# Patient Record
Sex: Female | Born: 1935 | Hispanic: No | Smoking: Never smoker
Health system: Southern US, Community
[De-identification: ages and names within clinical notes are randomized; demographics above are authoritative.]

## PROBLEM LIST (undated history)

## (undated) DIAGNOSIS — A159 Respiratory tuberculosis unspecified: Secondary | ICD-10-CM

## (undated) DIAGNOSIS — E7800 Pure hypercholesterolemia, unspecified: Secondary | ICD-10-CM

## (undated) DIAGNOSIS — R634 Abnormal weight loss: Secondary | ICD-10-CM

## (undated) DIAGNOSIS — F329 Major depressive disorder, single episode, unspecified: Secondary | ICD-10-CM

## (undated) DIAGNOSIS — E039 Hypothyroidism, unspecified: Secondary | ICD-10-CM

## (undated) DIAGNOSIS — U071 COVID-19: Secondary | ICD-10-CM

## (undated) DIAGNOSIS — C069 Malignant neoplasm of mouth, unspecified: Secondary | ICD-10-CM

## (undated) DIAGNOSIS — F32A Depression, unspecified: Secondary | ICD-10-CM

## (undated) DIAGNOSIS — M4800 Spinal stenosis, site unspecified: Secondary | ICD-10-CM

## (undated) DIAGNOSIS — I10 Essential (primary) hypertension: Secondary | ICD-10-CM

## (undated) HISTORY — PX: LIPOMA EXCISION: SHX5283

## (undated) HISTORY — PX: OTHER SURGICAL HISTORY: SHX169

## (undated) HISTORY — DX: Malignant neoplasm of mouth, unspecified: C06.9

## (undated) HISTORY — PX: TOOTH EXTRACTION: SUR596

## (undated) HISTORY — PX: COLONOSCOPY: SHX174

## (undated) HISTORY — PX: EYE SURGERY: SHX253

## (undated) HISTORY — PX: BACK SURGERY: SHX140

---

## 2000-01-05 ENCOUNTER — Encounter: Payer: Self-pay | Admitting: Obstetrics and Gynecology

## 2000-01-05 ENCOUNTER — Ambulatory Visit (HOSPITAL_COMMUNITY): Admission: RE | Admit: 2000-01-05 | Discharge: 2000-01-05 | Payer: Self-pay | Admitting: *Deleted

## 2000-01-08 ENCOUNTER — Ambulatory Visit (HOSPITAL_COMMUNITY): Admission: RE | Admit: 2000-01-08 | Discharge: 2000-01-08 | Payer: Self-pay | Admitting: Family Medicine

## 2000-01-08 ENCOUNTER — Encounter: Payer: Self-pay | Admitting: Family Medicine

## 2000-03-22 ENCOUNTER — Encounter: Payer: Self-pay | Admitting: Family Medicine

## 2000-03-22 ENCOUNTER — Ambulatory Visit (HOSPITAL_COMMUNITY): Admission: RE | Admit: 2000-03-22 | Discharge: 2000-03-22 | Payer: Self-pay | Admitting: *Deleted

## 2000-04-01 ENCOUNTER — Encounter: Admission: RE | Admit: 2000-04-01 | Discharge: 2000-04-01 | Payer: Self-pay | Admitting: Surgery

## 2000-04-01 ENCOUNTER — Encounter: Payer: Self-pay | Admitting: Surgery

## 2000-04-09 ENCOUNTER — Emergency Department (HOSPITAL_COMMUNITY): Admission: EM | Admit: 2000-04-09 | Discharge: 2000-04-09 | Payer: Self-pay

## 2000-04-27 ENCOUNTER — Ambulatory Visit (HOSPITAL_COMMUNITY): Admission: RE | Admit: 2000-04-27 | Discharge: 2000-04-27 | Payer: Self-pay | Admitting: Family Medicine

## 2000-04-27 ENCOUNTER — Encounter: Payer: Self-pay | Admitting: Family Medicine

## 2000-05-27 ENCOUNTER — Encounter: Payer: Self-pay | Admitting: Cardiovascular Disease

## 2000-05-27 ENCOUNTER — Encounter: Admission: RE | Admit: 2000-05-27 | Discharge: 2000-05-27 | Payer: Self-pay | Admitting: Cardiovascular Disease

## 2002-09-27 ENCOUNTER — Other Ambulatory Visit: Admission: RE | Admit: 2002-09-27 | Discharge: 2002-09-27 | Payer: Self-pay | Admitting: Family Medicine

## 2002-10-05 ENCOUNTER — Encounter: Payer: Self-pay | Admitting: Family Medicine

## 2002-10-05 ENCOUNTER — Encounter: Admission: RE | Admit: 2002-10-05 | Discharge: 2002-10-05 | Payer: Self-pay | Admitting: Family Medicine

## 2002-12-11 ENCOUNTER — Encounter: Payer: Self-pay | Admitting: Emergency Medicine

## 2002-12-11 ENCOUNTER — Emergency Department (HOSPITAL_COMMUNITY): Admission: EM | Admit: 2002-12-11 | Discharge: 2002-12-11 | Payer: Self-pay | Admitting: Emergency Medicine

## 2008-06-05 ENCOUNTER — Encounter: Admission: RE | Admit: 2008-06-05 | Discharge: 2008-06-05 | Payer: Self-pay | Admitting: Family Medicine

## 2008-08-26 ENCOUNTER — Emergency Department (HOSPITAL_COMMUNITY): Admission: EM | Admit: 2008-08-26 | Discharge: 2008-08-26 | Payer: Self-pay | Admitting: Emergency Medicine

## 2008-08-29 ENCOUNTER — Encounter (HOSPITAL_COMMUNITY): Admission: RE | Admit: 2008-08-29 | Discharge: 2008-11-27 | Payer: Self-pay | Admitting: Emergency Medicine

## 2009-02-11 ENCOUNTER — Encounter: Admission: RE | Admit: 2009-02-11 | Discharge: 2009-02-11 | Payer: Self-pay | Admitting: Neurology

## 2010-05-17 DIAGNOSIS — C069 Malignant neoplasm of mouth, unspecified: Secondary | ICD-10-CM

## 2010-05-17 HISTORY — DX: Malignant neoplasm of mouth, unspecified: C06.9

## 2011-06-18 ENCOUNTER — Emergency Department (HOSPITAL_COMMUNITY)
Admission: EM | Admit: 2011-06-18 | Discharge: 2011-06-19 | Disposition: A | Discharge status: Home / Self Care | Payer: Medicare Other | Attending: Emergency Medicine | Admitting: Emergency Medicine

## 2011-06-18 ENCOUNTER — Encounter (HOSPITAL_COMMUNITY): Payer: Self-pay | Admitting: *Deleted

## 2011-06-18 ENCOUNTER — Emergency Department (HOSPITAL_COMMUNITY): Payer: Medicare Other

## 2011-06-18 DIAGNOSIS — E119 Type 2 diabetes mellitus without complications: Secondary | ICD-10-CM | POA: Insufficient documentation

## 2011-06-18 DIAGNOSIS — M542 Cervicalgia: Secondary | ICD-10-CM | POA: Insufficient documentation

## 2011-06-18 DIAGNOSIS — W1809XA Striking against other object with subsequent fall, initial encounter: Secondary | ICD-10-CM | POA: Insufficient documentation

## 2011-06-18 DIAGNOSIS — S0003XA Contusion of scalp, initial encounter: Secondary | ICD-10-CM | POA: Insufficient documentation

## 2011-06-18 DIAGNOSIS — I10 Essential (primary) hypertension: Secondary | ICD-10-CM | POA: Insufficient documentation

## 2011-06-18 DIAGNOSIS — IMO0002 Reserved for concepts with insufficient information to code with codable children: Secondary | ICD-10-CM | POA: Insufficient documentation

## 2011-06-18 DIAGNOSIS — N390 Urinary tract infection, site not specified: Secondary | ICD-10-CM | POA: Insufficient documentation

## 2011-06-18 DIAGNOSIS — E7800 Pure hypercholesterolemia, unspecified: Secondary | ICD-10-CM | POA: Insufficient documentation

## 2011-06-18 DIAGNOSIS — Z79899 Other long term (current) drug therapy: Secondary | ICD-10-CM | POA: Insufficient documentation

## 2011-06-18 DIAGNOSIS — R51 Headache: Secondary | ICD-10-CM | POA: Insufficient documentation

## 2011-06-18 HISTORY — DX: Pure hypercholesterolemia, unspecified: E78.00

## 2011-06-18 HISTORY — DX: Essential (primary) hypertension: I10

## 2011-06-18 NOTE — ED Notes (Signed)
Bed:WA20<BR> Expected date:<BR> Expected time:<BR> Means of arrival:<BR> Comments:<BR> Hold for triage 1

## 2011-06-18 NOTE — ED Notes (Signed)
Patient aware of need for urine testing. Patient unable to void at this time. Patient encouraged to call for toileting assistance Patient is in pain.

## 2011-06-18 NOTE — ED Notes (Signed)
Family at bedside. 

## 2011-06-18 NOTE — ED Notes (Signed)
Pt fell on way to the bathroom at home. Pt does not remember falling. Pt struck top of head on skirt board and cut L elbow on heating grate. No n/v reported. Positive loss of consciousness reported.

## 2011-06-19 ENCOUNTER — Other Ambulatory Visit: Payer: Self-pay

## 2011-06-19 LAB — URINALYSIS, ROUTINE W REFLEX MICROSCOPIC
Hgb urine dipstick: NEGATIVE
Specific Gravity, Urine: 1.025 (ref 1.005–1.030)
Urobilinogen, UA: 0.2 mg/dL (ref 0.0–1.0)

## 2011-06-19 LAB — BASIC METABOLIC PANEL
CO2: 26 mEq/L (ref 19–32)
Chloride: 101 mEq/L (ref 96–112)
Glucose, Bld: 204 mg/dL — ABNORMAL HIGH (ref 70–99)
Potassium: 4.4 mEq/L (ref 3.5–5.1)
Sodium: 138 mEq/L (ref 135–145)

## 2011-06-19 LAB — DIFFERENTIAL
Basophils Absolute: 0 10*3/uL (ref 0.0–0.1)
Eosinophils Relative: 9 % — ABNORMAL HIGH (ref 0–5)
Lymphocytes Relative: 31 % (ref 12–46)
Lymphs Abs: 3.4 10*3/uL (ref 0.7–4.0)
Neutro Abs: 6 10*3/uL (ref 1.7–7.7)
Neutrophils Relative %: 54 % (ref 43–77)

## 2011-06-19 LAB — CBC
MCV: 82.2 fL (ref 78.0–100.0)
Platelets: 274 10*3/uL (ref 150–400)
RBC: 3.99 MIL/uL (ref 3.87–5.11)
WBC: 11.2 10*3/uL — ABNORMAL HIGH (ref 4.0–10.5)

## 2011-06-19 MED ORDER — CIPROFLOXACIN HCL 500 MG PO TABS
500.0000 mg | ORAL_TABLET | Freq: Once | ORAL | Status: AC
  Administered 2011-06-19: 500 mg via ORAL
  Filled 2011-06-19: qty 1

## 2011-06-19 MED ORDER — PREDNISONE 20 MG PO TABS: 60.0000 mg | ORAL_TABLET | Freq: Once | ORAL | Status: DC

## 2011-06-19 MED ORDER — PREDNISONE 20 MG PO TABS
ORAL_TABLET | ORAL | Status: AC
  Filled 2011-06-19: qty 3

## 2011-06-19 MED ORDER — CIPROFLOXACIN HCL 500 MG PO TABS: 500.0000 mg | ORAL_TABLET | Freq: Two times a day (BID) | ORAL | Status: AC

## 2011-06-19 MED ORDER — HYDROCODONE-ACETAMINOPHEN 5-325 MG PO TABS
1.0000 | ORAL_TABLET | Freq: Once | ORAL | Status: AC
  Administered 2011-06-19: 1 via ORAL
  Filled 2011-06-19: qty 1

## 2011-06-19 NOTE — ED Provider Notes (Signed)
History     CSN: [id number]  Arrival date & time 06/18/11  2149   First MD Initiated Contact with Patient 06/18/11 2257      Chief Complaint  Patient presents with  . Fall  . Head Injury    positive LoC    (Consider location/radiation/quality/duration/timing/severity/associated sxs/prior treatment) HPI Hannah [name] is a 78 y.o. female presents with c/o fall leading to desire to be assessed in the ED. The sx(s) have been present for 1 hour. Additional concerns are headache, neck pain, soreness of shoulders, and laceration of the left elbow. Causative factors are not known. Palliative factors are nothing was tried. The distress associated is moderate. The disorder has been present for 1 hour.    Past Medical History  Diagnosis Date  . Diabetes mellitus   . Hypertension   . Hypercholesteremia     History reviewed. No pertinent past surgical history.  History reviewed. No pertinent family history.  History  Substance Use Topics  . Smoking status: Never Smoker   . Smokeless tobacco: Not on file  . Alcohol Use: No    OB History    Grav Para Term Preterm Abortions TAB SAB Ect Mult Living                  Review of Systems  All other systems reviewed and are negative.    Allergies  Penicillins and Sulfa antibiotics  Home Medications   Current Outpatient Rx  Name Route Sig Dispense Refill  . AMLODIPINE BESYLATE 5 MG PO TABS Oral Take 5 mg by mouth daily.    . ASPIRIN EC 81 MG PO TBEC Oral Take 81 mg by mouth daily.    Marland Kitchen EZETIMIBE-SIMVASTATIN 10-20 MG PO TABS Oral Take 1 tablet by mouth at bedtime.    Marland Kitchen METFORMIN HCL 500 MG PO TABS Oral Take 500 mg by mouth 2 (two) times daily with a meal.    . CIPROFLOXACIN HCL 500 MG PO TABS Oral Take 1 tablet (500 mg total) by mouth every 12 (twelve) hours. 10 tablet 0    BP 126/72  Pulse 72  Temp(Src) 98.2 F (36.8 C) (Oral)  Resp 20  SpO2 97%  Physical Exam  Nursing note and vitals reviewed. Constitutional: She  is oriented to person, place, and time. She appears well-developed and well-nourished.  HENT:  Head: Normocephalic and atraumatic.       Small left parietal contusion with very minimal nonbleeding abrasion.  Eyes: Conjunctivae and EOM are normal. Pupils are equal, round, and reactive to light.  Neck: Normal range of motion and phonation normal. Neck supple.  Cardiovascular: Normal rate, regular rhythm and intact distal pulses.   Pulmonary/Chest: Effort normal and breath sounds normal. She has no rales. She exhibits no tenderness.  Abdominal: Soft. She exhibits no distension. There is no tenderness. There is no guarding.  Musculoskeletal: Normal range of motion. She exhibits no edema and no tenderness.       Small abrasion, left elbow, nonbleeding. Bilateral posterior shoulder, tenderness, mild- no deformities  Neurological: She is alert and oriented to person, place, and time. She has normal strength. No cranial nerve deficit. She exhibits normal muscle tone. Coordination normal.  Skin: Skin is warm and dry.  Psychiatric: She has a normal mood and affect. Her behavior is normal. Judgment and thought content normal.    ED Course  Procedures (including critical care time)  Date: 06/19/2011  Rate: 69  Rhythm: normal sinus rhythm  QRS Axis: normal  Intervals: normal  ST/T Wave abnormalities: nonspecific T wave changes  Conduction Disutrbances:none  Narrative Interpretation:   Old EKG Reviewed: unchanged  Labs Reviewed  URINALYSIS, ROUTINE W REFLEX MICROSCOPIC - Abnormal; Notable for the following:    APPearance CLOUDY (*)    Glucose, UA 100 (*)    Leukocytes, UA MODERATE (*)    All other components within normal limits  CBC - Abnormal; Notable for the following:    WBC 11.2 (*)    Hemoglobin 10.8 (*)    HCT 32.8 (*)    All other components within normal limits  DIFFERENTIAL - Abnormal; Notable for the following:    Eosinophils Relative 9 (*)    Eosinophils Absolute 1.0 (*)     All other components within normal limits  BASIC METABOLIC PANEL - Abnormal; Notable for the following:    Glucose, Bld 204 (*)    GFR calc non Af Amer 85 (*)    All other components within normal limits  URINE MICROSCOPIC-ADD ON - Abnormal; Notable for the following:    Squamous Epithelial / LPF FEW (*)    Bacteria, UA FEW (*)    All other components within normal limits  URINE CULTURE   Ct Head Wo Contrast  06/18/2011  *RADIOLOGY REPORT*  Clinical Data: Head and neck pain secondary to a fall today.  Loss of consciousness.  CT HEAD WITHOUT CONTRAST  Technique:  Contiguous axial images were obtained from the base of the skull through the vertex without contrast.  Comparison: 07/14/2006  Findings: There is no acute intracranial infarction, hemorrhage, or mass.  No significant atrophy.  Osseous structures are normal. Ventricles are not dilated.  IMPRESSION: Normal exam.  Original Report Authenticated By: Gwynn Burly, M.D.   Ct Cervical Spine Wo Contrast  06/18/2011  *RADIOLOGY REPORT*  Clinical Data: Neck pain secondary to a fall today.  CT CERVICAL SPINE WITHOUT CONTRAST  Technique:  Multidetector CT imaging of the cervical spine was performed. Multiplanar CT image reconstructions were also generated.  Comparison: None.  Findings: There is no fracture, subluxation, prevertebral soft tissue swelling, or other acute abnormality.  Multilevel mild degenerative disc disease.  IMPRESSION: No acute abnormalities of the cervical spine.  Original Report Authenticated By: Gwynn Burly, M.D.   0200-  Reevaluation with update and discussion. After initial assessment and treatment, an updated evaluation reveals Collar removed. Good ROM neck.Mancel Bale L    ED Treatment: Cipro PO  1. UTI (lower urinary tract infection)       MDM  Fall with head injury and apparent UTI. Pt stable for d/c. Doubt ACS, metabolic instability, SBI.      Flint Melter, MD 06/19/11 [phone]

## 2011-06-20 LAB — URINE CULTURE
Colony Count: 35000
Culture  Setup Time: 201302020406

## 2011-06-21 NOTE — ED Notes (Signed)
Chart sent to EDP office for review °

## 2011-06-23 NOTE — ED Notes (Signed)
Chart reviewed by  Fayrene Helper and he request rx for  Cipro treatment and follow up w/ PCP to ensure resolution of UTI

## 2011-06-23 NOTE — ED Notes (Signed)
Patient notified. Talked with Patient's son after getting consent.

## 2012-03-28 ENCOUNTER — Other Ambulatory Visit: Payer: Self-pay | Admitting: Otolaryngology

## 2012-03-28 DIAGNOSIS — IMO0002 Reserved for concepts with insufficient information to code with codable children: Secondary | ICD-10-CM

## 2012-03-29 ENCOUNTER — Ambulatory Visit
Admission: RE | Admit: 2012-03-29 | Discharge: 2012-03-29 | Disposition: A | Discharge status: Home / Self Care | Payer: Medicare Other | Source: Ambulatory Visit | Attending: Otolaryngology | Admitting: Otolaryngology

## 2012-03-29 DIAGNOSIS — IMO0002 Reserved for concepts with insufficient information to code with codable children: Secondary | ICD-10-CM

## 2012-03-29 MED ORDER — IOHEXOL 300 MG/ML  SOLN
75.0000 mL | Freq: Once | INTRAMUSCULAR | Status: AC | PRN
  Administered 2012-03-29: 75 mL via INTRAVENOUS

## 2012-03-30 ENCOUNTER — Ambulatory Visit
Admission: RE | Admit: 2012-03-30 | Discharge: 2012-03-30 | Disposition: A | Discharge status: Home / Self Care | Payer: Medicare Other | Source: Ambulatory Visit | Attending: Neurology | Admitting: Neurology

## 2012-03-30 ENCOUNTER — Other Ambulatory Visit: Payer: Self-pay | Admitting: Neurology

## 2012-03-30 DIAGNOSIS — R42 Dizziness and giddiness: Secondary | ICD-10-CM

## 2012-03-30 MED ORDER — GADOBENATE DIMEGLUMINE 529 MG/ML IV SOLN
9.0000 mL | Freq: Once | INTRAVENOUS | Status: AC | PRN
  Administered 2012-03-30: 9 mL via INTRAVENOUS

## 2012-09-27 ENCOUNTER — Emergency Department (HOSPITAL_COMMUNITY): Payer: Medicare Other

## 2012-09-27 ENCOUNTER — Emergency Department (HOSPITAL_COMMUNITY)
Admission: EM | Admit: 2012-09-27 | Discharge: 2012-09-27 | Disposition: A | Discharge status: Home / Self Care | Payer: Medicare Other | Attending: Emergency Medicine | Admitting: Emergency Medicine

## 2012-09-27 DIAGNOSIS — Z862 Personal history of diseases of the blood and blood-forming organs and certain disorders involving the immune mechanism: Secondary | ICD-10-CM | POA: Insufficient documentation

## 2012-09-27 DIAGNOSIS — R638 Other symptoms and signs concerning food and fluid intake: Secondary | ICD-10-CM

## 2012-09-27 DIAGNOSIS — Z85828 Personal history of other malignant neoplasm of skin: Secondary | ICD-10-CM | POA: Insufficient documentation

## 2012-09-27 DIAGNOSIS — Z79899 Other long term (current) drug therapy: Secondary | ICD-10-CM | POA: Insufficient documentation

## 2012-09-27 DIAGNOSIS — E162 Hypoglycemia, unspecified: Secondary | ICD-10-CM

## 2012-09-27 DIAGNOSIS — E1169 Type 2 diabetes mellitus with other specified complication: Secondary | ICD-10-CM | POA: Insufficient documentation

## 2012-09-27 DIAGNOSIS — E860 Dehydration: Secondary | ICD-10-CM | POA: Insufficient documentation

## 2012-09-27 DIAGNOSIS — Z8639 Personal history of other endocrine, nutritional and metabolic disease: Secondary | ICD-10-CM | POA: Insufficient documentation

## 2012-09-27 DIAGNOSIS — R42 Dizziness and giddiness: Secondary | ICD-10-CM | POA: Insufficient documentation

## 2012-09-27 DIAGNOSIS — Z7982 Long term (current) use of aspirin: Secondary | ICD-10-CM | POA: Insufficient documentation

## 2012-09-27 DIAGNOSIS — I10 Essential (primary) hypertension: Secondary | ICD-10-CM | POA: Insufficient documentation

## 2012-09-27 DIAGNOSIS — Z880 Allergy status to penicillin: Secondary | ICD-10-CM | POA: Insufficient documentation

## 2012-09-27 LAB — CBC
MCH: 26.5 pg (ref 26.0–34.0)
Platelets: 267 10*3/uL (ref 150–400)
RBC: 4.56 MIL/uL (ref 3.87–5.11)
WBC: 6.1 10*3/uL (ref 4.0–10.5)

## 2012-09-27 LAB — URINALYSIS, ROUTINE W REFLEX MICROSCOPIC
Ketones, ur: NEGATIVE mg/dL
Nitrite: NEGATIVE
pH: 5.5 (ref 5.0–8.0)

## 2012-09-27 LAB — GLUCOSE, CAPILLARY
Glucose-Capillary: 50 mg/dL — ABNORMAL LOW (ref 70–99)
Glucose-Capillary: 75 mg/dL (ref 70–99)

## 2012-09-27 LAB — BASIC METABOLIC PANEL
CO2: 26 mEq/L (ref 19–32)
Calcium: 9.9 mg/dL (ref 8.4–10.5)
Chloride: 104 mEq/L (ref 96–112)
Sodium: 140 mEq/L (ref 135–145)

## 2012-09-27 LAB — URINE MICROSCOPIC-ADD ON

## 2012-09-27 MED ORDER — SODIUM CHLORIDE 0.9 % IV BOLUS (SEPSIS)
1000.0000 mL | Freq: Once | INTRAVENOUS | Status: AC
  Administered 2012-09-27: 1000 mL via INTRAVENOUS

## 2012-09-27 MED ORDER — DEXTROSE 50 % IV SOLN
INTRAVENOUS | Status: AC
  Administered 2012-09-27: 50 mL
  Filled 2012-09-27: qty 50

## 2012-09-27 NOTE — ED Notes (Addendum)
Notified lab of urine cx- lab tech already decided to run a cx on it. Per Dahlia Client, [place]- that is okay.  Dahlia Client, [place] is okay to DC troponin, as well.

## 2012-09-27 NOTE — ED Notes (Signed)
Pt hx of oral cancer and skin grafts. Family reports pt usually does not leave the house, had pt out for 6 hours on Sunday. Pt became very tired and weak. Pt has not been eating properly and losing lots of weight. C/o blurry vision, weakness, and dizziness.

## 2012-09-27 NOTE — ED Notes (Signed)
50 CBG

## 2012-09-27 NOTE — ED Provider Notes (Signed)
History     CSN: [id number]  Arrival date & time 09/27/12  1539   First MD Initiated Contact with Patient 09/27/12 1610      Chief Complaint  Patient presents with  . cancer pt   . Dizziness  . Blurred Vision    (Consider location/radiation/quality/duration/timing/severity/associated sxs/prior treatment) The history is provided by the patient and medical records. No language interpreter was used.    Hannah [name] is a 79 y.o. 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  Patient also states she will this morning with blurred vision and fatigue.  Daughter states patient has had limited by mouth intake secondary to the sores in her mouth and has a recent history of a hydration and malnutrition for which she has been too [facility] many times for rehydration therapy.  Pt oncologist is Dr Manson Passey at [facility] and PCP is Deboraha Sprang.  Patient contacted [facility] family physicians this morning he recommended that she come here to the emergency department for evaluation and rehydration.   Associated symptoms include anorexia.  Nothing makes it better or worse at this time.  Pt denies fever, chills, headache, neck pain chest pain, shortness of breath, abdominal pain, nausea, vomiting, diarrhea, syncope, dysuria, hematuria.     Past Medical History  Diagnosis Date  . Diabetes mellitus   . Hypertension   . Hypercholesteremia     No past surgical history on file.  No family history on file.  History  Substance Use Topics  . Smoking status: Never Smoker   . Smokeless tobacco: Not on file  . Alcohol Use: No    OB History   Grav Para Term Preterm Abortions TAB SAB Ect Mult Living                  Review of Systems  Constitutional: Positive for appetite change.  Negative for fever, diaphoresis, fatigue and unexpected weight change.  HENT: Negative for mouth sores and neck stiffness.   Eyes: Positive for visual disturbance.  Respiratory: Negative for cough, chest tightness, shortness of breath and wheezing.   Cardiovascular: Negative for chest pain.  Gastrointestinal: Negative for nausea, vomiting, abdominal pain, diarrhea and constipation.  Endocrine: Negative for polydipsia, polyphagia and polyuria.  Genitourinary: Positive for decreased urine volume. Negative for dysuria, urgency, frequency and hematuria.  Musculoskeletal: Negative for back pain.  Skin: Negative for rash.  Allergic/Immunologic: Negative for immunocompromised state.  Neurological: Positive for weakness. Negative for syncope, light-headedness and headaches.  Hematological: Does not bruise/bleed easily.  Psychiatric/Behavioral: Negative for sleep disturbance. The patient is not nervous/anxious.     Allergies  Penicillins and Sulfa antibiotics  Home Medications   Current Outpatient Rx  Name  Route  Sig  Dispense  Refill  . Alum & Mag Hydroxide-Simeth (MAGIC MOUTHWASH) SOLN   Oral   Take 5 mLs by mouth daily as needed (mouth sores).         Marland Kitchen amLODipine (NORVASC) 5 MG tablet   Oral   Take 5 mg by mouth daily.         Marland Kitchen aspirin EC 81 MG tablet   Oral   Take 81 mg by mouth daily.         Marland Kitchen ezetimibe-simvastatin (VYTORIN) 10-20 MG per tablet   Oral   Take 1 tablet by mouth  at bedtime.         . metFORMIN (GLUCOPHAGE) 500 MG tablet   Oral   Take 500 mg by mouth 2 (two) times daily with a meal.           BP 122/56  Pulse 76  Temp(Src) 97.7 F (36.5 C) (Oral)  Resp 17  Ht 5\' 2"  (1.575 m)  Wt 82 lb 7.2 oz (37.4 kg)  BMI 15.08 kg/m2  SpO2 100%  Physical Exam  Nursing note and vitals reviewed. Constitutional: She is oriented to person, place, and time. She appears well-developed. No distress.  Cachectic  HENT:  Head: Normocephalic and atraumatic.   Right Ear: Tympanic membrane, external ear and ear canal normal.  Left Ear: Tympanic membrane, external ear and ear canal normal.  Nose: Nose normal. No mucosal edema.  Mouth/Throat: Uvula is midline and oropharynx is clear and moist. Mucous membranes are dry and not cyanotic. No oropharyngeal exudate, posterior oropharyngeal edema or tonsillar abscesses.  Dry mucous membranes   Eyes: Conjunctivae and EOM are normal. Pupils are equal, round, and reactive to light. Right conjunctiva is not injected. Right conjunctiva has no hemorrhage. Left conjunctiva is not injected. Left conjunctiva has no hemorrhage. No scleral icterus.  Fundoscopic exam:      The right eye shows no arteriolar narrowing, no AV nicking, no exudate, no hemorrhage and no papilledema.       The left eye shows no arteriolar narrowing, no AV nicking, no exudate, no hemorrhage and no papilledema.  Neck: Normal range of motion. Neck supple.  Cardiovascular: Normal rate, regular rhythm, normal heart sounds and intact distal pulses.   No murmur heard. Pulmonary/Chest: Effort normal and breath sounds normal. No respiratory distress. She has no wheezes. She has no rales. She exhibits no tenderness.  Abdominal: Soft. Bowel sounds are normal. She exhibits no distension and no mass. There is no tenderness. There is no rebound and no guarding.  Musculoskeletal: Normal range of motion. She exhibits no edema and no tenderness.  Lymphadenopathy:    She has no cervical adenopathy.  Neurological: She is alert and oriented to person, place, and time. She exhibits normal muscle tone. Coordination normal. GCS eye subscore is 4. GCS verbal subscore is 5. GCS motor subscore is 6.  Speech is clear and goal oriented, follows commands Major Cranial nerves without deficit, no facial droop Normal strength in upper and lower extremities bilaterally including dorsiflexion and plantar flexion, strong and equal grip strength Sensation normal to light and  sharp touch Moves extremities without ataxia, coordination intact Normal finger to nose and rapid alternating movements Normal gait and balance  Skin: Skin is warm and dry. No rash noted. She is not diaphoretic. No erythema.  Psychiatric: She has a normal mood and affect.    ED Course  Procedures (including critical care time)  Labs Reviewed  BASIC METABOLIC PANEL - Abnormal; Notable for the following:    Glucose, Bld 43 (*)    GFR calc non Af Amer 85 (*)    All other components within normal limits  GLUCOSE, CAPILLARY - Abnormal; Notable for the following:    Glucose-Capillary 50 (*)    All other components within normal limits  URINALYSIS, ROUTINE W REFLEX MICROSCOPIC - Abnormal; Notable for the following:    Glucose, UA >1000 (*)    Leukocytes, UA MODERATE (*)    All other components within normal limits  GLUCOSE, CAPILLARY - Abnormal; Notable for the following:    Glucose-Capillary 176 (*)  All other components within normal limits  URINE MICROSCOPIC-ADD ON - Abnormal; Notable for the following:    Bacteria, UA FEW (*)    All other components within normal limits  URINE CULTURE  CBC   Dg Chest 2 View  09/27/2012   *RADIOLOGY REPORT*  Clinical Data: Weakness  CHEST - 2 VIEW  Comparison: CT chest dated 08/24/2007  Findings: Hyperinflation.  No focal consolidation.  No pleural effusion or pneumothorax.  The heart is normal in size.  Mild degenerative changes of the visualized thoracolumbar spine.  IMPRESSION: No evidence of acute cardiopulmonary disease.   Original Report Authenticated By: Charline Bills, M.D.   ECG:  Date: 09/27/2012  Rate: 67  Rhythm: normal sinus rhythm  QRS Axis: normal  Intervals: normal  ST/T Wave abnormalities: nonspecific T wave changes  Conduction Disutrbances:none  Narrative Interpretation: inverted T waves V1-2, unchanged from previous ECG  Old EKG Reviewed: unchanged    1. Dehydration   2. Decreased oral intake   3. Hypoglycemia        MDM  Gennette Pac presents with generalized weakness, blurred vision and decreased by mouth intake for several days. On arrival patient blood sugar 50. IV initiated and D50 given.  Patient states after administration of D50 her vision cleared and she felt much less fatigued. Patient given fluid bolus of normal saline 2 L.   CBC, BMP unremarkable, urinalysis without evidence of urinary tract infection. Repeat glucose 176. Patient tolerating fluids by mouth without difficulty. Discussed the need to continue to eat and drink while taking diabetes medications. Chest x-ray without evidence of pneumonia, urinalysis without evidence of urinary tract infection, ECG nonischemic, no neurologic deficit, patient walks without difficulty.  Patient and daughter to have patient followup with her primary care physician this week for further evaluation.  I have also discussed reasons to return immediately to the ER.  Patient expresses understanding and agrees with plan.        Dierdre Forth, PA-C 09/27/12 1905

## 2012-09-27 NOTE — ED Notes (Signed)
Pt is tolerating PO challenge--> apple juice.

## 2012-09-27 NOTE — ED Notes (Addendum)
Glucose of 43 (BMET lab) resulted after pt had been tx'd with D50.  Pt is alert and talking.

## 2012-09-27 NOTE — ED Provider Notes (Signed)
  Medical screening examination/treatment/procedure(s) were performed by non-physician practitioner and as supervising physician I was immediately available for consultation/collaboration.  On my exam the patient was in no distress.  Given her, and her daughters concerns with decreased energy, or some suspicion of infection versus electrolyte abnormalities and/or dehydration.  I saw the ECG (if appropriate), relevant labs and studies - I agree with the interpretation.    Gerhard Munch, MD 09/27/12 [phone]

## 2012-09-27 NOTE — ED Notes (Signed)
75 cbg

## 2012-09-27 NOTE — ED Notes (Signed)
cbg 167 

## 2012-09-28 LAB — URINE CULTURE
Colony Count: NO GROWTH
Culture: NO GROWTH

## 2014-05-27 DIAGNOSIS — K120 Recurrent oral aphthae: Secondary | ICD-10-CM | POA: Diagnosis not present

## 2014-05-27 DIAGNOSIS — C069 Malignant neoplasm of mouth, unspecified: Secondary | ICD-10-CM | POA: Diagnosis not present

## 2014-06-03 DIAGNOSIS — Z1389 Encounter for screening for other disorder: Secondary | ICD-10-CM | POA: Diagnosis not present

## 2014-06-03 DIAGNOSIS — E785 Hyperlipidemia, unspecified: Secondary | ICD-10-CM | POA: Diagnosis not present

## 2014-06-03 DIAGNOSIS — I129 Hypertensive chronic kidney disease with stage 1 through stage 4 chronic kidney disease, or unspecified chronic kidney disease: Secondary | ICD-10-CM | POA: Diagnosis not present

## 2014-06-03 DIAGNOSIS — E440 Moderate protein-calorie malnutrition: Secondary | ICD-10-CM | POA: Diagnosis not present

## 2014-06-03 DIAGNOSIS — Z23 Encounter for immunization: Secondary | ICD-10-CM | POA: Diagnosis not present

## 2014-06-03 DIAGNOSIS — E1122 Type 2 diabetes mellitus with diabetic chronic kidney disease: Secondary | ICD-10-CM | POA: Diagnosis not present

## 2014-06-03 DIAGNOSIS — N182 Chronic kidney disease, stage 2 (mild): Secondary | ICD-10-CM | POA: Diagnosis not present

## 2014-06-28 DIAGNOSIS — C060 Malignant neoplasm of cheek mucosa: Secondary | ICD-10-CM | POA: Diagnosis not present

## 2014-06-28 DIAGNOSIS — R51 Headache: Secondary | ICD-10-CM | POA: Diagnosis not present

## 2014-07-07 DIAGNOSIS — R51 Headache: Secondary | ICD-10-CM | POA: Diagnosis not present

## 2014-09-07 DIAGNOSIS — J45909 Unspecified asthma, uncomplicated: Secondary | ICD-10-CM | POA: Diagnosis not present

## 2014-09-20 DIAGNOSIS — C060 Malignant neoplasm of cheek mucosa: Secondary | ICD-10-CM | POA: Diagnosis not present

## 2014-09-20 DIAGNOSIS — C069 Malignant neoplasm of mouth, unspecified: Secondary | ICD-10-CM | POA: Diagnosis not present

## 2014-10-21 DIAGNOSIS — Z08 Encounter for follow-up examination after completed treatment for malignant neoplasm: Secondary | ICD-10-CM | POA: Diagnosis not present

## 2014-10-21 DIAGNOSIS — Z923 Personal history of irradiation: Secondary | ICD-10-CM | POA: Diagnosis not present

## 2014-10-21 DIAGNOSIS — Z85819 Personal history of malignant neoplasm of unspecified site of lip, oral cavity, and pharynx: Secondary | ICD-10-CM | POA: Diagnosis not present

## 2014-10-21 DIAGNOSIS — R208 Other disturbances of skin sensation: Secondary | ICD-10-CM | POA: Diagnosis not present

## 2014-10-21 DIAGNOSIS — C069 Malignant neoplasm of mouth, unspecified: Secondary | ICD-10-CM | POA: Diagnosis not present

## 2014-11-24 DIAGNOSIS — S30861A Insect bite (nonvenomous) of abdominal wall, initial encounter: Secondary | ICD-10-CM | POA: Diagnosis not present

## 2014-11-24 DIAGNOSIS — W57XXXA Bitten or stung by nonvenomous insect and other nonvenomous arthropods, initial encounter: Secondary | ICD-10-CM | POA: Diagnosis not present

## 2015-02-24 DIAGNOSIS — I129 Hypertensive chronic kidney disease with stage 1 through stage 4 chronic kidney disease, or unspecified chronic kidney disease: Secondary | ICD-10-CM | POA: Diagnosis not present

## 2015-02-24 DIAGNOSIS — N182 Chronic kidney disease, stage 2 (mild): Secondary | ICD-10-CM | POA: Diagnosis not present

## 2015-02-24 DIAGNOSIS — Z23 Encounter for immunization: Secondary | ICD-10-CM | POA: Diagnosis not present

## 2015-02-24 DIAGNOSIS — E440 Moderate protein-calorie malnutrition: Secondary | ICD-10-CM | POA: Diagnosis not present

## 2015-02-24 DIAGNOSIS — E785 Hyperlipidemia, unspecified: Secondary | ICD-10-CM | POA: Diagnosis not present

## 2015-02-24 DIAGNOSIS — E1122 Type 2 diabetes mellitus with diabetic chronic kidney disease: Secondary | ICD-10-CM | POA: Diagnosis not present

## 2015-03-24 DIAGNOSIS — C069 Malignant neoplasm of mouth, unspecified: Secondary | ICD-10-CM | POA: Diagnosis not present

## 2015-03-24 DIAGNOSIS — N182 Chronic kidney disease, stage 2 (mild): Secondary | ICD-10-CM | POA: Diagnosis not present

## 2015-03-24 DIAGNOSIS — E1122 Type 2 diabetes mellitus with diabetic chronic kidney disease: Secondary | ICD-10-CM | POA: Diagnosis not present

## 2015-03-24 DIAGNOSIS — R51 Headache: Secondary | ICD-10-CM | POA: Diagnosis not present

## 2015-03-24 DIAGNOSIS — I10 Essential (primary) hypertension: Secondary | ICD-10-CM | POA: Diagnosis not present

## 2015-05-09 ENCOUNTER — Ambulatory Visit (INDEPENDENT_AMBULATORY_CARE_PROVIDER_SITE_OTHER): Payer: Commercial Managed Care - HMO | Admitting: Neurology

## 2015-05-09 ENCOUNTER — Other Ambulatory Visit (INDEPENDENT_AMBULATORY_CARE_PROVIDER_SITE_OTHER): Payer: Commercial Managed Care - HMO

## 2015-05-09 ENCOUNTER — Encounter: Payer: Self-pay | Admitting: Neurology

## 2015-05-09 VITALS — BP 110/60 | HR 66 | Ht 61.0 in | Wt 89.0 lb

## 2015-05-09 DIAGNOSIS — E46 Unspecified protein-calorie malnutrition: Secondary | ICD-10-CM

## 2015-05-09 DIAGNOSIS — R51 Headache: Secondary | ICD-10-CM

## 2015-05-09 DIAGNOSIS — R519 Headache, unspecified: Secondary | ICD-10-CM

## 2015-05-09 DIAGNOSIS — G4486 Cervicogenic headache: Secondary | ICD-10-CM

## 2015-05-09 DIAGNOSIS — C801 Malignant (primary) neoplasm, unspecified: Secondary | ICD-10-CM | POA: Diagnosis not present

## 2015-05-09 LAB — VITAMIN D 25 HYDROXY (VIT D DEFICIENCY, FRACTURES): VITD: 19.72 ng/mL — AB (ref 30.00–100.00)

## 2015-05-09 LAB — VITAMIN B12: VITAMIN B 12: 297 pg/mL (ref 211–911)

## 2015-05-09 LAB — TSH: TSH: 4.67 u[IU]/mL — AB (ref 0.35–4.50)

## 2015-05-09 NOTE — Progress Notes (Signed)
[facility] Clinic Note - Initial Visit   Date: 05/09/2015  Hannah [name] MRN: [id number] DOB: [birth year]   Dear Dr. Nancy Fetter:  Thank you for your kind referral of Hannah [name] for consultation of headaches. Although her history is well known to you, please allow [place] to reiterate it for the purpose of our medical record. The patient was accompanied to the clinic by daughter who also provides collateral information.     History of Present Illness: Hannah [name] is a 81 y.o. 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    She reports having headaches from prior 2012 and was seeing Dr. Leonie Man who ordered MRI brain and recommended neck PT, but because of newly diagnosed oral cancer around the same time, cancer management took precedent.  She reports having intermittent headaches over the past several years.  Headaches have improved frequency.  She now gets a headache about 1-2 times per month every 2-3 months.  Her last headaches was 15 days ago.  Pain is localized at the top and associated with dizziness.  She rarely has nausea or vomiting, photophobia, or phonophonia.  Daughter has migraines.  She has passed out three times, which has been unwitinessed.  She does not tell anyone about it, but endorses preceding headache and if she rests, she can avoid passing out.  Headaches are associated with dizziness and syncope.  Severity of pain has been worsening and during this time, she is incapacitated and unable to do anything, so she rests and sleeps for most of the day.  She has relief with ibuprofen, but takes this only at bedtime not when headache is severe. She does not tolerate tylenol and is very sensitive to medications.  She endorses neck stiffness.  There is on  associated double vision, worsening pain with laying flat, morning headaches, or hearing problems.  She is extremely pleased that her weight is up by 9lb today.  Daughter states that she recently was fitted for new dentures, which has increased her solid food intake.  Out-side paper records, electronic medical record, and images have been reviewed where available and summarized as:  MRI brain wwo contrast and IAC 04/25/2012:  MRI brain is normal except there is a small enhancing region involving the intracanalicular portion of the right vestibular nerve which may be inflammatory lesion following infection.  Repeat imaging recommended ing 3-6 months.     Past Medical History  Diagnosis Date  . Diabetes mellitus   . Hypertension   . Hypercholesteremia   . Oral cancer [facility])     Past Surgical History  Procedure Laterality Date  . Other surgical history      squamous cell removal;skin graft     Medications:  Outpatient Encounter Prescriptions as of 05/09/2015  Medication Sig  . amLODipine (NORVASC) 5 MG tablet Take 5 mg by mouth daily.  Marland Kitchen aspirin EC 81 MG tablet Take 81 mg by mouth daily.  Marland Kitchen ezetimibe-simvastatin (VYTORIN) 10-20 MG per tablet Take 1 tablet by mouth at bedtime.  . [DISCONTINUED] Alum & Mag Hydroxide-Simeth (MAGIC MOUTHWASH) SOLN Take 5 mLs by mouth daily as needed (mouth sores).  . [DISCONTINUED] metFORMIN (GLUCOPHAGE) 500 MG tablet Take 500 mg by mouth 2 (two) times daily with a meal.   No facility-administered encounter medications on file as of 05/09/2015.  Allergies:  Allergies  Allergen Reactions  . Penicillins     Unknown   . Sulfa Antibiotics     Unknown     Family History: Family History  Problem Relation Age of Onset  . Arthritis Mother     Social History: Social History  Substance Use Topics  . Smoking status: Never Smoker   . Smokeless tobacco: Never Used  . Alcohol Use: No   Social History   Social History Narrative    Review  of Systems:  CONSTITUTIONAL: No fevers, chills, night sweats, or weight loss.   EYES: No visual changes or eye pain ENT: No hearing changes.  No history of nose bleeds.   RESPIRATORY: No cough, wheezing and shortness of breath.   CARDIOVASCULAR: Negative for chest pain, and palpitations.   GI: Negative for abdominal discomfort, blood in stools or black stools.  No recent change in bowel habits.   GU:  No history of incontinence.   MUSCLOSKELETAL: No history of joint pain or swelling.  No myalgias.   SKIN: Negative for lesions, rash, and itching.   HEMATOLOGY/ONCOLOGY: Negative for prolonged bleeding, bruising easily, and swollen nodes.  +history of cancer.   ENDOCRINE: Negative for cold or heat intolerance, polydipsia or goiter.   PSYCH:  No depression or anxiety symptoms.   NEURO: As Above.   Vital Signs:  BP 110/60 mmHg  Pulse 66  Ht 5\' 1"  (1.549 m)  Wt 89 lb (40.37 kg)  BMI 16.83 kg/m2  SpO2 99% Pain Scale: 0 on a scale of 0-10   General Medical Exam:   General:  Thin-appearing, comfortable.   Eyes/ENT: see cranial nerve examination.   Neck: No masses appreciated.  Full range of motion without tenderness.  Mild cervical increased muscle tension. Respiratory:  Clear to auscultation, good air entry bilaterally.   Cardiac:  Regular rate and rhythm, no murmur.   Skin:  No rashes or lesions.  Neurological Exam: MENTAL STATUS including orientation to time, place, person, recent and remote memory, attention span and concentration, language, and fund of knowledge is normal.  Speech is not dysarthric.  CRANIAL NERVES: II:  No visual field defects.  Unremarkable fundi.   III-IV-VI: Pupils equal round and reactive to light.  Normal conjugate, extra-ocular eye movements in all directions of gaze.  No nystagmus.  No ptosis.   V:  Normal facial sensation.    VII:  Normal facial symmetry and movements.  No pathologic facial reflexes.  VIII:  Normal hearing and vestibular function.     IX-X:  Normal palatal movement.   XI:  Normal shoulder shrug and head rotation.   XII:  Normal tongue strength and range of motion, no deviation or fasciculation.  MOTOR:  No atrophy, fasciculations or abnormal movements.  No pronator drift.  Tone is normal.    Right Upper Extremity:    Left Upper Extremity:    Deltoid  5/5   Deltoid  5/5   Biceps  5/5   Biceps  5/5   Triceps  5/5   Triceps  5/5   Wrist extensors  5/5   Wrist extensors  5/5   Wrist flexors  5/5   Wrist flexors  5/5   Finger extensors  5/5   Finger extensors  5/5   Finger flexors  5/5   Finger flexors  5/5   Dorsal interossei  5/5   Dorsal interossei  5/5   Abductor pollicis  5/5   Abductor pollicis  5/5   Tone (Ashworth  scale)  0  Tone (Ashworth scale)  0   Right Lower Extremity:    Left Lower Extremity:    Hip flexors  5/5   Hip flexors  5/5   Hip extensors  5/5   Hip extensors  5/5   Knee flexors  5/5   Knee flexors  5/5   Knee extensors  5/5   Knee extensors  5/5   Dorsiflexors  5/5   Dorsiflexors  5/5   Plantarflexors  5/5   Plantarflexors  5/5   Toe extensors  5/5   Toe extensors  5/5   Toe flexors  5/5   Toe flexors  5/5   Tone (Ashworth scale)  0  Tone (Ashworth scale)  0   MSRs:  Right                                                                 Left brachioradialis 2+  brachioradialis 2+  biceps 2+  biceps 2+  triceps 2+  triceps 2+  patellar 2+  patellar 2+  ankle jerk 2+  ankle jerk 2+  Hoffman no  Hoffman no  plantar response down  plantar response down   SENSORY:  Normal and symmetric perception of light touch, pinprick, vibration, and proprioception.  Romberg's sign is mildly present.   COORDINATION/GAIT: Normal finger-to- nose-finger and heel-to-shin.  Intact rapid alternating movements bilaterally.  Able to rise from a chair without using arms.  Gait narrow based and stable. Tandem and stressed gait intact.    IMPRESSION: 1.  Cervicogenic headaches, episodic.  With her syncopal spells  and new onset headaches in patient with history of cancer, I do want to check MRI brain and MRA head and neck to look for worrisome intracranial pathology such as tumor or aneurysm, however overall suspicion is low.  Imaging from 2012 indicated right vestibular nerve enhancement and this has not been checked again which may also contribute to dizziness, however her exam does not shows signs of nystagmus.  2.  Malnutrition and reduced energy.  She is vegetarian and at risk to develop nutrient deficiencies, so will check vitamin B12, vitamin D, and TSH for completeness.  3.  History of oral squamous cell carcinoma (2012)  PLAN/RECOMMENDATIONS:  1.  Check TSH, vitamin B12, vitamin D 2.  MRI brain wwo contrast, MRA head and neck 3.  Episodic headaches respond well to ibuprofen, if taken early enough.  I have asked her to take iburpofen 200mg  at headache onset and to avoid waiting until pain is severe enough.  She may also try magnesium oxide 200-400mg  as needed. 4.  Neck PT declined.  Consider home PT going forward and/or low dose muscle relaxant  Return to clinic in 3 months.   The duration of this appointment visit was 60 minutes of face-to-face time with the patient.  Greater than 50% of this time was spent in counseling, explanation of diagnosis, planning of further management, and coordination of care.   Thank you for allowing me to participate in patient's care.  If I can answer any additional questions, I would be pleased to do so.    Sincerely,    Donika K. Posey Pronto, DO

## 2015-05-09 NOTE — Patient Instructions (Addendum)
1.  MRI brain and MRA head and neck. We have scheduled you at [facility] for your MRI on 05/26/2015 at 3:00 pm. Please arrive 15 minutes prior and [place]. If you need to reschedule for any reason please call [phone]. 2.  Check blood work. Your provider has requested that you have labwork completed today. Please go to [facility] ([address]) on the second floor of this building before leaving the office today. You do not need to check in. If you are not called within 15 minutes please check with the front desk.  3.  Start taking ibuprofen 200mg  at headache onset.  You may also try taking magnesium oxide 200-400mg  during headaches to see if this helps. 4.  If there is worsening pain, please call my office to set up home physical therapy  Return to clinic 3 months

## 2015-05-13 NOTE — Progress Notes (Signed)
Note routed

## 2015-05-16 DIAGNOSIS — H52223 Regular astigmatism, bilateral: Secondary | ICD-10-CM | POA: Diagnosis not present

## 2015-05-16 DIAGNOSIS — H18413 Arcus senilis, bilateral: Secondary | ICD-10-CM | POA: Diagnosis not present

## 2015-05-16 DIAGNOSIS — Z961 Presence of intraocular lens: Secondary | ICD-10-CM | POA: Diagnosis not present

## 2015-05-16 DIAGNOSIS — H5203 Hypermetropia, bilateral: Secondary | ICD-10-CM | POA: Diagnosis not present

## 2015-05-26 ENCOUNTER — Other Ambulatory Visit (HOSPITAL_COMMUNITY): Payer: Commercial Managed Care - HMO

## 2015-05-26 ENCOUNTER — Ambulatory Visit (HOSPITAL_COMMUNITY): Admission: RE | Admit: 2015-05-26 | Payer: Commercial Managed Care - HMO | Source: Ambulatory Visit

## 2015-05-30 ENCOUNTER — Ambulatory Visit (HOSPITAL_COMMUNITY)
Admission: RE | Admit: 2015-05-30 | Discharge: 2015-05-30 | Disposition: A | Discharge status: Home / Self Care | Payer: Commercial Managed Care - HMO | Source: Ambulatory Visit | Attending: Neurology | Admitting: Neurology

## 2015-05-30 DIAGNOSIS — R51 Headache: Secondary | ICD-10-CM | POA: Insufficient documentation

## 2015-05-30 DIAGNOSIS — E46 Unspecified protein-calorie malnutrition: Secondary | ICD-10-CM

## 2015-05-30 DIAGNOSIS — C801 Malignant (primary) neoplasm, unspecified: Secondary | ICD-10-CM

## 2015-05-30 DIAGNOSIS — H933X1 Disorders of right acoustic nerve: Secondary | ICD-10-CM | POA: Insufficient documentation

## 2015-05-30 DIAGNOSIS — R519 Headache, unspecified: Secondary | ICD-10-CM

## 2015-05-30 DIAGNOSIS — G4486 Cervicogenic headache: Secondary | ICD-10-CM

## 2015-05-30 LAB — CREATININE, SERUM
Creatinine, Ser: 0.69 mg/dL (ref 0.44–1.00)
GFR calc Af Amer: >60 mL/min (ref >60)
GFR calc non Af Amer: >60 mL/min (ref >60)

## 2015-05-30 MED ORDER — GADOBENATE DIMEGLUMINE 529 MG/ML IV SOLN
10.0000 mL | Freq: Once | INTRAVENOUS | Status: AC | PRN
  Administered 2015-05-30: 9 mL via INTRAVENOUS

## 2015-06-06 ENCOUNTER — Ambulatory Visit (HOSPITAL_COMMUNITY): Admission: RE | Admit: 2015-06-06 | Payer: Commercial Managed Care - HMO | Source: Ambulatory Visit

## 2015-06-12 ENCOUNTER — Ambulatory Visit (HOSPITAL_COMMUNITY): Payer: Commercial Managed Care - HMO

## 2015-07-28 DIAGNOSIS — C069 Malignant neoplasm of mouth, unspecified: Secondary | ICD-10-CM | POA: Diagnosis not present

## 2015-07-28 DIAGNOSIS — Z85818 Personal history of malignant neoplasm of other sites of lip, oral cavity, and pharynx: Secondary | ICD-10-CM | POA: Diagnosis not present

## 2015-07-28 DIAGNOSIS — Z9889 Other specified postprocedural states: Secondary | ICD-10-CM | POA: Diagnosis not present

## 2015-07-28 DIAGNOSIS — Z08 Encounter for follow-up examination after completed treatment for malignant neoplasm: Secondary | ICD-10-CM | POA: Diagnosis not present

## 2015-10-20 DIAGNOSIS — M542 Cervicalgia: Secondary | ICD-10-CM | POA: Diagnosis not present

## 2015-10-20 DIAGNOSIS — C069 Malignant neoplasm of mouth, unspecified: Secondary | ICD-10-CM | POA: Diagnosis not present

## 2015-10-20 DIAGNOSIS — C060 Malignant neoplasm of cheek mucosa: Secondary | ICD-10-CM | POA: Diagnosis not present

## 2015-11-11 DIAGNOSIS — I10 Essential (primary) hypertension: Secondary | ICD-10-CM | POA: Diagnosis not present

## 2015-11-11 DIAGNOSIS — E440 Moderate protein-calorie malnutrition: Secondary | ICD-10-CM | POA: Diagnosis not present

## 2015-11-11 DIAGNOSIS — E1122 Type 2 diabetes mellitus with diabetic chronic kidney disease: Secondary | ICD-10-CM | POA: Diagnosis not present

## 2015-11-11 DIAGNOSIS — Z86008 Personal history of in-situ neoplasm of other site: Secondary | ICD-10-CM | POA: Diagnosis not present

## 2015-11-11 DIAGNOSIS — E785 Hyperlipidemia, unspecified: Secondary | ICD-10-CM | POA: Diagnosis not present

## 2015-11-11 DIAGNOSIS — N182 Chronic kidney disease, stage 2 (mild): Secondary | ICD-10-CM | POA: Diagnosis not present

## 2016-04-19 DIAGNOSIS — Z85819 Personal history of malignant neoplasm of unspecified site of lip, oral cavity, and pharynx: Secondary | ICD-10-CM | POA: Diagnosis not present

## 2016-04-19 DIAGNOSIS — C069 Malignant neoplasm of mouth, unspecified: Secondary | ICD-10-CM | POA: Diagnosis not present

## 2016-04-19 DIAGNOSIS — Z08 Encounter for follow-up examination after completed treatment for malignant neoplasm: Secondary | ICD-10-CM | POA: Diagnosis not present

## 2016-10-12 DIAGNOSIS — Z681 Body mass index (BMI) 19 or less, adult: Secondary | ICD-10-CM | POA: Diagnosis not present

## 2016-10-12 DIAGNOSIS — E119 Type 2 diabetes mellitus without complications: Secondary | ICD-10-CM | POA: Diagnosis not present

## 2016-10-12 DIAGNOSIS — E440 Moderate protein-calorie malnutrition: Secondary | ICD-10-CM | POA: Diagnosis not present

## 2016-10-12 DIAGNOSIS — E785 Hyperlipidemia, unspecified: Secondary | ICD-10-CM | POA: Diagnosis not present

## 2016-10-12 DIAGNOSIS — R946 Abnormal results of thyroid function studies: Secondary | ICD-10-CM | POA: Diagnosis not present

## 2016-10-12 DIAGNOSIS — Z0000 Encounter for general adult medical examination without abnormal findings: Secondary | ICD-10-CM | POA: Diagnosis not present

## 2016-10-12 DIAGNOSIS — I10 Essential (primary) hypertension: Secondary | ICD-10-CM | POA: Diagnosis not present

## 2016-10-12 DIAGNOSIS — F321 Major depressive disorder, single episode, moderate: Secondary | ICD-10-CM | POA: Diagnosis not present

## 2016-10-12 DIAGNOSIS — Z86008 Personal history of in-situ neoplasm of other site: Secondary | ICD-10-CM | POA: Diagnosis not present

## 2016-10-22 DIAGNOSIS — Z923 Personal history of irradiation: Secondary | ICD-10-CM | POA: Diagnosis not present

## 2016-10-22 DIAGNOSIS — Z85818 Personal history of malignant neoplasm of other sites of lip, oral cavity, and pharynx: Secondary | ICD-10-CM | POA: Diagnosis not present

## 2016-10-22 DIAGNOSIS — Z048 Encounter for examination and observation for other specified reasons: Secondary | ICD-10-CM | POA: Diagnosis not present

## 2016-10-22 DIAGNOSIS — Z9889 Other specified postprocedural states: Secondary | ICD-10-CM | POA: Diagnosis not present

## 2016-10-22 DIAGNOSIS — C069 Malignant neoplasm of mouth, unspecified: Secondary | ICD-10-CM | POA: Diagnosis not present

## 2016-11-16 DIAGNOSIS — E440 Moderate protein-calorie malnutrition: Secondary | ICD-10-CM | POA: Diagnosis not present

## 2016-11-16 DIAGNOSIS — Z7984 Long term (current) use of oral hypoglycemic drugs: Secondary | ICD-10-CM | POA: Diagnosis not present

## 2016-11-16 DIAGNOSIS — F321 Major depressive disorder, single episode, moderate: Secondary | ICD-10-CM | POA: Diagnosis not present

## 2016-11-16 DIAGNOSIS — R946 Abnormal results of thyroid function studies: Secondary | ICD-10-CM | POA: Diagnosis not present

## 2016-11-16 DIAGNOSIS — E1165 Type 2 diabetes mellitus with hyperglycemia: Secondary | ICD-10-CM | POA: Diagnosis not present

## 2016-11-29 DIAGNOSIS — H9391 Unspecified disorder of right ear: Secondary | ICD-10-CM | POA: Diagnosis not present

## 2016-11-29 DIAGNOSIS — C069 Malignant neoplasm of mouth, unspecified: Secondary | ICD-10-CM | POA: Diagnosis not present

## 2016-12-01 DIAGNOSIS — Z780 Asymptomatic menopausal state: Secondary | ICD-10-CM | POA: Diagnosis not present

## 2016-12-06 DIAGNOSIS — B029 Zoster without complications: Secondary | ICD-10-CM | POA: Diagnosis not present

## 2016-12-06 DIAGNOSIS — D333 Benign neoplasm of cranial nerves: Secondary | ICD-10-CM | POA: Diagnosis not present

## 2016-12-06 DIAGNOSIS — F321 Major depressive disorder, single episode, moderate: Secondary | ICD-10-CM | POA: Diagnosis not present

## 2017-01-03 DIAGNOSIS — E119 Type 2 diabetes mellitus without complications: Secondary | ICD-10-CM | POA: Diagnosis not present

## 2017-01-03 DIAGNOSIS — Z7984 Long term (current) use of oral hypoglycemic drugs: Secondary | ICD-10-CM | POA: Diagnosis not present

## 2017-01-03 DIAGNOSIS — L918 Other hypertrophic disorders of the skin: Secondary | ICD-10-CM | POA: Diagnosis not present

## 2017-01-11 DIAGNOSIS — H903 Sensorineural hearing loss, bilateral: Secondary | ICD-10-CM | POA: Diagnosis not present

## 2017-01-11 DIAGNOSIS — D333 Benign neoplasm of cranial nerves: Secondary | ICD-10-CM | POA: Diagnosis not present

## 2017-01-28 DIAGNOSIS — R946 Abnormal results of thyroid function studies: Secondary | ICD-10-CM | POA: Diagnosis not present

## 2017-04-18 DIAGNOSIS — Z7984 Long term (current) use of oral hypoglycemic drugs: Secondary | ICD-10-CM | POA: Diagnosis not present

## 2017-04-18 DIAGNOSIS — Z23 Encounter for immunization: Secondary | ICD-10-CM | POA: Diagnosis not present

## 2017-04-18 DIAGNOSIS — E039 Hypothyroidism, unspecified: Secondary | ICD-10-CM | POA: Diagnosis not present

## 2017-04-18 DIAGNOSIS — F321 Major depressive disorder, single episode, moderate: Secondary | ICD-10-CM | POA: Diagnosis not present

## 2017-04-18 DIAGNOSIS — E119 Type 2 diabetes mellitus without complications: Secondary | ICD-10-CM | POA: Diagnosis not present

## 2017-04-18 DIAGNOSIS — I10 Essential (primary) hypertension: Secondary | ICD-10-CM | POA: Diagnosis not present

## 2017-04-18 DIAGNOSIS — E440 Moderate protein-calorie malnutrition: Secondary | ICD-10-CM | POA: Diagnosis not present

## 2017-04-18 DIAGNOSIS — Z681 Body mass index (BMI) 19 or less, adult: Secondary | ICD-10-CM | POA: Diagnosis not present

## 2017-04-18 DIAGNOSIS — E785 Hyperlipidemia, unspecified: Secondary | ICD-10-CM | POA: Diagnosis not present

## 2017-07-04 DIAGNOSIS — C069 Malignant neoplasm of mouth, unspecified: Secondary | ICD-10-CM | POA: Diagnosis not present

## 2017-07-04 DIAGNOSIS — R1312 Dysphagia, oropharyngeal phase: Secondary | ICD-10-CM | POA: Diagnosis not present

## 2017-07-07 ENCOUNTER — Other Ambulatory Visit (INDEPENDENT_AMBULATORY_CARE_PROVIDER_SITE_OTHER): Payer: Self-pay | Admitting: Otolaryngology

## 2017-07-07 DIAGNOSIS — D333 Benign neoplasm of cranial nerves: Secondary | ICD-10-CM

## 2017-07-08 DIAGNOSIS — R946 Abnormal results of thyroid function studies: Secondary | ICD-10-CM | POA: Diagnosis not present

## 2017-07-10 ENCOUNTER — Ambulatory Visit
Admission: RE | Admit: 2017-07-10 | Discharge: 2017-07-10 | Disposition: A | Discharge status: Home / Self Care | Payer: Commercial Managed Care - HMO | Source: Ambulatory Visit | Attending: Otolaryngology | Admitting: Otolaryngology

## 2017-07-10 DIAGNOSIS — D333 Benign neoplasm of cranial nerves: Secondary | ICD-10-CM

## 2017-07-10 MED ORDER — GADOBENATE DIMEGLUMINE 529 MG/ML IV SOLN
8.0000 mL | Freq: Once | INTRAVENOUS | Status: AC | PRN
  Administered 2017-07-10: 8 mL via INTRAVENOUS

## 2017-07-12 DIAGNOSIS — H903 Sensorineural hearing loss, bilateral: Secondary | ICD-10-CM | POA: Diagnosis not present

## 2017-07-12 DIAGNOSIS — D333 Benign neoplasm of cranial nerves: Secondary | ICD-10-CM | POA: Diagnosis not present

## 2017-07-29 DIAGNOSIS — L509 Urticaria, unspecified: Secondary | ICD-10-CM | POA: Diagnosis not present

## 2017-09-16 DIAGNOSIS — L280 Lichen simplex chronicus: Secondary | ICD-10-CM | POA: Diagnosis not present

## 2017-09-16 DIAGNOSIS — L219 Seborrheic dermatitis, unspecified: Secondary | ICD-10-CM | POA: Diagnosis not present

## 2017-09-16 DIAGNOSIS — M25519 Pain in unspecified shoulder: Secondary | ICD-10-CM | POA: Diagnosis not present

## 2017-10-14 DIAGNOSIS — Z08 Encounter for follow-up examination after completed treatment for malignant neoplasm: Secondary | ICD-10-CM | POA: Diagnosis not present

## 2017-10-14 DIAGNOSIS — L409 Psoriasis, unspecified: Secondary | ICD-10-CM | POA: Diagnosis not present

## 2017-10-14 DIAGNOSIS — C069 Malignant neoplasm of mouth, unspecified: Secondary | ICD-10-CM | POA: Diagnosis not present

## 2017-10-17 DIAGNOSIS — R21 Rash and other nonspecific skin eruption: Secondary | ICD-10-CM | POA: Diagnosis not present

## 2017-10-17 DIAGNOSIS — F321 Major depressive disorder, single episode, moderate: Secondary | ICD-10-CM | POA: Diagnosis not present

## 2017-10-17 DIAGNOSIS — Z23 Encounter for immunization: Secondary | ICD-10-CM | POA: Diagnosis not present

## 2017-10-17 DIAGNOSIS — E039 Hypothyroidism, unspecified: Secondary | ICD-10-CM | POA: Diagnosis not present

## 2017-10-17 DIAGNOSIS — E1169 Type 2 diabetes mellitus with other specified complication: Secondary | ICD-10-CM | POA: Diagnosis not present

## 2017-10-17 DIAGNOSIS — Z0000 Encounter for general adult medical examination without abnormal findings: Secondary | ICD-10-CM | POA: Diagnosis not present

## 2017-10-17 DIAGNOSIS — I10 Essential (primary) hypertension: Secondary | ICD-10-CM | POA: Diagnosis not present

## 2017-10-17 DIAGNOSIS — R413 Other amnesia: Secondary | ICD-10-CM | POA: Diagnosis not present

## 2017-10-17 DIAGNOSIS — E785 Hyperlipidemia, unspecified: Secondary | ICD-10-CM | POA: Diagnosis not present

## 2017-11-09 DIAGNOSIS — L218 Other seborrheic dermatitis: Secondary | ICD-10-CM | POA: Diagnosis not present

## 2017-11-27 DIAGNOSIS — M792 Neuralgia and neuritis, unspecified: Secondary | ICD-10-CM | POA: Diagnosis not present

## 2017-11-27 DIAGNOSIS — R202 Paresthesia of skin: Secondary | ICD-10-CM | POA: Diagnosis not present

## 2017-11-30 DIAGNOSIS — E1169 Type 2 diabetes mellitus with other specified complication: Secondary | ICD-10-CM | POA: Diagnosis not present

## 2017-11-30 DIAGNOSIS — R413 Other amnesia: Secondary | ICD-10-CM | POA: Diagnosis not present

## 2017-11-30 DIAGNOSIS — M79604 Pain in right leg: Secondary | ICD-10-CM | POA: Diagnosis not present

## 2017-11-30 DIAGNOSIS — R202 Paresthesia of skin: Secondary | ICD-10-CM | POA: Diagnosis not present

## 2017-11-30 DIAGNOSIS — Z7984 Long term (current) use of oral hypoglycemic drugs: Secondary | ICD-10-CM | POA: Diagnosis not present

## 2017-12-03 DIAGNOSIS — R21 Rash and other nonspecific skin eruption: Secondary | ICD-10-CM | POA: Diagnosis not present

## 2018-03-17 ENCOUNTER — Ambulatory Visit
Admission: RE | Admit: 2018-03-17 | Discharge: 2018-03-17 | Disposition: A | Discharge status: Home / Self Care | Payer: Medicare HMO | Source: Ambulatory Visit | Attending: Family Medicine | Admitting: Family Medicine

## 2018-03-17 ENCOUNTER — Other Ambulatory Visit: Payer: Self-pay | Admitting: Family Medicine

## 2018-03-17 DIAGNOSIS — G8929 Other chronic pain: Secondary | ICD-10-CM | POA: Diagnosis not present

## 2018-03-17 DIAGNOSIS — M5489 Other dorsalgia: Secondary | ICD-10-CM

## 2018-03-17 DIAGNOSIS — M5441 Lumbago with sciatica, right side: Secondary | ICD-10-CM | POA: Diagnosis not present

## 2018-03-17 DIAGNOSIS — M47816 Spondylosis without myelopathy or radiculopathy, lumbar region: Secondary | ICD-10-CM | POA: Diagnosis not present

## 2018-03-17 DIAGNOSIS — I10 Essential (primary) hypertension: Secondary | ICD-10-CM | POA: Diagnosis not present

## 2018-03-17 DIAGNOSIS — Z23 Encounter for immunization: Secondary | ICD-10-CM | POA: Diagnosis not present

## 2018-03-21 ENCOUNTER — Other Ambulatory Visit: Payer: Self-pay | Admitting: Family Medicine

## 2018-03-21 DIAGNOSIS — M5441 Lumbago with sciatica, right side: Secondary | ICD-10-CM

## 2018-03-24 ENCOUNTER — Ambulatory Visit
Admission: RE | Admit: 2018-03-24 | Discharge: 2018-03-24 | Disposition: A | Discharge status: Home / Self Care | Payer: Medicare HMO | Source: Ambulatory Visit | Attending: Family Medicine | Admitting: Family Medicine

## 2018-03-24 DIAGNOSIS — M48061 Spinal stenosis, lumbar region without neurogenic claudication: Secondary | ICD-10-CM | POA: Diagnosis not present

## 2018-03-24 DIAGNOSIS — M5116 Intervertebral disc disorders with radiculopathy, lumbar region: Secondary | ICD-10-CM | POA: Diagnosis not present

## 2018-03-24 DIAGNOSIS — L219 Seborrheic dermatitis, unspecified: Secondary | ICD-10-CM | POA: Diagnosis not present

## 2018-03-24 DIAGNOSIS — M4726 Other spondylosis with radiculopathy, lumbar region: Secondary | ICD-10-CM | POA: Diagnosis not present

## 2018-03-24 DIAGNOSIS — M5117 Intervertebral disc disorders with radiculopathy, lumbosacral region: Secondary | ICD-10-CM | POA: Diagnosis not present

## 2018-03-24 DIAGNOSIS — M4807 Spinal stenosis, lumbosacral region: Secondary | ICD-10-CM | POA: Diagnosis not present

## 2018-03-24 DIAGNOSIS — M5441 Lumbago with sciatica, right side: Secondary | ICD-10-CM

## 2018-03-24 MED ORDER — GADOBENATE DIMEGLUMINE 529 MG/ML IV SOLN
8.0000 mL | Freq: Once | INTRAVENOUS | Status: AC | PRN
  Administered 2018-03-24: 8 mL via INTRAVENOUS

## 2018-04-04 DIAGNOSIS — M5416 Radiculopathy, lumbar region: Secondary | ICD-10-CM | POA: Diagnosis not present

## 2018-04-05 ENCOUNTER — Encounter (HOSPITAL_COMMUNITY): Payer: Self-pay

## 2018-04-05 ENCOUNTER — Other Ambulatory Visit: Payer: Self-pay | Admitting: Neurological Surgery

## 2018-04-05 NOTE — Pre-Procedure Instructions (Signed)
Hannah [name]  04/05/2018      [facility] ([facility] [place] [address] [name] ([place]) [place] [name] [address] Phone: [phone] Fax: [phone]    Your procedure is scheduled on Wednesday November 27th.  Report to [facility] Admitting at 1100 A.M.  Call this number if you have problems the morning of surgery:  [phone]   Remember:  Do not eat or drink after midnight.    Take these medicines the morning of surgery with A SIP OF WATER   amLODipine (NORVASC)  7 days prior to surgery STOP taking any Aspirin(unless otherwise instructed by your surgeon), Aleve, Naproxen, Ibuprofen, Motrin, Advil, Goody's, BC's, all herbal medications, fish oil, and all vitamins     Do not wear jewelry, make-up or nail polish.  Do not wear lotions, powders, or perfumes, or deodorant.  Do not shave 48 hours prior to surgery.  Men may shave face and neck.  Do not bring valuables to the hospital.  [facility] is not responsible for any belongings or valuables.  Contacts, dentures or bridgework may not be worn into surgery.  Leave your suitcase in the car.  After surgery it may be brought to your room.  For patients admitted to the hospital, discharge time will be determined by your treatment team.  Patients discharged the day of surgery will not be allowed to drive home.    [place]- Preparing For Surgery  Before surgery, you can play an important role. Because skin is not sterile, your skin needs to be as free of germs as possible. You can reduce the number of germs on your skin by washing with CHG (chlorahexidine gluconate) Soap before surgery.  CHG is an antiseptic cleaner which kills germs and bonds with the skin to continue killing germs even after washing.    Oral Hygiene is also important to reduce your risk of infection.  Remember - BRUSH YOUR TEETH THE MORNING OF SURGERY WITH YOUR REGULAR TOOTHPASTE  Please do not use if you  have an allergy to CHG or antibacterial soaps. If your skin becomes reddened/irritated stop using the CHG.  Do not shave (including legs and underarms) for at least 48 hours prior to first CHG shower. It is OK to shave your face.  Please follow these instructions carefully.   1. Shower the NIGHT BEFORE SURGERY and the MORNING OF SURGERY with CHG.   2. If you chose to wash your hair, wash your hair first as usual with your normal shampoo.  3. After you shampoo, rinse your hair and body thoroughly to remove the shampoo.  4. Use CHG as you would any other liquid soap. You can apply CHG directly to the skin and wash gently with a scrungie or a clean washcloth.   5. Apply the CHG Soap to your body ONLY FROM THE NECK DOWN.  Do not use on open wounds or open sores. Avoid contact with your eyes, ears, mouth and genitals (private parts). Wash Face and genitals (private parts)  with your normal soap.  6. Wash thoroughly, paying special attention to the area where your surgery will be performed.  7. Thoroughly rinse your body with warm water from the neck down.  8. DO NOT shower/wash with your normal soap after using and rinsing off the CHG Soap.  9. Pat yourself dry with a CLEAN TOWEL.  10. Wear CLEAN PAJAMAS to bed the night before surgery, wear comfortable clothes the  morning of surgery  11. Place CLEAN SHEETS on your bed the night of your first shower and DO NOT SLEEP WITH PETS.    Day of Surgery:  Do not apply any deodorants/lotions.  Please wear clean clothes to the hospital/surgery center.   Remember to brush your teeth WITH YOUR REGULAR TOOTHPASTE.    Please read over the following fact sheets that you were given.

## 2018-04-06 ENCOUNTER — Other Ambulatory Visit: Payer: Self-pay

## 2018-04-06 ENCOUNTER — Encounter (HOSPITAL_COMMUNITY): Payer: Self-pay

## 2018-04-06 ENCOUNTER — Encounter (HOSPITAL_COMMUNITY)
Admission: RE | Admit: 2018-04-06 | Discharge: 2018-04-06 | Disposition: A | Discharge status: Home / Self Care | Payer: Medicare HMO | Source: Ambulatory Visit | Attending: Neurological Surgery | Admitting: Neurological Surgery

## 2018-04-06 ENCOUNTER — Ambulatory Visit (HOSPITAL_COMMUNITY)
Admission: RE | Admit: 2018-04-06 | Discharge: 2018-04-06 | Disposition: A | Discharge status: Home / Self Care | Payer: Medicare HMO | Source: Ambulatory Visit | Attending: Neurological Surgery | Admitting: Neurological Surgery

## 2018-04-06 DIAGNOSIS — M48061 Spinal stenosis, lumbar region without neurogenic claudication: Secondary | ICD-10-CM | POA: Insufficient documentation

## 2018-04-06 DIAGNOSIS — Z7982 Long term (current) use of aspirin: Secondary | ICD-10-CM | POA: Insufficient documentation

## 2018-04-06 DIAGNOSIS — Z79899 Other long term (current) drug therapy: Secondary | ICD-10-CM | POA: Diagnosis not present

## 2018-04-06 DIAGNOSIS — Z01818 Encounter for other preprocedural examination: Secondary | ICD-10-CM | POA: Insufficient documentation

## 2018-04-06 DIAGNOSIS — Z01811 Encounter for preprocedural respiratory examination: Secondary | ICD-10-CM | POA: Diagnosis not present

## 2018-04-06 HISTORY — DX: Depression, unspecified: F32.A

## 2018-04-06 HISTORY — DX: Hypothyroidism, unspecified: E03.9

## 2018-04-06 HISTORY — DX: Major depressive disorder, single episode, unspecified: F32.9

## 2018-04-06 HISTORY — DX: Respiratory tuberculosis unspecified: A15.9

## 2018-04-06 LAB — CBC WITH DIFFERENTIAL/PLATELET
ABS IMMATURE GRANULOCYTES: 0.02 10*3/uL (ref 0.00–0.07)
BASOS PCT: 1 %
Basophils Absolute: 0.1 10*3/uL (ref 0.0–0.1)
Eosinophils Absolute: 1 10*3/uL — ABNORMAL HIGH (ref 0.0–0.5)
Eosinophils Relative: 10 %
HCT: 38.6 % (ref 36.0–46.0)
Hemoglobin: 11.6 g/dL — ABNORMAL LOW (ref 12.0–15.0)
IMMATURE GRANULOCYTES: 0 %
LYMPHS PCT: 29 %
Lymphs Abs: 2.8 10*3/uL (ref 0.7–4.0)
MCH: 26.2 pg (ref 26.0–34.0)
MCHC: 30.1 g/dL (ref 30.0–36.0)
MCV: 87.1 fL (ref 80.0–100.0)
MONO ABS: 0.9 10*3/uL (ref 0.1–1.0)
MONOS PCT: 9 %
NEUTROS ABS: 5.2 10*3/uL (ref 1.7–7.7)
NEUTROS PCT: 51 %
PLATELETS: 257 10*3/uL (ref 150–400)
RBC: 4.43 MIL/uL (ref 3.87–5.11)
RDW: 13.6 % (ref 11.5–15.5)
WBC: 9.9 10*3/uL (ref 4.0–10.5)
nRBC: 0 % (ref 0.0–0.2)

## 2018-04-06 LAB — BASIC METABOLIC PANEL
ANION GAP: 8 (ref 5–15)
BUN: 13 mg/dL (ref 8–23)
CHLORIDE: 104 mmol/L (ref 98–111)
CO2: 26 mmol/L (ref 22–32)
Calcium: 9.4 mg/dL (ref 8.9–10.3)
Creatinine, Ser: 0.91 mg/dL (ref 0.44–1.00)
GFR calc Af Amer: >60 mL/min (ref >60)
GFR calc non Af Amer: 57 mL/min — ABNORMAL LOW (ref >60)
GLUCOSE: 170 mg/dL — AB (ref 70–99)
POTASSIUM: 4 mmol/L (ref 3.5–5.1)
Sodium: 138 mmol/L (ref 135–145)

## 2018-04-06 LAB — SURGICAL PCR SCREEN
MRSA, PCR: NEGATIVE
Staphylococcus aureus: POSITIVE — AB

## 2018-04-06 LAB — PROTIME-INR
INR: 1
Prothrombin Time: 13.1 seconds (ref 11.4–15.2)

## 2018-04-06 LAB — GLUCOSE, CAPILLARY: Glucose-Capillary: 163 mg/dL — ABNORMAL HIGH (ref 70–99)

## 2018-04-06 NOTE — Progress Notes (Signed)
Mupirocin Ointment Rx called into [facility] [address] for positive PCR of Staph. Left message for pt informing her of results and need to pick up the Rx.

## 2018-04-06 NOTE — Progress Notes (Signed)
Hannah [name]            04/05/2018                          [facility] ([facility] [place] [address] [place]) [place] [address] Phone: [phone] Fax: [phone]              Your procedure is scheduled on Wednesday November 27th.            Report to [facility] Admitting at 1100 A.M.            Call this number if you have problems the morning of surgery:            [phone]             Remember:            Do not eat or drink after midnight.                        Take these medicines the morning of surgery with A SIP OF WATER             amLODipine (NORVASC)  7 days prior to surgery STOP taking any Aspirin(unless otherwise instructed by your surgeon), Aleve, Naproxen, Ibuprofen, Motrin, Advil, Goody's, BC's, all herbal medications, fish oil, and all vitamins   WHAT DO I DO ABOUT MY DIABETES MEDICATION?   Marland Kitchen Do not take oral diabetes medicines (pills) the morning of surgery: sitaGLIPtin (JANUVIA).  . The day of surgery, do not take other diabetes injectables, including Byetta (exenatide), Bydureon (exenatide ER), Victoza (liraglutide), or Trulicity (dulaglutide).  . If your CBG is greater than 220 mg/dL, you may take  of your sliding scale (correction) dose of insulin.   How to Manage Your Diabetes Before and After Surgery  Why is it important to control my blood sugar before and after surgery? . Improving blood sugar levels before and after surgery helps healing and can limit problems. . A way of improving blood sugar control is eating a healthy diet by: o  Eating less sugar and carbohydrates o  Increasing activity/exercise o  Talking with your doctor about reaching your blood sugar goals . High blood sugars (greater than 180 mg/dL) can raise your risk of infections and slow your recovery, so you will need to focus on controlling your diabetes during the weeks before surgery. . Make  sure that the doctor who takes care of your diabetes knows about your planned surgery including the date and location.  How do I manage my blood sugar before surgery? . Check your blood sugar at least 4 times a day, starting 2 days before surgery, to make sure that the level is not too high or low. o Check your blood sugar the morning of your surgery when you wake up and every 2 hours until you get to the Short Stay unit. . If your blood sugar is less than 70 mg/dL, you will need to treat for low blood sugar: o Do not take insulin. o Treat a low blood sugar (less than 70 mg/dL) with  cup of clear juice (cranberry or apple), 4 glucose tablets, OR glucose gel. o Recheck blood sugar in 15 minutes after treatment (to make sure it is greater than 70 mg/dL). If your blood  sugar is not greater than 70 mg/dL on recheck, call [phone] for further instructions. . Report your blood sugar to the short stay nurse when you get to Short Stay.  . If you are admitted to the hospital after surgery: o Your blood sugar will be checked by the staff and you will probably be given insulin after surgery (instead of oral diabetes medicines) to make sure you have good blood sugar levels. o The goal for blood sugar control after surgery is 80-180 mg/dL.                         Do not wear jewelry, make-up or nail polish.            Do not wear lotions, powders, or perfumes, or deodorant.            Do not shave 48 hours prior to surgery.  Men may shave face and neck.            Do not bring valuables to the hospital.            [facility] is not responsible for any belongings or valuables.  Contacts, dentures or bridgework may not be worn into surgery.  Leave your suitcase in the car.  After surgery it may be brought to your room.  For patients admitted to the hospital, discharge time will be determined by your treatment team.  Patients discharged the day of surgery will not be allowed to drive home.     [place]- Preparing For Surgery  Before surgery, you can play an important role. Because skin is not sterile, your skin needs to be as free of germs as possible. You can reduce the number of germs on your skin by washing with CHG (chlorahexidine gluconate) Soap before surgery.  CHG is an antiseptic cleaner which kills germs and bonds with the skin to continue killing germs even after washing.    Oral Hygiene is also important to reduce your risk of infection.  Remember - BRUSH YOUR TEETH THE MORNING OF SURGERY WITH YOUR REGULAR TOOTHPASTE  Please do not use if you have an allergy to CHG or antibacterial soaps. If your skin becomes reddened/irritated stop using the CHG.  Do not shave (including legs and underarms) for at least 48 hours prior to first CHG shower. It is OK to shave your face.  Please follow these instructions carefully.                                                                                                                     1. Shower the NIGHT BEFORE SURGERY and the MORNING OF SURGERY with CHG.   2. If you chose to wash your hair, wash your hair first as usual with your normal shampoo.  3. After you shampoo, rinse your hair and body thoroughly to remove the shampoo.  4. Use CHG as you would any other liquid soap. You can apply CHG directly to the  skin and wash gently with a scrungie or a clean washcloth.   5. Apply the CHG Soap to your body ONLY FROM THE NECK DOWN.  Do not use on open wounds or open sores. Avoid contact with your eyes, ears, mouth and genitals (private parts). Wash Face and genitals (private parts)  with your normal soap.  6. Wash thoroughly, paying special attention to the area where your surgery will be performed.  7. Thoroughly rinse your body with warm water from the neck down.  8. DO NOT shower/wash with your normal soap after using and rinsing off the CHG Soap.  9. Pat yourself dry with a CLEAN TOWEL.  10. Wear CLEAN  PAJAMAS to bed the night before surgery, wear comfortable clothes the morning of surgery  11. Place CLEAN SHEETS on your bed the night of your first shower and DO NOT SLEEP WITH PETS.    Day of Surgery:  Do not apply any deodorants/lotions.  Please wear clean clothes to the hospital/surgery center.   Remember to brush your teeth WITH YOUR REGULAR TOOTHPASTE.    Please read over the following fact sheets that you were given.

## 2018-04-06 NOTE — Progress Notes (Signed)
PCP - Dr. Donald Prose MD  Chest x-ray - 04/06/18 EKG - 04/06/18  Fasting Blood Sugar -120-140  Checks Blood Sugar occasionally  Blood Thinner Instructions: N/A Aspirin Instructions: has already stopped.  Anesthesia review: none  Patient denies shortness of breath, fever, cough and chest pain at PAT appointment   Patient verbalized understanding of instructions that were given to them at the PAT appointment. Patient was also instructed that they will need to review over the PAT instructions again at home before surgery.

## 2018-04-12 ENCOUNTER — Ambulatory Visit (HOSPITAL_COMMUNITY)
Admission: RE | Admit: 2018-04-12 | Discharge: 2018-04-12 | Disposition: A | Discharge status: Home / Self Care | Payer: Medicare HMO | Source: Ambulatory Visit | Attending: Neurological Surgery | Admitting: Neurological Surgery

## 2018-04-12 ENCOUNTER — Encounter (HOSPITAL_COMMUNITY)
Admission: RE | Disposition: A | Discharge status: Home / Self Care | Payer: Self-pay | Source: Ambulatory Visit | Attending: Neurological Surgery

## 2018-04-12 ENCOUNTER — Inpatient Hospital Stay (HOSPITAL_COMMUNITY): Payer: Medicare HMO | Admitting: Certified Registered Nurse Anesthetist

## 2018-04-12 ENCOUNTER — Encounter (HOSPITAL_COMMUNITY): Payer: Self-pay

## 2018-04-12 ENCOUNTER — Inpatient Hospital Stay (HOSPITAL_COMMUNITY): Payer: Medicare HMO

## 2018-04-12 DIAGNOSIS — E119 Type 2 diabetes mellitus without complications: Secondary | ICD-10-CM | POA: Diagnosis not present

## 2018-04-12 DIAGNOSIS — E7800 Pure hypercholesterolemia, unspecified: Secondary | ICD-10-CM | POA: Insufficient documentation

## 2018-04-12 DIAGNOSIS — Z79899 Other long term (current) drug therapy: Secondary | ICD-10-CM | POA: Diagnosis not present

## 2018-04-12 DIAGNOSIS — Z7982 Long term (current) use of aspirin: Secondary | ICD-10-CM | POA: Diagnosis not present

## 2018-04-12 DIAGNOSIS — Z79890 Hormone replacement therapy (postmenopausal): Secondary | ICD-10-CM | POA: Diagnosis not present

## 2018-04-12 DIAGNOSIS — I10 Essential (primary) hypertension: Secondary | ICD-10-CM | POA: Insufficient documentation

## 2018-04-12 DIAGNOSIS — Z9889 Other specified postprocedural states: Secondary | ICD-10-CM

## 2018-04-12 DIAGNOSIS — Z419 Encounter for procedure for purposes other than remedying health state, unspecified: Secondary | ICD-10-CM

## 2018-04-12 DIAGNOSIS — E039 Hypothyroidism, unspecified: Secondary | ICD-10-CM | POA: Diagnosis not present

## 2018-04-12 DIAGNOSIS — Z791 Long term (current) use of non-steroidal anti-inflammatories (NSAID): Secondary | ICD-10-CM | POA: Diagnosis not present

## 2018-04-12 DIAGNOSIS — Z7984 Long term (current) use of oral hypoglycemic drugs: Secondary | ICD-10-CM | POA: Insufficient documentation

## 2018-04-12 DIAGNOSIS — Z85819 Personal history of malignant neoplasm of unspecified site of lip, oral cavity, and pharynx: Secondary | ICD-10-CM | POA: Insufficient documentation

## 2018-04-12 DIAGNOSIS — M4807 Spinal stenosis, lumbosacral region: Secondary | ICD-10-CM | POA: Diagnosis not present

## 2018-04-12 DIAGNOSIS — M48061 Spinal stenosis, lumbar region without neurogenic claudication: Secondary | ICD-10-CM | POA: Diagnosis not present

## 2018-04-12 HISTORY — PX: LUMBAR LAMINECTOMY/DECOMPRESSION MICRODISCECTOMY: SHX5026

## 2018-04-12 LAB — GLUCOSE, CAPILLARY
Glucose-Capillary: 109 mg/dL — ABNORMAL HIGH (ref 70–99)
Glucose-Capillary: 127 mg/dL — ABNORMAL HIGH (ref 70–99)

## 2018-04-12 SURGERY — LUMBAR LAMINECTOMY/DECOMPRESSION MICRODISCECTOMY 2 LEVELS
Anesthesia: General | Site: Back | Laterality: Right

## 2018-04-12 MED ORDER — FENTANYL CITRATE (PF) 100 MCG/2ML IJ SOLN
INTRAMUSCULAR | Status: DC | PRN
  Administered 2018-04-12: 50 ug via INTRAVENOUS

## 2018-04-12 MED ORDER — SODIUM CHLORIDE 0.9% FLUSH: 3.0000 mL | INTRAVENOUS | Status: DC | PRN

## 2018-04-12 MED ORDER — ACETAMINOPHEN 325 MG PO TABS: 650.0000 mg | ORAL_TABLET | ORAL | Status: DC | PRN

## 2018-04-12 MED ORDER — HYDROCODONE-ACETAMINOPHEN 5-325 MG PO TABS: 1.0000 | ORAL_TABLET | ORAL | Status: DC | PRN

## 2018-04-12 MED ORDER — EZETIMIBE-SIMVASTATIN 10-20 MG PO TABS
1.0000 | ORAL_TABLET | Freq: Every day | ORAL | Status: DC
  Filled 2018-04-12: qty 1

## 2018-04-12 MED ORDER — SERTRALINE HCL 50 MG PO TABS: 50.0000 mg | ORAL_TABLET | Freq: Every day | ORAL | Status: DC

## 2018-04-12 MED ORDER — FENTANYL CITRATE (PF) 100 MCG/2ML IJ SOLN: 25.0000 ug | INTRAMUSCULAR | Status: DC | PRN

## 2018-04-12 MED ORDER — LEVOTHYROXINE SODIUM 25 MCG PO TABS: 25.0000 ug | ORAL_TABLET | ORAL | Status: DC

## 2018-04-12 MED ORDER — SODIUM CHLORIDE 0.9 % IV SOLN
INTRAVENOUS | Status: DC | PRN
  Administered 2018-04-12: 30 ug/min via INTRAVENOUS

## 2018-04-12 MED ORDER — METHOCARBAMOL 500 MG PO TABS: 500.0000 mg | ORAL_TABLET | Freq: Four times a day (QID) | ORAL | Status: DC | PRN

## 2018-04-12 MED ORDER — PROPOFOL 10 MG/ML IV BOLUS
INTRAVENOUS | Status: DC | PRN
  Administered 2018-04-12: 40 mg via INTRAVENOUS
  Administered 2018-04-12: 120 mg via INTRAVENOUS

## 2018-04-12 MED ORDER — ONDANSETRON HCL 4 MG PO TABS: 4.0000 mg | ORAL_TABLET | Freq: Four times a day (QID) | ORAL | Status: DC | PRN

## 2018-04-12 MED ORDER — METHOCARBAMOL 1000 MG/10ML IJ SOLN
500.0000 mg | Freq: Four times a day (QID) | INTRAVENOUS | Status: DC | PRN
  Filled 2018-04-12: qty 5

## 2018-04-12 MED ORDER — ROCURONIUM BROMIDE 50 MG/5ML IV SOSY
PREFILLED_SYRINGE | INTRAVENOUS | Status: AC
  Filled 2018-04-12: qty 5

## 2018-04-12 MED ORDER — BUPIVACAINE HCL (PF) 0.25 % IJ SOLN
INTRAMUSCULAR | Status: AC
  Filled 2018-04-12: qty 30

## 2018-04-12 MED ORDER — DEXAMETHASONE SODIUM PHOSPHATE 10 MG/ML IJ SOLN
INTRAMUSCULAR | Status: AC
  Filled 2018-04-12: qty 1

## 2018-04-12 MED ORDER — LACTATED RINGERS IV SOLN
INTRAVENOUS | Status: DC | PRN
  Administered 2018-04-12: 12:00:00 via INTRAVENOUS

## 2018-04-12 MED ORDER — LEVOTHYROXINE SODIUM 100 MCG PO TABS: 50.0000 ug | ORAL_TABLET | ORAL | Status: DC

## 2018-04-12 MED ORDER — DEXAMETHASONE SODIUM PHOSPHATE 10 MG/ML IJ SOLN
10.0000 mg | INTRAMUSCULAR | Status: AC
  Administered 2018-04-12: 10 mg via INTRAVENOUS
  Filled 2018-04-12: qty 1

## 2018-04-12 MED ORDER — THROMBIN 5000 UNITS EX SOLR
CUTANEOUS | Status: AC
  Filled 2018-04-12: qty 15000

## 2018-04-12 MED ORDER — 0.9 % SODIUM CHLORIDE (POUR BTL) OPTIME
TOPICAL | Status: DC | PRN
  Administered 2018-04-12: 1000 mL

## 2018-04-12 MED ORDER — LINAGLIPTIN 5 MG PO TABS
5.0000 mg | ORAL_TABLET | Freq: Every day | ORAL | Status: DC
  Filled 2018-04-12: qty 1

## 2018-04-12 MED ORDER — BUPIVACAINE HCL (PF) 0.25 % IJ SOLN
INTRAMUSCULAR | Status: DC | PRN
  Administered 2018-04-12: 7 mL

## 2018-04-12 MED ORDER — GABAPENTIN 300 MG PO CAPS: 300.0000 mg | ORAL_CAPSULE | Freq: Two times a day (BID) | ORAL | Status: DC

## 2018-04-12 MED ORDER — PHENOL 1.4 % MT LIQD: 1.0000 | OROMUCOSAL | Status: DC | PRN

## 2018-04-12 MED ORDER — VANCOMYCIN HCL IN DEXTROSE 750-5 MG/150ML-% IV SOLN: 750.0000 mg | Freq: Once | INTRAVENOUS | Status: DC

## 2018-04-12 MED ORDER — MEPERIDINE HCL 50 MG/ML IJ SOLN: 6.2500 mg | INTRAMUSCULAR | Status: DC | PRN

## 2018-04-12 MED ORDER — CHLORHEXIDINE GLUCONATE CLOTH 2 % EX PADS: 6.0000 | MEDICATED_PAD | Freq: Once | CUTANEOUS | Status: DC

## 2018-04-12 MED ORDER — VANCOMYCIN HCL IN DEXTROSE 1-5 GM/200ML-% IV SOLN
1000.0000 mg | INTRAVENOUS | Status: AC
  Administered 2018-04-12: 1000 mg via INTRAVENOUS

## 2018-04-12 MED ORDER — ACETAMINOPHEN 650 MG RE SUPP: 650.0000 mg | RECTAL | Status: DC | PRN

## 2018-04-12 MED ORDER — HEMOSTATIC AGENTS (NO CHARGE) OPTIME
TOPICAL | Status: DC | PRN
  Administered 2018-04-12: 1 via TOPICAL

## 2018-04-12 MED ORDER — LIDOCAINE 2% (20 MG/ML) 5 ML SYRINGE
INTRAMUSCULAR | Status: AC
  Filled 2018-04-12: qty 5

## 2018-04-12 MED ORDER — SODIUM CHLORIDE 0.9 % IV SOLN: 250.0000 mL | INTRAVENOUS | Status: DC

## 2018-04-12 MED ORDER — SODIUM CHLORIDE 0.9% FLUSH: 3.0000 mL | Freq: Two times a day (BID) | INTRAVENOUS | Status: DC

## 2018-04-12 MED ORDER — POTASSIUM CHLORIDE IN NACL 20-0.9 MEQ/L-% IV SOLN: INTRAVENOUS | Status: DC

## 2018-04-12 MED ORDER — PROPOFOL 10 MG/ML IV BOLUS
INTRAVENOUS | Status: AC
  Filled 2018-04-12: qty 20

## 2018-04-12 MED ORDER — ONDANSETRON HCL 4 MG/2ML IJ SOLN: 4.0000 mg | Freq: Four times a day (QID) | INTRAMUSCULAR | Status: DC | PRN

## 2018-04-12 MED ORDER — PHENYLEPHRINE HCL 10 MG/ML IJ SOLN
INTRAMUSCULAR | Status: AC
  Filled 2018-04-12: qty 1

## 2018-04-12 MED ORDER — ONDANSETRON HCL 4 MG/2ML IJ SOLN
INTRAMUSCULAR | Status: AC
  Filled 2018-04-12: qty 2

## 2018-04-12 MED ORDER — INSULIN ASPART 100 UNIT/ML ~~LOC~~ SOLN: 0.0000 [IU] | Freq: Three times a day (TID) | SUBCUTANEOUS | Status: DC

## 2018-04-12 MED ORDER — FENTANYL CITRATE (PF) 250 MCG/5ML IJ SOLN
INTRAMUSCULAR | Status: AC
  Filled 2018-04-12: qty 5

## 2018-04-12 MED ORDER — AMLODIPINE BESYLATE 5 MG PO TABS: 2.5000 mg | ORAL_TABLET | Freq: Every day | ORAL | Status: DC

## 2018-04-12 MED ORDER — HYDROCODONE-ACETAMINOPHEN 5-325 MG PO TABS: 1.0000 | ORAL_TABLET | ORAL | 0 refills | Status: DC | PRN

## 2018-04-12 MED ORDER — MORPHINE SULFATE (PF) 2 MG/ML IV SOLN: 1.0000 mg | INTRAVENOUS | Status: DC | PRN

## 2018-04-12 MED ORDER — THROMBIN 5000 UNITS EX SOLR
CUTANEOUS | Status: DC | PRN
  Administered 2018-04-12 (×2): 5000 [IU] via TOPICAL

## 2018-04-12 MED ORDER — ESMOLOL HCL 100 MG/10ML IV SOLN
INTRAVENOUS | Status: AC
  Filled 2018-04-12: qty 10

## 2018-04-12 MED ORDER — PHENYLEPHRINE 40 MCG/ML (10ML) SYRINGE FOR IV PUSH (FOR BLOOD PRESSURE SUPPORT)
PREFILLED_SYRINGE | INTRAVENOUS | Status: DC | PRN
  Administered 2018-04-12: 40 ug via INTRAVENOUS

## 2018-04-12 MED ORDER — ROCURONIUM BROMIDE 10 MG/ML (PF) SYRINGE
PREFILLED_SYRINGE | INTRAVENOUS | Status: DC | PRN
  Administered 2018-04-12: 50 mg via INTRAVENOUS

## 2018-04-12 MED ORDER — MENTHOL 3 MG MT LOZG: 1.0000 | LOZENGE | OROMUCOSAL | Status: DC | PRN

## 2018-04-12 MED ORDER — ONDANSETRON HCL 4 MG/2ML IJ SOLN
INTRAMUSCULAR | Status: DC | PRN
  Administered 2018-04-12: 4 mg via INTRAVENOUS

## 2018-04-12 MED ORDER — VANCOMYCIN HCL IN DEXTROSE 1-5 GM/200ML-% IV SOLN
INTRAVENOUS | Status: AC
  Administered 2018-04-12: 1000 mg via INTRAVENOUS
  Filled 2018-04-12: qty 200

## 2018-04-12 MED ORDER — LIDOCAINE 2% (20 MG/ML) 5 ML SYRINGE
INTRAMUSCULAR | Status: DC | PRN
  Administered 2018-04-12: 60 mg via INTRAVENOUS
  Administered 2018-04-12: 40 mg via INTRAVENOUS

## 2018-04-12 MED ORDER — SUGAMMADEX SODIUM 200 MG/2ML IV SOLN
INTRAVENOUS | Status: DC | PRN
  Administered 2018-04-12: 200 mg via INTRAVENOUS

## 2018-04-12 MED ORDER — SODIUM CHLORIDE 0.9 % IV SOLN
INTRAVENOUS | Status: DC | PRN
  Administered 2018-04-12: 12:00:00

## 2018-04-12 MED ORDER — SENNA 8.6 MG PO TABS: 1.0000 | ORAL_TABLET | Freq: Two times a day (BID) | ORAL | Status: DC

## 2018-04-12 SURGICAL SUPPLY — 45 items
BAG DECANTER FOR FLEXI CONT (MISCELLANEOUS) ×2 IMPLANT
BENZOIN TINCTURE PRP APPL 2/3 (GAUZE/BANDAGES/DRESSINGS) ×2 IMPLANT
BUR MATCHSTICK NEURO 3.0 LAGG (BURR) ×2 IMPLANT
CANISTER SUCT 3000ML PPV (MISCELLANEOUS) ×2 IMPLANT
CARTRIDGE OIL MAESTRO DRILL (MISCELLANEOUS) ×1 IMPLANT
COVER WAND RF STERILE (DRAPES) IMPLANT
DERMABOND ADVANCED (GAUZE/BANDAGES/DRESSINGS) ×1
DERMABOND ADVANCED .7 DNX12 (GAUZE/BANDAGES/DRESSINGS) ×1 IMPLANT
DIFFUSER DRILL AIR PNEUMATIC (MISCELLANEOUS) ×2 IMPLANT
DRAPE LAPAROTOMY 100X72X124 (DRAPES) ×2 IMPLANT
DRAPE MICROSCOPE LEICA (MISCELLANEOUS) IMPLANT
DRAPE POUCH INSTRU U-SHP 10X18 (DRAPES) ×2 IMPLANT
DRAPE SURG 17X23 STRL (DRAPES) ×2 IMPLANT
DURAPREP 26ML APPLICATOR (WOUND CARE) ×2 IMPLANT
ELECT REM PT RETURN 9FT ADLT (ELECTROSURGICAL) ×2
ELECTRODE REM PT RTRN 9FT ADLT (ELECTROSURGICAL) ×1 IMPLANT
GAUZE 4X4 16PLY RFD (DISPOSABLE) IMPLANT
GLOVE BIO SURGEON STRL SZ7 (GLOVE) IMPLANT
GLOVE BIO SURGEON STRL SZ8 (GLOVE) ×2 IMPLANT
GLOVE BIOGEL PI IND STRL 7.0 (GLOVE) IMPLANT
GLOVE BIOGEL PI INDICATOR 7.0 (GLOVE)
GOWN STRL REUS W/ TWL LRG LVL3 (GOWN DISPOSABLE) ×2 IMPLANT
GOWN STRL REUS W/ TWL XL LVL3 (GOWN DISPOSABLE) ×1 IMPLANT
GOWN STRL REUS W/TWL 2XL LVL3 (GOWN DISPOSABLE) IMPLANT
GOWN STRL REUS W/TWL LRG LVL3 (GOWN DISPOSABLE) ×2
GOWN STRL REUS W/TWL XL LVL3 (GOWN DISPOSABLE) ×1
HEMOSTAT POWDER KIT SURGIFOAM (HEMOSTASIS) IMPLANT
KIT BASIN OR (CUSTOM PROCEDURE TRAY) ×2 IMPLANT
KIT TURNOVER KIT B (KITS) ×2 IMPLANT
NEEDLE HYPO 25X1 1.5 SAFETY (NEEDLE) ×2 IMPLANT
NEEDLE SPNL 20GX3.5 QUINCKE YW (NEEDLE) IMPLANT
NS IRRIG 1000ML POUR BTL (IV SOLUTION) ×2 IMPLANT
OIL CARTRIDGE MAESTRO DRILL (MISCELLANEOUS) ×2
PACK LAMINECTOMY NEURO (CUSTOM PROCEDURE TRAY) ×2 IMPLANT
PAD ARMBOARD 7.5X6 YLW CONV (MISCELLANEOUS) ×6 IMPLANT
RUBBERBAND STERILE (MISCELLANEOUS) IMPLANT
SPONGE SURGIFOAM ABS GEL SZ50 (HEMOSTASIS) ×2 IMPLANT
STRIP CLOSURE SKIN 1/2X4 (GAUZE/BANDAGES/DRESSINGS) ×2 IMPLANT
SUT VIC AB 0 CT1 18XCR BRD8 (SUTURE) ×1 IMPLANT
SUT VIC AB 0 CT1 8-18 (SUTURE) ×1
SUT VIC AB 2-0 CP2 18 (SUTURE) ×2 IMPLANT
SUT VIC AB 3-0 SH 8-18 (SUTURE) ×2 IMPLANT
TOWEL GREEN STERILE (TOWEL DISPOSABLE) ×2 IMPLANT
TOWEL GREEN STERILE FF (TOWEL DISPOSABLE) ×2 IMPLANT
WATER STERILE IRR 1000ML POUR (IV SOLUTION) ×2 IMPLANT

## 2018-04-12 NOTE — Addendum Note (Signed)
Addendum  created 04/12/18 1705 by Milford Cage, CRNA   Intraprocedure Event deleted, Intraprocedure Event edited, Intraprocedure Meds edited

## 2018-04-12 NOTE — Progress Notes (Signed)
Pt doing well. Pt and daughters were given D/C instructions with Rx, verbal understanding was provided. Pt's incision is clean and dry with no sign of infection. Pt's IV was removed prior to D/C. Pt D/C'd home via wheelchair @1715  per MD order. Pt is stable @ D/C and has no other needs at this time. Holli Humbles, RN

## 2018-04-12 NOTE — Anesthesia Postprocedure Evaluation (Signed)
Anesthesia Post Note  Patient: Hannah [name]  Procedure(s) Performed: Laminectomy and Foraminotomy - Lumbar four-Lumbar five - Lumbar five-Sacral one - right (Right Back)     Patient location during evaluation: PACU Anesthesia Type: General Level of consciousness: awake and alert Pain management: pain level controlled Vital Signs Assessment: post-procedure vital signs reviewed and stable Respiratory status: spontaneous breathing, nonlabored ventilation and respiratory function stable Cardiovascular status: blood pressure returned to baseline and stable Postop Assessment: no apparent nausea or vomiting Anesthetic complications: no    Last Vitals:  Vitals:   04/12/18 1506 04/12/18 1529  BP:  (!) 164/58  Pulse: 72 67  Resp: 12 16  Temp:  36.8 C  SpO2: 91% 93%    Last Pain:  Vitals:   04/12/18 1545  TempSrc:   PainSc: 0-No pain                 Ladan Vanderzanden,W. EDMOND

## 2018-04-12 NOTE — Op Note (Signed)
04/12/2018  1:55 PM  PATIENT:  Hannah [name]  84 y.o. female  PRE-OPERATIVE DIAGNOSIS: Lumbar spinal stenosis L4-5 L5-S1 with right leg pain  POST-OPERATIVE DIAGNOSIS:  same  PROCEDURE: Right L4-5 and L5-S1 hemilaminectomy, facetectomy and foraminotomy with sublaminar decompression at L4-5 for central canal decompression  SURGEON:  Sherley Bounds, MD  ASSISTANTS: ostergard md  ANESTHESIA:   General  EBL: less than 25 ml  No intake/output data recorded.  BLOOD ADMINISTERED: none  DRAINS: none  SPECIMEN:  none  INDICATION FOR PROCEDURE: This patient presented with the right leg pain. Imaging showed severe stenosis L4-5 and L5-S1. The patient tried conservative measures without relief. Pain was debilitating. Recommended right L4-5 and L5-S1 decompressive hemilaminectomy. Patient understood the risks, benefits, and alternatives and potential outcomes and wished to proceed.  PROCEDURE DETAILS: The patient was taken to the operating room and after induction of adequate generalized endotracheal anesthesia, the patient was rolled into the prone position on the Wilson frame and all pressure points were padded. The lumbar region was cleaned and then prepped with DuraPrep and draped in the usual sterile fashion. 5 cc of local anesthesia was injected and then a dorsal midline incision was made and carried down to the lumbo sacral fascia. The fascia was opened and the paraspinous musculature was taken down in a subperiosteal fashion to expose L4-5 and L5-S1 on the right. Intraoperative x-ray confirmed my level, and then I used a combination of the high-speed drill and the Kerrison punches to perform a hemilaminectomy, medial facetectomy, and foraminotomy at L4-5 and L5-S1 on the right. The underlying yellow ligament was opened and removed in a piecemeal fashion to expose the underlying dura and exiting nerve root. I undercut the lateral recess and dissected down until I was medial to and distal to  the pedicle. The nerve root was well decompressed.  He had severe right lateral recess stenosis at L5-S1 and may be even partially synovial cyst, had severe central stenosis as well as lateral recess stenosis at L4-5.  I did undercut the L4-5 spinous process and drilled up under the lamina and removed the yellow ligament from the left-hand side in the central canal at L4-5. we then gently retracted the nerve root medially with a retractor, coagulated the epidural venous vasculature, and inspected the disc space.  Film no significant disc herniation that I felt needed to be addressed.  I then palpated with a coronary dilator along the nerve root and into the foramen to assure adequate decompression. I felt no more compression of the nerve root. I irrigated with saline solution containing bacitracin. Achieved hemostasis with bipolar cautery, lined the dura with Gelfoam, and then closed the fascia with 0 Vicryl. I closed the subcutaneous tissues with 2-0 Vicryl and the subcuticular tissues with 3-0 Vicryl. The skin was then closed with benzoin and Steri-Strips. The drapes were removed, a sterile dressing was applied. The patient was awakened from general anesthesia and transferred to the recovery room in stable condition. At the end of the procedure all sponge, needle and instrument counts were correct.    PLAN OF CARE: Admit for overnight observation  PATIENT DISPOSITION:  PACU - hemodynamically stable.   Delay start of Pharmacological VTE agent (>24hrs) due to surgical blood loss or risk of bleeding:  yes

## 2018-04-12 NOTE — Anesthesia Preprocedure Evaluation (Addendum)
Anesthesia Evaluation  Patient identified by MRN, date of birth, ID band Patient awake    Reviewed: Allergy & Precautions, H&P , NPO status , Patient's Chart, lab work & pertinent test results, reviewed documented beta blocker date and time   History of Anesthesia Complications (+) Family history of anesthesia reaction and history of anesthetic complications  Airway Mallampati: III  TM Distance: >3 FB Neck ROM: Limited  Mouth opening: Limited Mouth Opening  Dental no notable dental hx. (+) Edentulous Upper, Edentulous Lower RESECTION OF ORAL CANCER  SELECTIVE NECK DISSECTION  FREE FLAP RADIAL FOREARM  SPLIT THICKNESS SKIN GRAFT LEG :   Pulmonary neg pulmonary ROS,    Pulmonary exam normal breath sounds clear to auscultation       Cardiovascular Exercise Tolerance: Good hypertension, Pt. on medications  Rhythm:regular Rate:Normal     Neuro/Psych PSYCHIATRIC DISORDERS Depression negative neurological ROS     GI/Hepatic negative GI ROS, Neg liver ROS,   Endo/Other  diabetes, Type 2Hypothyroidism   Renal/GU negative Renal ROS  negative genitourinary   Musculoskeletal negative musculoskeletal ROS (+)   Abdominal   Peds  Hematology negative hematology ROS (+)   Anesthesia Other Findings RESECTION OF ORAL CANCER  SELECTIVE NECK DISSECTION  FREE FLAP RADIAL FOREARM  SPLIT THICKNESS SKIN GRAFT LEG   Reproductive/Obstetrics negative OB ROS                           Anesthesia Physical Anesthesia Plan  ASA: III  Anesthesia Plan: General   Post-op Pain Management:    Induction: Intravenous  PONV Risk Score and Plan: 3 and Ondansetron, Treatment may vary due to age or medical condition and Midazolam  Airway Management Planned: Oral ETT and Video Laryngoscope Planned  Additional Equipment:   Intra-op Plan:   Post-operative Plan: Extubation in OR  Informed Consent: I have  reviewed the patients History and Physical, chart, labs and discussed the procedure including the risks, benefits and alternatives for the proposed anesthesia with the patient or authorized representative who has indicated his/her understanding and acceptance.   Dental Advisory Given  Plan Discussed with: CRNA, Anesthesiologist and Surgeon  Anesthesia Plan Comments: (  )      Anesthesia Quick Evaluation

## 2018-04-12 NOTE — Anesthesia Procedure Notes (Signed)
Procedure Name: Intubation Performed by: Milford Cage, CRNA Pre-anesthesia Checklist: Patient identified, Emergency Drugs available, Suction available and Patient being monitored Patient Re-evaluated:Patient Re-evaluated prior to induction Oxygen Delivery Method: Circle System Utilized Preoxygenation: Pre-oxygenation with 100% oxygen Induction Type: IV induction Ventilation: Mask ventilation without difficulty Laryngoscope Size: Glidescope and 3 Grade View: Grade I Tube type: Oral Tube size: 6.5 mm Number of attempts: 1 Airway Equipment and Method: Stylet Placement Confirmation: ETT inserted through vocal cords under direct vision,  positive ETCO2 and breath sounds checked- equal and bilateral Secured at: 16 cm Tube secured with: Tape Dental Injury: Teeth and Oropharynx as per pre-operative assessment

## 2018-04-12 NOTE — Progress Notes (Addendum)
Pharmacy Antibiotic Note  Hannah [name] is a 84 y.o. female admitted on 04/12/2018 for lumbar surgery. Pharmacy has been consulted for vancomycin x 1 dose for post-op surgical prophylaxis.  Patient received pre-op dose around 1130.  SCr 0.91, CrCL 33 ml/min.  Although consult stated to give vancomycin dose 12 hours post-op, based on patient's renal clearance she does not need a dose until 24 hours later.   Plan: Vanc 750mg  IV x 1 tomorrow Pharmacy will sign off   Height: 5\' 2"  (157.5 cm) Weight: 98 lb 3.2 oz (44.5 kg) IBW/kg (Calculated) : 50.1  Temp (24hrs), Avg:97.7 F (36.5 C), Min:97.2 F (36.2 C), Max:98.2 F (36.8 C)  Recent Labs  Lab 04/06/18 1427  WBC 9.9  CREATININE 0.91    Estimated Creatinine Clearance: 32.9 mL/min (by C-G formula based on SCr of 0.91 mg/dL).    Allergies  Allergen Reactions  . Penicillins     UNSPECIFIED REACTION   . Sulfa Antibiotics     UNSPECIFIED REACTION       Drystan Reader D. [name] Marble, PharmD, BCPS, [place] 04/12/2018, 3:39 PM

## 2018-04-12 NOTE — Discharge Summary (Signed)
Physician Discharge Summary  Patient ID: Hannah [name] MRN: [id number] DOB/AGE: [birth year] 84 y.o.  Admit date: 04/12/2018 Discharge date: 04/12/2018  Admission Diagnoses: lumbar stenosis    Discharge Diagnoses: same   Discharged Condition: good  Hospital Course: The patient was admitted on 04/12/2018 and taken to the operating room where the patient underwent DLL L4-5 l5-s1. The patient tolerated the procedure well and was taken to the recovery room and then to the floor in stable condition. The hospital course was routine. There were no complications. The wound remained clean dry and intact. Pt had appropriate back soreness. No complaints of leg pain or new N/T/W. The patient remained afebrile with stable vital signs, and tolerated a regular diet. The patient continued to increase activities, and pain was well controlled with oral pain medications.   Consults: None  Significant Diagnostic Studies:  Results for orders placed or performed during the hospital encounter of 04/12/18  Glucose, capillary  Result Value Ref Range   Glucose-Capillary 109 (H) 70 - 99 mg/dL   Comment 1 Notify RN   Glucose, capillary  Result Value Ref Range   Glucose-Capillary 127 (H) 70 - 99 mg/dL    Chest 2 View  Result Date: 04/07/2018 CLINICAL DATA:  Preoperative chest x-ray.  Laminectomy scheduled. EXAM: CHEST - 2 VIEW COMPARISON:  Sep 27, 2012 FINDINGS: The heart size and mediastinal contours are within normal limits. Both lungs are clear. The visualized skeletal structures are unremarkable. IMPRESSION: No active cardiopulmonary disease. Electronically Signed   By: Dorise Bullion III M.D   On: 04/07/2018 02:26   Dg Lumbar Spine 2-3 Views  Result Date: 03/17/2018 CLINICAL DATA:  LOWER back pain and RIGHT leg pain for several months. No known injury. EXAM: LUMBAR SPINE - 2-3 VIEW COMPARISON:  Lumbar spine on 06/05/2008 FINDINGS: There has been progression of mild convex LEFT scoliosis and  associated degenerative changes. No acute fracture or spondylolisthesis. Visualized bowel gas pattern is nonobstructive. IMPRESSION: Progressed degenerative changes. No evidence for acute abnormality. Electronically Signed   By: Nolon Nations M.D.   On: 03/17/2018 12:02   Mr Lumbar Spine W Wo Contrast  Result Date: 03/24/2018 CLINICAL DATA:  84 y/o F; severe lower back pain radiating into the right lower extremity with numbness. History of oral cancer. Creatinine was obtained on site at [facility] at [address] Results: Creatinine 0.7 mg/dL. EXAM: MRI LUMBAR SPINE WITHOUT AND WITH CONTRAST TECHNIQUE: Multiplanar and multiecho pulse sequences of the lumbar spine were obtained without and with intravenous contrast. CONTRAST:  65mL MULTIHANCE GADOBENATE DIMEGLUMINE 529 MG/ML IV SOLN COMPARISON:  03/17/2018 lumbar spine radiographs. FINDINGS: Segmentation:  Standard. Alignment: Mild levocurvature with apex at L3. Normal lumbar lordosis without listhesis. Vertebrae: No fracture, evidence of discitis, or bone lesion. No abnormal enhancement. Small multilevel chronic endplate Schmorl's nodes throughout the lumbar spine. Conus medullaris and cauda equina: Conus extends to the L1-2 level. Conus and cauda equina appear normal. Paraspinal and other soft tissues: Negative. Disc levels: T12-L1: No significant disc displacement, foraminal stenosis, or canal stenosis. L1-2: Disc bulge and endplate marginal osteophytes eccentric to the right. Mild right foraminal stenosis. No canal stenosis. L2-3: Disc bulge and endplate marginal osteophytes eccentric to the right. Mild bilateral facet and ligamentum flavum hypertrophy. Mild bilateral foraminal stenosis. Mild canal stenosis. L3-4: Disc bulge and endplate marginal osteophytes eccentric to the right. Mild bilateral facet and ligamentum flavum hypertrophy. Mild bilateral foraminal stenosis. Mild canal stenosis. L4-5: Disc bulge and endplate marginal osteophytes  eccentric  to the left with prominent facet ligamentum flavum and facet hypertrophy. Mild right and moderate left foraminal stenosis. Severe canal stenosis. L5-S1: Disc bulge and right subarticular disc protrusion with advanced facet and ligamentum flavum hypertrophy. Severe right and moderate left foraminal stenosis. Moderate canal stenosis. The disc protrusion and facet hypertrophy narrows the right lateral recess with impingement of the descending right S1 nerve root (series 5, image 29). IMPRESSION: 1. No acute osseous abnormality or enhancement of the lumbar spine. 2. Lumbar spondylosis with advanced disc and facet degenerative changes at the L4-5 and L5-S1 level. 3. L4-5 severe and L5-S1 moderate spinal canal stenosis. Multilevel mild spinal canal stenosis. 4. L5-S1 right subarticular disc protrusion combined with prominent right-sided facet hypertrophy results in right lateral recess stenosis and impingement of descending right S1 nerve root. 5. Severe right L5-S1 foraminal stenosis. Multilevel mild and moderate foraminal stenosis. Electronically Signed   By: Kristine Garbe M.D.   On: 03/24/2018 22:00   Dg Lumbar Spine 1 View  Result Date: 04/12/2018 CLINICAL DATA:  L4 through S1 laminectomy EXAM: LUMBAR SPINE - 1 VIEW COMPARISON:  Lumbar MRI 03/24/2018 FINDINGS: Single lateral lumbar image in the operating room Tissue spreaders posterior to the L5 spinous process. Surgical instrument at the level the L5 lamina. Multilevel lumbar degenerative change. IMPRESSION: L5 lamina localized in the operating room. Electronically Signed   By: Franchot Gallo M.D.   On: 04/12/2018 13:07    Antibiotics:  Anti-infectives (From admission, onward)   Start     Dose/Rate Route Frequency Ordered Stop   04/13/18 1100  vancomycin (VANCOCIN) IVPB 750 mg/150 ml premix     750 mg 150 mL/hr over 60 Minutes Intravenous  Once 04/12/18 1541     04/12/18 2330  vancomycin (VANCOCIN) IVPB 750 mg/150 ml premix  Status:   Discontinued     750 mg 150 mL/hr over 60 Minutes Intravenous  Once 04/12/18 1537 04/12/18 1541   04/12/18 1134  bacitracin 50,000 Units in sodium chloride 0.9 % 500 mL irrigation  Status:  Discontinued       As needed 04/12/18 1236 04/12/18 1353   04/12/18 1015  vancomycin (VANCOCIN) IVPB 1000 mg/200 mL premix     1,000 mg 200 mL/hr over 60 Minutes Intravenous On call to O.R. 04/12/18 1005 04/12/18 1229      Discharge Exam: Blood pressure (!) 164/58, pulse 67, temperature 98.2 F (36.8 C), temperature source Oral, resp. rate 16, height 5\' 2"  (1.575 m), weight 44.5 kg, SpO2 93 %. Neurologic: Grossly normal dressing dry  Discharge Medications:   Allergies as of 04/12/2018      Reactions   Penicillins    UNSPECIFIED REACTION    Sulfa Antibiotics    UNSPECIFIED REACTION        Medication List    TAKE these medications   amLODipine 2.5 MG tablet Commonly known as:  NORVASC Take 2.5 mg by mouth daily.   aspirin 325 MG tablet Take 325 mg by mouth daily.   diclofenac sodium 1 % Gel Commonly known as:  VOLTAREN Apply 2 g topically 3 (three) times daily as needed (joint pain).   ezetimibe-simvastatin 10-20 MG tablet Commonly known as:  VYTORIN Take 1 tablet by mouth at bedtime.   gabapentin 300 MG capsule Commonly known as:  NEURONTIN Take 300 mg by mouth 2 (two) times daily.   HYDROcodone-acetaminophen 5-325 MG tablet Commonly known as:  NORCO/VICODIN Take 1 tablet by mouth every 4 (four) hours as needed for moderate pain ((score 4  to 6)).   hydrocortisone cream 1 % Apply 1 application topically as needed (rash).   ibuprofen 200 MG tablet Commonly known as:  ADVIL,MOTRIN Take 400 mg by mouth as needed for moderate pain.   levothyroxine 50 MCG tablet Commonly known as:  SYNTHROID, LEVOTHROID Take 25-50 mcg by mouth See admin instructions. Take 1/2 tablet (55mcg) alternating with 1 tablet (41mcg) every other day.   sertraline 50 MG tablet Commonly known as:   ZOLOFT Take 50 mg by mouth daily.   sitaGLIPtin 25 MG tablet Commonly known as:  JANUVIA Take 25 mg by mouth daily.       Disposition: home   Final Dx: LL for stenosis  Discharge Instructions     Remove dressing in 72 hours   Complete by:  As directed    Call MD for:  difficulty breathing, headache or visual disturbances   Complete by:  As directed    Call MD for:  persistant nausea and vomiting   Complete by:  As directed    Call MD for:  redness, tenderness, or signs of infection (pain, swelling, redness, odor or green/yellow discharge around incision site)   Complete by:  As directed    Call MD for:  severe uncontrolled pain   Complete by:  As directed    Call MD for:  temperature >100.4   Complete by:  As directed    Diet - low sodium heart healthy   Complete by:  As directed    Increase activity slowly   Complete by:  As directed       Follow-up Information    Eustace Moore, MD. Schedule an appointment as soon as possible for a visit in 2 week(s).   Specialty:  Neurosurgery Contact information: [address] [place] [address] [place] [address] [phone]            Signed: Eustace Moore 04/12/2018, 4:12 PM

## 2018-04-12 NOTE — Transfer of Care (Signed)
Immediate Anesthesia Transfer of Care Note  Patient: Hannah [name]  Procedure(s) Performed: Laminectomy and Foraminotomy - Lumbar four-Lumbar five - Lumbar five-Sacral one - right (Right Back)  Patient Location: PACU  Anesthesia Type:General  Level of Consciousness: awake, alert  and oriented  Airway & Oxygen Therapy: Patient Spontanous Breathing and Patient connected to face mask  Post-op Assessment: Report given to RN and Post -op Vital signs reviewed and stable  Post vital signs: Reviewed and stable  Last Vitals:  Vitals Value Taken Time  BP 148/70 04/12/2018  1:58 PM  Temp    Pulse 75 04/12/2018  1:58 PM  Resp 13 04/12/2018  1:58 PM  SpO2 95 % 04/12/2018  1:58 PM  Vitals shown include unvalidated device data.  Last Pain:  Vitals:   04/12/18 1121  TempSrc: Oral  PainSc: 5       Patients Stated Pain Goal: 4 (83/[birth year] 8264)  Complications: No apparent anesthesia complications

## 2018-04-12 NOTE — H&P (Signed)
Subjective: Patient is a 84 y.o. female admitted for R leg pain. Onset of symptoms was several months ago, gradually worsening since that time.  The pain is rated severe, and is located at the across the lower back and radiates to RLE. The pain is described as aching and occurs all day. The symptoms have been progressive. Symptoms are exacerbated by exercise. MRI or CT showed stenosis   Past Medical History:  Diagnosis Date  . Depression   . Diabetes mellitus   . Hypercholesteremia   . Hypertension   . Hypothyroidism   . Oral cancer ([place])   . Tuberculosis    1978    Past Surgical History:  Procedure Laterality Date  . COLONOSCOPY    . EYE SURGERY Bilateral    cataracts  . LIPOMA EXCISION     back  . OTHER SURGICAL HISTORY     squamous cell removal;skin graft  . TOOTH EXTRACTION     all pulled    Prior to Admission medications   Medication Sig Start Date End Date Taking? Authorizing Provider  amLODipine (NORVASC) 2.5 MG tablet Take 2.5 mg by mouth daily.    Yes [provider]  aspirin 325 MG tablet Take 325 mg by mouth daily.   Yes [provider]  diclofenac sodium (VOLTAREN) 1 % GEL Apply 2 g topically 3 (three) times daily as needed (joint pain).   Yes [provider]  ezetimibe-simvastatin (VYTORIN) 10-20 MG per tablet Take 1 tablet by mouth at bedtime.   Yes [provider]  gabapentin (NEURONTIN) 300 MG capsule Take 300 mg by mouth 2 (two) times daily.   Yes [provider]  hydrocortisone cream 1 % Apply 1 application topically as needed (rash).   Yes [provider]  ibuprofen (ADVIL,MOTRIN) 200 MG tablet Take 400 mg by mouth as needed for moderate pain.   Yes [provider]  levothyroxine (SYNTHROID, LEVOTHROID) 50 MCG tablet Take 25-50 mcg by mouth See admin instructions. Take 1/2 tablet (57mcg) alternating with 1 tablet (76mcg) every other day.   Yes [provider]  sertraline (ZOLOFT) 50 MG  tablet Take 50 mg by mouth daily.   Yes [provider]  sitaGLIPtin (JANUVIA) 25 MG tablet Take 25 mg by mouth daily.   Yes [provider]   Allergies  Allergen Reactions  . Penicillins     UNSPECIFIED REACTION   . Sulfa Antibiotics     UNSPECIFIED REACTION      Social History   Tobacco Use  . Smoking status: Never Smoker  . Smokeless tobacco: Never Used  Substance Use Topics  . Alcohol use: No    Alcohol/week: 0.0 standard drinks    Family History  Problem Relation Age of Onset  . Arthritis Mother        Late 2s  . Heart attack Father        Deceased, 67  . Heart disease Brother   . Hypertension Unknown   . Hyperlipidemia Unknown   . Diabetes type II Sister   . Diabetes Brother      Review of Systems  Positive ROS: neg  All other systems have been reviewed and were otherwise negative with the exception of those mentioned in the HPI and as above.  Objective: Vital signs in last 24 hours: Temp:  [98 F (36.7 C)] 98 F (36.7 C) (11/27 1121) Pulse Rate:  [69] 69 (11/27 1121) Resp:  [18] 18 (11/27 1121) BP: (160)/(72) 160/72 (11/27 1121) SpO2:  [  97 %] 97 % (11/27 1121) Weight:  [44.5 kg] 44.5 kg (11/27 1121)  General Appearance: Alert, cooperative, no distress, appears stated age Head: Normocephalic, without obvious abnormality, atraumatic Eyes: PERRL, conjunctiva/corneas clear, EOM's intact    Neck: Supple, symmetrical, trachea midline Back: Symmetric, no curvature, ROM normal, no CVA tenderness Lungs:  respirations unlabored Heart: Regular rate and rhythm Abdomen: Soft, non-tender Extremities: Extremities normal, atraumatic, no cyanosis or edema Pulses: 2+ and symmetric all extremities Skin: Skin color, texture, turgor normal, no rashes or lesions  NEUROLOGIC:   Mental status: Alert and oriented x4,  no aphasia, good attention span, fund of knowledge, and memory Motor Exam - grossly normal Sensory Exam - grossly normal Reflexes:  trace Coordination - grossly normal Gait - grossly normal Balance - grossly normal Cranial Nerves: I: smell Not tested  II: visual acuity  OS: nl    OD: nl  II: visual fields Full to confrontation  II: pupils Equal, round, reactive to light  III,VII: ptosis None  III,IV,VI: extraocular muscles  Full ROM  V: mastication Normal  V: facial light touch sensation  Normal  V,VII: corneal reflex  Present  VII: facial muscle function - upper  Normal  VII: facial muscle function - lower Normal  VIII: hearing Not tested  IX: soft palate elevation  Normal  IX,X: gag reflex Present  XI: trapezius strength  5/5  XI: sternocleidomastoid strength 5/5  XI: neck flexion strength  5/5  XII: tongue strength  Normal    Data Review Lab Results  Component Value Date   WBC 9.9 04/06/2018   HGB 11.6 (L) 04/06/2018   HCT 38.6 04/06/2018   MCV 87.1 04/06/2018   PLT 257 04/06/2018   Lab Results  Component Value Date   NA 138 04/06/2018   K 4.0 04/06/2018   CL 104 04/06/2018   CO2 26 04/06/2018   BUN 13 04/06/2018   CREATININE 0.91 04/06/2018   GLUCOSE 170 (H) 04/06/2018   Lab Results  Component Value Date   INR 1.00 04/06/2018    Assessment/Plan:  Estimated body mass index is 17.96 kg/m as calculated from the following:   Height as of this encounter: 5\' 2"  (1.575 m).   Weight as of this encounter: 44.5 kg. Patient admitted for LL L4-5 L5-s1 R. Patient has failed a reasonable attempt at conservative therapy.  I explained the condition and procedure to the patient and answered any questions.  Patient wishes to proceed with procedure as planned. Understands risks/ benefits and typical outcomes of procedure.   Xoe Hoe S 04/12/2018 11:45 AM

## 2018-04-13 ENCOUNTER — Encounter (HOSPITAL_COMMUNITY): Payer: Self-pay | Admitting: Neurological Surgery

## 2018-04-28 DIAGNOSIS — E785 Hyperlipidemia, unspecified: Secondary | ICD-10-CM | POA: Diagnosis not present

## 2018-04-28 DIAGNOSIS — F321 Major depressive disorder, single episode, moderate: Secondary | ICD-10-CM | POA: Diagnosis not present

## 2018-04-28 DIAGNOSIS — E039 Hypothyroidism, unspecified: Secondary | ICD-10-CM | POA: Diagnosis not present

## 2018-04-28 DIAGNOSIS — E440 Moderate protein-calorie malnutrition: Secondary | ICD-10-CM | POA: Diagnosis not present

## 2018-04-28 DIAGNOSIS — K120 Recurrent oral aphthae: Secondary | ICD-10-CM | POA: Diagnosis not present

## 2018-04-28 DIAGNOSIS — R413 Other amnesia: Secondary | ICD-10-CM | POA: Diagnosis not present

## 2018-04-28 DIAGNOSIS — I10 Essential (primary) hypertension: Secondary | ICD-10-CM | POA: Diagnosis not present

## 2018-04-28 DIAGNOSIS — E1169 Type 2 diabetes mellitus with other specified complication: Secondary | ICD-10-CM | POA: Diagnosis not present

## 2018-04-28 DIAGNOSIS — Z681 Body mass index (BMI) 19 or less, adult: Secondary | ICD-10-CM | POA: Diagnosis not present

## 2018-07-03 DIAGNOSIS — Z85818 Personal history of malignant neoplasm of other sites of lip, oral cavity, and pharynx: Secondary | ICD-10-CM | POA: Diagnosis not present

## 2018-07-03 DIAGNOSIS — Z08 Encounter for follow-up examination after completed treatment for malignant neoplasm: Secondary | ICD-10-CM | POA: Diagnosis not present

## 2018-07-03 DIAGNOSIS — C069 Malignant neoplasm of mouth, unspecified: Secondary | ICD-10-CM | POA: Diagnosis not present

## 2018-07-14 ENCOUNTER — Ambulatory Visit
Admission: RE | Admit: 2018-07-14 | Discharge: 2018-07-14 | Disposition: A | Discharge status: Home / Self Care | Payer: Medicare HMO | Source: Ambulatory Visit | Attending: Family Medicine | Admitting: Family Medicine

## 2018-07-14 ENCOUNTER — Other Ambulatory Visit: Payer: Self-pay | Admitting: Family Medicine

## 2018-07-14 DIAGNOSIS — R0789 Other chest pain: Secondary | ICD-10-CM

## 2018-07-14 DIAGNOSIS — R0781 Pleurodynia: Secondary | ICD-10-CM | POA: Diagnosis not present

## 2018-07-14 DIAGNOSIS — R0609 Other forms of dyspnea: Secondary | ICD-10-CM | POA: Diagnosis not present

## 2018-07-14 DIAGNOSIS — S299XXA Unspecified injury of thorax, initial encounter: Secondary | ICD-10-CM | POA: Diagnosis not present

## 2018-07-21 DIAGNOSIS — L308 Other specified dermatitis: Secondary | ICD-10-CM | POA: Diagnosis not present

## 2018-07-25 DIAGNOSIS — R0789 Other chest pain: Secondary | ICD-10-CM | POA: Diagnosis not present

## 2018-07-25 DIAGNOSIS — R42 Dizziness and giddiness: Secondary | ICD-10-CM | POA: Diagnosis not present

## 2018-07-25 DIAGNOSIS — R0609 Other forms of dyspnea: Secondary | ICD-10-CM | POA: Diagnosis not present

## 2018-07-27 NOTE — Progress Notes (Signed)
Subjective:   @Patient  ID@: Hannah [name], female    DOB: [birth year], 85 y.o.   MRN: [id number]  Chief Complaint  Patient presents with   Shortness of Breath   New Patient (Initial Visit)    chest tightness     Patient referred by Donald Prose for dyspnea on exertion.  HPI:   Patient with hypertension, hyperlipidemia, dementia, history of oral cancer now in remission, and type 2 diabetes.   Recently developed fatigue and dyspnea on exertion. Daughter has noticed that she has recently developed activity intolerance, wheezing, lack of energy, and shortness of breath.   Patient notices chest pain throughout the day irrespective of rest or exertion and can last all day. Previously tolerated activities 6 months ago, she is now unable to perform due to fatigue and shortness of breath. She has been losing weight as her daughter states that she does not want to eat.   Blood pressure has recently been on the lower side; therefore, her daughter has been giving her medications every other day. Hyperlipidemia is well controlled.   They have been trying to increase her weight since oral cancer; therefore, following high caloric diet and has now had to resume metformin and low dose Januvia. Diabetes has been stable. Oral cancer is attributed to high consumption of bettle nut.consumption. No tobacco use history. She is now in remission from oral cancer.  She also has dementia and has progressively worsening memory issues. Currently on Aricept. She does live with her daughter. Previously lived in [place].     Past Medical History:  Diagnosis Date   Depression    Diabetes mellitus    Hypercholesteremia    Hypertension    Hypothyroidism    Oral cancer ([place])    Tuberculosis    1978    Past Surgical History:  Procedure Laterality Date   COLONOSCOPY     EYE SURGERY Bilateral    cataracts   LIPOMA EXCISION     back   LUMBAR LAMINECTOMY/DECOMPRESSION MICRODISCECTOMY  Right 04/12/2018   Procedure: Laminectomy and Foraminotomy - Lumbar four-Lumbar five - Lumbar five-Sacral one - right;  Surgeon: Eustace Moore, MD;  Location: [facility];  Service: Neurosurgery;  Laterality: Right;   OTHER SURGICAL HISTORY     squamous cell removal;skin graft   TOOTH EXTRACTION     all pulled    Family History  Problem Relation Age of Onset   Arthritis Mother        Late 61s   Heart attack Father        Deceased, 53   Heart disease Brother    Hypertension Other    Hyperlipidemia Other    Diabetes type II Sister    Diabetes Brother     Social History   Socioeconomic History   Marital status: Widowed    Spouse name: Not on file   Number of children: 4   Years of education: Not on file   Highest education level: Not on file  Occupational History   Not on file  Social Needs   Financial resource strain: Not on file   Food insecurity:    Worry: Not on file    Inability: Not on file   Transportation needs:    Medical: Not on file    Non-medical: Not on file  Tobacco Use   Smoking status: Never Smoker   Smokeless tobacco: Never Used  Substance and Sexual Activity   Alcohol use: No    Alcohol/week:  0.0 standard drinks   Drug use: No   Sexual activity: Never  Lifestyle   Physical activity:    Days per week: Not on file    Minutes per session: Not on file   Stress: Not on file  Relationships   Social connections:    Talks on phone: Not on file    Gets together: Not on file    Attends religious service: Not on file    Active member of club or organization: Not on file    Attends meetings of clubs or organizations: Not on file    Relationship status: Not on file   Intimate partner violence:    Fear of current or ex partner: Not on file    Emotionally abused: Not on file    Physically abused: Not on file    Forced sexual activity: Not on file  Other Topics Concern   Not on file  Social History Narrative   Lives at home with  two daughters.       Current Outpatient Medications on File Prior to Visit  Medication Sig Dispense Refill   amLODipine (NORVASC) 2.5 MG tablet Take 2.5 mg by mouth daily.      augmented betamethasone dipropionate (DIPROLENE-AF) 0.05 % cream Apply 1 application topically daily.     cyanocobalamin 1000 MCG tablet Take 1,000 mcg by mouth daily.     diclofenac sodium (VOLTAREN) 1 % GEL Apply 2 g topically 3 (three) times daily as needed (joint pain).     donepezil (ARICEPT) 5 MG tablet Take 1 tablet by mouth daily.     ezetimibe-simvastatin (VYTORIN) 10-20 MG per tablet Take 1 tablet by mouth at bedtime.     Fluticasone Propionate 0.05 % LOTN Apply 1 application topically daily.     levothyroxine (SYNTHROID, LEVOTHROID) 50 MCG tablet Take 25-50 mcg by mouth See admin instructions. Take 1/2 tablet (70mg) alternating with 1 tablet (562m) every other day.     lidocaine (XYLOCAINE) 2 % solution Use as directed 3 mLs in the mouth or throat as needed.     sertraline (ZOLOFT) 50 MG tablet Take 50 mg by mouth daily.     sitaGLIPtin (JANUVIA) 25 MG tablet Take 25 mg by mouth daily.     gabapentin (NEURONTIN) 300 MG capsule Take 300 mg by mouth 2 (two) times daily.     HYDROcodone-acetaminophen (NORCO/VICODIN) 5-325 MG tablet Take 1 tablet by mouth every 4 (four) hours as needed for moderate pain ((score 4 to 6)). (Patient not taking: Reported on 07/28/2018) 30 tablet 0   hydrocortisone cream 1 % Apply 1 application topically as needed (rash).     ibuprofen (ADVIL,MOTRIN) 200 MG tablet Take 400 mg by mouth as needed for moderate pain.     No current facility-administered medications on file prior to visit.      Review of Systems  Constitution: Positive for decreased appetite, malaise/fatigue and weight loss. Negative for weight gain.  Eyes: Negative for visual disturbance.  Cardiovascular: Positive for chest pain (chest tightness) and dyspnea on exertion. Negative for claudication,  leg swelling, orthopnea, palpitations and syncope.  Respiratory: Negative for hemoptysis and wheezing.   Endocrine: Negative for cold intolerance and heat intolerance.  Hematologic/Lymphatic: Does not bruise/bleed easily.  Skin: Negative for nail changes.  Musculoskeletal: Negative for muscle weakness and myalgias.  Gastrointestinal: Negative for abdominal pain, change in bowel habit, nausea and vomiting.  Neurological: Negative for difficulty with concentration, dizziness, focal weakness and headaches.  Psychiatric/Behavioral: Negative for altered mental  status and suicidal ideas.  All other systems reviewed and are negative.      Objective:   Physical Exam  Constitutional: Vital signs are normal. She appears well-developed and well-nourished. No distress.  HENT:  Head: Atraumatic.  Eyes: Conjunctivae are normal.  Neck: Neck supple. No JVD present. No thyromegaly present.  Cardiovascular: Normal rate, regular rhythm, normal heart sounds and intact distal pulses. Exam reveals no gallop.  No murmur heard. Pulmonary/Chest: Effort normal and breath sounds normal. She exhibits tenderness and bony tenderness.    Abdominal: Soft. Bowel sounds are normal.  Musculoskeletal: Normal range of motion.        General: No edema.  Neurological: She is alert.  Skin: Skin is warm. She is not diaphoretic.  Psychiatric: She has a normal mood and affect.  Vitals reviewed.  Blood pressure (!) 105/55, pulse 77, height 5' 2"  (1.575 m), weight 85 lb 11.2 oz (38.9 kg), SpO2 96 %.    Cardiac studies:   EKG 07/28/2018: Normal sinus rhythm at 68 bpm, normal axis, no evidence of ischemia.  CXR 07/14/2018: No active cardiopulmonary disease. No rib fractures.    Recent Labs:  Labs 04/28/2018: Cholesterol 171, triglycerides 155, HDL 51, LDL 87.  Hemoglobin A1c 7.9.  CBC normal.  Creatinine 0.73, EGFR 78/92, potassium 4.1, CMP normal.  TSH 3.9.       Assessment & Recommendations:   1. Atypical  chest pain She is noted to have chest wall tenderness on exam particularly over rib. No rib fracture on CXR. Concerned for possible malignancy given her weight loss. No abnormalities on CXR. She does have multiple risk factors for CAD and given changes in her quality of life, feel that further evaluation is warranted; however, I have discussed with the patient that unless stress test is markedly abnormal, would pursue medical management. Do not think that she would do well with procedure or surgery. They verbalized understanding and agreeance. I will change ASA 325 to 81 mg daily. She can continue with taking BP meds every other day in view of soft BP.   2. Dyspnea on exertion Will exclude CAD etiology. Will obtain echocardiogram to exclude any structural abnormalities.   3. History of oral cancer In remission. Has been evaluated by Oncology since her continued weight loss.  4. 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   5. Fatigue, unspecified type Question if this is possibly underlying depression. Would also like to obtain Vitamin D level as possible etiology as well as testing stated above.  6. Weight loss Could also be related to depression. No abnormalities noted on CXR.  7. Dementia without behavioral disturbance, unspecified dementia type ([place]) Currently on Aricept and being managed by PCP. Safety was discussed.  Plan:  She will follow up after testing with Dr. Virgina Jock.  Thank you for referring this patient. Please do not hesitate to contact me for any questions.    *I have discussed this case with Dr. Virgina Jock and he personally examined the patient and participated in formulating the plan.Jeri Lager, MSN, APRN, FNP-C [facility], [place] Office: [phone] Fax: [phone]

## 2018-07-28 ENCOUNTER — Encounter: Payer: Self-pay | Admitting: Cardiology

## 2018-07-28 ENCOUNTER — Other Ambulatory Visit: Payer: Self-pay

## 2018-07-28 ENCOUNTER — Ambulatory Visit (INDEPENDENT_AMBULATORY_CARE_PROVIDER_SITE_OTHER): Payer: Medicare HMO | Admitting: Cardiology

## 2018-07-28 VITALS — BP 105/55 | HR 77 | Ht 62.0 in | Wt 85.7 lb

## 2018-07-28 DIAGNOSIS — R0789 Other chest pain: Secondary | ICD-10-CM | POA: Diagnosis not present

## 2018-07-28 DIAGNOSIS — Z85819 Personal history of malignant neoplasm of unspecified site of lip, oral cavity, and pharynx: Secondary | ICD-10-CM

## 2018-07-28 DIAGNOSIS — R634 Abnormal weight loss: Secondary | ICD-10-CM | POA: Diagnosis not present

## 2018-07-28 DIAGNOSIS — R0609 Other forms of dyspnea: Secondary | ICD-10-CM

## 2018-07-28 DIAGNOSIS — R5383 Other fatigue: Secondary | ICD-10-CM

## 2018-07-28 DIAGNOSIS — E113213 Type 2 diabetes mellitus with mild nonproliferative diabetic retinopathy with macular edema, bilateral: Secondary | ICD-10-CM | POA: Diagnosis not present

## 2018-07-28 DIAGNOSIS — F0390 Unspecified dementia without behavioral disturbance: Secondary | ICD-10-CM

## 2018-08-04 ENCOUNTER — Ambulatory Visit: Payer: Self-pay | Admitting: Cardiology

## 2018-08-07 ENCOUNTER — Ambulatory Visit: Payer: Medicare HMO

## 2018-08-07 ENCOUNTER — Other Ambulatory Visit: Payer: Self-pay

## 2018-08-07 DIAGNOSIS — R5383 Other fatigue: Secondary | ICD-10-CM | POA: Diagnosis not present

## 2018-08-07 DIAGNOSIS — R0609 Other forms of dyspnea: Secondary | ICD-10-CM | POA: Diagnosis not present

## 2018-08-08 ENCOUNTER — Other Ambulatory Visit: Payer: Medicare HMO

## 2018-08-11 ENCOUNTER — Other Ambulatory Visit: Payer: Medicare HMO

## 2018-09-08 ENCOUNTER — Ambulatory Visit: Payer: Medicare HMO | Admitting: Cardiology

## 2018-09-15 ENCOUNTER — Other Ambulatory Visit: Payer: Medicare HMO

## 2018-09-29 ENCOUNTER — Ambulatory Visit: Payer: Medicare HMO | Admitting: Cardiology

## 2018-10-04 ENCOUNTER — Other Ambulatory Visit: Payer: Medicare HMO

## 2018-10-16 DIAGNOSIS — I10 Essential (primary) hypertension: Secondary | ICD-10-CM | POA: Diagnosis not present

## 2018-10-16 DIAGNOSIS — E1169 Type 2 diabetes mellitus with other specified complication: Secondary | ICD-10-CM | POA: Diagnosis not present

## 2018-10-16 DIAGNOSIS — R0609 Other forms of dyspnea: Secondary | ICD-10-CM | POA: Diagnosis not present

## 2018-10-16 DIAGNOSIS — F321 Major depressive disorder, single episode, moderate: Secondary | ICD-10-CM | POA: Diagnosis not present

## 2018-10-16 DIAGNOSIS — E785 Hyperlipidemia, unspecified: Secondary | ICD-10-CM | POA: Diagnosis not present

## 2018-10-16 DIAGNOSIS — E440 Moderate protein-calorie malnutrition: Secondary | ICD-10-CM | POA: Diagnosis not present

## 2018-10-16 DIAGNOSIS — R413 Other amnesia: Secondary | ICD-10-CM | POA: Diagnosis not present

## 2018-10-16 DIAGNOSIS — Z0000 Encounter for general adult medical examination without abnormal findings: Secondary | ICD-10-CM | POA: Diagnosis not present

## 2018-10-16 DIAGNOSIS — E039 Hypothyroidism, unspecified: Secondary | ICD-10-CM | POA: Diagnosis not present

## 2018-10-26 ENCOUNTER — Other Ambulatory Visit: Payer: Self-pay

## 2018-10-26 ENCOUNTER — Ambulatory Visit (INDEPENDENT_AMBULATORY_CARE_PROVIDER_SITE_OTHER): Payer: Medicare HMO

## 2018-10-26 DIAGNOSIS — R0609 Other forms of dyspnea: Secondary | ICD-10-CM | POA: Diagnosis not present

## 2018-10-30 ENCOUNTER — Telehealth: Payer: Self-pay | Admitting: Cardiology

## 2018-10-30 DIAGNOSIS — R0609 Other forms of dyspnea: Secondary | ICD-10-CM | POA: Insufficient documentation

## 2018-10-30 DIAGNOSIS — I340 Nonrheumatic mitral (valve) insufficiency: Secondary | ICD-10-CM | POA: Insufficient documentation

## 2018-10-30 MED ORDER — FUROSEMIDE 20 MG PO TABS: 10.0000 mg | ORAL_TABLET | ORAL | 3 refills | Status: DC

## 2018-10-30 NOTE — Telephone Encounter (Signed)
Discussed echocardiogram results with patient's daughter. With continued symptoms of exertional dyspnea, started low dose lasix.  Office f/u in 2 weeks.  Nigel Mormon, MD [facility] [place] Pager: [phone] Office: [phone] If no answer Cell [phone]

## 2018-11-14 NOTE — Progress Notes (Signed)
Follow up visit  Subjective:   Hannah [name], female    DOB: 06/15/1932, 85 y.o.   MRN: [id number]   Chief Complaint  Patient presents with  . Shortness of Breath  . Follow-up    2wk    HPI  85 y/o [place] female with mild dementia, h/o oral cancer in remission, type 2 DM, depression, with worsening exertional dyspnea and fatigue symptoms.    Cardiac workup showed echocardiogram with normal LF, grade II diastolic dysfunction, no ischemia/infarct on stress test.   Patient is here with her daughter. Patient continues to have complaints of generalized fatigue, bodyache, and chest wall tenderness.    Past Medical History:  Diagnosis Date  . Depression   . Diabetes mellitus   . Hypercholesteremia   . Hypertension   . Hypothyroidism   . Oral cancer ([place])   . Tuberculosis    1978     Past Surgical History:  Procedure Laterality Date  . COLONOSCOPY    . EYE SURGERY Bilateral    cataracts  . LIPOMA EXCISION     back  . LUMBAR LAMINECTOMY/DECOMPRESSION MICRODISCECTOMY Right 04/12/2018   Procedure: Laminectomy and Foraminotomy - Lumbar four-Lumbar five - Lumbar five-Sacral one - right;  Surgeon: Eustace Moore, MD;  Location: [facility];  Service: Neurosurgery;  Laterality: Right;  . OTHER SURGICAL HISTORY     squamous cell removal;skin graft  . TOOTH EXTRACTION     all pulled     Social History   Socioeconomic History  . Marital status: Widowed    Spouse name: Not on file  . Number of children: 4  . Years of education: Not on file  . Highest education level: Not on file  Occupational History  . Not on file  Social Needs  . Financial resource strain: Not on file  . Food insecurity    Worry: Not on file    Inability: Not on file  . Transportation needs    Medical: Not on file    Non-medical: Not on file  Tobacco Use  . Smoking status: Never Smoker  . Smokeless tobacco: Never Used  Substance and Sexual Activity  . Alcohol use: No    Alcohol/week:  0.0 standard drinks  . Drug use: No  . Sexual activity: Never  Lifestyle  . Physical activity    Days per week: Not on file    Minutes per session: Not on file  . Stress: Not on file  Relationships  . Social Herbalist on phone: Not on file    Gets together: Not on file    Attends religious service: Not on file    Active member of club or organization: Not on file    Attends meetings of clubs or organizations: Not on file    Relationship status: Not on file  . Intimate partner violence    Fear of current or ex partner: Not on file    Emotionally abused: Not on file    Physically abused: Not on file    Forced sexual activity: Not on file  Other Topics Concern  . Not on file  Social History Narrative   Lives at home with two daughters.        Family History  Problem Relation Age of Onset  . Arthritis Mother        Late 30s  . Heart attack Father        Deceased, 36  . Heart disease Brother   .  Hypertension Other   . Hyperlipidemia Other   . Diabetes type II Sister   . Diabetes Brother      Current Outpatient Medications on File Prior to Visit  Medication Sig Dispense Refill  . amLODipine (NORVASC) 2.5 MG tablet Take 2.5 mg by mouth daily.     Marland Kitchen augmented betamethasone dipropionate (DIPROLENE-AF) 0.05 % cream Apply 1 application topically daily.    . cyanocobalamin 1000 MCG tablet Take 1,000 mcg by mouth daily.    . diclofenac sodium (VOLTAREN) 1 % GEL Apply 2 g topically 3 (three) times daily as needed (joint pain).    Marland Kitchen donepezil (ARICEPT) 5 MG tablet Take 1 tablet by mouth daily.    Marland Kitchen ezetimibe-simvastatin (VYTORIN) 10-20 MG per tablet Take 1 tablet by mouth at bedtime.    . Fluticasone Propionate 0.05 % LOTN Apply 1 application topically daily.    . furosemide (LASIX) 20 MG tablet Take 0.5 tablets (10 mg total) by mouth as directed. Take 1/2 tablet every other day/ every day as tolerated 30 tablet 3  . gabapentin (NEURONTIN) 300 MG capsule Take 300 mg  by mouth 2 (two) times daily.    Marland Kitchen HYDROcodone-acetaminophen (NORCO/VICODIN) 5-325 MG tablet Take 1 tablet by mouth every 4 (four) hours as needed for moderate pain ((score 4 to 6)). (Patient not taking: Reported on 07/28/2018) 30 tablet 0  . hydrocortisone cream 1 % Apply 1 application topically as needed (rash).    Marland Kitchen ibuprofen (ADVIL,MOTRIN) 200 MG tablet Take 400 mg by mouth as needed for moderate pain.    Marland Kitchen levothyroxine (SYNTHROID, LEVOTHROID) 50 MCG tablet Take 25-50 mcg by mouth See admin instructions. Take 1/2 tablet (32mg) alternating with 1 tablet (517m) every other day.    . lidocaine (XYLOCAINE) 2 % solution Use as directed 3 mLs in the mouth or throat as needed.    . sertraline (ZOLOFT) 50 MG tablet Take 50 mg by mouth daily.    . sitaGLIPtin (JANUVIA) 25 MG tablet Take 25 mg by mouth daily.     No current facility-administered medications on file prior to visit.     Cardiovascular studies:  Echocardiogram 10/26/2018 :  Normal LV systolic function with EF 65%. Left ventricle cavity is normal in size. Normal left ventricular wall thickness. Normal global wall motion. Doppler evidence of grade II (pseudonormal) diastolic dysfunction, elevated LAP. Calculated EF 65%. Left atrial cavity is mildly dilated with LA volume of 45 ml.  Normal appearance of the mitral valve. Mild to moderate mitral regurgitation.  Lexiscan myoview stress test 08/07/2018:  1. Lexiscan stress test was performed. Exercise capacity was not assessed. Stress symptoms included dizziness and headache. Resting blood pressure was 112/60 mmHg and peak effect blood pressure was 120/62 mmHg. The resting and stress electrocardiogram demonstrated normal sinus rhythm, normal conduction, no arrhythmias and normal repolarization. Stress EKG is non diagnostic for ischemia as it is a pharmacologic stress.  2. The overall quality of the study is good. There is no evidence of abnormal lung activity. Stress and rest SPECT images  demonstrate homogeneous tracer distribution throughout the myocardium. Gated SPECT imaging reveals normal myocardial thickening and wall motion. The left ventricular ejection fraction was normal (62%).   3. Low risk study.  Recent labs: 04/28/2018:  Cholesterol 171, triglycerides 155, HDL 51, LDL 87.   Hemoglobin A1c 7.9.  CBC normal.  Creatinine 0.73, EGFR 78/92, potassium 4.1, CMP normal.  TSH 3.9.    Review of Systems  Constitution: Positive for malaise/fatigue. Negative for decreased  appetite, weight gain and weight loss.  HENT: Negative for congestion.   Eyes: Negative for visual disturbance.  Cardiovascular: Positive for chest pain. Negative for dyspnea on exertion, leg swelling, palpitations and syncope.  Respiratory: Negative for cough.   Endocrine: Negative for cold intolerance.  Hematologic/Lymphatic: Does not bruise/bleed easily.  Skin: Negative for itching and rash.  Musculoskeletal: Negative for myalgias.       Generalized body ache  Gastrointestinal: Negative for abdominal pain, nausea and vomiting.  Genitourinary: Negative for dysuria.  Neurological: Positive for headaches. Negative for dizziness and weakness.  Psychiatric/Behavioral: The patient is not nervous/anxious.   All other systems reviewed and are negative.       Vitals:   11/15/18 0846  BP: 131/60  Pulse: 70  Temp: 98.4 F (36.9 C)  SpO2: 97%    Body mass index is 16.76 kg/m. Filed Weights   11/15/18 0846  Weight: 40.2 kg     Objective:   Physical Exam  Constitutional: She is oriented to person, place, and time. She appears well-developed. No distress.  Cachectic  HENT:  Head: Normocephalic and atraumatic.  Eyes: Pupils are equal, round, and reactive to light. Conjunctivae are normal.  Neck: No JVD present.  Cardiovascular: Normal rate, regular rhythm and intact distal pulses.  Pulmonary/Chest: Effort normal and breath sounds normal. She has no wheezes. She has no rales.  Abdominal:  Soft. Bowel sounds are normal. There is no rebound.  Lymphadenopathy:    She has no cervical adenopathy.  Neurological: She is alert and oriented to person, place, and time. No cranial nerve deficit.  Skin: Skin is warm and dry.  Psychiatric: She has a normal mood and affect.  Nursing note and vitals reviewed.         Assessment & Recommendations:   85 y/o [place] female with mild dementia, h/o oral cancer in remission, type 2 DM, depression, with worsening exertional dyspnea and fatigue symptoms.    1. Fatigue, unspecified type Echocardiogram and stress test do not entirely explain her symptoms. I had placed her on low dose diuretic, but she appears euvolumic. I am concerned that diuretic will likely cause dehydration, and more harm than benefit. Thus, I will stop it today. I will check CBC, CMP, TSH, and Vit D3. I will forward the results to PCP. Dr. Nancy Fetter to follow up on them,  I will see her on as needed basis.      Nigel Mormon, MD [facility] [place] Pager: [phone] Office: [phone] If no answer Cell [phone]

## 2018-11-15 ENCOUNTER — Ambulatory Visit (INDEPENDENT_AMBULATORY_CARE_PROVIDER_SITE_OTHER): Payer: Medicare HMO | Admitting: Cardiology

## 2018-11-15 ENCOUNTER — Other Ambulatory Visit: Payer: Self-pay

## 2018-11-15 ENCOUNTER — Encounter: Payer: Self-pay | Admitting: Cardiology

## 2018-11-15 VITALS — BP 131/60 | HR 70 | Temp 98.4°F | Ht 61.0 in | Wt 88.7 lb

## 2018-11-15 DIAGNOSIS — R5383 Other fatigue: Secondary | ICD-10-CM

## 2018-11-15 DIAGNOSIS — R0602 Shortness of breath: Secondary | ICD-10-CM | POA: Diagnosis not present

## 2018-11-20 LAB — COMPREHENSIVE METABOLIC PANEL
ALT: 12 IU/L (ref 0–32)
AST: 29 IU/L (ref 0–40)
Albumin/Globulin Ratio: 1.8 (ref 1.2–2.2)
Albumin: 4.5 g/dL (ref 3.6–4.6)
Alkaline Phosphatase: 71 IU/L (ref 39–117)
BUN/Creatinine Ratio: 19 (ref 12–28)
BUN: 17 mg/dL (ref 8–27)
Bilirubin Total: 0.2 mg/dL (ref 0.0–1.2)
CO2: 26 mmol/L (ref 20–29)
Calcium: 10.4 mg/dL — ABNORMAL HIGH (ref 8.7–10.3)
Chloride: 101 mmol/L (ref 96–106)
Creatinine, Ser: 0.91 mg/dL (ref 0.57–1.00)
GFR calc Af Amer: 67 mL/min/{1.73_m2} (ref >59)
GFR calc non Af Amer: 59 mL/min/{1.73_m2} — ABNORMAL LOW (ref >59)
Globulin, Total: 2.5 g/dL (ref 1.5–4.5)
Glucose: 103 mg/dL — ABNORMAL HIGH (ref 65–99)
Potassium: 5.5 mmol/L — ABNORMAL HIGH (ref 3.5–5.2)
Sodium: 142 mmol/L (ref 134–144)
Total Protein: 7 g/dL (ref 6.0–8.5)

## 2018-11-20 LAB — CBC
Hematocrit: 37.5 % (ref 34.0–46.6)
Hemoglobin: 12 g/dL (ref 11.1–15.9)
MCH: 26.9 pg (ref 26.6–33.0)
MCHC: 32 g/dL (ref 31.5–35.7)
MCV: 84 fL (ref 79–97)
Platelets: 247 10*3/uL (ref 150–450)
RBC: 4.46 x10E6/uL (ref 3.77–5.28)
RDW: 13.9 % (ref 11.7–15.4)
WBC: 10.3 10*3/uL (ref 3.4–10.8)

## 2018-11-20 LAB — VITAMIN D 1,25 DIHYDROXY
Vitamin D 1, 25 (OH)2 Total: 45 pg/mL
Vitamin D2 1, 25 (OH)2: <10 pg/mL
Vitamin D3 1, 25 (OH)2: 45 pg/mL

## 2018-11-20 LAB — TSH: TSH: 1.27 u[IU]/mL (ref 0.450–4.500)

## 2018-11-22 DIAGNOSIS — L218 Other seborrheic dermatitis: Secondary | ICD-10-CM | POA: Diagnosis not present

## 2018-11-22 DIAGNOSIS — L508 Other urticaria: Secondary | ICD-10-CM | POA: Diagnosis not present

## 2018-11-24 DIAGNOSIS — Z20828 Contact with and (suspected) exposure to other viral communicable diseases: Secondary | ICD-10-CM | POA: Diagnosis not present

## 2019-01-03 DIAGNOSIS — R0602 Shortness of breath: Secondary | ICD-10-CM | POA: Diagnosis not present

## 2019-01-03 DIAGNOSIS — Z681 Body mass index (BMI) 19 or less, adult: Secondary | ICD-10-CM | POA: Diagnosis not present

## 2019-01-03 DIAGNOSIS — R630 Anorexia: Secondary | ICD-10-CM | POA: Diagnosis not present

## 2019-01-03 DIAGNOSIS — E440 Moderate protein-calorie malnutrition: Secondary | ICD-10-CM | POA: Diagnosis not present

## 2019-01-03 DIAGNOSIS — I10 Essential (primary) hypertension: Secondary | ICD-10-CM | POA: Diagnosis not present

## 2019-02-14 ENCOUNTER — Institutional Professional Consult (permissible substitution): Payer: Medicare HMO | Admitting: Internal Medicine

## 2019-02-20 ENCOUNTER — Encounter: Payer: Self-pay | Admitting: Pulmonary Disease

## 2019-02-20 ENCOUNTER — Ambulatory Visit (INDEPENDENT_AMBULATORY_CARE_PROVIDER_SITE_OTHER): Payer: Medicare HMO | Admitting: Pulmonary Disease

## 2019-02-20 ENCOUNTER — Other Ambulatory Visit: Payer: Self-pay

## 2019-02-20 VITALS — BP 122/70 | HR 80 | Temp 97.2°F | Ht 62.0 in | Wt 95.6 lb

## 2019-02-20 DIAGNOSIS — R062 Wheezing: Secondary | ICD-10-CM

## 2019-02-20 DIAGNOSIS — R0602 Shortness of breath: Secondary | ICD-10-CM

## 2019-02-20 DIAGNOSIS — R0609 Other forms of dyspnea: Secondary | ICD-10-CM

## 2019-02-20 DIAGNOSIS — R0600 Dyspnea, unspecified: Secondary | ICD-10-CM | POA: Diagnosis not present

## 2019-02-20 MED ORDER — ALBUTEROL SULFATE HFA 108 (90 BASE) MCG/ACT IN AERS: 2.0000 | INHALATION_SPRAY | Freq: Four times a day (QID) | RESPIRATORY_TRACT | 3 refills | Status: DC | PRN

## 2019-02-20 MED ORDER — BUDESONIDE-FORMOTEROL FUMARATE 80-4.5 MCG/ACT IN AERO: 2.0000 | INHALATION_SPRAY | Freq: Two times a day (BID) | RESPIRATORY_TRACT | 0 refills | Status: DC

## 2019-02-20 NOTE — Patient Instructions (Addendum)
Thank you for visiting Dr. Valeta Harms at [facility]. Today we recommend the following: Orders Placed This Encounter  Procedures  . Pulmonary function test   Please start Symbicort 80/4.5  2 puffs twice daily  Return in about 8 weeks (around 04/17/2019).    Please do your part to reduce the spread of COVID-19.

## 2019-02-20 NOTE — Progress Notes (Signed)
Synopsis: Referred in October 2020 for shortness of breath by Donald Prose, MD  Subjective:   PATIENT ID: Hannah [name] GENDER: female DOB: 11/14/1932, MRN: [id number]  Chief Complaint  Patient presents with  . Consult    Consult for DOE. She reports it started in february. She reports it has slowly gotten worse.     PMH h/o of TB in 56. Born in [place], moved to [place], moved here in 1983. Started in feb noticed wheezing first. She noticed SOB of breath. She has been very fatigued at times after these events.  The biggest concern is been regarding her ongoing fatigue and dyspnea on exertion.  He gets to the point where she has to sit down and rest.  At this time the daughter notices that she wheezes.  The wheezing is audible.  Does not have a history of allergies.  She has a daughter with a history of hayfever that was diagnosed after her children but no other problems.  She has never had pulmonary function test.  She is a lifelong non-smoker.  No significant dust mold exposure.  No unusual hobbies.   Past Medical History:  Diagnosis Date  . Depression   . Diabetes mellitus   . Hypercholesteremia   . Hypertension   . Hypothyroidism   . Oral cancer ([place])   . Tuberculosis    1978     Family History  Problem Relation Age of Onset  . Arthritis Mother        Late 35s  . Heart attack Father        Deceased, 5  . Heart disease Brother   . Hypertension Other   . Hyperlipidemia Other   . Diabetes type II Sister   . Diabetes Brother      Past Surgical History:  Procedure Laterality Date  . COLONOSCOPY    . EYE SURGERY Bilateral    cataracts  . LIPOMA EXCISION     back  . LUMBAR LAMINECTOMY/DECOMPRESSION MICRODISCECTOMY Right 04/12/2018   Procedure: Laminectomy and Foraminotomy - Lumbar four-Lumbar five - Lumbar five-Sacral one - right;  Surgeon: Eustace Moore, MD;  Location: [facility];  Service: Neurosurgery;  Laterality: Right;  . OTHER SURGICAL HISTORY     squamous cell  removal;skin graft  . TOOTH EXTRACTION     all pulled    Social History   Socioeconomic History  . Marital status: Widowed    Spouse name: Not on file  . Number of children: 4  . Years of education: Not on file  . Highest education level: Not on file  Occupational History  . Not on file  Social Needs  . Financial resource strain: Not on file  . Food insecurity    Worry: Not on file    Inability: Not on file  . Transportation needs    Medical: Not on file    Non-medical: Not on file  Tobacco Use  . Smoking status: Never Smoker  . Smokeless tobacco: Never Used  Substance and Sexual Activity  . Alcohol use: No    Alcohol/week: 0.0 standard drinks  . Drug use: No  . Sexual activity: Never  Lifestyle  . Physical activity    Days per week: Not on file    Minutes per session: Not on file  . Stress: Not on file  Relationships  . Social Herbalist on phone: Not on file    Gets together: Not on file    Attends religious service:  Not on file    Active member of club or organization: Not on file    Attends meetings of clubs or organizations: Not on file    Relationship status: Not on file  . Intimate partner violence    Fear of current or ex partner: Not on file    Emotionally abused: Not on file    Physically abused: Not on file    Forced sexual activity: Not on file  Other Topics Concern  . Not on file  Social History Narrative   Lives at home with two daughters.        Allergies  Allergen Reactions  . Penicillins     UNSPECIFIED REACTION   . Sulfa Antibiotics     UNSPECIFIED REACTION       Outpatient Medications Prior to Visit  Medication Sig Dispense Refill  . acetaminophen (TYLENOL) 325 MG tablet Take 650 mg by mouth every 6 (six) hours as needed.    Marland Kitchen augmented betamethasone dipropionate (DIPROLENE-AF) 0.05 % cream Apply 1 application topically daily.    . cyanocobalamin 1000 MCG tablet Take 1,000 mcg by mouth daily.    . diclofenac sodium  (VOLTAREN) 1 % GEL Apply 2 g topically 3 (three) times daily as needed (joint pain).    Marland Kitchen donepezil (ARICEPT) 5 MG tablet Take 1 tablet by mouth daily.    Marland Kitchen ezetimibe-simvastatin (VYTORIN) 10-20 MG per tablet Take 1 tablet by mouth at bedtime.    . Fluticasone Propionate 0.05 % LOTN Apply 1 application topically as needed.     . hydrocortisone cream 1 % Apply 1 application topically as needed (rash).    Marland Kitchen levothyroxine (SYNTHROID, LEVOTHROID) 50 MCG tablet Take 25-50 mcg by mouth See admin instructions. Take 1/2 tablet (20mcg) alternating with 1 tablet (28mcg) every other day.    . lidocaine (XYLOCAINE) 2 % solution Use as directed 3 mLs in the mouth or throat as needed.    . sertraline (ZOLOFT) 100 MG tablet Take by mouth daily.     . sitaGLIPtin (JANUVIA) 25 MG tablet Take 25 mg by mouth daily.    . furosemide (LASIX) 20 MG tablet Take 0.5 tablets (10 mg total) by mouth as directed. Take 1/2 tablet every other day/ every day as tolerated 30 tablet 3   No facility-administered medications prior to visit.     Review of Systems  Constitutional: Positive for malaise/fatigue. Negative for chills, fever and weight loss.  HENT: Negative for hearing loss, sore throat and tinnitus.   Eyes: Negative for blurred vision and double vision.  Respiratory: Positive for shortness of breath and wheezing. Negative for cough, hemoptysis, sputum production and stridor.   Cardiovascular: Negative for chest pain, palpitations, orthopnea, leg swelling and PND.  Gastrointestinal: Negative for abdominal pain, constipation, diarrhea, heartburn, nausea and vomiting.  Genitourinary: Negative for dysuria, hematuria and urgency.  Musculoskeletal: Negative for joint pain and myalgias.  Skin: Negative for itching and rash.  Neurological: Negative for dizziness, tingling, weakness and headaches.  Endo/Heme/Allergies: Negative for environmental allergies. Does not bruise/bleed easily.  Psychiatric/Behavioral: Negative for  depression. The patient is not nervous/anxious and does not have insomnia.   All other systems reviewed and are negative.    Objective:  Physical Exam Vitals signs reviewed.  Constitutional:      General: She is not in acute distress.    Appearance: She is well-developed.  HENT:     Head: Normocephalic and atraumatic.  Eyes:     General: No scleral icterus.  Conjunctiva/sclera: Conjunctivae normal.     Pupils: Pupils are equal, round, and reactive to light.  Neck:     Musculoskeletal: Neck supple.     Vascular: No JVD.     Trachea: No tracheal deviation.  Cardiovascular:     Rate and Rhythm: Normal rate and regular rhythm.     Heart sounds: Normal heart sounds. No murmur.  Pulmonary:     Effort: Pulmonary effort is normal. No tachypnea, accessory muscle usage or respiratory distress.     Breath sounds: No stridor. No wheezing, rhonchi or rales.  Abdominal:     General: Bowel sounds are normal. There is no distension.     Palpations: Abdomen is soft.     Tenderness: There is no abdominal tenderness.  Musculoskeletal:        General: No tenderness.  Lymphadenopathy:     Cervical: No cervical adenopathy.  Skin:    General: Skin is warm and dry.     Capillary Refill: Capillary refill takes less than 2 seconds.     Findings: No rash.  Neurological:     Mental Status: She is alert and oriented to person, place, and time.  Psychiatric:        Behavior: Behavior normal.      Vitals:   02/20/19 1629  BP: 122/70  Pulse: 80  Temp: (!) 97.2 F (36.2 C)  TempSrc: Temporal  SpO2: 98%  Weight: 95 lb 9.6 oz (43.4 kg)  Height: 5\' 2"  (1.575 m)   98% on RA BMI Readings from Last 3 Encounters:  02/20/19 17.49 kg/m  11/15/18 16.76 kg/m  07/28/18 15.67 kg/m   Wt Readings from Last 3 Encounters:  02/20/19 95 lb 9.6 oz (43.4 kg)  11/15/18 88 lb 11.2 oz (40.2 kg)  07/28/18 85 lb 11.2 oz (38.9 kg)     CBC    Component Value Date/Time   WBC 10.3 11/15/2018 0945    WBC 9.9 04/06/2018 1427   RBC 4.46 11/15/2018 0945   RBC 4.43 04/06/2018 1427   HGB 12.0 11/15/2018 0945   HCT 37.5 11/15/2018 0945   PLT 247 11/15/2018 0945   MCV 84 11/15/2018 0945   MCH 26.9 11/15/2018 0945   MCH 26.2 04/06/2018 1427   MCHC 32.0 11/15/2018 0945   MCHC 30.1 04/06/2018 1427   RDW 13.9 11/15/2018 0945   LYMPHSABS 2.8 04/06/2018 1427   MONOABS 0.9 04/06/2018 1427   EOSABS 1.0 (H) 04/06/2018 1427   BASOSABS 0.1 04/06/2018 1427     Chest Imaging: February 2020: Chest x-ray Clear no active cardiopulmonary disease. The patient's images have been independently reviewed by me.    Pulmonary Functions Testing Results: No flowsheet data found.  FeNO: None   Pathology: None   Echocardiogram:   Echocardiogram 10/26/2018 :  Normal LV systolic function with EF 65%. Left ventricle cavity is normal in size. Normal left ventricular wall thickness. Normal global wall motion. Doppler evidence of grade II (pseudonormal) diastolic dysfunction, elevated LAP. Calculated EF 65%. Left atrial cavity is mildly dilated with LA volume of 45 ml.  Normal appearance of the mitral valve. Mild to moderate mitral regurgitation.  Heart Catheterization: None     Assessment & Plan:     ICD-10-CM   1. SOB (shortness of breath)  R06.02 Pulmonary function test  2. DOE (dyspnea on exertion)  R06.00   3. Wheezing  R06.2     Discussion:  85 year old female recent onset of wheezing started in February.  This is predominantly been associated  with her being outside or exerting herself around the home.  No clear prior history of allergies or asthma ever documented before.  No significant occupational or dust exposures.  No intolerance to smells.  She does however have wheezing that persists after these episodes.  Does appear to have some bronchospastic type component per symptomatology.  We will attempt to treat her with bronchodilators.  Of note she does have a history of tuberculosis and  potentially could be at risk for development of bronchiectasis.  May need to consider axial imaging of the chest pending on pulmonary function test.  I have reviewed her chest x-ray which is otherwise normal.  Plan: Pulmonary function tests return next office visit at approximately 8 weeks. We will give her samples of Symbicort to see if this makes any difference or change in symptoms. PRN albuterol for shortness of breath and wheezing Pending pulmonary function test we can consider other things such as axial imaging of the chest.  Return to clinic in 8 weeks  Greater than 50% of this patient's 45-minute of visit was spent face-to-face discussing the above recommendations and treatment plan.   Current Outpatient Medications:  .  acetaminophen (TYLENOL) 325 MG tablet, Take 650 mg by mouth every 6 (six) hours as needed., Disp: , Rfl:  .  augmented betamethasone dipropionate (DIPROLENE-AF) 0.05 % cream, Apply 1 application topically daily., Disp: , Rfl:  .  cyanocobalamin 1000 MCG tablet, Take 1,000 mcg by mouth daily., Disp: , Rfl:  .  diclofenac sodium (VOLTAREN) 1 % GEL, Apply 2 g topically 3 (three) times daily as needed (joint pain)., Disp: , Rfl:  .  donepezil (ARICEPT) 5 MG tablet, Take 1 tablet by mouth daily., Disp: , Rfl:  .  ezetimibe-simvastatin (VYTORIN) 10-20 MG per tablet, Take 1 tablet by mouth at bedtime., Disp: , Rfl:  .  Fluticasone Propionate 0.05 % LOTN, Apply 1 application topically as needed. , Disp: , Rfl:  .  hydrocortisone cream 1 %, Apply 1 application topically as needed (rash)., Disp: , Rfl:  .  levothyroxine (SYNTHROID, LEVOTHROID) 50 MCG tablet, Take 25-50 mcg by mouth See admin instructions. Take 1/2 tablet (46mcg) alternating with 1 tablet (75mcg) every other day., Disp: , Rfl:  .  lidocaine (XYLOCAINE) 2 % solution, Use as directed 3 mLs in the mouth or throat as needed., Disp: , Rfl:  .  sertraline (ZOLOFT) 100 MG tablet, Take by mouth daily. , Disp: , Rfl:  .   sitaGLIPtin (JANUVIA) 25 MG tablet, Take 25 mg by mouth daily., Disp: , Rfl:    Garner Nash, DO [facility] 02/20/2019 4:47 PM

## 2019-03-19 DIAGNOSIS — R413 Other amnesia: Secondary | ICD-10-CM | POA: Diagnosis not present

## 2019-03-19 DIAGNOSIS — E039 Hypothyroidism, unspecified: Secondary | ICD-10-CM | POA: Diagnosis not present

## 2019-03-19 DIAGNOSIS — F321 Major depressive disorder, single episode, moderate: Secondary | ICD-10-CM | POA: Diagnosis not present

## 2019-03-19 DIAGNOSIS — E1169 Type 2 diabetes mellitus with other specified complication: Secondary | ICD-10-CM | POA: Diagnosis not present

## 2019-03-19 DIAGNOSIS — I10 Essential (primary) hypertension: Secondary | ICD-10-CM | POA: Diagnosis not present

## 2019-03-19 DIAGNOSIS — R300 Dysuria: Secondary | ICD-10-CM | POA: Diagnosis not present

## 2019-03-19 DIAGNOSIS — E785 Hyperlipidemia, unspecified: Secondary | ICD-10-CM | POA: Diagnosis not present

## 2019-03-19 DIAGNOSIS — R3989 Other symptoms and signs involving the genitourinary system: Secondary | ICD-10-CM | POA: Diagnosis not present

## 2019-03-19 DIAGNOSIS — E440 Moderate protein-calorie malnutrition: Secondary | ICD-10-CM | POA: Diagnosis not present

## 2019-03-19 DIAGNOSIS — Z681 Body mass index (BMI) 19 or less, adult: Secondary | ICD-10-CM | POA: Diagnosis not present

## 2019-03-21 DIAGNOSIS — D171 Benign lipomatous neoplasm of skin and subcutaneous tissue of trunk: Secondary | ICD-10-CM | POA: Diagnosis not present

## 2019-03-27 ENCOUNTER — Other Ambulatory Visit: Payer: Self-pay

## 2019-03-27 ENCOUNTER — Encounter (HOSPITAL_BASED_OUTPATIENT_CLINIC_OR_DEPARTMENT_OTHER): Payer: Self-pay | Admitting: *Deleted

## 2019-03-28 ENCOUNTER — Other Ambulatory Visit: Payer: Self-pay | Admitting: Surgery

## 2019-03-29 ENCOUNTER — Other Ambulatory Visit: Payer: Self-pay

## 2019-03-29 ENCOUNTER — Encounter (HOSPITAL_BASED_OUTPATIENT_CLINIC_OR_DEPARTMENT_OTHER)
Admission: RE | Admit: 2019-03-29 | Discharge: 2019-03-29 | Disposition: A | Discharge status: Home / Self Care | Payer: Medicare HMO | Source: Ambulatory Visit | Attending: Surgery | Admitting: Surgery

## 2019-03-29 ENCOUNTER — Other Ambulatory Visit (HOSPITAL_COMMUNITY)
Admission: RE | Admit: 2019-03-29 | Discharge: 2019-03-29 | Disposition: A | Discharge status: Home / Self Care | Payer: Medicare HMO | Source: Ambulatory Visit | Attending: Surgery | Admitting: Surgery

## 2019-03-29 DIAGNOSIS — Z01812 Encounter for preprocedural laboratory examination: Secondary | ICD-10-CM | POA: Diagnosis not present

## 2019-03-29 DIAGNOSIS — Z20828 Contact with and (suspected) exposure to other viral communicable diseases: Secondary | ICD-10-CM | POA: Insufficient documentation

## 2019-03-29 DIAGNOSIS — R222 Localized swelling, mass and lump, trunk: Secondary | ICD-10-CM | POA: Diagnosis not present

## 2019-03-29 LAB — BASIC METABOLIC PANEL
Anion gap: 11 (ref 5–15)
BUN: 9 mg/dL (ref 8–23)
CO2: 22 mmol/L (ref 22–32)
Calcium: 9.4 mg/dL (ref 8.9–10.3)
Chloride: 105 mmol/L (ref 98–111)
Creatinine, Ser: 0.91 mg/dL (ref 0.44–1.00)
GFR calc Af Amer: >60 mL/min (ref >60)
GFR calc non Af Amer: 58 mL/min — ABNORMAL LOW (ref >60)
Glucose, Bld: 124 mg/dL — ABNORMAL HIGH (ref 70–99)
Potassium: 4.8 mmol/L (ref 3.5–5.1)
Sodium: 138 mmol/L (ref 135–145)

## 2019-03-29 NOTE — Progress Notes (Signed)

## 2019-03-30 LAB — NOVEL CORONAVIRUS, NAA (HOSP ORDER, SEND-OUT TO REF LAB; TAT 18-24 HRS): SARS-CoV-2, NAA: NOT DETECTED

## 2019-04-01 NOTE — H&P (Signed)
Hannah [name] Documented: 03/21/2019 2:35 PM Location: [facility] Patient #: [id number] DOB: January 08, 1933 Unknown / Language: Hannah [name] / Race: Undefined Female   History of Present Illness Hannah Key B. Hassell Done MD; 03/21/2019 2:49 PM) The patient is a 85 year old female who presents with a complaint of Mass. This is a recurrent mass on the left back near the scapula. She had it removed in [place] about 18 years ago and it has come back and is painful.  It is rubbery and doesn't feel fixed. I discussed removal with her and her daughter at [facility] Day surgery in lateral position under MAC.   Past Surgical History Hannah [name], CMA; 03/21/2019 2:35 PM) Oral Surgery  Spinal Surgery - Lower Back   Diagnostic Studies History Hannah [name], CMA; 03/21/2019 2:35 PM) Colonoscopy  >10 years ago Mammogram  >3 years ago Pap Smear  >5 years ago  Allergies Hannah [name], CMA; 03/21/2019 2:37 PM) Sulfa Antibiotics  Penicillins   Medication History (Hannah [name], CMA; 03/21/2019 2:38 PM) Albuterol Sulfate HFA (108 (90 Base)MCG/ACT Aerosol Soln, Inhalation) Active. Betamethasone Dipropionate Aug (0.05% Cream, External) Active. Diclofenac Sodium (1% Gel, Transdermal) Active. Donepezil HCl (5MG Tablet, Oral) Active. Ezetimibe-Simvastatin (10-20MG Tablet, Oral) Active. Januvia (25MG Tablet, Oral) Active. Levothyroxine Sodium (50MCG Tablet, Oral) Active. Sertraline HCl (100MG Tablet, Oral) Active. Medications Reconciled  Social History Hannah [name], CMA; 03/21/2019 2:35 PM) Caffeine use  Carbonated beverages, Tea. No alcohol use  No drug use  Tobacco use  Never smoker.  Family History Hannah [name], CMA; 03/21/2019 2:35 PM) Diabetes Mellitus  Brother, Daughter, Sister. Hypertension  Brother, Daughter, Sister.  Pregnancy / Birth History Hannah [name], CMA; 03/21/2019 2:35 PM) Age at menarche  79 years. Age of menopause  94-55 Gravida   40 Maternal age  19-20 Para  73  Other Problems Hannah [name], CMA; 03/21/2019 2:35 PM) Cancer  Depression  Diabetes Mellitus  High blood pressure  Hypercholesterolemia  Thyroid Disease     Review of Systems (Hannah [name] CMA; 03/21/2019 2:35 PM) General Present- Appetite Loss, Fatigue and Weight Loss. Not Present- Chills, Fever, Night Sweats and Weight Gain. Skin Present- Dryness. Not Present- Change in Wart/Mole, Hives, Jaundice, New Lesions, Non-Healing Wounds, Rash and Ulcer. HEENT Present- Hearing Loss and Wears glasses/contact lenses. Not Present- Earache, Hoarseness, Nose Bleed, Oral Ulcers, Ringing in the Ears, Seasonal Allergies, Sinus Pain, Sore Throat, Visual Disturbances and Yellow Eyes. Respiratory Present- Difficulty Breathing and Wheezing. Not Present- Bloody sputum, Chronic Cough and Snoring. Breast Not Present- Breast Mass, Breast Pain, Nipple Discharge and Skin Changes. Cardiovascular Present- Shortness of Breath. Not Present- Chest Pain, Difficulty Breathing Lying Down, Leg Cramps, Palpitations, Rapid Heart Rate and Swelling of Extremities. Gastrointestinal Not Present- Abdominal Pain, Bloating, Bloody Stool, Change in Bowel Habits, Chronic diarrhea, Constipation, Difficulty Swallowing, Excessive gas, Gets full quickly at meals, Hemorrhoids, Indigestion, Nausea, Rectal Pain and Vomiting. Female Genitourinary Present- Painful Urination. Not Present- Frequency, Nocturia, Pelvic Pain and Urgency. Musculoskeletal Present- Back Pain, Joint Pain, Joint Stiffness, Muscle Pain and Muscle Weakness. Not Present- Swelling of Extremities. Neurological Present- Decreased Memory, Headaches and Weakness. Not Present- Fainting, Numbness, Seizures, Tingling, Tremor and Trouble walking. Psychiatric Present- Depression. Not Present- Anxiety, Bipolar, Change in Sleep Pattern, Fearful and Frequent crying.  Vitals (Hannah [name] CMA; 03/21/2019 2:36 PM) 03/21/2019 2:36  PM Weight: 97.4 lb Height: 62in Body Surface Area: 1.41 m Body Mass Index: 17.81 kg/m  Pulse: 91 (Regular)        Physical Exam (Hannah [name] B. Hassell Done MD; 03/21/2019 2:51 PM) General  Note: Elderly [place] lady HEENT with oral cancer treated Neck no bruits Heart SR Back      Assessment & Plan Hannah Key B. Hassell Done MD; 03/21/2019 2:52 PM) LIPOMA OF BACK (D17.1) Impression: will excise under local/Mac at [facility]

## 2019-04-01 NOTE — Anesthesia Preprocedure Evaluation (Addendum)
Anesthesia Evaluation  Patient identified by MRN, date of birth, ID band Patient awake    Reviewed: Allergy & Precautions, NPO status , Patient's Chart, lab work & pertinent test results  Airway Mallampati: I  TM Distance: >3 FB Neck ROM: Full    Dental no notable dental hx. (+) Edentulous Upper, Edentulous Lower   Pulmonary neg pulmonary ROS,    Pulmonary exam normal breath sounds clear to auscultation       Cardiovascular hypertension, Normal cardiovascular exam Rhythm:Regular Rate:Normal     Neuro/Psych    GI/Hepatic   Endo/Other  diabetes  Renal/GU      Musculoskeletal negative musculoskeletal ROS (+)   Abdominal   Peds  Hematology   Anesthesia Other Findings   Reproductive/Obstetrics                            Anesthesia Physical Anesthesia Plan  ASA: III  Anesthesia Plan: MAC   Post-op Pain Management:    Induction: Intravenous  PONV Risk Score and Plan: 3 and Treatment may vary due to age or medical condition and Ondansetron  Airway Management Planned:   Additional Equipment: None  Intra-op Plan:   Post-operative Plan:   Informed Consent: I have reviewed the patients History and Physical, chart, labs and discussed the procedure including the risks, benefits and alternatives for the proposed anesthesia with the patient or authorized representative who has indicated his/her understanding and acceptance.     Dental advisory given  Plan Discussed with: CRNA  Anesthesia Plan Comments:        Anesthesia Quick Evaluation

## 2019-04-02 ENCOUNTER — Encounter (HOSPITAL_BASED_OUTPATIENT_CLINIC_OR_DEPARTMENT_OTHER)
Admission: RE | Disposition: A | Discharge status: Home / Self Care | Payer: Self-pay | Source: Home / Self Care | Attending: Surgery

## 2019-04-02 ENCOUNTER — Ambulatory Visit (HOSPITAL_BASED_OUTPATIENT_CLINIC_OR_DEPARTMENT_OTHER): Payer: Medicare HMO | Admitting: Anesthesiology

## 2019-04-02 ENCOUNTER — Ambulatory Visit (HOSPITAL_BASED_OUTPATIENT_CLINIC_OR_DEPARTMENT_OTHER)
Admission: RE | Admit: 2019-04-02 | Discharge: 2019-04-02 | Disposition: A | Discharge status: Home / Self Care | Payer: Medicare HMO | Attending: Surgery | Admitting: Surgery

## 2019-04-02 ENCOUNTER — Other Ambulatory Visit: Payer: Self-pay

## 2019-04-02 ENCOUNTER — Encounter (HOSPITAL_BASED_OUTPATIENT_CLINIC_OR_DEPARTMENT_OTHER): Payer: Self-pay

## 2019-04-02 DIAGNOSIS — E7800 Pure hypercholesterolemia, unspecified: Secondary | ICD-10-CM | POA: Insufficient documentation

## 2019-04-02 DIAGNOSIS — D171 Benign lipomatous neoplasm of skin and subcutaneous tissue of trunk: Secondary | ICD-10-CM | POA: Insufficient documentation

## 2019-04-02 DIAGNOSIS — I10 Essential (primary) hypertension: Secondary | ICD-10-CM | POA: Insufficient documentation

## 2019-04-02 DIAGNOSIS — Z85819 Personal history of malignant neoplasm of unspecified site of lip, oral cavity, and pharynx: Secondary | ICD-10-CM | POA: Insufficient documentation

## 2019-04-02 DIAGNOSIS — Z79899 Other long term (current) drug therapy: Secondary | ICD-10-CM | POA: Insufficient documentation

## 2019-04-02 DIAGNOSIS — Z7984 Long term (current) use of oral hypoglycemic drugs: Secondary | ICD-10-CM | POA: Diagnosis not present

## 2019-04-02 DIAGNOSIS — E119 Type 2 diabetes mellitus without complications: Secondary | ICD-10-CM | POA: Diagnosis not present

## 2019-04-02 DIAGNOSIS — R222 Localized swelling, mass and lump, trunk: Secondary | ICD-10-CM | POA: Diagnosis present

## 2019-04-02 DIAGNOSIS — F329 Major depressive disorder, single episode, unspecified: Secondary | ICD-10-CM | POA: Insufficient documentation

## 2019-04-02 DIAGNOSIS — Z79890 Hormone replacement therapy (postmenopausal): Secondary | ICD-10-CM | POA: Diagnosis not present

## 2019-04-02 DIAGNOSIS — E039 Hypothyroidism, unspecified: Secondary | ICD-10-CM | POA: Diagnosis not present

## 2019-04-02 HISTORY — DX: Abnormal weight loss: R63.4

## 2019-04-02 HISTORY — PX: MASS EXCISION: SHX2000

## 2019-04-02 LAB — GLUCOSE, CAPILLARY
Glucose-Capillary: 94 mg/dL (ref 70–99)
Glucose-Capillary: 116 mg/dL — ABNORMAL HIGH (ref 70–99)

## 2019-04-02 SURGERY — EXCISION MASS
Anesthesia: Monitor Anesthesia Care | Site: Back | Laterality: Left

## 2019-04-02 MED ORDER — CHLORHEXIDINE GLUCONATE CLOTH 2 % EX PADS: 6.0000 | MEDICATED_PAD | Freq: Once | CUTANEOUS | Status: DC

## 2019-04-02 MED ORDER — MIDAZOLAM HCL 2 MG/2ML IJ SOLN: 1.0000 mg | INTRAMUSCULAR | Status: DC | PRN

## 2019-04-02 MED ORDER — FENTANYL CITRATE (PF) 100 MCG/2ML IJ SOLN
INTRAMUSCULAR | Status: AC
  Filled 2019-04-02: qty 2

## 2019-04-02 MED ORDER — ACETAMINOPHEN 500 MG PO TABS
ORAL_TABLET | ORAL | Status: AC
  Filled 2019-04-02: qty 2

## 2019-04-02 MED ORDER — ACETAMINOPHEN 10 MG/ML IV SOLN: 1000.0000 mg | Freq: Once | INTRAVENOUS | Status: DC | PRN

## 2019-04-02 MED ORDER — FENTANYL CITRATE (PF) 100 MCG/2ML IJ SOLN
50.0000 ug | INTRAMUSCULAR | Status: DC | PRN
  Administered 2019-04-02 (×2): 50 ug via INTRAVENOUS

## 2019-04-02 MED ORDER — ONDANSETRON HCL 4 MG/2ML IJ SOLN
INTRAMUSCULAR | Status: DC | PRN
  Administered 2019-04-02: 4 mg via INTRAVENOUS

## 2019-04-02 MED ORDER — PROPOFOL 500 MG/50ML IV EMUL
INTRAVENOUS | Status: DC | PRN
  Administered 2019-04-02: 200 ug/kg/min via INTRAVENOUS

## 2019-04-02 MED ORDER — HYDROCODONE-ACETAMINOPHEN 5-325 MG PO TABS: 1.0000 | ORAL_TABLET | Freq: Four times a day (QID) | ORAL | 0 refills | Status: DC | PRN

## 2019-04-02 MED ORDER — CEFAZOLIN SODIUM-DEXTROSE 2-3 GM-%(50ML) IV SOLR
INTRAVENOUS | Status: DC | PRN
  Administered 2019-04-02: 2 g via INTRAVENOUS

## 2019-04-02 MED ORDER — FENTANYL CITRATE (PF) 100 MCG/2ML IJ SOLN: 25.0000 ug | INTRAMUSCULAR | Status: DC | PRN

## 2019-04-02 MED ORDER — ONDANSETRON HCL 4 MG/2ML IJ SOLN: 4.0000 mg | Freq: Once | INTRAMUSCULAR | Status: DC | PRN

## 2019-04-02 MED ORDER — ONDANSETRON HCL 4 MG/2ML IJ SOLN
INTRAMUSCULAR | Status: AC
  Filled 2019-04-02: qty 2

## 2019-04-02 MED ORDER — EPHEDRINE SULFATE 50 MG/ML IJ SOLN
INTRAMUSCULAR | Status: DC | PRN
  Administered 2019-04-02: 15 mg via INTRAVENOUS

## 2019-04-02 MED ORDER — ACETAMINOPHEN 500 MG PO TABS
1000.0000 mg | ORAL_TABLET | ORAL | Status: AC
  Administered 2019-04-02: 14:00:00 1000 mg via ORAL

## 2019-04-02 MED ORDER — LACTATED RINGERS IV SOLN
INTRAVENOUS | Status: DC
  Administered 2019-04-02: 15:00:00 via INTRAVENOUS

## 2019-04-02 MED ORDER — BUPIVACAINE HCL (PF) 0.5 % IJ SOLN
INTRAMUSCULAR | Status: DC | PRN
  Administered 2019-04-02: 20 mL

## 2019-04-02 SURGICAL SUPPLY — 49 items
BENZOIN TINCTURE PRP APPL 2/3 (GAUZE/BANDAGES/DRESSINGS) IMPLANT
BLADE CLIPPER SURG (BLADE) IMPLANT
BLADE SURG 15 STRL LF DISP TIS (BLADE) ×1 IMPLANT
BLADE SURG 15 STRL SS (BLADE) ×1
CANISTER SUCT 1200ML W/VALVE (MISCELLANEOUS) IMPLANT
CLEANER CAUTERY TIP 5X5 PAD (MISCELLANEOUS) ×1 IMPLANT
COVER BACK TABLE REUSABLE LG (DRAPES) ×2 IMPLANT
COVER MAYO STAND REUSABLE (DRAPES) ×2 IMPLANT
COVER WAND RF STERILE (DRAPES) IMPLANT
DECANTER SPIKE VIAL GLASS SM (MISCELLANEOUS) IMPLANT
DERMABOND ADVANCED (GAUZE/BANDAGES/DRESSINGS)
DERMABOND ADVANCED .7 DNX12 (GAUZE/BANDAGES/DRESSINGS) IMPLANT
DRAPE LAPAROTOMY 100X72 PEDS (DRAPES) IMPLANT
DRAPE U-SHAPE 76X120 STRL (DRAPES) IMPLANT
DRSG TEGADERM 2-3/8X2-3/4 SM (GAUZE/BANDAGES/DRESSINGS) IMPLANT
ELECT REM PT RETURN 9FT ADLT (ELECTROSURGICAL) ×2
ELECTRODE REM PT RTRN 9FT ADLT (ELECTROSURGICAL) ×1 IMPLANT
GAUZE SPONGE 4X4 12PLY STRL LF (GAUZE/BANDAGES/DRESSINGS) ×2 IMPLANT
GLOVE BIO SURGEON STRL SZ8 (GLOVE) ×2 IMPLANT
GLOVE BIOGEL M STRL SZ7.5 (GLOVE) ×2 IMPLANT
GLOVE BIOGEL PI IND STRL 8 (GLOVE) ×1 IMPLANT
GLOVE BIOGEL PI INDICATOR 8 (GLOVE) ×1
GOWN STRL REUS W/ TWL LRG LVL3 (GOWN DISPOSABLE) ×1 IMPLANT
GOWN STRL REUS W/ TWL XL LVL3 (GOWN DISPOSABLE) ×1 IMPLANT
GOWN STRL REUS W/TWL LRG LVL3 (GOWN DISPOSABLE) ×1
GOWN STRL REUS W/TWL XL LVL3 (GOWN DISPOSABLE) ×1
NEEDLE HYPO 25X1 1.5 SAFETY (NEEDLE) ×2 IMPLANT
NEEDLE PRECISIONGLIDE 27X1.5 (NEEDLE) IMPLANT
NS IRRIG 1000ML POUR BTL (IV SOLUTION) IMPLANT
PACK BASIN DAY SURGERY FS (CUSTOM PROCEDURE TRAY) ×2 IMPLANT
PAD CLEANER CAUTERY TIP 5X5 (MISCELLANEOUS) ×1
PENCIL SMOKE EVACUATOR (MISCELLANEOUS) ×2 IMPLANT
SPONGE GAUZE 2X2 8PLY STRL LF (GAUZE/BANDAGES/DRESSINGS) ×4 IMPLANT
STRIP CLOSURE SKIN 1/2X4 (GAUZE/BANDAGES/DRESSINGS) IMPLANT
SUT ETHILON 3 0 FSL (SUTURE) IMPLANT
SUT ETHILON 5 0 PS 2 18 (SUTURE) IMPLANT
SUT MNCRL AB 4-0 PS2 18 (SUTURE) IMPLANT
SUT VIC AB 3-0 SH 27 (SUTURE)
SUT VIC AB 3-0 SH 27X BRD (SUTURE) IMPLANT
SUT VIC AB 4-0 SH 18 (SUTURE) ×2 IMPLANT
SUT VIC AB 5-0 PS2 18 (SUTURE) IMPLANT
SUT VICRYL 3-0 CR8 SH (SUTURE) ×2 IMPLANT
SYR BULB 3OZ (MISCELLANEOUS) ×2 IMPLANT
SYR CONTROL 10ML LL (SYRINGE) ×2 IMPLANT
TOWEL GREEN STERILE FF (TOWEL DISPOSABLE) ×2 IMPLANT
TRAY DSU PREP LF (CUSTOM PROCEDURE TRAY) ×2 IMPLANT
TUBE CONNECTING 20X1/4 (TUBING) ×2 IMPLANT
UNDERPAD 30X36 HEAVY ABSORB (UNDERPADS AND DIAPERS) IMPLANT
YANKAUER SUCT BULB TIP NO VENT (SUCTIONS) IMPLANT

## 2019-04-02 NOTE — Transfer of Care (Signed)
Immediate Anesthesia Transfer of Care Note  Patient: Hannah [name]  Procedure(s) Performed: EXCISION OF LIPOMATOUS MASS OF THE LEFT BACK (Left Back)  Patient Location: PACU  Anesthesia Type:MAC  Level of Consciousness: awake, alert  and oriented  Airway & Oxygen Therapy: Patient Spontanous Breathing and Patient connected to face mask oxygen  Post-op Assessment: Report given to RN and Post -op Vital signs reviewed and stable  Post vital signs: Reviewed and stable  Last Vitals:  Vitals Value Taken Time  BP    Temp    Pulse 87 04/02/19 1614  Resp    SpO2 98 % 04/02/19 1614  Vitals shown include unvalidated device data.  Last Pain:  Vitals:   04/02/19 1423  TempSrc: Oral  PainSc: 7       Patients Stated Pain Goal: 5 (67/34/19 3790)  Complications: No apparent anesthesia complications

## 2019-04-02 NOTE — Discharge Instructions (Signed)
No Tylenol until 9:30pm if needed  Lipoma  A lipoma is a noncancerous (benign) tumor that is made up of fat cells. This is a very common type of soft-tissue growth. Lipomas are usually found under the skin (subcutaneous). They may occur in any tissue of the body that contains fat. Common areas for lipomas to appear include the back, shoulders, buttocks, and thighs.  Lipomas grow slowly, and they are usually painless. Most lipomas do not cause problems and do not require treatment. What are the causes? The cause of this condition is not known. What increases the risk? You are more likely to develop this condition if:  You are 6-24 years old.  You have a family history of lipomas. What are the signs or symptoms? A lipoma usually appears as a small, round bump under the skin. In most cases, the lump will:  Feel soft or rubbery.  Not cause pain or other symptoms. However, if a lipoma is located in an area where it pushes on nerves, it can become painful or cause other symptoms. How is this diagnosed? A lipoma can usually be diagnosed with a physical exam. You may also have tests to confirm the diagnosis and to rule out other conditions. Tests may include:  Imaging tests, such as a CT scan or MRI.  Removal of a tissue sample to be looked at under a microscope (biopsy). How is this treated? Treatment for this condition depends on the size of the lipoma and whether it is causing any symptoms.  For small lipomas that are not causing problems, no treatment is needed.  If a lipoma is bigger or it causes problems, surgery may be done to remove the lipoma. Lipomas can also be removed to improve appearance. Most often, the procedure is done after applying a medicine that numbs the area (local anesthetic). Follow these instructions at home:  Watch your lipoma for any changes.  Keep all follow-up visits as told by your health care provider. This is important. Contact a health care provider  if:  Your lipoma becomes larger or hard.  Your lipoma becomes painful, red, or increasingly swollen. These could be signs of infection or a more serious condition. Get help right away if:  You develop tingling or numbness in an area near the lipoma. This could indicate that the lipoma is causing nerve damage. Summary  A lipoma is a noncancerous tumor that is made up of fat cells.  Most lipomas do not cause problems and do not require treatment.  If a lipoma is bigger or it causes problems, surgery may be done to remove the lipoma. This information is not intended to replace advice given to you by your health care provider. Make sure you discuss any questions you have with your health care provider. Document Released: 04/23/2002 Document Revised: 04/19/2017 Document Reviewed: 04/19/2017 Elsevier Patient Education  [place] Instructions  Activity: Get plenty of rest for the remainder of the day. A responsible individual must stay with you for 24 hours following the procedure.  For the next 24 hours, DO NOT: -Drive a car -Paediatric nurse -Drink alcoholic beverages -Take any medication unless instructed by your physician -Make any legal decisions or sign important papers.  Meals: Start with liquid foods such as gelatin or soup. Progress to regular foods as tolerated. Avoid greasy, spicy, heavy foods. If nausea and/or vomiting occur, drink only clear liquids until the nausea and/or vomiting subsides. Call your physician if vomiting continues.  Special Instructions/Symptoms: Your throat may feel dry or sore from the anesthesia or the breathing tube placed in your throat during surgery. If this causes discomfort, gargle with warm salt water. The discomfort should disappear within 24 hours.  If you had a scopolamine patch placed behind your ear for the management of post- operative nausea and/or vomiting:  1. The medication in the patch is  effective for 72 hours, after which it should be removed.  Wrap patch in a tissue and discard in the trash. Wash hands thoroughly with soap and water. 2. You may remove the patch earlier than 72 hours if you experience unpleasant side effects which may include dry mouth, dizziness or visual disturbances. 3. Avoid touching the patch. Wash your hands with soap and water after contact with the patch.

## 2019-04-02 NOTE — Op Note (Signed)
LARENDA REEDY  [birth year] 02 April 2019    PCP:  Donald Prose, MD   Surgeon: Kaylyn Lim, MD, FACS  Asst:  none  Anes:  Local !% plain lidocaine and MAC  Preop Dx: Recurrent lipoma  Postop Dx: 8 cm lipoma of the left back  Procedure: Excision of 8 cm lipoma of the back (recurrent) Location Surgery: CDS 8 Complications: none  EBL:   5 cc  Drains: none  Description of Procedure:  The patient was taken to OR 8 .  After anesthesia was administered and the patient was prepped  with [employer]icare and a timeout was performed.  Area infiltrated with lidocaine and transverse incision made over the mass.  There was scar but I was able to get into place and remove the bulk of the mass (8 cm) en toto and a satellite mass was removed separately.  Hemostasis was present from the Bovie.  The wound was closed with 4-0 vicryl and then 5-0 nylon in a running fashion.    The patient tolerated the procedure well and was taken to the PACU in stable condition.     Matt B. Hassell Done, [place], [facility], [place]

## 2019-04-02 NOTE — Anesthesia Postprocedure Evaluation (Signed)
Anesthesia Post Note  Patient: Hannah [name]  Procedure(s) Performed: EXCISION OF LIPOMATOUS MASS OF THE LEFT BACK (Left Back)     Patient location during evaluation: PACU Anesthesia Type: MAC Level of consciousness: awake and alert Pain management: pain level controlled Vital Signs Assessment: post-procedure vital signs reviewed and stable Respiratory status: spontaneous breathing, nonlabored ventilation, respiratory function stable and patient connected to nasal cannula oxygen Cardiovascular status: stable and blood pressure returned to baseline Postop Assessment: no apparent nausea or vomiting Anesthetic complications: no    Last Vitals:  Vitals:   04/02/19 1614 04/02/19 1615  BP:  (!) 170/73  Pulse: 87 80  Resp:  (!) 23  Temp:  36.8 C  SpO2: 98% 98%    Last Pain:  Vitals:   04/02/19 1615  TempSrc:   PainSc: 0-No pain                 Barnet Glasgow

## 2019-04-02 NOTE — Interval H&P Note (Signed)
History and Physical Interval Note:  04/02/2019 3:19 PM  Hannah [name]  has presented today for surgery, with the diagnosis of RECURRENT MASS ON BACK.  The various methods of treatment have been discussed with the patient and family. After consideration of risks, benefits and other options for treatment, the patient has consented to  Procedure(s): [place] (Left) as a surgical intervention.  The patient's history has been reviewed, patient examined, no change in status, stable for surgery.  I have reviewed the patient's chart and labs.  Questions were answered to the patient's satisfaction.     Pedro Earls

## 2019-04-03 ENCOUNTER — Encounter (HOSPITAL_BASED_OUTPATIENT_CLINIC_OR_DEPARTMENT_OTHER): Payer: Self-pay | Admitting: Surgery

## 2019-04-03 LAB — SURGICAL PATHOLOGY

## 2019-04-16 ENCOUNTER — Other Ambulatory Visit (HOSPITAL_COMMUNITY)
Admission: RE | Admit: 2019-04-16 | Discharge: 2019-04-16 | Disposition: A | Discharge status: Home / Self Care | Payer: Medicare HMO | Source: Ambulatory Visit | Attending: Pulmonary Disease | Admitting: Pulmonary Disease

## 2019-04-16 DIAGNOSIS — Z01812 Encounter for preprocedural laboratory examination: Secondary | ICD-10-CM | POA: Diagnosis not present

## 2019-04-16 DIAGNOSIS — Z20828 Contact with and (suspected) exposure to other viral communicable diseases: Secondary | ICD-10-CM | POA: Insufficient documentation

## 2019-04-17 LAB — NOVEL CORONAVIRUS, NAA (HOSP ORDER, SEND-OUT TO REF LAB; TAT 18-24 HRS): SARS-CoV-2, NAA: NOT DETECTED

## 2019-04-18 DIAGNOSIS — F321 Major depressive disorder, single episode, moderate: Secondary | ICD-10-CM | POA: Diagnosis not present

## 2019-04-18 DIAGNOSIS — M5416 Radiculopathy, lumbar region: Secondary | ICD-10-CM | POA: Diagnosis not present

## 2019-04-18 NOTE — Progress Notes (Signed)
@Patient  ID: Hannah [name], female    DOB: April 03, 1933, 85 y.o.   MRN: [id number]  Chief Complaint  Patient presents with  . Follow-up    F/U after PFT. States her breathing has improved since last visit with Symbicort. Per daughter, she is still having on average 2-3 episodes of SOB a day.     Referring provider: Donald Prose, MD  HPI:  85 year old never smoker female initially referred to our office on 02/20/2019 for evaluation of dyspnea and shortness of breath  PMH: Hayfever, history of tuberculosis in 1978, nonrheumatic mitral valve regurgitation, oral cancer 2012, radiation for oral cancer in 2013 Smoker/ Smoking History: Never smoker Maintenance:   Pt of: Dr. Valeta Harms  04/19/2019  - Visit   85 year old female never smoker initially referred to our office on 02/20/2019 for evaluation of dyspnea.  Patient presents today after completing pulmonary function testing.  At last office visit it was revealed the patient was born in [place] and later moved to [place] and moved to the [place] in 1983.  She has a past medical history significant for tuberculosis in 1978.  She has occasional bouts of fatigue, hayfever.  Never has had a breathing test or has been diagnosed with asthma.  Patient is a never smoker.  No significant dust or mold exposure in the past.  At last office visit on 02/20/2019 pulmonary function test was ordered as well as an empiric trial of Symbicort.  Could consider axial CT imaging to evaluate for bronchiectasis based off of patient's history of tuberculosis.  Patient presenting today with her daughter after completing pulmonary function testing.  Pulmonary function test results are listed below:  04/19/2019-pulmonary function test-FVC 1.53 (69% predicted), postbronchodilator ratio 69, postbronchodilator FEV1 1.08 (66% predicted), no bronchodilator response, mid flow reversibility, DLCO 9.57 (55% predicted) >>>Of note patient did use her Symbicort this morning  Patient  did have oral cancer in December/2012 she received radiation in 2013.  She did have a history of tuberculosis in 1978.  She does not have a baseline cough.  She does not produce mucus.  She does not cough in the morning.  They do feel that the Symbicort has helped with her wheezing as well as shortness of breath but it continues to persist despite inhaler use.  She does not have atopic symptoms such as hayfever, rhinitis, eczema.  Tests:   04/19/2019-pulmonary function test-FVC 1.53 (69% predicted), postbronchodilator ratio 69, postbronchodilator FEV1 1.08 (66% predicted), no bronchodilator response, mid flow reversibility, DLCO 9.57 (55% predicted) >>>Of note patient did use her Symbicort this morning  FENO:  No results found for: NITRICOXIDE  PFT: PFT Results Latest Ref Rng & Units 04/19/2019  FVC-Pre L 1.53  FVC-Predicted Pre % 69  FVC-Post L 1.57  FVC-Predicted Post % 71  Pre FEV1/FVC % % 66  Post FEV1/FCV % % 69  FEV1-Pre L 1.01  FEV1-Predicted Pre % 62  FEV1-Post L 1.08  DLCO UNC% % 55  DLCO COR %Predicted % 86  TLC L 3.87  TLC % Predicted % 81  RV % Predicted % 101    WALK:  SIX MIN WALK 04/19/2019  Supplimental Oxygen during Test? (L/min) No  Tech Comments: Patient was able to complete 2 laps at a steady pace. Denied any SOB or CP during or after walking. No O2 needed during or after walk.    Imaging: No results found.  Lab Results:  CBC    Component Value Date/Time   WBC 10.3 11/15/2018 0945  WBC 9.9 04/06/2018 1427   RBC 4.46 11/15/2018 0945   RBC 4.43 04/06/2018 1427   HGB 12.0 11/15/2018 0945   HCT 37.5 11/15/2018 0945   PLT 247 11/15/2018 0945   MCV 84 11/15/2018 0945   MCH 26.9 11/15/2018 0945   MCH 26.2 04/06/2018 1427   MCHC 32.0 11/15/2018 0945   MCHC 30.1 04/06/2018 1427   RDW 13.9 11/15/2018 0945   LYMPHSABS 2.8 04/06/2018 1427   MONOABS 0.9 04/06/2018 1427   EOSABS 1.0 (H) 04/06/2018 1427   BASOSABS 0.1 04/06/2018 1427    BMET     Component Value Date/Time   NA 138 03/29/2019 0933   NA 142 11/15/2018 0945   K 4.8 03/29/2019 0933   CL 105 03/29/2019 0933   CO2 22 03/29/2019 0933   GLUCOSE 124 (H) 03/29/2019 0933   BUN 9 03/29/2019 0933   BUN 17 11/15/2018 0945   CREATININE 0.91 03/29/2019 0933   CALCIUM 9.4 03/29/2019 0933   GFRNONAA 58 (L) 03/29/2019 0933   GFRAA >60 03/29/2019 0933    BNP No results found for: BNP  ProBNP No results found for: PROBNP  Specialty Problems      Pulmonary Problems   Dyspnea on exertion   Chronic obstructive asthma (HCC)   Decreased diffusion capacity   Wheezing      Allergies  Allergen Reactions  . Penicillins     UNSPECIFIED REACTION   . Sulfa Antibiotics     UNSPECIFIED REACTION       There is no immunization history on file for this patient.  Past Medical History:  Diagnosis Date  . Depression   . Diabetes mellitus   . Hypercholesteremia   . Hypertension   . Hypothyroidism   . Oral cancer [facility]) 2012   surgery and radiation  . Tuberculosis    1978  . Weight loss, unintentional     Tobacco History: Social History   Tobacco Use  Smoking Status Never Smoker  Smokeless Tobacco Never Used   Counseling given: Yes   Continue to not smoke  Outpatient Encounter Medications as of 04/19/2019  Medication Sig  . acetaminophen (TYLENOL) 325 MG tablet Take 650 mg by mouth every 6 (six) hours as needed.  Marland Kitchen albuterol (VENTOLIN HFA) 108 (90 Base) MCG/ACT inhaler Inhale 2 puffs into the lungs every 6 (six) hours as needed for wheezing or shortness of breath.  Marland Kitchen augmented betamethasone dipropionate (DIPROLENE-AF) 0.05 % cream Apply 1 application topically daily.  . budesonide-formoterol (SYMBICORT) 80-4.5 MCG/ACT inhaler Inhale 2 puffs into the lungs 2 (two) times daily.  . cyanocobalamin 1000 MCG tablet Take 1,000 mcg by mouth daily.  . diclofenac sodium (VOLTAREN) 1 % GEL Apply 2 g topically 3 (three) times daily as needed (joint pain).  Marland Kitchen donepezil  (ARICEPT) 5 MG tablet Take 1 tablet by mouth daily.  Marland Kitchen ezetimibe-simvastatin (VYTORIN) 10-20 MG per tablet Take 1 tablet by mouth at bedtime.  . Fluticasone Propionate 0.05 % LOTN Apply 1 application topically as needed.   Marland Kitchen HYDROcodone-acetaminophen (NORCO/VICODIN) 5-325 MG tablet Take 1 tablet by mouth every 6 (six) hours as needed for moderate pain.  . hydrocortisone cream 1 % Apply 1 application topically as needed (rash).  Marland Kitchen levothyroxine (SYNTHROID, LEVOTHROID) 50 MCG tablet Take 25-50 mcg by mouth See admin instructions. Take 1/2 tablet (12mcg) alternating with 1 tablet (82mcg) every other day.  . lidocaine (XYLOCAINE) 2 % solution Use as directed 3 mLs in the mouth or throat as needed.  . megestrol (  MEGACE) 400 MG/10ML suspension Take by mouth daily.  . sertraline (ZOLOFT) 100 MG tablet Take 50 mg by mouth daily. Take 1/2 tablet daily  . sitaGLIPtin (JANUVIA) 25 MG tablet Take 25 mg by mouth daily.   No facility-administered encounter medications on file as of 04/19/2019.      Review of Systems  Review of Systems  Constitutional: Positive for fatigue. Negative for activity change and fever.  HENT: Negative for sinus pressure, sinus pain and sore throat.   Respiratory: Positive for shortness of breath and wheezing. Negative for cough.   Cardiovascular: Negative for chest pain and palpitations.  Gastrointestinal: Negative for diarrhea, nausea and vomiting.  Musculoskeletal: Negative for arthralgias.  Neurological: Negative for dizziness.  Psychiatric/Behavioral: Negative for sleep disturbance. The patient is not nervous/anxious.      Physical Exam  BP 120/64   Pulse 84   Temp (!) 97.2 F (36.2 C) (Temporal)   Ht 5\' 2"  (1.575 m)   Wt 97 lb (44 kg)   SpO2 98%   BMI 17.74 kg/m   Wt Readings from Last 5 Encounters:  04/19/19 97 lb (44 kg)  04/02/19 97 lb (44 kg)  02/20/19 95 lb 9.6 oz (43.4 kg)  11/15/18 88 lb 11.2 oz (40.2 kg)  07/28/18 85 lb 11.2 oz (38.9 kg)    BMI  Readings from Last 5 Encounters:  04/19/19 17.74 kg/m  04/02/19 17.74 kg/m  02/20/19 17.49 kg/m  11/15/18 16.76 kg/m  07/28/18 15.67 kg/m    Physical Exam Vitals signs and nursing note reviewed.  Constitutional:      General: She is not in acute distress.    Appearance: Normal appearance. She is normal weight.  HENT:     Head: Normocephalic and atraumatic.     Right Ear: External ear normal.     Left Ear: External ear normal.     Nose: Nose normal.     Mouth/Throat:     Mouth: Mucous membranes are moist.     Pharynx: Oropharynx is clear.  Eyes:     Pupils: Pupils are equal, round, and reactive to light.  Neck:     Musculoskeletal: Normal range of motion.  Cardiovascular:     Rate and Rhythm: Normal rate and regular rhythm.     Pulses: Normal pulses.     Heart sounds: Normal heart sounds. No murmur.  Pulmonary:     Effort: Pulmonary effort is normal. No respiratory distress.     Breath sounds: Normal breath sounds. No decreased air movement. No decreased breath sounds, wheezing or rales.  Skin:    General: Skin is warm and dry.     Capillary Refill: Capillary refill takes less than 2 seconds.  Neurological:     General: No focal deficit present.     Mental Status: She is alert and oriented to person, place, and time. Mental status is at baseline.     Gait: Gait (Tolerated walk in office today) normal.  Psychiatric:        Mood and Affect: Mood normal.        Behavior: Behavior normal.        Thought Content: Thought content normal.        Judgment: Judgment normal.       Assessment & Plan:   Dyspnea on exertion Plan: Continue Symbicort 160 Order high-resolution CT chest IgE, CBC with differential ordered today Follow-up in 2 to 4 weeks  Chronic obstructive asthma (HCC) Elevated eosinophil count in 2019  Plan: Continue Symbicort  160 Lab work today: IgE, CBC with differential  Eosinophilic leukocytosis Plan: Lab work today  Wheezing Suspect this  may be chronic obstructive asthma Elevated peripheral eosinophil count in the past  Plan: Continue Symbicort 160 Lab work today  Decreased diffusion capacity Plan: Walk today High-resolution CT chest ordered    Return in about 4 weeks (around 05/17/2019), or if symptoms worsen or fail to improve, for Follow up with Dr. Valeta Harms, Follow up with Wyn Quaker FNP-C, After Chest CT.   Lauraine Rinne, NP 04/19/2019   This appointment was 42 minutes long with over 50% of the time in direct face-to-face patient care, assessment, plan of care, and follow-up.

## 2019-04-19 ENCOUNTER — Ambulatory Visit (INDEPENDENT_AMBULATORY_CARE_PROVIDER_SITE_OTHER): Payer: Medicare HMO | Admitting: Pulmonary Disease

## 2019-04-19 ENCOUNTER — Other Ambulatory Visit: Payer: Self-pay

## 2019-04-19 ENCOUNTER — Encounter: Payer: Self-pay | Admitting: Pulmonary Disease

## 2019-04-19 VITALS — BP 120/64 | HR 84 | Temp 97.2°F | Ht 62.0 in | Wt 97.0 lb

## 2019-04-19 DIAGNOSIS — J449 Chronic obstructive pulmonary disease, unspecified: Secondary | ICD-10-CM

## 2019-04-19 DIAGNOSIS — J4489 Other specified chronic obstructive pulmonary disease: Secondary | ICD-10-CM | POA: Insufficient documentation

## 2019-04-19 DIAGNOSIS — D7219 Other eosinophilia: Secondary | ICD-10-CM | POA: Insufficient documentation

## 2019-04-19 DIAGNOSIS — R942 Abnormal results of pulmonary function studies: Secondary | ICD-10-CM | POA: Diagnosis not present

## 2019-04-19 DIAGNOSIS — R0602 Shortness of breath: Secondary | ICD-10-CM | POA: Diagnosis not present

## 2019-04-19 DIAGNOSIS — R0609 Other forms of dyspnea: Secondary | ICD-10-CM

## 2019-04-19 DIAGNOSIS — R062 Wheezing: Secondary | ICD-10-CM | POA: Insufficient documentation

## 2019-04-19 DIAGNOSIS — R0600 Dyspnea, unspecified: Secondary | ICD-10-CM

## 2019-04-19 LAB — PULMONARY FUNCTION TEST
DL/VA % pred: 86 %
DL/VA: 3.55 ml/min/mmHg/L
DLCO unc % pred: 55 %
DLCO unc: 9.57 ml/min/mmHg
FEF 25-75 Post: 0.71 L/sec
FEF 25-75 Pre: 0.59 L/sec
FEF2575-%Change-Post: 20 %
FEF2575-%Pred-Post: 65 %
FEF2575-%Pred-Pre: 54 %
FEV1-%Change-Post: 6 %
FEV1-%Pred-Post: 66 %
FEV1-%Pred-Pre: 62 %
FEV1-Post: 1.08 L
FEV1-Pre: 1.01 L
FEV1FVC-%Change-Post: 3 %
FEV1FVC-%Pred-Pre: 90 %
FEV6-%Change-Post: 1 %
FEV6-%Pred-Post: 75 %
FEV6-%Pred-Pre: 74 %
FEV6-Post: 1.55 L
FEV6-Pre: 1.53 L
FEV6FVC-%Change-Post: -1 %
FEV6FVC-%Pred-Post: 105 %
FEV6FVC-%Pred-Pre: 106 %
FVC-%Change-Post: 2 %
FVC-%Pred-Post: 71 %
FVC-%Pred-Pre: 69 %
FVC-Post: 1.57 L
FVC-Pre: 1.53 L
Post FEV1/FVC ratio: 69 %
Post FEV6/FVC ratio: 99 %
Pre FEV1/FVC ratio: 66 %
Pre FEV6/FVC Ratio: 100 %
RV % pred: 101 %
RV: 2.39 L
TLC % pred: 81 %
TLC: 3.87 L

## 2019-04-19 NOTE — Progress Notes (Signed)
PCCM: Thanks for seeing her and reviewing PFTs. Agree with HRCT Lance Muss, DO [facility] 04/19/2019 4:34 PM

## 2019-04-19 NOTE — Assessment & Plan Note (Signed)
Plan: Walk today High-resolution CT chest ordered

## 2019-04-19 NOTE — Assessment & Plan Note (Signed)
Suspect this may be chronic obstructive asthma Elevated peripheral eosinophil count in the past  Plan: Continue Symbicort 160 Lab work today

## 2019-04-19 NOTE — Patient Instructions (Addendum)
You were seen today by Lauraine Rinne, NP  for:   1. Dyspnea on exertion  - CT Chest High Resolution; Future  Walk today in office  Lab work today  Continue Symbicort 160 >>> 2 puffs in the morning right when you wake up, rinse out your mouth after use, 12 hours later 2 puffs, rinse after use >>> Take this daily, no matter what >>> This is not a rescue inhaler    2. Wheezing  - IgE; Future - CBC w/Diff; Future  3. Shortness of breath  - CT Chest High Resolution; Future  4. Eosinophil Count Raised  - IgE; Future - CBC w/Diff; Future   We recommend today:  Orders Placed This Encounter  Procedures  . CT Chest High Resolution    -High-res CT with supine and prone positioning -inspiratory and expiratory cuts.  -Only to be read by Dr. Rosario Jacks and Dr. Weber Cooks.    Standing Status:   Future    Standing Expiration Date:   06/19/2020    Scheduling Instructions:     Complete by 05/06/2019    Order Specific Question:   Preferred imaging location?    Answer:   [place]    Order Specific Question:   Radiology Contrast Protocol - do NOT remove file path    Answer:   [email or web address]  . IgE    Standing Status:   Future    Standing Expiration Date:   04/18/2020  . CBC w/Diff    Standing Status:   Future    Standing Expiration Date:   04/18/2020   Orders Placed This Encounter  Procedures  . CT Chest High Resolution  . IgE  . CBC w/Diff   No orders of the defined types were placed in this encounter.   Follow Up:    Return in about 4 weeks (around 05/17/2019), or if symptoms worsen or fail to improve, for Follow up with Dr. Valeta Harms, Follow up with Wyn Quaker FNP-C, After Chest CT.   Please do your part to reduce the spread of COVID-19:      Reduce your risk of any infection  and COVID19 by using the similar precautions used for avoiding the common cold or flu:  Marland Kitchen Wash your hands often with soap and warm water for at least 20  seconds.  If soap and water are not readily available, use an alcohol-based hand sanitizer with at least 60% alcohol.  . If coughing or sneezing, cover your mouth and nose by coughing or sneezing into the elbow areas of your shirt or coat, into a tissue or into your sleeve (not your hands). Langley Gauss A MASK when in public  . Avoid shaking hands with others and consider head nods or verbal greetings only. . Avoid touching your eyes, nose, or mouth with unwashed hands.  . Avoid close contact with people who are sick. . Avoid places or events with large numbers of people in one location, like concerts or sporting events. . If you have some symptoms but not all symptoms, continue to monitor at home and seek medical attention if your symptoms worsen. . If you are having a medical emergency, call 911.   [facility] / e-Visit: [email or web address]         [facility]: [facility]: [id number]                   [facility]: [phone]     It is flu season:   >>> Best ways to protect herself from the flu: Receive the yearly flu vaccine, practice good hand hygiene washing with soap and also using hand sanitizer when available, eat a nutritious meals, get adequate rest, hydrate appropriately   Please contact the office if your symptoms worsen or you have concerns that you are not improving.   Thank you for choosing [facility] for your healthcare, and for allowing [place] to partner with you on your healthcare journey. I am thankful to be able to provide care to you today.   Wyn Quaker FNP-C

## 2019-04-19 NOTE — Assessment & Plan Note (Signed)
Plan: Continue Symbicort 160 Order high-resolution CT chest IgE, CBC with differential ordered today Follow-up in 2 to 4 weeks

## 2019-04-19 NOTE — Assessment & Plan Note (Signed)
Plan: Lab work today 

## 2019-04-19 NOTE — Progress Notes (Signed)
Full PFT performed today. °

## 2019-04-19 NOTE — Assessment & Plan Note (Signed)
Elevated eosinophil count in 2019  Plan: Continue Symbicort 160 Lab work today: IgE, CBC with differential

## 2019-04-20 ENCOUNTER — Telehealth: Payer: Self-pay | Admitting: Pulmonary Disease

## 2019-04-20 LAB — CBC WITH DIFFERENTIAL/PLATELET
Basophils Absolute: 0.2 10*3/uL — ABNORMAL HIGH (ref 0.0–0.1)
Basophils Relative: 1 % (ref 0.0–3.0)
Eosinophils Absolute: 0.6 10*3/uL (ref 0.0–0.7)
Eosinophils Relative: 3.8 % (ref 0.0–5.0)
HCT: 36.6 % (ref 36.0–46.0)
Hemoglobin: 11.9 g/dL — ABNORMAL LOW (ref 12.0–15.0)
Lymphocytes Relative: 31.6 % (ref 12.0–46.0)
Lymphs Abs: 5.2 10*3/uL — ABNORMAL HIGH (ref 0.7–4.0)
MCHC: 32.5 g/dL (ref 30.0–36.0)
MCV: 88.3 fl (ref 78.0–100.0)
Monocytes Absolute: 1 10*3/uL (ref 0.1–1.0)
Monocytes Relative: 6.2 % (ref 3.0–12.0)
Neutro Abs: 9.4 10*3/uL — ABNORMAL HIGH (ref 1.4–7.7)
Neutrophils Relative %: 57.4 % (ref 43.0–77.0)
Platelets: 334 10*3/uL (ref 150.0–400.0)
RBC: 4.15 Mil/uL (ref 3.87–5.11)
RDW: 15 % (ref 11.5–15.5)
WBC: 16.4 10*3/uL — ABNORMAL HIGH (ref 4.0–10.5)

## 2019-04-20 LAB — IGE: IgE (Immunoglobulin E), Serum: 3 kU/L (ref <=114)

## 2019-04-20 MED ORDER — BUDESONIDE-FORMOTEROL FUMARATE 80-4.5 MCG/ACT IN AERO: 2.0000 | INHALATION_SPRAY | Freq: Two times a day (BID) | RESPIRATORY_TRACT | 5 refills | Status: DC

## 2019-04-20 NOTE — Progress Notes (Signed)
Allergy marker IgE test normal, allergy marker eosinophil still elevated but lower than where they were a year ago.  Proceed forward with high-resolution CT chest and follow-up with our office.  Wyn Quaker, FNP

## 2019-04-20 NOTE — Telephone Encounter (Signed)
Called and spoke with pt's daughter Ulice Dash letting her know that we did not have any samples of symbicort. Looks like an Rx had never been sent in for pt. I told Ulice Dash that I could send Rx to pharmacy for pt and she stated to do that. Verified pt's preferred pharmacy and sent Rx in for pt. Nothing further needed.

## 2019-04-26 DIAGNOSIS — M5416 Radiculopathy, lumbar region: Secondary | ICD-10-CM | POA: Diagnosis not present

## 2019-04-30 ENCOUNTER — Ambulatory Visit
Admission: RE | Admit: 2019-04-30 | Discharge: 2019-04-30 | Disposition: A | Discharge status: Home / Self Care | Payer: Medicare HMO | Source: Ambulatory Visit | Attending: Pulmonary Disease | Admitting: Pulmonary Disease

## 2019-04-30 ENCOUNTER — Other Ambulatory Visit: Payer: Medicare HMO

## 2019-04-30 DIAGNOSIS — R0602 Shortness of breath: Secondary | ICD-10-CM

## 2019-04-30 DIAGNOSIS — R0609 Other forms of dyspnea: Secondary | ICD-10-CM

## 2019-05-02 DIAGNOSIS — M5416 Radiculopathy, lumbar region: Secondary | ICD-10-CM | POA: Diagnosis not present

## 2019-05-08 DIAGNOSIS — M5126 Other intervertebral disc displacement, lumbar region: Secondary | ICD-10-CM | POA: Diagnosis not present

## 2019-05-08 DIAGNOSIS — M48061 Spinal stenosis, lumbar region without neurogenic claudication: Secondary | ICD-10-CM | POA: Diagnosis not present

## 2019-05-08 DIAGNOSIS — M5416 Radiculopathy, lumbar region: Secondary | ICD-10-CM | POA: Diagnosis not present

## 2019-05-08 NOTE — Progress Notes (Signed)
@Patient  ID: Hannah [name], female    DOB: Sep 28, 1932, 85 y.o.   MRN: [id number]  Chief Complaint  Patient presents with  . Follow-up    Per daughter, still having issues with SOB. Has not noticed a change since last visit.     Referring provider: Donald Prose, MD  HPI:  85 year old never smoker female initially referred to our office on 02/20/2019 for evaluation of dyspnea and shortness of breath  PMH: Hayfever, history of tuberculosis in 1978, nonrheumatic mitral valve regurgitation, oral cancer 2012, radiation for oral cancer in 2013 Smoker/ Smoking History: Never smoker Maintenance: Symbicort 160 Pt of: Dr. Valeta Harms  05/09/2019  - Visit   85 year old female never smoker followed in our office for dyspnea on exertion.  She was consulted with our practice on 02/20/2019 status post an evaluation by cardiology.  Patient continues to have persistent fatigue.  Patient is completing pulmonary function testing as well as high-resolution CT chest with our office.  Those results are listed below:  04/19/2019-pulmonary function test-FVC 1.53 (69% predicted), postbronchodilator ratio 69, postbronchodilator FEV1 1.08 (66% predicted), no bronchodilator response, mid flow reversibility, DLCO 9.57 (55% predicted) >>>Of note patient did use her Symbicort this morning  04/30/2019-CT chest high-res-lungs biapical pleural-parenchymal scarring, right greater than left as seen on April/01/2018 CT.  Negative for ILD,, aortic arthrosclerosis  Patient continues to have ongoing symptoms.  She has started taking Symbicort 160 which she does feel helps especially in the morning.  She feels that this wears off quickly though.  Lab work was negative for an elevated IgE.  She does have a history of elevated peripheral eosinophilia.  She does not have persistent rhinitis, wheezing, cough, asthma-like symptoms.  Pulmonary function test does show mid flow reversibility.  Also shows restriction and a moderately reduced  DLCO.  Patient was last seen by cardiology in July/2020.  At that point in time there were not any known persistent cardiac issues that were felt to be driving patient's persistent fatigue and was recommend that she follow back up with primary care which is what prompted the pulmonary evaluation.  Patient's last echocardiogram in June/2020 showed normal ejection fraction, mild to moderate mitral regurgitation, mildly dilated left atrial cavity and grade 2 diastolic dysfunction.  Patient does not appear fluid overloaded.  She was given a trial of diuretics by cardiology she was felt to be euvolemic and diuretics were stopped out of concerns for causing dehydration.  Last cardiology evaluation was on 11/15/2018.  Patient was requested to follow-up on as-needed basis.  TSH stable at that time.  Patient's daughter and patient present today and they are concerned that the symptoms continue to persist.   Tests:   04/19/2019-pulmonary function test-FVC 1.53 (69% predicted), postbronchodilator ratio 69, postbronchodilator FEV1 1.08 (66% predicted), no bronchodilator response, mid flow reversibility, DLCO 9.57 (55% predicted) >>>Of note patient did use her Symbicort this morning  04/30/2019-CT chest high-res-lungs biapical pleural-parenchymal scarring, right greater than left as seen on April/01/2018 CT.  Negative for ILD,, aortic arthrosclerosis  10/26/2018-echocardiogram-LV ejection fraction [id number], grade 2 diastolic dysfunction, mildly dilated left atrial cavity, mild to moderate mitral regurgitation  FENO:  No results found for: NITRICOXIDE  PFT: PFT Results Latest Ref Rng & Units 04/19/2019  FVC-Pre L 1.53  FVC-Predicted Pre % 69  FVC-Post L 1.57  FVC-Predicted Post % 71  Pre FEV1/FVC % % 66  Post FEV1/FCV % % 69  FEV1-Pre L 1.01  FEV1-Predicted Pre % 62  FEV1-Post L 1.08  DLCO UNC% %  55  DLCO COR %Predicted % 86  TLC L 3.87  TLC % Predicted % 81  RV % Predicted % 101    WALK:  SIX MIN WALK  04/19/2019  Supplimental Oxygen during Test? (L/min) No  Tech Comments: Patient was able to complete 2 laps at a steady pace. Denied any SOB or CP during or after walking. No O2 needed during or after walk.    Imaging: CT Chest High Resolution  Result Date: 04/30/2019 CLINICAL DATA:  Chronic shortness of breath. Remote history of tuberculosis. EXAM: CT CHEST WITHOUT CONTRAST TECHNIQUE: Multidetector CT imaging of the chest was performed following the standard protocol without intravenous contrast. High resolution imaging of the lungs, as well as inspiratory and expiratory imaging, was performed. COMPARISON:  08/24/2007. FINDINGS: Cardiovascular: Atherosclerotic calcification of the aorta and left anterior descending coronary artery. Heart size normal. No pericardial effusion. Mediastinum/Nodes: No pathologically enlarged mediastinal or axillary lymph nodes. Hilar regions are difficult to definitively evaluate without IV contrast but appear grossly unremarkable. Esophagus is grossly unremarkable. Lungs/Pleura: Biapical pleuroparenchymal scarring, right greater than left, as on 08/24/2007. Additional mild scattered pulmonary parenchymal scarring bilaterally. Negative for subpleural reticulation, traction bronchiectasis/bronchiolectasis, ground-glass, architectural distortion or honeycombing. Nodular scarring in the superior segment left lower lobe, unchanged. No air trapping. No pleural fluid. Airway is unremarkable. Upper Abdomen: Millimetric low-attenuation lesion in the right hepatic lobe is too small to characterize. Visualized portions of the liver, adrenal glands, left kidney, spleen, pancreas, stomach and bowel are grossly unremarkable. Musculoskeletal: Degenerative changes in the spine. No worrisome lytic or sclerotic lesions. IMPRESSION: 1. Negative for interstitial lung disease. No pulmonary parenchymal findings to explain the patient's shortness of breath. 2. Aortic atherosclerosis (ICD10-170.0).  Left anterior descending coronary artery calcification. Electronically Signed   By: Lorin Picket M.D.   On: 04/30/2019 11:36    Lab Results:  CBC    Component Value Date/Time   WBC 16.4 Repeated and verified X2. (H) 04/19/2019 1553   RBC 4.15 04/19/2019 1553   HGB 11.9 (L) 04/19/2019 1553   HGB 12.0 11/15/2018 0945   HCT 36.6 04/19/2019 1553   HCT 37.5 11/15/2018 0945   PLT 334.0 04/19/2019 1553   PLT 247 11/15/2018 0945   MCV 88.3 04/19/2019 1553   MCV 84 11/15/2018 0945   MCH 26.9 11/15/2018 0945   MCH 26.2 04/06/2018 1427   MCHC 32.5 04/19/2019 1553   RDW 15.0 04/19/2019 1553   RDW 13.9 11/15/2018 0945   LYMPHSABS 5.2 (H) 04/19/2019 1553   MONOABS 1.0 04/19/2019 1553   EOSABS 0.6 04/19/2019 1553   BASOSABS 0.2 (H) 04/19/2019 1553    BMET    Component Value Date/Time   NA 138 03/29/2019 0933   NA 142 11/15/2018 0945   K 4.8 03/29/2019 0933   CL 105 03/29/2019 0933   CO2 22 03/29/2019 0933   GLUCOSE 124 (H) 03/29/2019 0933   BUN 9 03/29/2019 0933   BUN 17 11/15/2018 0945   CREATININE 0.91 03/29/2019 0933   CALCIUM 9.4 03/29/2019 0933   GFRNONAA 58 (L) 03/29/2019 0933   GFRAA >60 03/29/2019 0933    BNP No results found for: BNP  ProBNP No results found for: PROBNP  Specialty Problems      Pulmonary Problems   Dyspnea on exertion   Chronic obstructive asthma (HCC)   Decreased diffusion capacity   Wheezing   Restrictive lung disease      Allergies  Allergen Reactions  . Penicillins     UNSPECIFIED REACTION   .  Sulfa Antibiotics     UNSPECIFIED REACTION       There is no immunization history on file for this patient.  Past Medical History:  Diagnosis Date  . Depression   . Diabetes mellitus   . Hypercholesteremia   . Hypertension   . Hypothyroidism   . Oral cancer [facility]) 2012   surgery and radiation  . Tuberculosis    1978  . Weight loss, unintentional     Tobacco History: Social History   Tobacco Use  Smoking Status Never  Smoker  Smokeless Tobacco Never Used   Counseling given: Yes   Continue to not smoke  Outpatient Encounter Medications as of 05/09/2019  Medication Sig  . acetaminophen (TYLENOL) 325 MG tablet Take 650 mg by mouth every 6 (six) hours as needed.  Marland Kitchen albuterol (VENTOLIN HFA) 108 (90 Base) MCG/ACT inhaler Inhale 2 puffs into the lungs every 6 (six) hours as needed for wheezing or shortness of breath.  Marland Kitchen augmented betamethasone dipropionate (DIPROLENE-AF) 0.05 % cream Apply 1 application topically daily.  . budesonide-formoterol (SYMBICORT) 80-4.5 MCG/ACT inhaler Inhale 2 puffs into the lungs 2 (two) times daily.  . cyanocobalamin 1000 MCG tablet Take 1,000 mcg by mouth daily.  . diclofenac sodium (VOLTAREN) 1 % GEL Apply 2 g topically 3 (three) times daily as needed (joint pain).  Marland Kitchen donepezil (ARICEPT) 5 MG tablet Take 1 tablet by mouth daily.  Marland Kitchen ezetimibe-simvastatin (VYTORIN) 10-20 MG per tablet Take 1 tablet by mouth at bedtime.  . Fluticasone Propionate 0.05 % LOTN Apply 1 application topically as needed.   Marland Kitchen HYDROcodone-acetaminophen (NORCO/VICODIN) 5-325 MG tablet Take 1 tablet by mouth every 6 (six) hours as needed for moderate pain.  . hydrocortisone cream 1 % Apply 1 application topically as needed (rash).  Marland Kitchen levothyroxine (SYNTHROID, LEVOTHROID) 50 MCG tablet Take 25-50 mcg by mouth See admin instructions. Take 1/2 tablet (92mcg) alternating with 1 tablet (84mcg) every other day.  . lidocaine (XYLOCAINE) 2 % solution Use as directed 3 mLs in the mouth or throat as needed.  . megestrol (MEGACE) 400 MG/10ML suspension Take by mouth daily.  . methylPREDNISolone (MEDROL DOSEPAK) 4 MG TBPK tablet   . sertraline (ZOLOFT) 100 MG tablet Take 50 mg by mouth daily. Take 1/2 tablet daily  . sitaGLIPtin (JANUVIA) 25 MG tablet Take 25 mg by mouth daily.   No facility-administered encounter medications on file as of 05/09/2019.     Review of Systems  Review of Systems  Constitutional:  Positive for fatigue. Negative for activity change and fever.  HENT: Negative for sinus pressure, sinus pain and sore throat.   Respiratory: Positive for shortness of breath. Negative for cough and wheezing.   Cardiovascular: Negative for chest pain and palpitations.  Gastrointestinal: Negative for diarrhea, nausea and vomiting.  Musculoskeletal: Negative for arthralgias.  Neurological: Negative for dizziness.  Psychiatric/Behavioral: Negative for sleep disturbance. The patient is not nervous/anxious.      Physical Exam  BP 112/74   Pulse 90   Temp 98.2 F (36.8 C) (Temporal)   Ht 5\' 2"  (1.575 m)   Wt 97 lb 6.4 oz (44.2 kg)   SpO2 98%   BMI 17.81 kg/m   Wt Readings from Last 5 Encounters:  05/09/19 97 lb 6.4 oz (44.2 kg)  04/19/19 97 lb (44 kg)  04/02/19 97 lb (44 kg)  02/20/19 95 lb 9.6 oz (43.4 kg)  11/15/18 88 lb 11.2 oz (40.2 kg)    BMI Readings from Last 5 Encounters:  05/09/19  17.81 kg/m  04/19/19 17.74 kg/m  04/02/19 17.74 kg/m  02/20/19 17.49 kg/m  11/15/18 16.76 kg/m     Physical Exam Vitals and nursing note reviewed.  Constitutional:      General: She is not in acute distress.    Appearance: Normal appearance. She is normal weight.     Comments: Thin elderly female  HENT:     Head: Normocephalic and atraumatic.     Right Ear: Tympanic membrane, ear canal and external ear normal. There is no impacted cerumen.     Left Ear: Tympanic membrane, ear canal and external ear normal. There is no impacted cerumen.     Nose: Rhinorrhea present. No congestion.     Mouth/Throat:     Mouth: Mucous membranes are moist.     Pharynx: Oropharynx is clear.  Eyes:     Pupils: Pupils are equal, round, and reactive to light.  Cardiovascular:     Rate and Rhythm: Normal rate and regular rhythm.     Pulses: Normal pulses.     Heart sounds: Normal heart sounds. No murmur.  Pulmonary:     Effort: Pulmonary effort is normal. No respiratory distress.     Breath sounds:  No decreased air movement. No decreased breath sounds, wheezing or rales.  Musculoskeletal:     Cervical back: Normal range of motion.     Right lower leg: No edema.     Left lower leg: No edema.  Skin:    General: Skin is warm and dry.     Capillary Refill: Capillary refill takes less than 2 seconds.  Neurological:     General: No focal deficit present.     Mental Status: She is alert and oriented to person, place, and time. Mental status is at baseline.     Gait: Gait normal.  Psychiatric:        Mood and Affect: Mood normal.        Behavior: Behavior normal.        Thought Content: Thought content normal.        Judgment: Judgment normal.       Assessment & Plan:   Nonrheumatic mitral valve regurgitation Plan: May be beneficial to return back to cardiology to see if they have any other additional thoughts regarding symptoms  Chronic obstructive asthma ([place]) Potential asthma, mid flow reversibility on pulmonary function test Patient did take Symbicort 160 prior to pulmonary function test Patient continues to have peripheral eosinophilia Negative IgE Patient feels symptomatic/clinical improvement with Symbicort 160  Plan: Continue Symbicort 160 Offered allergy referral for the patient today patient's daughter declined   Restrictive lung disease Restrictive pattern on pulmonary function testing Negative high-resolution CT chest  Plan: We will continue to monitor clinically   Decreased diffusion capacity High-resolution CT chest negative  Dyspnea on exertion Plan: Continue Symbicort 160 Follow-up with our office in 3 to 6 months Believe would be best for her to follow back up with cardiology to see if they have any other additional thoughts regarding your symptoms  Eosinophilic leukocytosis Elevated eosinophil count on recent lab work  Plan: We will continue to monitor clinically Offered allergy referral today, patient's daughter declined    Return in  about 3 months (around 08/07/2019), or if symptoms worsen or fail to improve, for Follow up with Dr. Valeta Harms.   Lauraine Rinne, NP 05/09/2019   This appointment was 35 minutes long with over 50% of the time in direct face-to-face patient care, assessment, plan of care,  and follow-up.

## 2019-05-09 ENCOUNTER — Ambulatory Visit (INDEPENDENT_AMBULATORY_CARE_PROVIDER_SITE_OTHER): Payer: Medicare HMO | Admitting: Pulmonary Disease

## 2019-05-09 ENCOUNTER — Other Ambulatory Visit: Payer: Self-pay

## 2019-05-09 ENCOUNTER — Encounter: Payer: Self-pay | Admitting: Pulmonary Disease

## 2019-05-09 VITALS — BP 112/74 | HR 90 | Temp 98.2°F | Ht 62.0 in | Wt 97.4 lb

## 2019-05-09 DIAGNOSIS — R942 Abnormal results of pulmonary function studies: Secondary | ICD-10-CM | POA: Diagnosis not present

## 2019-05-09 DIAGNOSIS — D7219 Other eosinophilia: Secondary | ICD-10-CM

## 2019-05-09 DIAGNOSIS — I340 Nonrheumatic mitral (valve) insufficiency: Secondary | ICD-10-CM | POA: Diagnosis not present

## 2019-05-09 DIAGNOSIS — J984 Other disorders of lung: Secondary | ICD-10-CM | POA: Diagnosis not present

## 2019-05-09 DIAGNOSIS — R0600 Dyspnea, unspecified: Secondary | ICD-10-CM

## 2019-05-09 DIAGNOSIS — J449 Chronic obstructive pulmonary disease, unspecified: Secondary | ICD-10-CM | POA: Diagnosis not present

## 2019-05-09 DIAGNOSIS — R0609 Other forms of dyspnea: Secondary | ICD-10-CM

## 2019-05-09 NOTE — Patient Instructions (Addendum)
You were seen today by Lauraine Rinne, NP  for:   1. Dyspnea on exertion 2. Decreased diffusion capacity 3. Restrictive lung disease  Continue Symbicort 160 >>> 2 puffs in the morning right when you wake up, rinse out your mouth after use, 12 hours later 2 puffs, rinse after use >>> Take this daily, no matter what >>> This is not a rescue inhaler   Only use your albuterol as a rescue medication to be used if you can't catch your breath by resting or doing a relaxed purse lip breathing pattern.  - The less you use it, the better it will work when you need it. - Ok to use up to 2 puffs  every 4 hours if you must but call for immediate appointment if use goes up over your usual need - Don't leave home without it !!  (think of it like the spare tire for your car)   Believe it is reasonable for you to follow back up with cardiology   I will discuss your case with Dr. Valeta Harms to see if he has any other additional thoughts regarding how best to evaluate your dyspnea on exertion  Follow Up:    Return in about 3 months (around 08/07/2019), or if symptoms worsen or fail to improve, for Follow up with Dr. Valeta Harms.   Please do your part to reduce the spread of COVID-19:      Reduce your risk of any infection  and COVID19 by using the similar precautions used for avoiding the common cold or flu:  Marland Kitchen Wash your hands often with soap and warm water for at least 20 seconds.  If soap and water are not readily available, use an alcohol-based hand sanitizer with at least 60% alcohol.  . If coughing or sneezing, cover your mouth and nose by coughing or sneezing into the elbow areas of your shirt or coat, into a tissue or into your sleeve (not your hands). Langley Gauss A MASK when in public  . Avoid shaking hands with others and consider head nods or verbal greetings only. . Avoid touching your eyes, nose, or mouth with unwashed hands.  . Avoid close contact with people who are sick. . Avoid places or events with  large numbers of people in one location, like concerts or sporting events. . If you have some symptoms but not all symptoms, continue to monitor at home and seek medical attention if your symptoms worsen. . If you are having a medical emergency, call 911.   [place] / e-Visit: [email or web address]         [facility]: [facility]: [id number]                   [facility]: [id number]     It is flu season:   >>> Best ways to protect herself from the flu: Receive the yearly flu vaccine, practice good hand hygiene washing with soap and also using hand sanitizer when available, eat a nutritious meals, get adequate rest, hydrate appropriately   Please contact the office if your symptoms worsen or you have concerns that you are not improving.   Thank you for choosing [facility] for your healthcare, and for allowing [place] to partner with you on your healthcare journey. I am thankful to be able to provide care to you today.   Wyn Quaker FNP-C

## 2019-05-09 NOTE — Assessment & Plan Note (Signed)
Plan: May be beneficial to return back to cardiology to see if they have any other additional thoughts regarding symptoms

## 2019-05-09 NOTE — Assessment & Plan Note (Signed)
Plan: Continue Symbicort 160 Follow-up with our office in 3 to 6 months Believe would be best for her to follow back up with cardiology to see if they have any other additional thoughts regarding your symptoms

## 2019-05-09 NOTE — Assessment & Plan Note (Signed)
Elevated eosinophil count on recent lab work  Plan: We will continue to monitor clinically Offered allergy referral today, patient's daughter declined

## 2019-05-09 NOTE — Assessment & Plan Note (Signed)
High-resolution CT chest negative

## 2019-05-09 NOTE — Assessment & Plan Note (Signed)
Potential asthma, mid flow reversibility on pulmonary function test Patient did take Symbicort 160 prior to pulmonary function test Patient continues to have peripheral eosinophilia Negative IgE Patient feels symptomatic/clinical improvement with Symbicort 160  Plan: Continue Symbicort 160 Offered allergy referral for the patient today patient's daughter declined

## 2019-05-09 NOTE — Assessment & Plan Note (Signed)
Restrictive pattern on pulmonary function testing Negative high-resolution CT chest  Plan: We will continue to monitor clinically

## 2019-05-17 ENCOUNTER — Other Ambulatory Visit: Payer: Self-pay | Admitting: Student

## 2019-05-17 ENCOUNTER — Ambulatory Visit: Payer: Medicare HMO | Admitting: Pulmonary Disease

## 2019-05-17 DIAGNOSIS — I10 Essential (primary) hypertension: Secondary | ICD-10-CM | POA: Diagnosis not present

## 2019-05-17 DIAGNOSIS — M5416 Radiculopathy, lumbar region: Secondary | ICD-10-CM

## 2019-05-17 NOTE — Progress Notes (Signed)
PCCM: thanks for seeing her Garner Nash, DO [facility] 05/17/2019 1:00 PM

## 2019-05-18 HISTORY — PX: LUMBAR SPINE SURGERY: SHX701

## 2019-05-22 ENCOUNTER — Other Ambulatory Visit: Payer: Self-pay

## 2019-05-22 ENCOUNTER — Ambulatory Visit
Admission: RE | Admit: 2019-05-22 | Discharge: 2019-05-22 | Disposition: A | Discharge status: Home / Self Care | Payer: Medicare Other | Source: Ambulatory Visit | Attending: Student | Admitting: Student

## 2019-05-22 DIAGNOSIS — M5416 Radiculopathy, lumbar region: Secondary | ICD-10-CM

## 2019-05-22 MED ORDER — IOPAMIDOL (ISOVUE-M 200) INJECTION 41%
1.0000 mL | Freq: Once | INTRAMUSCULAR | Status: AC
  Administered 2019-05-22: 1 mL via EPIDURAL

## 2019-05-22 MED ORDER — METHYLPREDNISOLONE ACETATE 40 MG/ML INJ SUSP (RADIOLOG
120.0000 mg | Freq: Once | INTRAMUSCULAR | Status: AC
  Administered 2019-05-22: 120 mg via EPIDURAL

## 2019-05-22 NOTE — Discharge Instructions (Signed)

## 2019-07-30 ENCOUNTER — Other Ambulatory Visit: Payer: Self-pay | Admitting: Neurological Surgery

## 2019-07-30 DIAGNOSIS — M5416 Radiculopathy, lumbar region: Secondary | ICD-10-CM

## 2019-08-10 ENCOUNTER — Other Ambulatory Visit: Payer: Self-pay

## 2019-08-10 ENCOUNTER — Ambulatory Visit
Admission: RE | Admit: 2019-08-10 | Discharge: 2019-08-10 | Disposition: A | Discharge status: Home / Self Care | Payer: Medicare Other | Source: Ambulatory Visit | Attending: Neurological Surgery | Admitting: Neurological Surgery

## 2019-08-10 DIAGNOSIS — M5416 Radiculopathy, lumbar region: Secondary | ICD-10-CM

## 2019-08-10 MED ORDER — GADOBENATE DIMEGLUMINE 529 MG/ML IV SOLN
8.0000 mL | Freq: Once | INTRAVENOUS | Status: AC | PRN
  Administered 2019-08-10: 8 mL via INTRAVENOUS

## 2019-08-30 ENCOUNTER — Encounter: Payer: Self-pay | Admitting: Cardiology

## 2019-08-30 ENCOUNTER — Ambulatory Visit: Payer: Medicare Other | Admitting: Cardiology

## 2019-08-30 ENCOUNTER — Other Ambulatory Visit: Payer: Self-pay

## 2019-08-30 VITALS — BP 134/57 | HR 86 | Temp 97.1°F | Resp 18 | Ht 62.0 in | Wt 93.8 lb

## 2019-08-30 DIAGNOSIS — E119 Type 2 diabetes mellitus without complications: Secondary | ICD-10-CM

## 2019-08-30 DIAGNOSIS — I700 Atherosclerosis of aorta: Secondary | ICD-10-CM

## 2019-08-30 DIAGNOSIS — E875 Hyperkalemia: Secondary | ICD-10-CM

## 2019-08-30 DIAGNOSIS — R0602 Shortness of breath: Secondary | ICD-10-CM

## 2019-08-30 DIAGNOSIS — I2510 Atherosclerotic heart disease of native coronary artery without angina pectoris: Secondary | ICD-10-CM

## 2019-08-30 NOTE — Progress Notes (Addendum)
Primary Physician/Referring:  Donald Prose, MD  Patient ID: Hannah [name], female    DOB: [birth year], 86 y.o.   MRN: [id number]  Chief Complaint  Patient presents with  . Shortness of Breath  . Fatigue  . Follow-up   HPI:    Hannah [name]  is a 86 y.o.  [place]ad female with mild dementia, h/o oral cancer in remission, type 2 DM, depression, with worsening exertional dyspnea and fatigue symptoms.  Cardiac workup showed echocardiogram with normal LF, grade II diastolic dysfunction, no ischemia/infarct on stress test. High resolution CT had revealed no pulmonary abnormality (H/O TB), but aortic atherosclerosis and LAD calcification on 04/30/2019.   Patient is here with her daughter. Patient continues to have complaints of generalized fatigue, marked dyspnea on exertion and after extensive pulmonary evaluation no etiology has been found.  Patient's quality of life has been extremely poor, unable to do even minimal activities.  No chest pain, no palpitations.  Over the past 6 months, diabetes has been uncontrolled as patient was allowed to eat any food she prefers as she was losing significant amount of weight as well.  She has gained about 5 to 6 pounds in weight since then hemoglobin A1c has worsened.  Past Medical History:  Diagnosis Date  . Depression   . Diabetes mellitus   . Hypercholesteremia   . Hypertension   . Hypothyroidism   . Oral cancer [facility]) 2012   surgery and radiation  . Tuberculosis    1978  . Weight loss, unintentional    Past Surgical History:  Procedure Laterality Date  . COLONOSCOPY    . EYE SURGERY Bilateral    cataracts  . LIPOMA EXCISION     back  . LUMBAR LAMINECTOMY/DECOMPRESSION MICRODISCECTOMY Right 04/12/2018   Procedure: Laminectomy and Foraminotomy - Lumbar four-Lumbar five - Lumbar five-Sacral one - right;  Surgeon: Eustace Moore, MD;  Location: [place];  Service: Neurosurgery;  Laterality: Right;  . LUMBAR SPINE SURGERY  05/2019  .  MASS EXCISION Left 04/02/2019   Procedure: EXCISION OF LIPOMATOUS MASS OF THE LEFT BACK;  Surgeon: Johnathan Hausen, MD;  Location: [place];  Service: General;  Laterality: Left;  . OTHER SURGICAL HISTORY     squamous cell removal;skin graft  . TOOTH EXTRACTION     all pulled   Family History  Problem Relation Age of Onset  . Arthritis Mother        Late 22s  . Heart attack Father        Deceased, 20  . Heart disease Brother   . Hypertension Other   . Hyperlipidemia Other   . Diabetes type II Sister   . Diabetes Brother     Social History   Tobacco Use  . Smoking status: Never Smoker  . Smokeless tobacco: Never Used  Substance Use Topics  . Alcohol use: No    Alcohol/week: 0.0 standard drinks   ROS  Review of Systems  Constitution: Positive for malaise/fatigue.  Cardiovascular: Positive for dyspnea on exertion. Negative for chest pain and leg swelling.  Gastrointestinal: Negative for melena.   Objective  Blood pressure (!) 134/57, pulse 86, temperature (!) 97.1 F (36.2 C), resp. rate 18, height 5' 2"  (1.575 m), weight 93 lb 12.8 oz (42.5 kg), SpO2 100 %.  Vitals with BMI 08/30/2019 05/22/2019 05/09/2019  Height 5' 2"  - 5' 2"   Weight 93 lbs 13 oz - 97 lbs 6 oz  BMI 17.15 - 17.81  Systolic 953 202 334  Diastolic 57 61 74  Pulse 86 85 90     Physical Exam  Constitutional:  She is short stature and well-nourished, appears to be no acute distress.  Cardiovascular: Normal rate, regular rhythm, normal heart sounds and intact distal pulses. Exam reveals no gallop.  No murmur heard. No leg edema, no JVD.  Pulmonary/Chest: Effort normal and breath sounds normal.  Abdominal: Soft. Bowel sounds are normal.   Laboratory examination:   Recent Labs    11/15/18 0945 03/29/19 0933 09/14/19 0837  NA 142 138 141  K 5.5* 4.8 5.9*  CL 101 105 107*  CO2 26 22 21   GLUCOSE 103* 124* 242*  BUN 17 9 11   CREATININE 0.91 0.91 0.85  CALCIUM 10.4* 9.4 9.7    GFRNONAA 59* 58* 63  GFRAA 67 >60 73   estimated creatinine clearance is 33.1 mL/min (by C-G formula based on SCr of 0.85 mg/dL).  CMP Latest Ref Rng & Units 09/14/2019 03/29/2019 11/15/2018  Glucose 65 - 99 mg/dL 242(H) 124(H) 103(H)  BUN 8 - 27 mg/dL 11 9 17   Creatinine 0.57 - 1.00 mg/dL 0.85 0.91 0.91  Sodium 134 - 144 mmol/L 141 138 142  Potassium 3.5 - 5.2 mmol/L 5.9(H) 4.8 5.5(H)  Chloride 96 - 106 mmol/L 107(H) 105 101  CO2 20 - 29 mmol/L 21 22 26   Calcium 8.7 - 10.3 mg/dL 9.7 9.4 10.4(H)  Total Protein 6.0 - 8.5 g/dL - - 7.0  Total Bilirubin 0.0 - 1.2 mg/dL - - 0.2  Alkaline Phos 39 - 117 IU/L - - 71  AST 0 - 40 IU/L - - 29  ALT 0 - 32 IU/L - - 12   CBC Latest Ref Rng & Units 09/14/2019 04/19/2019 11/15/2018  WBC 3.4 - 10.8 x10E3/uL 10.2 16.4 Repeated and verified X2.(H) 10.3  Hemoglobin 11.1 - 15.9 g/dL 11.8 11.9(L) 12.0  Hematocrit 34.0 - 46.6 % 37.4 36.6 37.5  Platelets 150 - 450 x10E3/uL 267 334.0 247   Lipid Panel  No results found for: CHOL, TRIG, HDL, CHOLHDL, VLDL, LDLCALC, LDLDIRECT HEMOGLOBIN A1C No results found for: HGBA1C, MPG TSH Recent Labs    11/15/18 0945  TSH 1.270   External labs:   Recent labs:  Cholesterol, total 330.000 03/19/2019 Triglycerides 256.000 03/19/2019 HDL 43.000 03/19/2019 LDL 236.000 03/19/2019  A1C 8.500 03/19/2019 TSH 3.130 03/19/2019  Hemoglobin 11.500 07/18/2019 INR 1.000 04/06/2018 Platelets 334.000 04/19/2019  Creatinine, Serum 0.870 MG/ 05/02/2019 Potassium 4.800 03/29/2019 Magnesium N/D ALT (SGPT) 14.000 03/19/2019  04/28/2018:  Cholesterol 171, triglycerides 155, HDL 51, LDL 87.   Hemoglobin A1c 7.9.  CBC normal.  Creatinine 0.73, EGFR 78/92, potassium 4.1, CMP normal.  TSH 3.9.  Medications and allergies   Allergies  Allergen Reactions  . Penicillins Rash    1980's  . Sulfa Antibiotics Rash     Current Outpatient Medications  Medication Instructions  . acetaminophen (TYLENOL) 650 mg, Oral, Every 6 hours PRN   . albuterol (VENTOLIN HFA) 108 (90 Base) MCG/ACT inhaler 2 puffs, Inhalation, Every 6 hours PRN  . aspirin EC 81 mg, Oral, Daily  . augmented betamethasone dipropionate (DIPROLENE-AF) 3.56 % cream 1 application, Topical, Daily PRN  . budesonide-formoterol (SYMBICORT) 80-4.5 MCG/ACT inhaler 2 puffs, Inhalation, 2 times daily  . cyanocobalamin 1,000 mcg, Oral, Daily  . diclofenac sodium (VOLTAREN) 2 g, Topical, 3 times daily PRN  . donepezil (ARICEPT) 5 mg, Oral, Daily at bedtime  . ezetimibe-simvastatin (VYTORIN) 10-20 MG per tablet 1 tablet, Oral,  Daily  . Fluticasone Propionate 2.53 % LOTN 1 application, Topical, As needed  . HYDROcodone-acetaminophen (NORCO/VICODIN) 5-325 MG tablet 1 tablet, Oral, Every 6 hours PRN  . levothyroxine (SYNTHROID) 25-50 mcg, Oral, See admin instructions, Take 51mg alternating 563m every other day.  . lidocaine (XYLOCAINE) 2 % solution 3 mLs, Mouth/Throat, As needed  . megestrol (MEGACE) 400 mg, Oral, Daily  . sertraline (ZOLOFT) 100 mg, Oral, Daily  . sitaGLIPtin (JANUVIA) 25 mg, Oral, Daily  . Vitamin D-3 1,000 mg, Oral, Daily   Radiology:   No results found.  Cardiac Studies:   Echocardiogram 10/26/2018 :  Normal LV systolic function with EF 65%. Left ventricle cavity is normal in size. Normal left ventricular wall thickness. Normal global wall motion. Doppler evidence of grade II (pseudonormal) diastolic dysfunction, elevated LAP. Calculated EF 65%. Left atrial cavity is mildly dilated with LA volume of 45 ml.  Normal appearance of the mitral valve. Mild to moderate mitral regurgitation.  Lexiscan myoview stress test 08/07/2018:  1. Lexiscan stress test was performed. Exercise capacity was not assessed. Stress symptoms included dizziness and headache. Resting blood pressure was 112/60 mmHg and peak effect blood pressure was 120/62 mmHg. The resting and stress electrocardiogram demonstrated normal sinus rhythm, normal conduction, no arrhythmias and  normal repolarization. Stress EKG is non diagnostic for ischemia as it is a pharmacologic stress.  2. The overall quality of the study is good. There is no evidence of abnormal lung activity. Stress and rest SPECT images demonstrate homogeneous tracer distribution throughout the myocardium. Gated SPECT imaging reveals normal myocardial thickening and wall motion. The left ventricular ejection fraction was normal (62%).   3. Low risk study.  EKG  EKG 08/30/2019: Normal sinus rhythm with rate of 79 bpm, borderline criteria for left atrial enlargement, normal axis.  Poor R wave progression, probably normal variant, cannot exclude anteroseptal infarct old.  No evidence of ischemia, normal QT interval.      Assessment     ICD-10-CM   1. Shortness of breath  R06.02 EKG 12-Lead    Pulse oximetry, overnight    Brain natriuretic peptide  2. Coronary artery calcification seen on CAT scan  I25.10   3. Aortic atherosclerosis (HCC)  I70.0   4. Type 2 diabetes mellitus without complication, without long-term current use of insulin (HCC)  E1G64.4asic metabolic panel    CBC  5. Hyperkalemia  E87.5 Potassium     No orders of the defined types were placed in this encounter.   Medications Discontinued During This Encounter  Medication Reason  . methylPREDNISolone (MEDROL DOSEPAK) 4 MG TBPK tablet Completed Course    Recommendations:   Hannah GIRARDOTis a [age].o. [place] with mild dementia, h/o oral cancer in remission, type 2 DM, depression, with worsening exertional dyspnea and fatigue symptoms.  Cardiac workup showed echocardiogram with normal LF, grade II diastolic dysfunction, no ischemia/infarct on stress test. High resolution CT had revealed no pulmonary abnormality (H/O TB), but aortic atherosclerosis and LAD calcification on 04/30/2019.  Extensive pulmonary evaluation has been unyielding including PFTs, 6-minute walk test.  Patient's diabetes has been uncontrolled recently,  trying to gain weight she is allowed to eat any food she prefers, mostly eating carbohydrate diet.  Also her triglycerides have increased since then over the past 6 months as she is losing weight, she has now gained about 6 pounds in weight.  Due to continued symptoms of dyspnea, fatigue, will perform nocturnal oximetry, I will also set her  up for right and left heart catheterization.  I will make further recommendation after the procedure.  Patient is malnourished, very thin and frail, high risk for bleeding complication discussed with patient's daughter.  Patient's daughter states that her quality of life is so poor that she would accept any risks.  Addendum 09/15/19 K high and will recheck and also check BNP. Suspect diabetes related RTA  Adrian Prows, MD, [facility] 09/15/2019, 3:56 PM [facility]. [place] Office: [phone]

## 2019-08-30 NOTE — H&P (View-Only) (Signed)
Primary Physician/Referring:  Donald Prose, MD  Patient ID: Hannah [name], female    DOB: [birth year], 86 y.o.   MRN: [id number]  Chief Complaint  Patient presents with  . Shortness of Breath  . Fatigue  . Follow-up   HPI:    Hannah [name]  is a 86 y.o.  [place] female with mild dementia, h/o oral cancer in remission, type 2 DM, depression, with worsening exertional dyspnea and fatigue symptoms.  Cardiac workup showed echocardiogram with normal LF, grade II diastolic dysfunction, no ischemia/infarct on stress test. High resolution CT had revealed no pulmonary abnormality (H/O TB), but aortic atherosclerosis and LAD calcification on 04/30/2019.   Patient is here with her daughter. Patient continues to have complaints of generalized fatigue, marked dyspnea on exertion and after extensive pulmonary evaluation no etiology has been found.  Patient's quality of life has been extremely poor, unable to do even minimal activities.  No chest pain, no palpitations.  Over the past 6 months, diabetes has been uncontrolled as patient was allowed to eat any food she prefers as she was losing significant amount of weight as well.  She has gained about 5 to 6 pounds in weight since then hemoglobin A1c has worsened.  Past Medical History:  Diagnosis Date  . Depression   . Diabetes mellitus   . Hypercholesteremia   . Hypertension   . Hypothyroidism   . Oral cancer [facility]) 2012   surgery and radiation  . Tuberculosis    1978  . Weight loss, unintentional    Past Surgical History:  Procedure Laterality Date  . COLONOSCOPY    . EYE SURGERY Bilateral    cataracts  . LIPOMA EXCISION     back  . LUMBAR LAMINECTOMY/DECOMPRESSION MICRODISCECTOMY Right 04/12/2018   Procedure: Laminectomy and Foraminotomy - Lumbar four-Lumbar five - Lumbar five-Sacral one - right;  Surgeon: Eustace Moore, MD;  Location: [place];  Service: Neurosurgery;  Laterality: Right;  . LUMBAR SPINE SURGERY  05/2019  .  MASS EXCISION Left 04/02/2019   Procedure: EXCISION OF LIPOMATOUS MASS OF THE LEFT BACK;  Surgeon: Johnathan Hausen, MD;  Location: [place];  Service: General;  Laterality: Left;  . OTHER SURGICAL HISTORY     squamous cell removal;skin graft  . TOOTH EXTRACTION     all pulled   Family History  Problem Relation Age of Onset  . Arthritis Mother        Late 45s  . Heart attack Father        Deceased, 84  . Heart disease Brother   . Hypertension Other   . Hyperlipidemia Other   . Diabetes type II Sister   . Diabetes Brother     Social History   Tobacco Use  . Smoking status: Never Smoker  . Smokeless tobacco: Never Used  Substance Use Topics  . Alcohol use: No    Alcohol/week: 0.0 standard drinks   ROS  Review of Systems  Constitution: Positive for malaise/fatigue.  Cardiovascular: Positive for dyspnea on exertion. Negative for chest pain and leg swelling.  Gastrointestinal: Negative for melena.   Objective  Blood pressure (!) 134/57, pulse 86, temperature (!) 97.1 F (36.2 C), resp. rate 18, height 5' 2"  (1.575 m), weight 93 lb 12.8 oz (42.5 kg), SpO2 100 %.  Vitals with BMI 08/30/2019 05/22/2019 05/09/2019  Height 5' 2"  - 5' 2"   Weight 93 lbs 13 oz - 97 lbs 6 oz  BMI 17.15 - 17.81  Systolic 664 403 474  Diastolic 57 61 74  Pulse 86 85 90     Physical Exam  Constitutional:  She is short stature and well-nourished, appears to be no acute distress.  Cardiovascular: Normal rate, regular rhythm, normal heart sounds and intact distal pulses. Exam reveals no gallop.  No murmur heard. No leg edema, no JVD.  Pulmonary/Chest: Effort normal and breath sounds normal.  Abdominal: Soft. Bowel sounds are normal.   Laboratory examination:   Recent Labs    11/15/18 0945 03/29/19 0933 09/14/19 0837  NA 142 138 141  K 5.5* 4.8 5.9*  CL 101 105 107*  CO2 26 22 21   GLUCOSE 103* 124* 242*  BUN 17 9 11   CREATININE 0.91 0.91 0.85  CALCIUM 10.4* 9.4 9.7    GFRNONAA 59* 58* 63  GFRAA 67 >60 73   estimated creatinine clearance is 33.1 mL/min (by C-G formula based on SCr of 0.85 mg/dL).  CMP Latest Ref Rng & Units 09/14/2019 03/29/2019 11/15/2018  Glucose 65 - 99 mg/dL 242(H) 124(H) 103(H)  BUN 8 - 27 mg/dL 11 9 17   Creatinine 0.57 - 1.00 mg/dL 0.85 0.91 0.91  Sodium 134 - 144 mmol/L 141 138 142  Potassium 3.5 - 5.2 mmol/L 5.9(H) 4.8 5.5(H)  Chloride 96 - 106 mmol/L 107(H) 105 101  CO2 20 - 29 mmol/L 21 22 26   Calcium 8.7 - 10.3 mg/dL 9.7 9.4 10.4(H)  Total Protein 6.0 - 8.5 g/dL - - 7.0  Total Bilirubin 0.0 - 1.2 mg/dL - - 0.2  Alkaline Phos 39 - 117 IU/L - - 71  AST 0 - 40 IU/L - - 29  ALT 0 - 32 IU/L - - 12   CBC Latest Ref Rng & Units 09/14/2019 04/19/2019 11/15/2018  WBC 3.4 - 10.8 x10E3/uL 10.2 16.4 Repeated and verified X2.(H) 10.3  Hemoglobin 11.1 - 15.9 g/dL 11.8 11.9(L) 12.0  Hematocrit 34.0 - 46.6 % 37.4 36.6 37.5  Platelets 150 - 450 x10E3/uL 267 334.0 247   Lipid Panel  No results found for: CHOL, TRIG, HDL, CHOLHDL, VLDL, LDLCALC, LDLDIRECT HEMOGLOBIN A1C No results found for: HGBA1C, MPG TSH Recent Labs    11/15/18 0945  TSH 1.270   External labs:   Recent labs:  Cholesterol, total 330.000 03/19/2019 Triglycerides 256.000 03/19/2019 HDL 43.000 03/19/2019 LDL 236.000 03/19/2019  A1C 8.500 03/19/2019 TSH 3.130 03/19/2019  Hemoglobin 11.500 07/18/2019 INR 1.000 04/06/2018 Platelets 334.000 04/19/2019  Creatinine, Serum 0.870 MG/ 05/02/2019 Potassium 4.800 03/29/2019 Magnesium N/D ALT (SGPT) 14.000 03/19/2019  04/28/2018:  Cholesterol 171, triglycerides 155, HDL 51, LDL 87.   Hemoglobin A1c 7.9.  CBC normal.  Creatinine 0.73, EGFR 78/92, potassium 4.1, CMP normal.  TSH 3.9.  Medications and allergies   Allergies  Allergen Reactions  . Penicillins Rash    1980's  . Sulfa Antibiotics Rash     Current Outpatient Medications  Medication Instructions  . acetaminophen (TYLENOL) 650 mg, Oral, Every 6 hours PRN   . albuterol (VENTOLIN HFA) 108 (90 Base) MCG/ACT inhaler 2 puffs, Inhalation, Every 6 hours PRN  . aspirin EC 81 mg, Oral, Daily  . augmented betamethasone dipropionate (DIPROLENE-AF) 2.59 % cream 1 application, Topical, Daily PRN  . budesonide-formoterol (SYMBICORT) 80-4.5 MCG/ACT inhaler 2 puffs, Inhalation, 2 times daily  . cyanocobalamin 1,000 mcg, Oral, Daily  . diclofenac sodium (VOLTAREN) 2 g, Topical, 3 times daily PRN  . donepezil (ARICEPT) 5 mg, Oral, Daily at bedtime  . ezetimibe-simvastatin (VYTORIN) 10-20 MG per tablet 1 tablet, Oral,  Daily  . Fluticasone Propionate 1.54 % LOTN 1 application, Topical, As needed  . HYDROcodone-acetaminophen (NORCO/VICODIN) 5-325 MG tablet 1 tablet, Oral, Every 6 hours PRN  . levothyroxine (SYNTHROID) 25-50 mcg, Oral, See admin instructions, Take 28mg alternating 523m every other day.  . lidocaine (XYLOCAINE) 2 % solution 3 mLs, Mouth/Throat, As needed  . megestrol (MEGACE) 400 mg, Oral, Daily  . sertraline (ZOLOFT) 100 mg, Oral, Daily  . sitaGLIPtin (JANUVIA) 25 mg, Oral, Daily  . Vitamin D-3 1,000 mg, Oral, Daily   Radiology:   No results found.  Cardiac Studies:   Echocardiogram 10/26/2018 :  Normal LV systolic function with EF 65%. Left ventricle cavity is normal in size. Normal left ventricular wall thickness. Normal global wall motion. Doppler evidence of grade II (pseudonormal) diastolic dysfunction, elevated LAP. Calculated EF 65%. Left atrial cavity is mildly dilated with LA volume of 45 ml.  Normal appearance of the mitral valve. Mild to moderate mitral regurgitation.  Lexiscan myoview stress test 08/07/2018:  1. Lexiscan stress test was performed. Exercise capacity was not assessed. Stress symptoms included dizziness and headache. Resting blood pressure was 112/60 mmHg and peak effect blood pressure was 120/62 mmHg. The resting and stress electrocardiogram demonstrated normal sinus rhythm, normal conduction, no arrhythmias and  normal repolarization. Stress EKG is non diagnostic for ischemia as it is a pharmacologic stress.  2. The overall quality of the study is good. There is no evidence of abnormal lung activity. Stress and rest SPECT images demonstrate homogeneous tracer distribution throughout the myocardium. Gated SPECT imaging reveals normal myocardial thickening and wall motion. The left ventricular ejection fraction was normal (62%).   3. Low risk study.  EKG  EKG 08/30/2019: Normal sinus rhythm with rate of 79 bpm, borderline criteria for left atrial enlargement, normal axis.  Poor R wave progression, probably normal variant, cannot exclude anteroseptal infarct old.  No evidence of ischemia, normal QT interval.      Assessment     ICD-10-CM   1. Shortness of breath  R06.02 EKG 12-Lead    Pulse oximetry, overnight    Brain natriuretic peptide  2. Coronary artery calcification seen on CAT scan  I25.10   3. Aortic atherosclerosis (HCC)  I70.0   4. Type 2 diabetes mellitus without complication, without long-term current use of insulin (HCC)  E1M08.6asic metabolic panel    CBC  5. Hyperkalemia  E87.5 Potassium     No orders of the defined types were placed in this encounter.   Medications Discontinued During This Encounter  Medication Reason  . methylPREDNISolone (MEDROL DOSEPAK) 4 MG TBPK tablet Completed Course    Recommendations:   Hannah REMSENis a [age].o. [place] with mild dementia, h/o oral cancer in remission, type 2 DM, depression, with worsening exertional dyspnea and fatigue symptoms.  Cardiac workup showed echocardiogram with normal LF, grade II diastolic dysfunction, no ischemia/infarct on stress test. High resolution CT had revealed no pulmonary abnormality (H/O TB), but aortic atherosclerosis and LAD calcification on 04/30/2019.  Extensive pulmonary evaluation has been unyielding including PFTs, 6-minute walk test.  Patient's diabetes has been uncontrolled recently,  trying to gain weight she is allowed to eat any food she prefers, mostly eating carbohydrate diet.  Also her triglycerides have increased since then over the past 6 months as she is losing weight, she has now gained about 6 pounds in weight.  Due to continued symptoms of dyspnea, fatigue, will perform nocturnal oximetry, I will also set her  up for right and left heart catheterization.  I will make further recommendation after the procedure.  Patient is malnourished, very thin and frail, high risk for bleeding complication discussed with patient's daughter.  Patient's daughter states that her quality of life is so poor that she would accept any risks.  Addendum 09/15/19 K high and will recheck and also check BNP. Suspect diabetes related RTA  Adrian Prows, MD, [facility] 09/15/2019, 3:56 PM [facility]. [place] Office: [phone]

## 2019-09-07 ENCOUNTER — Other Ambulatory Visit (HOSPITAL_COMMUNITY): Payer: Self-pay

## 2019-09-14 ENCOUNTER — Other Ambulatory Visit (HOSPITAL_COMMUNITY)
Admission: RE | Admit: 2019-09-14 | Discharge: 2019-09-14 | Disposition: A | Discharge status: Home / Self Care | Payer: Medicare Other | Source: Ambulatory Visit | Attending: Cardiology | Admitting: Cardiology

## 2019-09-14 DIAGNOSIS — Z20822 Contact with and (suspected) exposure to covid-19: Secondary | ICD-10-CM | POA: Insufficient documentation

## 2019-09-14 DIAGNOSIS — Z01812 Encounter for preprocedural laboratory examination: Secondary | ICD-10-CM | POA: Insufficient documentation

## 2019-09-14 LAB — SARS CORONAVIRUS 2 (TAT 6-24 HRS): SARS Coronavirus 2: NEGATIVE

## 2019-09-15 HISTORY — PX: AORTIC ARCH ANGIOGRAPHY: CATH118224

## 2019-09-15 LAB — CBC
Hematocrit: 37.4 % (ref 34.0–46.6)
Hemoglobin: 11.8 g/dL (ref 11.1–15.9)
MCH: 27.8 pg (ref 26.6–33.0)
MCHC: 31.6 g/dL (ref 31.5–35.7)
MCV: 88 fL (ref 79–97)
Platelets: 267 10*3/uL (ref 150–450)
RBC: 4.24 x10E6/uL (ref 3.77–5.28)
RDW: 13.2 % (ref 11.7–15.4)
WBC: 10.2 10*3/uL (ref 3.4–10.8)

## 2019-09-15 LAB — BASIC METABOLIC PANEL
BUN/Creatinine Ratio: 13 (ref 12–28)
BUN: 11 mg/dL (ref 8–27)
CO2: 21 mmol/L (ref 20–29)
Calcium: 9.7 mg/dL (ref 8.7–10.3)
Chloride: 107 mmol/L — ABNORMAL HIGH (ref 96–106)
Creatinine, Ser: 0.85 mg/dL (ref 0.57–1.00)
GFR calc Af Amer: 73 mL/min/{1.73_m2} (ref >59)
GFR calc non Af Amer: 63 mL/min/{1.73_m2} (ref >59)
Glucose: 242 mg/dL — ABNORMAL HIGH (ref 65–99)
Potassium: 5.9 mmol/L — ABNORMAL HIGH (ref 3.5–5.2)
Sodium: 141 mmol/L (ref 134–144)

## 2019-09-15 NOTE — Progress Notes (Signed)
Potassium levels very high. I have placed orders in [facility] to recheck potassium levels. No fasting needed. Please have it done.

## 2019-09-15 NOTE — Addendum Note (Signed)
Addended by: Kela Millin on: 09/15/2019 03:57 PM   Modules accepted: Orders

## 2019-09-15 NOTE — Progress Notes (Signed)
Potassium level is very high. I have placed orders to recheck K and need to get it done Monday or Tuesday.

## 2019-09-18 ENCOUNTER — Ambulatory Visit (HOSPITAL_COMMUNITY)
Admission: RE | Admit: 2019-09-18 | Discharge: 2019-09-18 | Disposition: A | Discharge status: Home / Self Care | Payer: Medicare Other | Attending: Cardiology | Admitting: Cardiology

## 2019-09-18 ENCOUNTER — Encounter (HOSPITAL_COMMUNITY)
Admission: RE | Disposition: A | Discharge status: Home / Self Care | Payer: Self-pay | Source: Home / Self Care | Attending: Cardiology

## 2019-09-18 ENCOUNTER — Other Ambulatory Visit: Payer: Self-pay

## 2019-09-18 DIAGNOSIS — R079 Chest pain, unspecified: Secondary | ICD-10-CM | POA: Diagnosis present

## 2019-09-18 DIAGNOSIS — R0609 Other forms of dyspnea: Secondary | ICD-10-CM | POA: Diagnosis not present

## 2019-09-18 DIAGNOSIS — E7800 Pure hypercholesterolemia, unspecified: Secondary | ICD-10-CM | POA: Insufficient documentation

## 2019-09-18 DIAGNOSIS — F329 Major depressive disorder, single episode, unspecified: Secondary | ICD-10-CM | POA: Insufficient documentation

## 2019-09-18 DIAGNOSIS — E039 Hypothyroidism, unspecified: Secondary | ICD-10-CM | POA: Diagnosis not present

## 2019-09-18 DIAGNOSIS — Z7951 Long term (current) use of inhaled steroids: Secondary | ICD-10-CM | POA: Diagnosis not present

## 2019-09-18 DIAGNOSIS — Z79890 Hormone replacement therapy (postmenopausal): Secondary | ICD-10-CM | POA: Insufficient documentation

## 2019-09-18 DIAGNOSIS — Z79899 Other long term (current) drug therapy: Secondary | ICD-10-CM | POA: Insufficient documentation

## 2019-09-18 DIAGNOSIS — F0390 Unspecified dementia without behavioral disturbance: Secondary | ICD-10-CM | POA: Insufficient documentation

## 2019-09-18 DIAGNOSIS — I10 Essential (primary) hypertension: Secondary | ICD-10-CM | POA: Insufficient documentation

## 2019-09-18 DIAGNOSIS — I700 Atherosclerosis of aorta: Secondary | ICD-10-CM | POA: Diagnosis not present

## 2019-09-18 DIAGNOSIS — E875 Hyperkalemia: Secondary | ICD-10-CM | POA: Diagnosis not present

## 2019-09-18 DIAGNOSIS — E1136 Type 2 diabetes mellitus with diabetic cataract: Secondary | ICD-10-CM | POA: Diagnosis not present

## 2019-09-18 HISTORY — PX: RIGHT/LEFT HEART CATH AND CORONARY ANGIOGRAPHY: CATH118266

## 2019-09-18 LAB — POCT I-STAT EG7
Acid-base deficit: 2 mmol/L (ref 0.0–2.0)
Acid-base deficit: 3 mmol/L — ABNORMAL HIGH (ref 0.0–2.0)
Bicarbonate: 23.3 mmol/L (ref 20.0–28.0)
Bicarbonate: 22.7 mmol/L (ref 20.0–28.0)
Calcium, Ion: 1.22 mmol/L (ref 1.15–1.40)
Calcium, Ion: 1.23 mmol/L (ref 1.15–1.40)
HCT: 32 % — ABNORMAL LOW (ref 36.0–46.0)
HCT: 32 % — ABNORMAL LOW (ref 36.0–46.0)
Hemoglobin: 10.9 g/dL — ABNORMAL LOW (ref 12.0–15.0)
Hemoglobin: 10.9 g/dL — ABNORMAL LOW (ref 12.0–15.0)
O2 Saturation: 67 %
O2 Saturation: 68 %
Potassium: 3.9 mmol/L (ref 3.5–5.1)
Potassium: 3.8 mmol/L (ref 3.5–5.1)
Sodium: 140 mmol/L (ref 135–145)
Sodium: 140 mmol/L (ref 135–145)
TCO2: 25 mmol/L (ref 22–32)
TCO2: 24 mmol/L (ref 22–32)
pCO2, Ven: 41.8 mmHg — ABNORMAL LOW (ref 44.0–60.0)
pCO2, Ven: 40.9 mmHg — ABNORMAL LOW (ref 44.0–60.0)
pH, Ven: 7.353 (ref 7.250–7.430)
pH, Ven: 7.351 (ref 7.250–7.430)
pO2, Ven: 37 mmHg (ref 32.0–45.0)
pO2, Ven: 37 mmHg (ref 32.0–45.0)

## 2019-09-18 LAB — POCT I-STAT 7, (LYTES, BLD GAS, ICA,H+H)
Acid-base deficit: 1 mmol/L (ref 0.0–2.0)
Acid-base deficit: 3 mmol/L — ABNORMAL HIGH (ref 0.0–2.0)
Bicarbonate: 23.6 mmol/L (ref 20.0–28.0)
Bicarbonate: 21.9 mmol/L (ref 20.0–28.0)
Calcium, Ion: 1.24 mmol/L (ref 1.15–1.40)
Calcium, Ion: 1.25 mmol/L (ref 1.15–1.40)
HCT: 32 % — ABNORMAL LOW (ref 36.0–46.0)
HCT: 32 % — ABNORMAL LOW (ref 36.0–46.0)
Hemoglobin: 10.9 g/dL — ABNORMAL LOW (ref 12.0–15.0)
Hemoglobin: 10.9 g/dL — ABNORMAL LOW (ref 12.0–15.0)
O2 Saturation: 100 %
O2 Saturation: 100 %
Potassium: 3.8 mmol/L (ref 3.5–5.1)
Potassium: 3.8 mmol/L (ref 3.5–5.1)
Sodium: 140 mmol/L (ref 135–145)
Sodium: 140 mmol/L (ref 135–145)
TCO2: 25 mmol/L (ref 22–32)
TCO2: 23 mmol/L (ref 22–32)
pCO2 arterial: 40.3 mmHg (ref 32.0–48.0)
pCO2 arterial: 37.6 mmHg (ref 32.0–48.0)
pH, Arterial: 7.376 (ref 7.350–7.450)
pH, Arterial: 7.373 (ref 7.350–7.450)
pO2, Arterial: 225 mmHg — ABNORMAL HIGH (ref 83.0–108.0)
pO2, Arterial: 226 mmHg — ABNORMAL HIGH (ref 83.0–108.0)

## 2019-09-18 LAB — GLUCOSE, CAPILLARY
Glucose-Capillary: 251 mg/dL — ABNORMAL HIGH (ref 70–99)
Glucose-Capillary: 213 mg/dL — ABNORMAL HIGH (ref 70–99)

## 2019-09-18 LAB — POTASSIUM: Potassium: 4.1 mmol/L (ref 3.5–5.1)

## 2019-09-18 SURGERY — RIGHT/LEFT HEART CATH AND CORONARY ANGIOGRAPHY
Anesthesia: LOCAL

## 2019-09-18 MED ORDER — ONDANSETRON HCL 4 MG/2ML IJ SOLN: 4.0000 mg | Freq: Four times a day (QID) | INTRAMUSCULAR | Status: DC | PRN

## 2019-09-18 MED ORDER — FENTANYL CITRATE (PF) 100 MCG/2ML IJ SOLN
INTRAMUSCULAR | Status: DC | PRN
  Administered 2019-09-18: 25 ug via INTRAVENOUS

## 2019-09-18 MED ORDER — MIDAZOLAM HCL 2 MG/2ML IJ SOLN
INTRAMUSCULAR | Status: DC | PRN
  Administered 2019-09-18: 1 mg via INTRAVENOUS

## 2019-09-18 MED ORDER — ASPIRIN 81 MG PO CHEW
81.0000 mg | CHEWABLE_TABLET | ORAL | Status: AC
  Administered 2019-09-18: 81 mg via ORAL
  Filled 2019-09-18: qty 1

## 2019-09-18 MED ORDER — SODIUM CHLORIDE 0.9 % IV SOLN: 250.0000 mL | INTRAVENOUS | Status: DC | PRN

## 2019-09-18 MED ORDER — VERAPAMIL HCL 2.5 MG/ML IV SOLN
INTRAVENOUS | Status: DC | PRN
  Administered 2019-09-18: 10 mL via INTRA_ARTERIAL

## 2019-09-18 MED ORDER — SODIUM CHLORIDE 0.9 % WEIGHT BASED INFUSION: 1.0000 mL/kg/h | INTRAVENOUS | Status: DC

## 2019-09-18 MED ORDER — LIDOCAINE HCL (PF) 1 % IJ SOLN
INTRAMUSCULAR | Status: AC
  Filled 2019-09-18: qty 30

## 2019-09-18 MED ORDER — HEPARIN (PORCINE) IN NACL 1000-0.9 UT/500ML-% IV SOLN
INTRAVENOUS | Status: AC
  Filled 2019-09-18: qty 1000

## 2019-09-18 MED ORDER — CHLORTHALIDONE 25 MG PO TABS: 25.0000 mg | ORAL_TABLET | ORAL | 2 refills | Status: DC

## 2019-09-18 MED ORDER — HEPARIN (PORCINE) IN NACL 1000-0.9 UT/500ML-% IV SOLN
INTRAVENOUS | Status: DC | PRN
  Administered 2019-09-18 (×2): 500 mL

## 2019-09-18 MED ORDER — SODIUM CHLORIDE 0.9% FLUSH: 3.0000 mL | INTRAVENOUS | Status: DC | PRN

## 2019-09-18 MED ORDER — HEPARIN SODIUM (PORCINE) 1000 UNIT/ML IJ SOLN
INTRAMUSCULAR | Status: DC | PRN
  Administered 2019-09-18: 2000 [IU] via INTRAVENOUS

## 2019-09-18 MED ORDER — IOHEXOL 350 MG/ML SOLN
INTRAVENOUS | Status: DC | PRN
  Administered 2019-09-18: 20 mL

## 2019-09-18 MED ORDER — SODIUM CHLORIDE 0.9 % WEIGHT BASED INFUSION
3.0000 mL/kg/h | INTRAVENOUS | Status: AC
  Administered 2019-09-18: 3 mL/kg/h via INTRAVENOUS

## 2019-09-18 MED ORDER — SODIUM CHLORIDE 0.9% FLUSH: 3.0000 mL | Freq: Two times a day (BID) | INTRAVENOUS | Status: DC

## 2019-09-18 MED ORDER — HYDRALAZINE HCL 20 MG/ML IJ SOLN: 10.0000 mg | INTRAMUSCULAR | Status: DC | PRN

## 2019-09-18 MED ORDER — HYDRALAZINE HCL 20 MG/ML IJ SOLN
INTRAMUSCULAR | Status: AC
  Filled 2019-09-18: qty 1

## 2019-09-18 MED ORDER — FENTANYL CITRATE (PF) 100 MCG/2ML IJ SOLN
INTRAMUSCULAR | Status: AC
  Filled 2019-09-18: qty 2

## 2019-09-18 MED ORDER — ACETAMINOPHEN 325 MG PO TABS: 650.0000 mg | ORAL_TABLET | ORAL | Status: DC | PRN

## 2019-09-18 MED ORDER — LABETALOL HCL 5 MG/ML IV SOLN: 10.0000 mg | INTRAVENOUS | Status: DC | PRN

## 2019-09-18 MED ORDER — HYDRALAZINE HCL 20 MG/ML IJ SOLN
INTRAMUSCULAR | Status: DC | PRN
  Administered 2019-09-18: 10 mg via INTRAVENOUS

## 2019-09-18 MED ORDER — LIDOCAINE HCL (PF) 1 % IJ SOLN
INTRAMUSCULAR | Status: DC | PRN
  Administered 2019-09-18 (×2): 2 mL

## 2019-09-18 MED ORDER — VERAPAMIL HCL 2.5 MG/ML IV SOLN
INTRAVENOUS | Status: AC
  Filled 2019-09-18: qty 2

## 2019-09-18 MED ORDER — HEPARIN SODIUM (PORCINE) 1000 UNIT/ML IJ SOLN
INTRAMUSCULAR | Status: AC
  Filled 2019-09-18: qty 1

## 2019-09-18 MED ORDER — MIDAZOLAM HCL 2 MG/2ML IJ SOLN
INTRAMUSCULAR | Status: AC
  Filled 2019-09-18: qty 2

## 2019-09-18 MED ORDER — SODIUM CHLORIDE 0.9 % WEIGHT BASED INFUSION
1.0000 mL/kg/h | INTRAVENOUS | Status: DC
  Administered 2019-09-18: 500 mL via INTRAVENOUS

## 2019-09-18 SURGICAL SUPPLY — 11 items
CATH BALLN WEDGE 5F 110CM (CATHETERS) ×2 IMPLANT
CATH OPTITORQUE TIG 4.0 5F (CATHETERS) ×2 IMPLANT
DEVICE RAD TR BAND REGULAR (VASCULAR PRODUCTS) ×2 IMPLANT
GLIDESHEATH SLEND A-KIT 6F 22G (SHEATH) IMPLANT
GUIDEWIRE INQWIRE 1.5J.035X260 (WIRE) ×1 IMPLANT
INQWIRE 1.5J .035X260CM (WIRE) ×2
KIT HEART LEFT (KITS) ×2 IMPLANT
PACK CARDIAC CATHETERIZATION (CUSTOM PROCEDURE TRAY) ×2 IMPLANT
SHEATH GLIDE SLENDER 4/5FR (SHEATH) ×4 IMPLANT
TRANSDUCER W/STOPCOCK (MISCELLANEOUS) ×2 IMPLANT
TUBING CIL FLEX 10 FLL-RA (TUBING) ×2 IMPLANT

## 2019-09-18 NOTE — Discharge Instructions (Signed)
Radial Site Care  This sheet gives you information about how to care for yourself after your procedure. Your health care provider may also give you more specific instructions. If you have problems or questions, contact your health care provider. What can I expect after the procedure? After the procedure, it is common to have:  Bruising and tenderness at the catheter insertion area. Follow these instructions at home: Medicines  Take over-the-counter and prescription medicines only as told by your health care provider. Insertion site care  Follow instructions from your health care provider about how to take care of your insertion site. Make sure you: ? Wash your hands with soap and water before you change your bandage (dressing). If soap and water are not available, use hand sanitizer. ? Change your dressing as told by your health care provider. ? Leave stitches (sutures), skin glue, or adhesive strips in place. These skin closures may need to stay in place for 2 weeks or longer. If adhesive strip edges start to loosen and curl up, you may trim the loose edges. Do not remove adhesive strips completely unless your health care provider tells you to do that.  Check your insertion site every day for signs of infection. Check for: ? Redness, swelling, or pain. ? Fluid or blood. ? Pus or a bad smell. ? Warmth.  Do not take baths, swim, or use a hot tub until your health care provider approves.  You may shower 24-48 hours after the procedure, or as directed by your health care provider. ? Remove the dressing and gently wash the site with plain soap and water. ? Pat the area dry with a clean towel. ? Do not rub the site. That could cause bleeding.  Do not apply powder or lotion to the site. Activity   For 24 hours after the procedure, or as directed by your health care provider: ? Do not flex or bend the affected arm. ? Do not push or pull heavy objects with the affected arm. ? Do not  drive yourself home from the hospital or clinic. You may drive 24 hours after the procedure unless your health care provider tells you not to. ? Do not operate machinery or power tools.  Do not lift anything that is heavier than 10 lb (4.5 kg), or the limit that you are told, until your health care provider says that it is safe.  Ask your health care provider when it is okay to: ? Return to work or school. ? Resume usual physical activities or sports. ? Resume sexual activity. General instructions  If the catheter site starts to bleed, raise your arm and put firm pressure on the site. If the bleeding does not stop, get help right away. This is a medical emergency.  If you went home on the same day as your procedure, a responsible adult should be with you for the first 24 hours after you arrive home.  Keep all follow-up visits as told by your health care provider. This is important. Contact a health care provider if:  You have a fever.  You have redness, swelling, or yellow drainage around your insertion site. Get help right away if:  You have unusual pain at the radial site.  The catheter insertion area swells very fast.  The insertion area is bleeding, and the bleeding does not stop when you hold steady pressure on the area.  Your arm or hand becomes pale, cool, tingly, or numb. These symptoms may represent a serious problem   that is an emergency. Do not wait to see if the symptoms will go away. Get medical help right away. Call your local emergency services (911 in the [place] Do not drive yourself to the hospital. Summary  After the procedure, it is common to have bruising and tenderness at the site.  Follow instructions from your health care provider about how to take care of your radial site wound. Check the wound every day for signs of infection.  Do not lift anything that is heavier than 10 lb (4.5 kg), or the limit that you are told, until your health care provider says  that it is safe. This information is not intended to replace advice given to you by your health care provider. Make sure you discuss any questions you have with your health care provider. Document Revised: 06/08/2017 Document Reviewed: 06/08/2017 Elsevier Patient Education  2020 [employer]  

## 2019-09-18 NOTE — Interval H&P Note (Signed)
History and Physical Interval Note:  09/18/2019 8:02 AM  [name]  has presented today for surgery, with the diagnosis of Chest pain.  The various methods of treatment have been discussed with the patient and family. After consideration of risks, benefits and other options for treatment, the patient has consented to  Procedure(s): RIGHT/LEFT HEART CATH AND CORONARY ANGIOGRAPHY (N/A) and possible intervention as a surgical intervention.  The patient's history has been reviewed, patient examined, no change in status, stable for surgery.  I have reviewed the patient's chart and labs.  Questions were answered to the patient's satisfaction.   Cath Lab Visit (complete for each Cath Lab visit)  Clinical Evaluation Leading to the Procedure:   ACS: No.  Non-ACS:    Anginal Classification: CCS III  Anti-ischemic medical therapy: No Therapy  Non-Invasive Test Results: No non-invasive testing performed  Prior CABG: No previous CABG  Adrian Prows

## 2019-09-29 NOTE — Progress Notes (Signed)
Primary Physician/Referring:  Donald Prose, MD  Patient ID: Hannah [name], female    DOB: Sep 24, 1932, 86 y.o.   MRN: [id number]  Chief Complaint  Patient presents with  . Follow-up    7-10 day  . aortic catheter  . Hypertension  . Shortness of Breath   HPI:    Hannah [name]  is a 86 y.o.  [place] female with mild dementia, h/o oral cancer in remission, type 2 DM, depression, with worsening exertional dyspnea and fatigue symptoms.  Cardiac workup showed echocardiogram with normal LF, grade II diastolic dysfunction, no ischemia/infarct on stress test. High resolution CT had revealed no pulmonary abnormality (H/O TB), but aortic atherosclerosis and LAD calcification on 04/30/2019.  She now presents here for follow-up of dyspnea on exertion.  No new symptomatology.  No PND or orthopnea.  She underwent right and left heart catheterization on 09/18/2019 and found to have moderately elevated LVEDP and normal coronary arteries and normal right heart pressures.  I had started her on amlodipine for diastolic dysfunction which is tolerating.  Daughter present at the bedside.  No complication from the procedure.   Past Medical History:  Diagnosis Date  . Depression   . Diabetes mellitus   . Hypercholesteremia   . Hypertension   . Hypothyroidism   . Oral cancer [facility]) 2012   surgery and radiation  . Tuberculosis    1978  . Weight loss, unintentional    Past Surgical History:  Procedure Laterality Date  . AORTIC ARCH ANGIOGRAPHY  09/2019  . COLONOSCOPY    . EYE SURGERY Bilateral    cataracts  . LIPOMA EXCISION     back  . LUMBAR LAMINECTOMY/DECOMPRESSION MICRODISCECTOMY Right 04/12/2018   Procedure: Laminectomy and Foraminotomy - Lumbar four-Lumbar five - Lumbar five-Sacral one - right;  Surgeon: Eustace Moore, MD;  Location: [place];  Service: Neurosurgery;  Laterality: Right;  . LUMBAR SPINE SURGERY  05/2019  . MASS EXCISION Left 04/02/2019   Procedure: EXCISION OF  LIPOMATOUS MASS OF THE LEFT BACK;  Surgeon: Johnathan Hausen, MD;  Location: [place];  Service: General;  Laterality: Left;  . OTHER SURGICAL HISTORY     squamous cell removal;skin graft  . RIGHT/LEFT HEART CATH AND CORONARY ANGIOGRAPHY N/A 09/18/2019   Procedure: RIGHT/LEFT HEART CATH AND CORONARY ANGIOGRAPHY;  Surgeon: Adrian Prows, MD;  Location: [facility] [facility];  Service: Cardiovascular;  Laterality: N/A;  . TOOTH EXTRACTION     all pulled   Family History  Problem Relation Age of Onset  . Arthritis Mother        Late 70s  . Heart attack Father        Deceased, 108  . Heart disease Brother   . Hypertension Other   . Hyperlipidemia Other   . Diabetes type II Sister   . Diabetes Brother     Social History   Tobacco Use  . Smoking status: Never Smoker  . Smokeless tobacco: Never Used  Substance Use Topics  . Alcohol use: No    Alcohol/week: 0.0 standard drinks   Marital Status: Widowed ROS  Review of Systems  Constitution: Positive for malaise/fatigue.  Cardiovascular: Positive for dyspnea on exertion. Negative for chest pain and leg swelling.  Gastrointestinal: Negative for melena.   Objective  Blood pressure (!) 157/69, pulse 89, temperature (!) 96.5 F (35.8 C), temperature source Temporal, resp. rate 16, height 5' 2"  (1.575 m), weight 93 lb (42.2 kg), SpO2 100 %.  Vitals with BMI 10/01/2019 09/18/2019 09/18/2019  Height 5' 2"  - -  Weight 93 lbs - -  BMI 29.56 - -  Systolic 213 086 578  Diastolic 69 54 61  Pulse 89 - 83     Physical Exam  Constitutional:  She is short stature and well-nourished, appears to be no acute distress.  Cardiovascular: Normal rate, regular rhythm, normal heart sounds and intact distal pulses. Exam reveals no gallop.  No murmur heard. No leg edema, no JVD.  Pulmonary/Chest: Effort normal and breath sounds normal.  Abdominal: Soft. Bowel sounds are normal.   Laboratory examination:   Recent Labs    11/15/18 0945  11/15/18 0945 03/29/19 0933 09/14/19 0837 09/14/19 0837 09/18/19 0656 09/18/19 0853 09/18/19 0859  NA 142   < > 138 141  --   --  140  140 140  140  K 5.5*   < > 4.8 5.9*   < > 4.1 3.8  3.9 3.8  3.8  CL 101  --  105 107*  --   --   --   --   CO2 26  --  22 21  --   --   --   --   GLUCOSE 103*  --  124* 242*  --   --   --   --   BUN 17  --  9 11  --   --   --   --   CREATININE 0.91  --  0.91 0.85  --   --   --   --   CALCIUM 10.4*  --  9.4 9.7  --   --   --   --   GFRNONAA 59*  --  58* 63  --   --   --   --   GFRAA 67  --  >60 73  --   --   --   --    < > = values in this interval not displayed.   estimated creatinine clearance is 32.8 mL/min (by C-G formula based on SCr of 0.85 mg/dL).  CMP Latest Ref Rng & Units 09/18/2019 09/18/2019 09/18/2019  Glucose 65 - 99 mg/dL - - -  BUN 8 - 27 mg/dL - - -  Creatinine 0.57 - 1.00 mg/dL - - -  Sodium 135 - 145 mmol/L 140 140 140  Potassium 3.5 - 5.1 mmol/L 3.8 3.8 3.8  Chloride 96 - 106 mmol/L - - -  CO2 20 - 29 mmol/L - - -  Calcium 8.7 - 10.3 mg/dL - - -  Total Protein 6.0 - 8.5 g/dL - - -  Total Bilirubin 0.0 - 1.2 mg/dL - - -  Alkaline Phos 39 - 117 IU/L - - -  AST 0 - 40 IU/L - - -  ALT 0 - 32 IU/L - - -   CBC Latest Ref Rng & Units 09/18/2019 09/18/2019 09/18/2019  WBC 3.4 - 10.8 x10E3/uL - - -  Hemoglobin 12.0 - 15.0 g/dL 10.9(L) 10.9(L) 10.9(L)  Hematocrit 36.0 - 46.0 % 32.0(L) 32.0(L) 32.0(L)  Platelets 150 - 450 x10E3/uL - - -   Lipid Panel  No results found for: CHOL, TRIG, HDL, CHOLHDL, VLDL, LDLCALC, LDLDIRECT HEMOGLOBIN A1C No results found for: HGBA1C, MPG TSH Recent Labs    11/15/18 0945  TSH 1.270   External labs:   09/14/2019: eGFR 63, Glucose 242, Creatinine 0.85, BUN 11. Platelets 267, RBC 4.2.  Cholesterol, total 330.000 03/19/2019 Triglycerides 256.000 03/19/2019 HDL 43.000 03/19/2019 LDL  236.000 03/19/2019  A1C 8.500 03/19/2019 TSH 3.130 03/19/2019  Hemoglobin 11.500 07/18/2019 INR 1.000  04/06/2018 Platelets 334.000 04/19/2019  Creatinine, Serum 0.870 MG/ 05/02/2019 Potassium 4.800 03/29/2019 Magnesium N/D ALT (SGPT) 14.000 03/19/2019  04/28/2018:  Cholesterol 171, triglycerides 155, HDL 51, LDL 87.   Hemoglobin A1c 7.9.  CBC normal.  Creatinine 0.73, EGFR 78/92, potassium 4.1, CMP normal.  TSH 3.9.  Medications and allergies   Allergies  Allergen Reactions  . Penicillins Rash    1980's  . Sulfa Antibiotics Rash     Current Outpatient Medications  Medication Instructions  . acetaminophen (TYLENOL) 650 mg, Oral, Every 6 hours PRN  . albuterol (VENTOLIN HFA) 108 (90 Base) MCG/ACT inhaler 2 puffs, Inhalation, Every 6 hours PRN  . amLODipine (NORVASC) 5 mg, Oral, Daily  . augmented betamethasone dipropionate (DIPROLENE-AF) 9.82 % cream 1 application, Topical, Daily PRN  . budesonide-formoterol (SYMBICORT) 80-4.5 MCG/ACT inhaler 2 puffs, Inhalation, 2 times daily  . chlorthalidone (HYGROTON) 25 mg, Oral, BH-each morning  . cyanocobalamin 1,000 mcg, Oral, Daily  . diclofenac sodium (VOLTAREN) 2 g, Topical, 3 times daily PRN  . donepezil (ARICEPT) 5 mg, Oral, Daily at bedtime  . ezetimibe-simvastatin (VYTORIN) 10-20 MG per tablet 1 tablet, Oral, Daily  . Fluticasone Propionate 6.41 % LOTN 1 application, Topical, As needed  . HYDROcodone-acetaminophen (NORCO/VICODIN) 5-325 MG tablet 1 tablet, Oral, Every 6 hours PRN  . levothyroxine (SYNTHROID) 25-50 mcg, Oral, See admin instructions, Take 51mg alternating 549m every other day.  . lidocaine (XYLOCAINE) 2 % solution 3 mLs, Mouth/Throat, As needed  . megestrol (MEGACE) 400 mg, Oral, Daily  . sertraline (ZOLOFT) 100 mg, Oral, Daily  . sitaGLIPtin (JANUVIA) 25 mg, Oral, Daily  . Vitamin D-3 1,000 mg, Oral, Daily   Radiology:   No results found.  Cardiac Studies:   Lexiscan myoview stress test 08/07/2018:  1. Lexiscan stress test was performed. Exercise capacity was not assessed. Stress symptoms included  dizziness and headache. Resting blood pressure was 112/60 mmHg and peak effect blood pressure was 120/62 mmHg. The resting and stress electrocardiogram demonstrated normal sinus rhythm, normal conduction, no arrhythmias and normal repolarization. Stress EKG is non diagnostic for ischemia as it is a pharmacologic stress.  2. The overall quality of the study is good. There is no evidence of abnormal lung activity. Stress and rest SPECT images demonstrate homogeneous tracer distribution throughout the myocardium. Gated SPECT imaging reveals normal myocardial thickening and wall motion. The left ventricular ejection fraction was normal (62%).   3. Low risk study.  Echocardiogram 10/26/2018 :  Normal LV systolic function with EF 65%. Left ventricle cavity is normal in size. Normal left ventricular wall thickness. Normal global wall motion. Doppler evidence of grade II (pseudonormal) diastolic dysfunction, elevated LAP. Calculated EF 65%. Left atrial cavity is mildly dilated with LA volume of 45 ml.  Normal appearance of the mitral valve. Mild to moderate mitral regurgitation.  Left and Right Heart Catheterization 09/18/19:  LVEDP moderately elevated, 20 mmHg.  LV gram not performed to conserve contrast. Normal coronary arteries right dominant circulation.  Small coronary arteries with brisk flow without evidence of atherosclerosis. Normal right heart catheterization.  RA 6/5, mean 4; RV 22/-1, EDP 2; PA 23/1, mean 13, PA saturation 68%.;  PW 8/10, mean 8 mmHg.  Aortic saturation 1/2%. Cardiac output 3.57, cardiac index 2.59, QP/QS 1.0, normal.  Impression: Dyspnea on exertion is related to diastolic dysfunction with elevated LVEDP.  We will start the patient on gentle diuretics.  She would  benefit from being on ACE inhibitor's however has underlying hyperkalemia.  We will try to control her blood pressure aggressively, amlodipine will be another choice.  She will be discharged home with outpatient  follow-up, 20 mL contrast utilized.  EKG:  EKG 10/01/2019: Normal sinus rhythm with rate of 81 beats minute, borderline criteria for left renal enlargement, normal EKG.    08/30/2019: Normal sinus rhythm with rate of 79 bpm, borderline criteria for left atrial enlargement, normal axis.  Poor R wave progression, probably normal variant, cannot exclude anteroseptal infarct old.  No evidence of ischemia, normal QT interval.      Assessment     ICD-10-CM   1. Shortness of breath  R06.02 EKG 12-Lead   related to diastolic dysfunction with elevated LVEDP  2. Mixed hyperlipidemia  E78.2   3. Primary hypertension  I10 amLODipine (NORVASC) 5 MG tablet     Meds ordered this encounter  Medications  . amLODipine (NORVASC) 5 MG tablet    Sig: Take 1 tablet (5 mg total) by mouth daily.    Dispense:  30 tablet    Refill:  2    Medications Discontinued During This Encounter  Medication Reason  . amLODipine (NORVASC) 5 MG tablet Reorder    Recommendations:   SUMIYA MAMARIL  is a 86 y.o. [place] female with mild dementia, h/o oral cancer in remission, type 2 DM, depression, with worsening exertional dyspnea and fatigue symptoms.  Cardiac workup showed echocardiogram with normal LF, grade II diastolic dysfunction, no ischemia/infarct on stress test. High resolution CT had revealed no pulmonary abnormality (H/O TB), but aortic atherosclerosis and LAD calcification on 04/30/2019.  Extensive pulmonary evaluation has been unyielding including PFTs, 6-minute walk test.  Patient symptoms are clearly related to deconditioning, she also has spinal stenosis and left leg sciatica that is also limiting her and she is in constant pain.  Is also elevated, hypertension may also be contributing to her dyspnea.  I refilled her amlodipine that was started in the hospital after cardiac catheterization.  I discussed with the daughter that we need to control the pain so she can increase her physical activity.   She may benefit from physical therapy and evaluation for left leg sciatica.  Stable from cardiac standpoint.  She may benefit from pulmonary rehab.  She has marked hyperlipidemia, I suspect she probably has heterogeneous familial hyperlipidemia in view of marked elevated total cholesterol and also LDL.  In spite of this she is on moderate doses of high intensity statin including Vytorin 10/20 mg in the evening, would not aggressively pursue this as coronary angiogram does not reveal any significant abnormality although radiographic findings of coronary and aortic atherosclerosis is probably age appropriate.  Also she is presently 86 years of age.  I will see her back on a as needed basis.  Adrian Prows, MD, [facility] 10/01/2019, 9:34 AM [facility] Pager: [phone] Office: [phone]

## 2019-10-01 ENCOUNTER — Encounter: Payer: Self-pay | Admitting: Cardiology

## 2019-10-01 ENCOUNTER — Other Ambulatory Visit: Payer: Self-pay

## 2019-10-01 ENCOUNTER — Ambulatory Visit: Payer: Medicare Other | Admitting: Cardiology

## 2019-10-01 VITALS — BP 157/69 | HR 89 | Temp 96.5°F | Resp 16 | Ht 62.0 in | Wt 93.0 lb

## 2019-10-01 DIAGNOSIS — E782 Mixed hyperlipidemia: Secondary | ICD-10-CM

## 2019-10-01 DIAGNOSIS — I10 Essential (primary) hypertension: Secondary | ICD-10-CM

## 2019-10-01 DIAGNOSIS — R0602 Shortness of breath: Secondary | ICD-10-CM

## 2019-10-01 MED ORDER — AMLODIPINE BESYLATE 5 MG PO TABS: 5.0000 mg | ORAL_TABLET | Freq: Every day | ORAL | 2 refills | Status: DC

## 2019-10-17 ENCOUNTER — Ambulatory Visit
Admission: RE | Admit: 2019-10-17 | Discharge: 2019-10-17 | Disposition: A | Discharge status: Home / Self Care | Payer: Medicare Other | Source: Ambulatory Visit | Attending: Family Medicine | Admitting: Family Medicine

## 2019-10-17 ENCOUNTER — Other Ambulatory Visit: Payer: Self-pay | Admitting: Family Medicine

## 2019-10-17 ENCOUNTER — Other Ambulatory Visit: Payer: Self-pay

## 2019-10-17 DIAGNOSIS — M25552 Pain in left hip: Secondary | ICD-10-CM

## 2019-11-14 ENCOUNTER — Other Ambulatory Visit: Payer: Self-pay | Admitting: Otolaryngology

## 2019-11-14 DIAGNOSIS — H918X9 Other specified hearing loss, unspecified ear: Secondary | ICD-10-CM

## 2019-11-27 ENCOUNTER — Other Ambulatory Visit: Payer: Self-pay | Admitting: Pulmonary Disease

## 2019-11-27 MED ORDER — BUDESONIDE-FORMOTEROL FUMARATE 160-4.5 MCG/ACT IN AERO
2.0000 | INHALATION_SPRAY | Freq: Two times a day (BID) | RESPIRATORY_TRACT | 6 refills | Status: DC
Start: 2019-11-27 — End: 2021-04-17

## 2019-12-14 ENCOUNTER — Other Ambulatory Visit: Payer: Self-pay | Admitting: *Deleted

## 2019-12-19 ENCOUNTER — Other Ambulatory Visit: Payer: Self-pay

## 2019-12-19 ENCOUNTER — Ambulatory Visit
Admission: RE | Admit: 2019-12-19 | Discharge: 2019-12-19 | Disposition: A | Discharge status: Home / Self Care | Payer: Medicare Other | Source: Ambulatory Visit | Attending: Otolaryngology | Admitting: Otolaryngology

## 2019-12-19 DIAGNOSIS — H918X9 Other specified hearing loss, unspecified ear: Secondary | ICD-10-CM

## 2019-12-19 MED ORDER — GADOBENATE DIMEGLUMINE 529 MG/ML IV SOLN
8.0000 mL | Freq: Once | INTRAVENOUS | Status: AC | PRN
  Administered 2019-12-19: 8 mL via INTRAVENOUS

## 2020-01-25 ENCOUNTER — Other Ambulatory Visit: Payer: Self-pay | Admitting: Family Medicine

## 2020-01-25 ENCOUNTER — Ambulatory Visit
Admission: RE | Admit: 2020-01-25 | Discharge: 2020-01-25 | Disposition: A | Discharge status: Home / Self Care | Payer: Medicare Other | Source: Ambulatory Visit | Attending: Family Medicine | Admitting: Family Medicine

## 2020-01-25 DIAGNOSIS — R1013 Epigastric pain: Secondary | ICD-10-CM

## 2020-01-25 MED ORDER — IOPAMIDOL (ISOVUE-300) INJECTION 61%
100.0000 mL | Freq: Once | INTRAVENOUS | Status: AC | PRN
  Administered 2020-01-25: 100 mL via INTRAVENOUS

## 2020-02-29 ENCOUNTER — Other Ambulatory Visit: Payer: Self-pay | Admitting: Cardiology

## 2020-02-29 DIAGNOSIS — I10 Essential (primary) hypertension: Secondary | ICD-10-CM

## 2020-06-06 DIAGNOSIS — M48061 Spinal stenosis, lumbar region without neurogenic claudication: Secondary | ICD-10-CM | POA: Diagnosis not present

## 2020-06-06 DIAGNOSIS — M5136 Other intervertebral disc degeneration, lumbar region: Secondary | ICD-10-CM | POA: Diagnosis not present

## 2020-07-07 DIAGNOSIS — Z08 Encounter for follow-up examination after completed treatment for malignant neoplasm: Secondary | ICD-10-CM | POA: Diagnosis not present

## 2020-07-07 DIAGNOSIS — Z85818 Personal history of malignant neoplasm of other sites of lip, oral cavity, and pharynx: Secondary | ICD-10-CM | POA: Diagnosis not present

## 2020-07-07 DIAGNOSIS — C069 Malignant neoplasm of mouth, unspecified: Secondary | ICD-10-CM | POA: Diagnosis not present

## 2020-07-21 DIAGNOSIS — D333 Benign neoplasm of cranial nerves: Secondary | ICD-10-CM | POA: Diagnosis not present

## 2020-07-21 DIAGNOSIS — H9201 Otalgia, right ear: Secondary | ICD-10-CM | POA: Diagnosis not present

## 2020-07-21 DIAGNOSIS — T161XXA Foreign body in right ear, initial encounter: Secondary | ICD-10-CM | POA: Diagnosis not present

## 2020-07-24 DIAGNOSIS — R3915 Urgency of urination: Secondary | ICD-10-CM | POA: Diagnosis not present

## 2020-08-11 DIAGNOSIS — H9201 Otalgia, right ear: Secondary | ICD-10-CM | POA: Diagnosis not present

## 2020-08-11 DIAGNOSIS — H608X3 Other otitis externa, bilateral: Secondary | ICD-10-CM | POA: Diagnosis not present

## 2020-08-20 DIAGNOSIS — R9341 Abnormal radiologic findings on diagnostic imaging of renal pelvis, ureter, or bladder: Secondary | ICD-10-CM | POA: Diagnosis not present

## 2020-08-21 DIAGNOSIS — M5416 Radiculopathy, lumbar region: Secondary | ICD-10-CM | POA: Diagnosis not present

## 2020-09-02 DIAGNOSIS — H40023 Open angle with borderline findings, high risk, bilateral: Secondary | ICD-10-CM | POA: Diagnosis not present

## 2020-09-02 DIAGNOSIS — Z961 Presence of intraocular lens: Secondary | ICD-10-CM | POA: Diagnosis not present

## 2020-09-02 DIAGNOSIS — E119 Type 2 diabetes mellitus without complications: Secondary | ICD-10-CM | POA: Diagnosis not present

## 2020-09-29 DIAGNOSIS — M5416 Radiculopathy, lumbar region: Secondary | ICD-10-CM | POA: Diagnosis not present

## 2020-09-29 DIAGNOSIS — Z79899 Other long term (current) drug therapy: Secondary | ICD-10-CM | POA: Diagnosis not present

## 2020-09-29 DIAGNOSIS — M961 Postlaminectomy syndrome, not elsewhere classified: Secondary | ICD-10-CM | POA: Diagnosis not present

## 2020-10-15 DIAGNOSIS — Z961 Presence of intraocular lens: Secondary | ICD-10-CM | POA: Diagnosis not present

## 2020-10-15 DIAGNOSIS — E119 Type 2 diabetes mellitus without complications: Secondary | ICD-10-CM | POA: Diagnosis not present

## 2020-10-15 DIAGNOSIS — H40023 Open angle with borderline findings, high risk, bilateral: Secondary | ICD-10-CM | POA: Diagnosis not present

## 2020-10-28 DIAGNOSIS — R54 Age-related physical debility: Secondary | ICD-10-CM | POA: Diagnosis not present

## 2020-10-28 DIAGNOSIS — Z0000 Encounter for general adult medical examination without abnormal findings: Secondary | ICD-10-CM | POA: Diagnosis not present

## 2020-10-28 DIAGNOSIS — E1169 Type 2 diabetes mellitus with other specified complication: Secondary | ICD-10-CM | POA: Diagnosis not present

## 2020-10-28 DIAGNOSIS — E43 Unspecified severe protein-calorie malnutrition: Secondary | ICD-10-CM | POA: Diagnosis not present

## 2020-10-28 DIAGNOSIS — I10 Essential (primary) hypertension: Secondary | ICD-10-CM | POA: Diagnosis not present

## 2020-10-28 DIAGNOSIS — I700 Atherosclerosis of aorta: Secondary | ICD-10-CM | POA: Diagnosis not present

## 2020-10-28 DIAGNOSIS — Z681 Body mass index (BMI) 19 or less, adult: Secondary | ICD-10-CM | POA: Diagnosis not present

## 2020-10-28 DIAGNOSIS — E039 Hypothyroidism, unspecified: Secondary | ICD-10-CM | POA: Diagnosis not present

## 2020-10-28 DIAGNOSIS — E785 Hyperlipidemia, unspecified: Secondary | ICD-10-CM | POA: Diagnosis not present

## 2020-10-28 DIAGNOSIS — Z794 Long term (current) use of insulin: Secondary | ICD-10-CM | POA: Diagnosis not present

## 2020-10-28 DIAGNOSIS — I5189 Other ill-defined heart diseases: Secondary | ICD-10-CM | POA: Diagnosis not present

## 2020-11-03 ENCOUNTER — Other Ambulatory Visit: Payer: Self-pay | Admitting: Family Medicine

## 2020-11-03 DIAGNOSIS — E348 Other specified endocrine disorders: Secondary | ICD-10-CM

## 2020-11-04 IMAGING — MR MR LUMBAR SPINE WO/W CM
7 series · 48 of 48 positions shown · IV contrast (8 ml  multihance)
Comparison: MRI lumbar spine 05/08/2019

CLINICAL DATA: Lumbar radiculopathy.  Back surgery May 2019.

Creatinine was obtained on site at [HOSPITAL] at [HOSPITAL].
Results: Creatinine 0.9 mg/dL.
EXAM:
MRI LUMBAR SPINE WITHOUT AND WITH CONTRAST
TECHNIQUE: Multiplanar and multiecho pulse sequences of the lumbar spine were
obtained without and with intravenous contrast.
CONTRAST:  8mL MULTIHANCE GADOBENATE DIMEGLUMINE 529 MG/ML IV SOLN

[Series 3: tirm sag · sagittal · 4.0mm · 0.55mm/px · 3 of 13 slices shown]
[im 1/13]
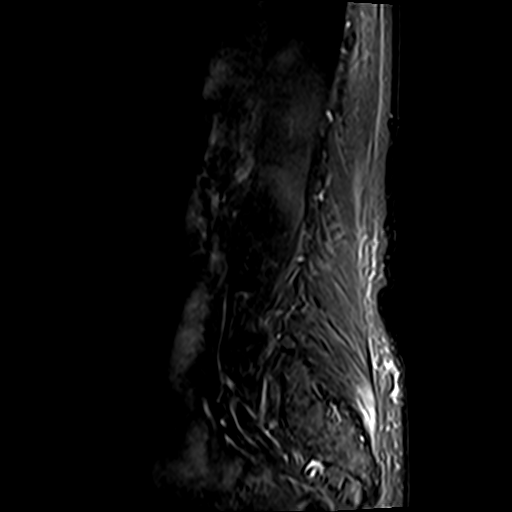
[im 7/13]
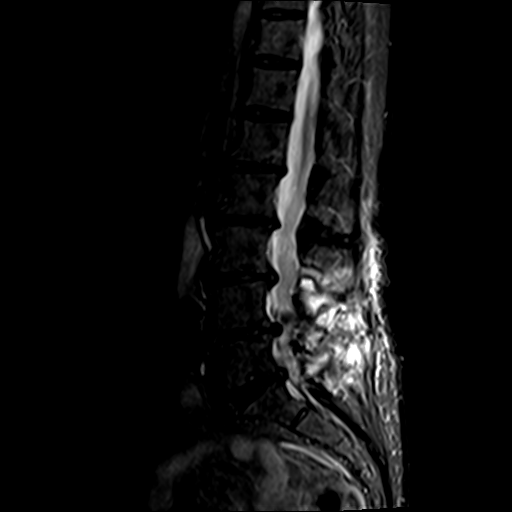
[im 13/13]
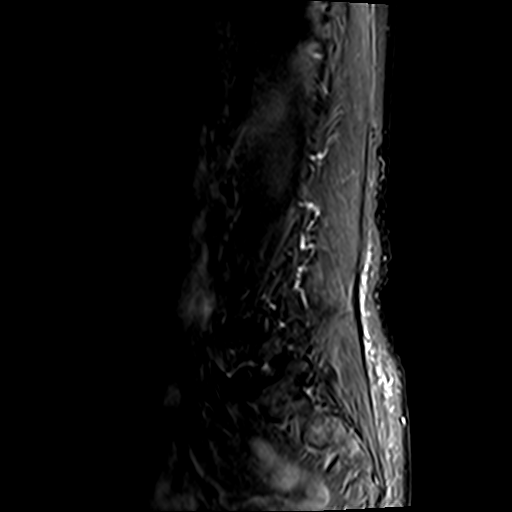

[Series 4: T1 · sagittal · 4.0mm · 0.88mm/px · 4 of 13 slices shown (1 of 2)]
[im 1/13]
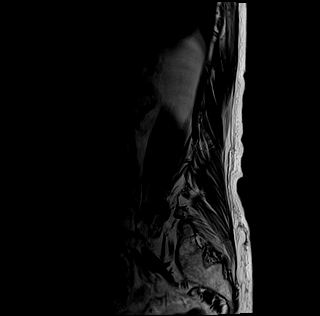
[im 5/13]
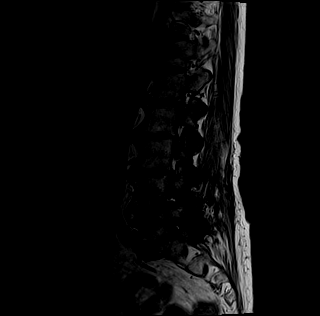
[im 9/13]
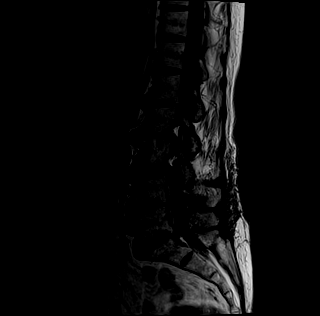
[im 13/13]
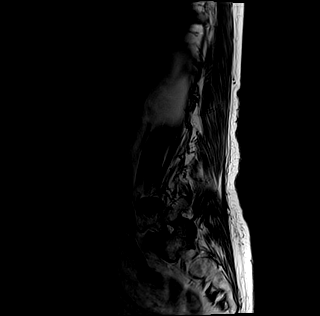

[Series 5: T1 · axial · 4.0mm · 0.78mm/px · z∈[-99,+102]mm · 11 of 37 slices shown (2 of 2)]
[im 1/37]
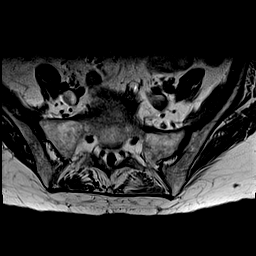
[im 4/37]
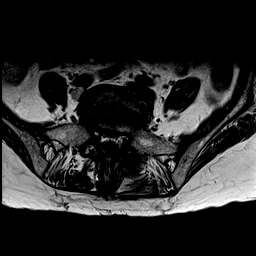
[im 8/37]
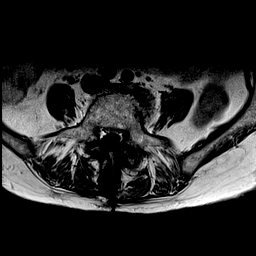
[im 11/37]
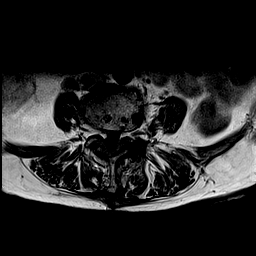
[im 15/37]
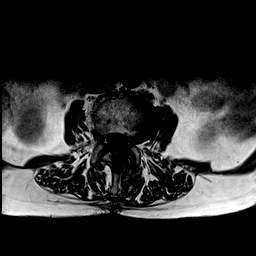
[im 19/37]
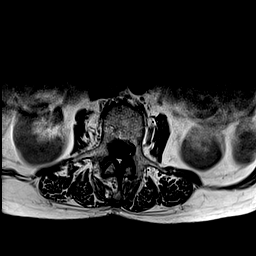
[im 22/37]
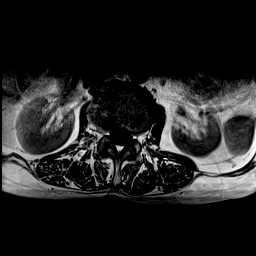
[im 26/37]
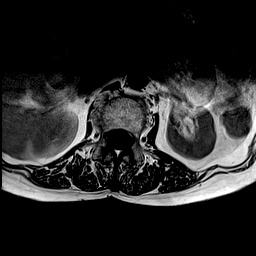
[im 29/37]
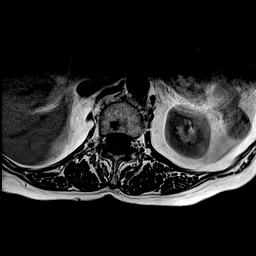
[im 33/37]
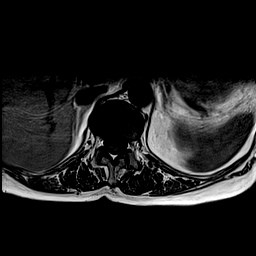
[im 37/37]
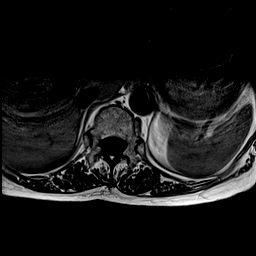

[Series 6: T2 · axial · 4.0mm · 0.78mm/px · z∈[-99,+102]mm · 11 of 37 slices shown (1 of 2)]
[im 1/37]
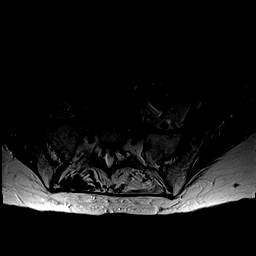
[im 4/37]
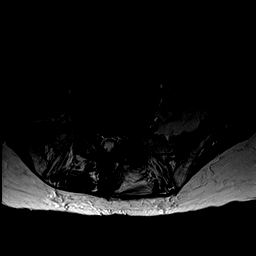
[im 8/37]
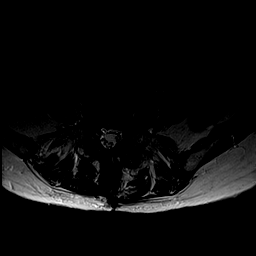
[im 11/37]
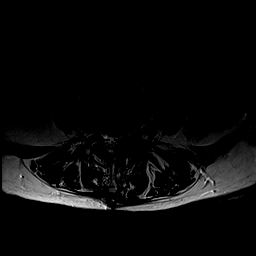
[im 15/37]
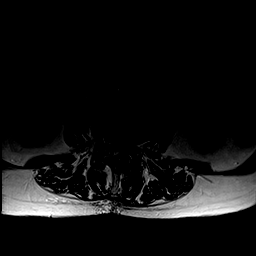
[im 19/37]
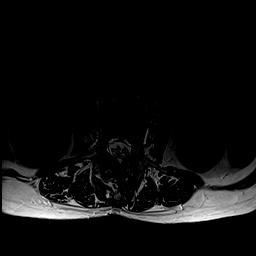
[im 22/37]
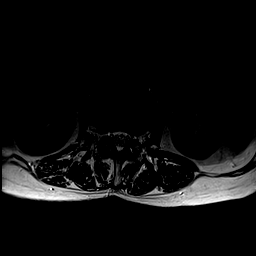
[im 26/37]
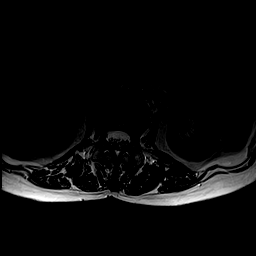
[im 29/37]
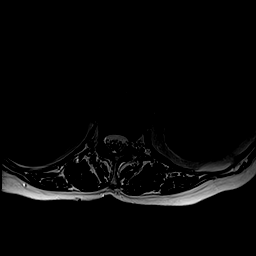
[im 33/37]
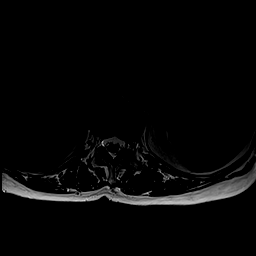
[im 37/37]
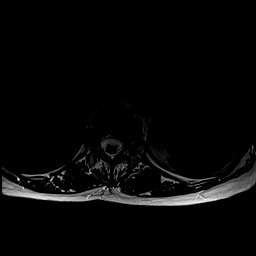

[Series 7: T2 · sagittal · 4.0mm · 0.88mm/px · 4 of 13 slices shown (2 of 2)]
[im 1/13]
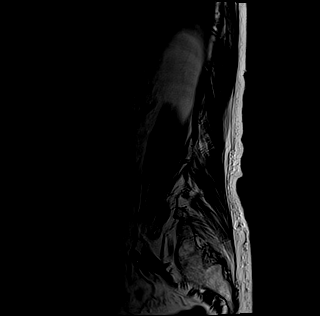
[im 5/13]
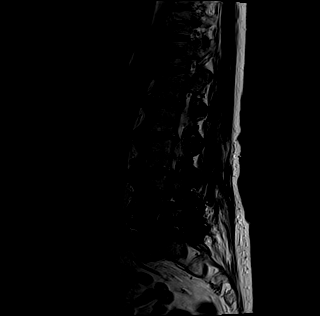
[im 9/13]
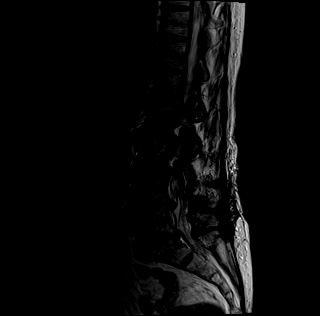
[im 13/13]
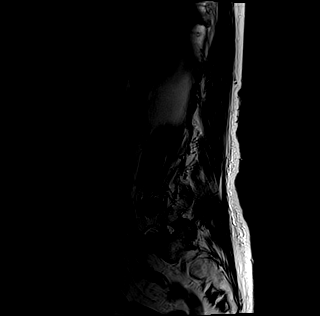

[Series 8: T1 fat-sat post-contrast · sagittal · 4.0mm · 0.88mm/px · 4 of 13 slices shown]
[im 1/13]
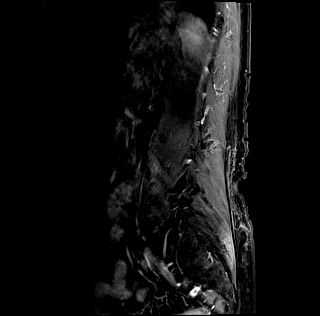
[im 5/13]
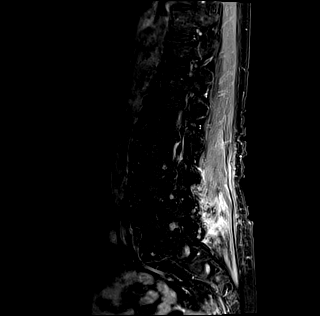
[im 9/13]
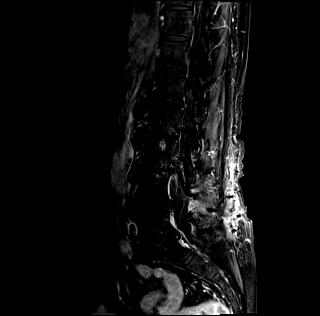
[im 13/13]
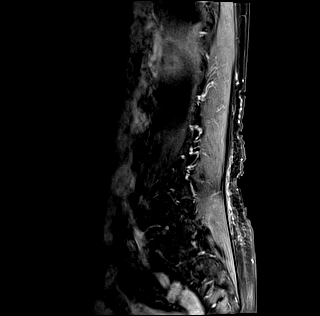

[Series 9: T1 post-contrast · axial · 4.0mm · 0.78mm/px · z∈[-99,+102]mm · 11 of 37 slices shown]
[im 1/37]
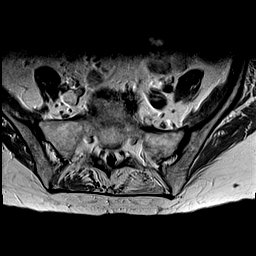
[im 4/37]
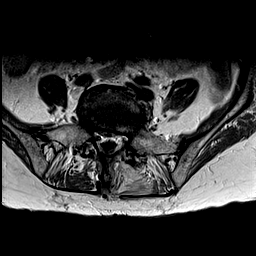
[im 8/37]
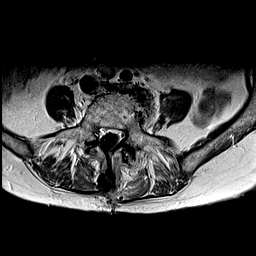
[im 11/37]
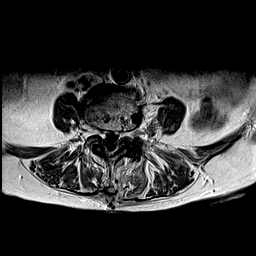
[im 15/37]
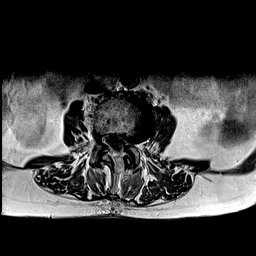
[im 19/37]
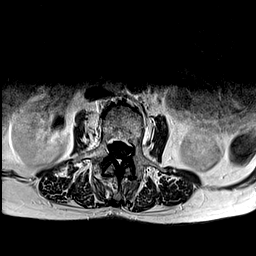
[im 22/37]
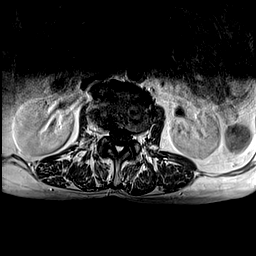
[im 26/37]
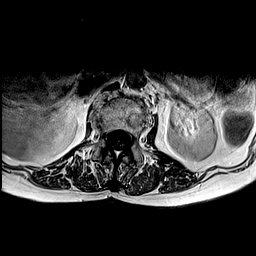
[im 29/37]
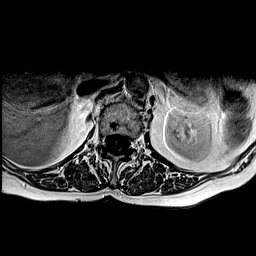
[im 33/37]
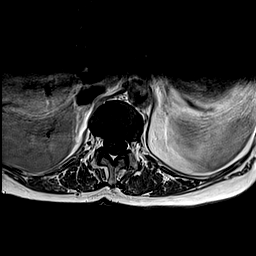
[im 37/37]
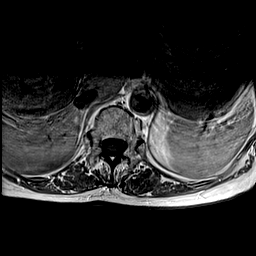

[48 of 48 positions shown; findings below may reference images not displayed]

FINDINGS: Segmentation:  Normal

Alignment:  Slight retrolisthesis L1-2 and L2-3.

Vertebrae:  Negative for fracture or mass.

Conus medullaris and cauda equina: Conus extends to the L1-2 level.
Conus and cauda equina appear normal.

Paraspinal and other soft tissues: Soft tissue enhancement
posteriorly L3 through S1 related to recent surgery and laminectomy
on the left. No fluid collection identified.

Disc levels:

L1-2: Disc degeneration with disc bulging and retrolisthesis.
Negative for stenosis

L2-3: Moderate disc degeneration with disc space narrowing and
diffuse disc bulging. Associated endplate spurring. Mild facet
degeneration. Mild spinal stenosis and mild subarticular stenosis
unchanged from the prior study

L3-4: Broad-based disc protrusion extending to the right is
unchanged. Bilateral facet hypertrophy. Interval laminectomy. Mild
spinal stenosis. Moderate subarticular stenosis on the right
unchanged.

L4-5: Broad-based disc protrusion extending to the left is
unchanged. Posterior decompression. Moderate subarticular stenosis
on the left unchanged. Spinal canal adequately decompressed.

L5-S1: Bilateral laminectomy. Disc degeneration with disc bulging
and endplate spurring. Mild facet degeneration. Moderate
subarticular and foraminal stenosis bilaterally appears unchanged.
This is more prominent on the left than the right.
IMPRESSION: Recent laminectomy on the left L3-4 L4-5 and L5-S1. No fluid
collection or complicating features.

Multilevel disc and facet degeneration, with stenosis as described
above.

## 2021-01-14 DIAGNOSIS — M961 Postlaminectomy syndrome, not elsewhere classified: Secondary | ICD-10-CM | POA: Diagnosis not present

## 2021-01-16 DIAGNOSIS — M85851 Other specified disorders of bone density and structure, right thigh: Secondary | ICD-10-CM | POA: Diagnosis not present

## 2021-01-16 DIAGNOSIS — Z780 Asymptomatic menopausal state: Secondary | ICD-10-CM | POA: Diagnosis not present

## 2021-01-16 DIAGNOSIS — M961 Postlaminectomy syndrome, not elsewhere classified: Secondary | ICD-10-CM | POA: Diagnosis not present

## 2021-01-28 DIAGNOSIS — M5114 Intervertebral disc disorders with radiculopathy, thoracic region: Secondary | ICD-10-CM | POA: Diagnosis not present

## 2021-01-28 DIAGNOSIS — M961 Postlaminectomy syndrome, not elsewhere classified: Secondary | ICD-10-CM | POA: Diagnosis not present

## 2021-02-03 DIAGNOSIS — M961 Postlaminectomy syndrome, not elsewhere classified: Secondary | ICD-10-CM | POA: Diagnosis not present

## 2021-02-18 DIAGNOSIS — E1165 Type 2 diabetes mellitus with hyperglycemia: Secondary | ICD-10-CM | POA: Diagnosis not present

## 2021-02-27 ENCOUNTER — Encounter (HOSPITAL_BASED_OUTPATIENT_CLINIC_OR_DEPARTMENT_OTHER): Payer: Self-pay | Admitting: *Deleted

## 2021-02-27 ENCOUNTER — Emergency Department (HOSPITAL_BASED_OUTPATIENT_CLINIC_OR_DEPARTMENT_OTHER): Payer: Medicare Other

## 2021-02-27 ENCOUNTER — Inpatient Hospital Stay (HOSPITAL_BASED_OUTPATIENT_CLINIC_OR_DEPARTMENT_OTHER)
Admission: EM | Admit: 2021-02-27 | Discharge: 2021-03-03 | DRG: 872 | Disposition: A | Discharge status: Home / Self Care | Payer: Medicare Other | Attending: Internal Medicine | Admitting: Internal Medicine

## 2021-02-27 ENCOUNTER — Other Ambulatory Visit: Payer: Self-pay

## 2021-02-27 DIAGNOSIS — E876 Hypokalemia: Secondary | ICD-10-CM | POA: Diagnosis present

## 2021-02-27 DIAGNOSIS — N12 Tubulo-interstitial nephritis, not specified as acute or chronic: Secondary | ICD-10-CM | POA: Diagnosis not present

## 2021-02-27 DIAGNOSIS — E119 Type 2 diabetes mellitus without complications: Secondary | ICD-10-CM | POA: Diagnosis present

## 2021-02-27 DIAGNOSIS — Z1611 Resistance to penicillins: Secondary | ICD-10-CM | POA: Diagnosis present

## 2021-02-27 DIAGNOSIS — Z85819 Personal history of malignant neoplasm of unspecified site of lip, oral cavity, and pharynx: Secondary | ICD-10-CM

## 2021-02-27 DIAGNOSIS — J45909 Unspecified asthma, uncomplicated: Secondary | ICD-10-CM | POA: Diagnosis not present

## 2021-02-27 DIAGNOSIS — Z888 Allergy status to other drugs, medicaments and biological substances status: Secondary | ICD-10-CM | POA: Diagnosis not present

## 2021-02-27 DIAGNOSIS — Z7951 Long term (current) use of inhaled steroids: Secondary | ICD-10-CM

## 2021-02-27 DIAGNOSIS — R059 Cough, unspecified: Secondary | ICD-10-CM | POA: Diagnosis not present

## 2021-02-27 DIAGNOSIS — Z20822 Contact with and (suspected) exposure to covid-19: Secondary | ICD-10-CM | POA: Diagnosis present

## 2021-02-27 DIAGNOSIS — Z79899 Other long term (current) drug therapy: Secondary | ICD-10-CM

## 2021-02-27 DIAGNOSIS — E860 Dehydration: Secondary | ICD-10-CM | POA: Diagnosis not present

## 2021-02-27 DIAGNOSIS — Z833 Family history of diabetes mellitus: Secondary | ICD-10-CM

## 2021-02-27 DIAGNOSIS — R9431 Abnormal electrocardiogram [ECG] [EKG]: Secondary | ICD-10-CM | POA: Diagnosis not present

## 2021-02-27 DIAGNOSIS — Z794 Long term (current) use of insulin: Secondary | ICD-10-CM | POA: Diagnosis not present

## 2021-02-27 DIAGNOSIS — R300 Dysuria: Secondary | ICD-10-CM | POA: Diagnosis not present

## 2021-02-27 DIAGNOSIS — Z8249 Family history of ischemic heart disease and other diseases of the circulatory system: Secondary | ICD-10-CM

## 2021-02-27 DIAGNOSIS — Z880 Allergy status to penicillin: Secondary | ICD-10-CM | POA: Diagnosis not present

## 2021-02-27 DIAGNOSIS — A419 Sepsis, unspecified organism: Secondary | ICD-10-CM | POA: Diagnosis present

## 2021-02-27 DIAGNOSIS — K651 Peritoneal abscess: Secondary | ICD-10-CM | POA: Diagnosis not present

## 2021-02-27 DIAGNOSIS — I110 Hypertensive heart disease with heart failure: Secondary | ICD-10-CM | POA: Diagnosis not present

## 2021-02-27 DIAGNOSIS — I471 Supraventricular tachycardia: Secondary | ICD-10-CM | POA: Diagnosis present

## 2021-02-27 DIAGNOSIS — Z882 Allergy status to sulfonamides status: Secondary | ICD-10-CM | POA: Diagnosis not present

## 2021-02-27 DIAGNOSIS — A4151 Sepsis due to Escherichia coli [E. coli]: Principal | ICD-10-CM | POA: Diagnosis present

## 2021-02-27 DIAGNOSIS — Z79890 Hormone replacement therapy (postmenopausal): Secondary | ICD-10-CM

## 2021-02-27 DIAGNOSIS — B9620 Unspecified Escherichia coli [E. coli] as the cause of diseases classified elsewhere: Secondary | ICD-10-CM | POA: Diagnosis not present

## 2021-02-27 DIAGNOSIS — I5032 Chronic diastolic (congestive) heart failure: Secondary | ICD-10-CM | POA: Diagnosis present

## 2021-02-27 DIAGNOSIS — E7800 Pure hypercholesterolemia, unspecified: Secondary | ICD-10-CM | POA: Diagnosis not present

## 2021-02-27 DIAGNOSIS — E039 Hypothyroidism, unspecified: Secondary | ICD-10-CM | POA: Diagnosis present

## 2021-02-27 DIAGNOSIS — R000 Tachycardia, unspecified: Secondary | ICD-10-CM | POA: Diagnosis not present

## 2021-02-27 DIAGNOSIS — K5730 Diverticulosis of large intestine without perforation or abscess without bleeding: Secondary | ICD-10-CM | POA: Diagnosis not present

## 2021-02-27 DIAGNOSIS — N390 Urinary tract infection, site not specified: Secondary | ICD-10-CM | POA: Diagnosis not present

## 2021-02-27 DIAGNOSIS — K529 Noninfective gastroenteritis and colitis, unspecified: Secondary | ICD-10-CM | POA: Diagnosis not present

## 2021-02-27 DIAGNOSIS — R7881 Bacteremia: Secondary | ICD-10-CM | POA: Diagnosis not present

## 2021-02-27 HISTORY — DX: Spinal stenosis, site unspecified: M48.00

## 2021-02-27 LAB — COMPREHENSIVE METABOLIC PANEL
ALT: 15 U/L (ref 0–44)
AST: 21 U/L (ref 15–41)
Albumin: 4.1 g/dL (ref 3.5–5.0)
Alkaline Phosphatase: 56 U/L (ref 38–126)
Anion gap: 13 (ref 5–15)
BUN: 15 mg/dL (ref 8–23)
CO2: 23 mmol/L (ref 22–32)
Calcium: 10.1 mg/dL (ref 8.9–10.3)
Chloride: 102 mmol/L (ref 98–111)
Creatinine, Ser: 1.14 mg/dL — ABNORMAL HIGH (ref 0.44–1.00)
GFR, Estimated: 47 mL/min — ABNORMAL LOW (ref >60)
Glucose, Bld: 192 mg/dL — ABNORMAL HIGH (ref 70–99)
Potassium: 4.3 mmol/L (ref 3.5–5.1)
Sodium: 138 mmol/L (ref 135–145)
Total Bilirubin: 0.6 mg/dL (ref 0.3–1.2)
Total Protein: 7.6 g/dL (ref 6.5–8.1)

## 2021-02-27 LAB — CBC
HCT: 38.9 % (ref 36.0–46.0)
Hemoglobin: 12.7 g/dL (ref 12.0–15.0)
MCH: 28.4 pg (ref 26.0–34.0)
MCHC: 32.6 g/dL (ref 30.0–36.0)
MCV: 87 fL (ref 80.0–100.0)
Platelets: 294 10*3/uL (ref 150–400)
RBC: 4.47 MIL/uL (ref 3.87–5.11)
RDW: 13.3 % (ref 11.5–15.5)
WBC: 20.7 10*3/uL — ABNORMAL HIGH (ref 4.0–10.5)
nRBC: 0 % (ref 0.0–0.2)

## 2021-02-27 LAB — RESP PANEL BY RT-PCR (FLU A&B, COVID) ARPGX2
Influenza A by PCR: NEGATIVE
Influenza B by PCR: NEGATIVE
SARS Coronavirus 2 by RT PCR: NEGATIVE

## 2021-02-27 LAB — URINALYSIS, ROUTINE W REFLEX MICROSCOPIC
Bilirubin Urine: NEGATIVE
Glucose, UA: NEGATIVE mg/dL
Ketones, ur: 15 mg/dL — AB
Nitrite: NEGATIVE
Protein, ur: 100 mg/dL — AB
Specific Gravity, Urine: 1.02 (ref 1.005–1.030)
WBC, UA: >50 WBC/hpf — ABNORMAL HIGH (ref 0–5)
pH: 6 (ref 5.0–8.0)

## 2021-02-27 LAB — GLUCOSE, CAPILLARY: Glucose-Capillary: 218 mg/dL — ABNORMAL HIGH (ref 70–99)

## 2021-02-27 LAB — LIPASE, BLOOD: Lipase: <10 U/L — ABNORMAL LOW (ref 11–51)

## 2021-02-27 LAB — LACTIC ACID, PLASMA: Lactic Acid, Venous: 1.5 mmol/L (ref 0.5–1.9)

## 2021-02-27 LAB — CBG MONITORING, ED: Glucose-Capillary: 174 mg/dL — ABNORMAL HIGH (ref 70–99)

## 2021-02-27 MED ORDER — SODIUM CHLORIDE 0.9 % IV SOLN
1.0000 g | Freq: Once | INTRAVENOUS | Status: AC
  Administered 2021-02-27: 1 g via INTRAVENOUS
  Filled 2021-02-27: qty 10

## 2021-02-27 MED ORDER — LEVOTHYROXINE SODIUM 25 MCG PO TABS: 25.0000 ug | ORAL_TABLET | ORAL | Status: DC

## 2021-02-27 MED ORDER — LEVOTHYROXINE SODIUM 50 MCG PO TABS
50.0000 ug | ORAL_TABLET | ORAL | Status: DC
  Administered 2021-03-01: 50 ug via ORAL
  Filled 2021-02-27: qty 1

## 2021-02-27 MED ORDER — INSULIN GLARGINE-YFGN 100 UNIT/ML ~~LOC~~ SOLN
8.0000 [IU] | Freq: Every day | SUBCUTANEOUS | Status: DC
  Administered 2021-02-27 – 2021-03-02 (×4): 8 [IU] via SUBCUTANEOUS
  Filled 2021-02-27 (×6): qty 0.08

## 2021-02-27 MED ORDER — ACETAMINOPHEN 325 MG PO TABS
650.0000 mg | ORAL_TABLET | Freq: Once | ORAL | Status: AC
  Administered 2021-02-27: 650 mg via ORAL
  Filled 2021-02-27: qty 2

## 2021-02-27 MED ORDER — ONDANSETRON HCL 4 MG/2ML IJ SOLN: 4.0000 mg | Freq: Four times a day (QID) | INTRAMUSCULAR | Status: DC | PRN

## 2021-02-27 MED ORDER — IOHEXOL 300 MG/ML  SOLN
100.0000 mL | Freq: Once | INTRAMUSCULAR | Status: AC | PRN
  Administered 2021-02-27: 100 mL via INTRAVENOUS

## 2021-02-27 MED ORDER — LACTATED RINGERS IV SOLN: INTRAVENOUS | Status: DC

## 2021-02-27 MED ORDER — ACETAMINOPHEN 650 MG RE SUPP: 650.0000 mg | Freq: Four times a day (QID) | RECTAL | Status: DC | PRN

## 2021-02-27 MED ORDER — INSULIN ASPART 100 UNIT/ML IJ SOLN
0.0000 [IU] | Freq: Three times a day (TID) | INTRAMUSCULAR | Status: DC
  Administered 2021-02-28: 5 [IU] via SUBCUTANEOUS
  Administered 2021-02-28 – 2021-03-01 (×2): 2 [IU] via SUBCUTANEOUS
  Administered 2021-03-01: 5 [IU] via SUBCUTANEOUS
  Administered 2021-03-01: 1 [IU] via SUBCUTANEOUS
  Administered 2021-03-02: 2 [IU] via SUBCUTANEOUS
  Administered 2021-03-02: 5 [IU] via SUBCUTANEOUS
  Administered 2021-03-02: 1 [IU] via SUBCUTANEOUS
  Administered 2021-03-03: 2 [IU] via SUBCUTANEOUS
  Administered 2021-03-03: 7 [IU] via SUBCUTANEOUS

## 2021-02-27 MED ORDER — ACETAMINOPHEN 325 MG PO TABS
650.0000 mg | ORAL_TABLET | Freq: Four times a day (QID) | ORAL | Status: DC | PRN
  Administered 2021-02-28 – 2021-03-01 (×3): 650 mg via ORAL
  Filled 2021-02-27 (×3): qty 2

## 2021-02-27 MED ORDER — MOMETASONE FURO-FORMOTEROL FUM 200-5 MCG/ACT IN AERO
2.0000 | INHALATION_SPRAY | Freq: Two times a day (BID) | RESPIRATORY_TRACT | Status: DC
Start: 2021-02-27 — End: 2021-03-03
  Administered 2021-02-28 – 2021-03-03 (×7): 2 via RESPIRATORY_TRACT
  Filled 2021-02-27: qty 8.8

## 2021-02-27 MED ORDER — SODIUM CHLORIDE 0.9 % IV SOLN
2.0000 g | INTRAVENOUS | Status: DC
  Administered 2021-02-28 – 2021-03-01 (×2): 2 g via INTRAVENOUS
  Filled 2021-02-27 (×3): qty 20

## 2021-02-27 MED ORDER — LATANOPROST 0.005 % OP SOLN
1.0000 [drp] | Freq: Every day | OPHTHALMIC | Status: DC
  Administered 2021-02-27 – 2021-03-02 (×4): 1 [drp] via OPHTHALMIC
  Filled 2021-02-27: qty 2.5

## 2021-02-27 MED ORDER — ENOXAPARIN SODIUM 30 MG/0.3ML IJ SOSY
30.0000 mg | PREFILLED_SYRINGE | INTRAMUSCULAR | Status: DC
  Administered 2021-02-27: 30 mg via SUBCUTANEOUS
  Filled 2021-02-27: qty 0.3

## 2021-02-27 MED ORDER — MIRABEGRON ER 25 MG PO TB24
25.0000 mg | ORAL_TABLET | Freq: Every day | ORAL | Status: DC
  Administered 2021-02-27 – 2021-03-03 (×5): 25 mg via ORAL
  Filled 2021-02-27 (×5): qty 1

## 2021-02-27 MED ORDER — LACTATED RINGERS IV BOLUS
1000.0000 mL | Freq: Once | INTRAVENOUS | Status: AC
  Administered 2021-02-27: 1000 mL via INTRAVENOUS

## 2021-02-27 MED ORDER — LEVOTHYROXINE SODIUM 25 MCG PO TABS
25.0000 ug | ORAL_TABLET | ORAL | Status: DC
  Administered 2021-02-28 – 2021-03-02 (×2): 25 ug via ORAL
  Filled 2021-02-27 (×2): qty 1

## 2021-02-27 MED ORDER — ONDANSETRON HCL 4 MG PO TABS: 4.0000 mg | ORAL_TABLET | Freq: Four times a day (QID) | ORAL | Status: DC | PRN

## 2021-02-27 MED ORDER — INSULIN GLARGINE 100 UNIT/ML SOLOSTAR PEN: 8.0000 [IU] | PEN_INJECTOR | Freq: Every day | SUBCUTANEOUS | Status: DC

## 2021-02-27 MED ORDER — SODIUM CHLORIDE 0.9 % IV SOLN
1.0000 g | INTRAVENOUS | Status: DC
  Administered 2021-02-27: 1 g via INTRAVENOUS
  Filled 2021-02-27: qty 10

## 2021-02-27 MED ORDER — EZETIMIBE-SIMVASTATIN 10-20 MG PO TABS
1.0000 | ORAL_TABLET | Freq: Every day | ORAL | Status: DC
  Filled 2021-02-27: qty 1

## 2021-02-27 MED ORDER — LINAGLIPTIN 5 MG PO TABS
5.0000 mg | ORAL_TABLET | Freq: Every day | ORAL | Status: DC
  Administered 2021-02-28 – 2021-03-03 (×4): 5 mg via ORAL
  Filled 2021-02-27 (×4): qty 1

## 2021-02-27 MED ORDER — ALBUTEROL SULFATE (2.5 MG/3ML) 0.083% IN NEBU: 2.5000 mg | INHALATION_SOLUTION | Freq: Four times a day (QID) | RESPIRATORY_TRACT | Status: DC | PRN

## 2021-02-27 MED ORDER — MEGESTROL ACETATE 400 MG/10ML PO SUSP
400.0000 mg | Freq: Every day | ORAL | Status: DC
  Administered 2021-02-28 – 2021-03-03 (×4): 400 mg via ORAL
  Filled 2021-02-27 (×4): qty 10

## 2021-02-27 NOTE — H&P (Signed)
History and Physical    Hannah [name] YHC:[id number] DOB: [birth year] DOA: 02/27/2021  I have briefly reviewed the patient's prior medical records in [place]  PCP: Donald Prose, MD  Patient coming from: home  Chief Complaint: nausea/vomiting  HPI: Hannah [name] is a 87 y.o. female with medical history significant of DM2, arthritis, HTN, hypothyroidism who comes to the hospital with complaints of nausea, vomiting, poor p.o. intake over the last 3 to 4 days.  She also has been complaining of lower abdominal discomfort as well as dysuria for the same amount of time.  She reports fevers at home.  She denies any chest pain, denies any shortness of breath.  She reports increased frequency as well as back pain (however this is chronic for her).  I was able to talk to the daughter and daughter feels like she may have had symptoms for a little while longer, but 2 days ago she noticed that her mother had significant weakness, was feeling very sick and appeared to have chills.  Given progression of her symptoms and continued to feel somnolent, sick and had poor p.o. intake they called the primary care doctor this morning who advised them to bring patient to the ER.  ED Course: In the ED patient is febrile to 101, tachycardic into the 120s, normotensive and satting well on room air.  Her blood work reveals a creatinine of 1.14, and a WBC of 20.7.  COVID was negative.  Urinalysis was grossly positive/pyuria.  CT scan of the abdomen pelvis raise concern for cystitis as well as right-sided pyelonephritis.  Cultures were obtained, she was given antibiotics and she was admitted to the hospital.  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   surgery and radiation   Spinal stenosis    Tuberculosis    1978    Weight loss, unintentional     Past Surgical History:  Procedure Laterality Date   AORTIC ARCH ANGIOGRAPHY  09/2019   COLONOSCOPY     EYE SURGERY Bilateral    cataracts   LIPOMA EXCISION     back   LUMBAR LAMINECTOMY/DECOMPRESSION MICRODISCECTOMY Right 04/12/2018   Procedure: Laminectomy and Foraminotomy - Lumbar four-Lumbar five - Lumbar five-Sacral one - right;  Surgeon: Eustace Moore, MD;  Location: [facility];  Service: Neurosurgery;  Laterality: Right;   LUMBAR SPINE SURGERY  05/2019   MASS EXCISION Left 04/02/2019   Procedure: EXCISION OF LIPOMATOUS MASS OF THE LEFT BACK;  Surgeon: Johnathan Hausen, MD;  Location: [facility];  Service: General;  Laterality: Left;   OTHER SURGICAL HISTORY     squamous cell removal;skin graft   RIGHT/LEFT HEART CATH AND CORONARY ANGIOGRAPHY N/A 09/18/2019   Procedure: RIGHT/LEFT HEART CATH AND CORONARY ANGIOGRAPHY;  Surgeon: Adrian Prows, MD;  Location: [facility];  Service: Cardiovascular;  Laterality: N/A;   TOOTH EXTRACTION     all pulled     reports that she has never smoked. She has never used smokeless tobacco. She reports that she does not drink alcohol and does not use drugs.  Allergies  Allergen Reactions   Sulfur    Penicillins Rash    1980's   Sulfa Antibiotics Rash    Family History  Problem Relation Age of Onset  Arthritis Mother        Late 56s   Heart attack Father        Deceased, 66   Heart disease Brother    Hypertension Other    Hyperlipidemia Other    Diabetes type II Sister    Diabetes Brother     Prior to Admission medications   Medication Sig Start Date End Date Taking? Authorizing Provider  acetaminophen (TYLENOL) 325 MG tablet Take 650 mg by mouth every 6 (six) hours as needed for mild pain.    Yes [provider]  albuterol (VENTOLIN HFA) 108 (90 Base) MCG/ACT inhaler Inhale 2 puffs into the lungs every 6 (six) hours as needed for wheezing or shortness of breath. 02/20/19  Yes  Icard, Bradley L, DO  amLODipine (NORVASC) 5 MG tablet Take 1 tablet by mouth once daily Patient taking differently: Take 5 mg by mouth daily. 03/03/20  Yes Adrian Prows, MD  augmented betamethasone dipropionate (DIPROLENE-AF) 0.05 % cream Apply 1 application topically daily as needed (rash).  07/21/18  Yes [provider]  budesonide-formoterol (SYMBICORT) 160-4.5 MCG/ACT inhaler Inhale 2 puffs into the lungs 2 (two) times daily. 11/27/19  Yes Icard, Octavio Graves, DO  Cholecalciferol (VITAMIN D-3) 125 MCG (5000 UT) TABS Take 1,000 mg by mouth daily.   Yes [provider]  cyanocobalamin 1000 MCG tablet Take 1,000 mcg by mouth daily.   Yes [provider]  diclofenac sodium (VOLTAREN) 1 % GEL Apply 2 g topically 3 (three) times daily as needed (joint pain).   Yes [provider]  ezetimibe-simvastatin (VYTORIN) 10-20 MG per tablet Take 1 tablet by mouth daily.    Yes [provider]  insulin glargine (LANTUS SOLOSTAR) 100 UNIT/ML Solostar Pen Inject 10 Units into the skin at bedtime.   Yes [provider]  insulin lispro (HUMALOG KWIKPEN) 100 UNIT/ML KwikPen Inject 2-10 Units into the skin 3 (three) times daily. Sliding scale   Yes [provider]  levothyroxine (SYNTHROID, LEVOTHROID) 50 MCG tablet Take 25-50 mcg by mouth See admin instructions. Take 41mcg alternating 34mcg every other day.   Yes [provider]  lidocaine (XYLOCAINE) 2 % solution Use as directed 3 mLs in the mouth or throat daily as needed for mouth pain. 04/28/18  Yes [provider]  megestrol (MEGACE) 400 MG/10ML suspension Take 400 mg by mouth daily.    Yes [provider]  sitaGLIPtin (JANUVIA) 25 MG tablet Take 50 mg by mouth daily.   Yes [provider]  chlorthalidone (HYGROTON) 25 MG tablet Take 1 tablet (25 mg total) by mouth every morning. Patient not taking: Reported on 02/27/2021 09/18/19 12/17/19  Adrian Prows, MD   HYDROcodone-acetaminophen (NORCO/VICODIN) 5-325 MG tablet Take 1 tablet by mouth every 6 (six) hours as needed for moderate pain. Patient not taking: Reported on 02/27/2021 04/02/19   Johnathan Hausen, MD    Physical Exam: Vitals:   02/27/21 1416 02/27/21 1431 02/27/21 1500 02/27/21 1557  BP: (!) 187/84  (!) 148/91 126/63  Pulse: (!) 126  61 (!) 103  Resp: (!) 25  (!) 27 (!) 24  Temp: 100.3 F (37.9 C) (!) 101.3 F (38.5 C)  98.2 F (36.8 C)  TempSrc: Oral Rectal  Oral  SpO2: 100%  98% 98%  Weight:      Height:        Constitutional: NAD, calm, comfortable Eyes: PERRL, lids and conjunctivae normal, no scleral icterus ENMT: Mucous membranes are dry Neck: normal, supple Respiratory: clear  to auscultation bilaterally, no wheezing, no crackles. Normal respiratory effort.  Diminished at the bases Cardiovascular: Regular rate and rhythm, no murmurs / rubs / gallops. No extremity edema.  Abdomen: no tenderness, no masses palpated. Bowel sounds positive.  Mild discomfort in the suprapubic area, no guarding Musculoskeletal: no clubbing / cyanosis. Normal muscle tone.  Skin: no rashes, lesions, ulcers. No induration Neurologic: CN 2-12 grossly intact. Strength 5/5 in all 4.  Psychiatric: Normal judgment and insight. Alert and oriented x 3. Normal mood.   Labs on Admission: I have personally reviewed following labs and imaging studies  CBC: Recent Labs  Lab 02/27/21 1030  WBC 20.7*  HGB 12.7  HCT 38.9  MCV 87.0  PLT 818   Basic Metabolic Panel: Recent Labs  Lab 02/27/21 1030  NA 138  K 4.3  CL 102  CO2 23  GLUCOSE 192*  BUN 15  CREATININE 1.14*  CALCIUM 10.1   Liver Function Tests: Recent Labs  Lab 02/27/21 1030  AST 21  ALT 15  ALKPHOS 56  BILITOT 0.6  PROT 7.6  ALBUMIN 4.1   Coagulation Profile: No results for input(s): INR, PROTIME in the last 168 hours. BNP (last 3 results) No results for input(s): PROBNP in the last 8760 hours. CBG: Recent Labs   Lab 02/27/21 1026  GLUCAP 174*   Thyroid Function Tests: No results for input(s): TSH, T4TOTAL, FREET4, T3FREE, THYROIDAB in the last 72 hours. Urine analysis:    Component Value Date/Time   COLORURINE YELLOW 02/27/2021 1115   APPEARANCEUR CLOUDY (A) 02/27/2021 1115   LABSPEC 1.020 02/27/2021 1115   PHURINE 6.0 02/27/2021 1115   GLUCOSEU NEGATIVE 02/27/2021 1115   HGBUR MODERATE (A) 02/27/2021 1115   BILIRUBINUR NEGATIVE 02/27/2021 1115   KETONESUR 15 (A) 02/27/2021 1115   PROTEINUR 100 (A) 02/27/2021 1115   UROBILINOGEN 0.2 09/27/2012 1638   NITRITE NEGATIVE 02/27/2021 1115   LEUKOCYTESUR LARGE (A) 02/27/2021 1115     Radiological Exams on Admission: CT Abdomen Pelvis W Contrast  Result Date: 02/27/2021 CLINICAL DATA:  Abdominal abscess/infection suspected EXAM: CT ABDOMEN AND PELVIS WITH CONTRAST TECHNIQUE: Multidetector CT imaging of the abdomen and pelvis was performed using the standard protocol following bolus administration of intravenous contrast. CONTRAST:  151mL OMNIPAQUE IOHEXOL 300 MG/ML  SOLN COMPARISON:  September 2021 FINDINGS: Lower chest: No acute abnormality. Hepatobiliary: No focal liver abnormality is seen. No gallstones, gallbladder wall thickening, or biliary dilatation. Pancreas: Atrophic.  Otherwise unremarkable. Spleen: Unremarkable. Adrenals/Urinary Tract: Adrenals are unremarkable. There is heterogeneous, decreased enhancement at the lower pole the right kidney. Partially distended bladder with wall thickening. Urothelial enhancement noted. No calculi. Stomach/Bowel: Small hiatal hernia. Stomach is otherwise unremarkable. Bowel is normal in caliber. Minimal distal colonic diverticulosis. Vascular/Lymphatic: Aortic atherosclerosis. No enlarged lymph nodes. Reproductive: Uterus and bilateral adnexa are unremarkable. Other: No free fluid.  Abdominal wall is unremarkable. Musculoskeletal: Degenerative changes of the included spine. No acute osseous abnormality.  IMPRESSION: Findings suspicious for cystitis with ascending urinary tract infection and right pyelonephritis. No calculi. Aortic atherosclerosis. Minimal colonic diverticulosis. Electronically Signed   By: Macy Mis M.D.   On: 02/27/2021 13:20   DG Chest Port 1 View  Result Date: 02/27/2021 CLINICAL DATA:  Cough EXAM: PORTABLE CHEST 1 VIEW COMPARISON:  07/14/2018 FINDINGS: Cardiomediastinal contours within normal limits. Atherosclerotic calcification of the aortic knob. Coarsened interstitial markings bilaterally. No focal airspace consolidation, pleural effusion, or pneumothorax. IMPRESSION: Coarsened interstitial markings bilaterally, which may reflect bronchitic type lung changes. No focal airspace  consolidation. Electronically Signed   By: Davina Poke D.O.   On: 02/27/2021 13:12    EKG: Independently reviewed.  Sinus tachycardia  Assessment/Plan  Principal Problem Sepsis due to urinary tract infection/pyelonephritis -patient will be admitted to the hospital, given broad-spectrum intravenous antibiotics -Blood cultures, urine culture sent, follow microbiology -Continue IV fluids, supportive care  Active Problems Essential hypertension -hold home antihypertensives (amlodipine, chlorthalidone) agents in the setting of sepsis, resume in a.m. if blood pressure allows  Type 2 diabetes mellitus-continue Lantus at a lower dose given poor p.o. intake, placed on sensitive sliding scale.  Hypothyroidism -continue home Synthroid  Hyperlipidemia -continue home medications  Chronic asthma-no wheezing, continue home medications  Chronic dyspnea on exertion-continue inhalers as above, recently worked up by cardiology with a negative cardiac cath  DVT prophylaxis: Lovenox Code Status: Full code Family Communication: Discussed with daughter Aashna Matson over the phone Disposition Plan: Likely home on improved Bed Type: Progressive Consults called: None Obs/Inp: Inpatient  At the time of  admission, it appears that the appropriate admission status for this patient is INPATIENT as it is expected that patient will require hospital care > 2 midnights. This is judged to be reasonable and necessary in order to provide the required intensity of service to ensure the patient's safety given: presenting symptoms, initial radiographic and laboratory data and in the context of their chronic comorbidities. Together, these circumstances are felt to place patient at high at high risk for further clinical deterioration threatening life, limb, or organ.  Marzetta Board, MD, PhD [facility]  Contact via [email or web address]  02/27/2021, 4:07 PM

## 2021-02-27 NOTE — ED Provider Notes (Signed)
[facility] EMERGENCY DEPT Provider Note   CSN: [id number] Arrival date & time: 02/27/21  1016     History Chief Complaint  Patient presents with   Nausea   Emesis   Diarrhea    Hannah [name] is a 87 y.o. female.  87 year old female presents with several days of decreased oral intake as well as emesis.  No reported fever.  Has had some dysuria.  Has chronic left-sided back pain.  Slight cough and congestion noted.  No emesis today but yesterday which was described as not being bilious or bloody.  Diarrhea has been present but it is not been bloody or dark.  Has had issues with blood sugars been labile.  This morning blood sugar was 179.  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   Wheezing 04/19/2019   Chronic obstructive asthma ([place]) 04/19/2019   Decreased diffusion capacity 04/19/2019   Dyspnea on exertion 10/30/2018   Nonrheumatic mitral valve regurgitation 10/30/2018   S/P lumbar laminectomy 04/12/2018    Past Surgical History:  Procedure Laterality Date   AORTIC ARCH ANGIOGRAPHY  09/2019   COLONOSCOPY     EYE SURGERY Bilateral    cataracts   LIPOMA EXCISION     back   LUMBAR LAMINECTOMY/DECOMPRESSION MICRODISCECTOMY Right 04/12/2018   Procedure: Laminectomy and Foraminotomy - Lumbar four-Lumbar five - Lumbar five-Sacral one - right;  Surgeon: Eustace Moore, MD;  Location: [facility];  Service: Neurosurgery;  Laterality: Right;   LUMBAR SPINE SURGERY  05/2019   MASS EXCISION Left 04/02/2019   Procedure: EXCISION OF LIPOMATOUS MASS OF THE LEFT  BACK;  Surgeon: Johnathan Hausen, MD;  Location: [facility];  Service: General;  Laterality: Left;   OTHER SURGICAL HISTORY     squamous cell removal;skin graft   RIGHT/LEFT HEART CATH AND CORONARY ANGIOGRAPHY N/A 09/18/2019   Procedure: RIGHT/LEFT HEART CATH AND CORONARY ANGIOGRAPHY;  Surgeon: Adrian Prows, MD;  Location: [facility];  Service: Cardiovascular;  Laterality: N/A;   TOOTH EXTRACTION     all pulled     OB History   No obstetric history on file.     Family History  Problem Relation Age of Onset   Arthritis Mother        Late 61s   Heart attack Father        Deceased, 86   Heart disease Brother    Hypertension Other    Hyperlipidemia Other    Diabetes type II Sister    Diabetes Brother     Social History   Tobacco Use   Smoking status: Never   Smokeless tobacco: Never  Vaping Use   Vaping Use: Never used  Substance Use Topics   Alcohol use: No    Alcohol/week: 0.0 standard drinks   Drug use: No    Home Medications Prior to Admission medications   Medication Sig Start Date End Date Taking? Authorizing Provider  acetaminophen (TYLENOL) 325 MG tablet Take 650 mg by mouth every  6 (six) hours as needed for mild pain.     [provider]  albuterol (VENTOLIN HFA) 108 (90 Base) MCG/ACT inhaler Inhale 2 puffs into the lungs every 6 (six) hours as needed for wheezing or shortness of breath. 02/20/19   Garner Nash, DO  amLODipine (NORVASC) 5 MG tablet Take 1 tablet by mouth once daily 03/03/20   Adrian Prows, MD  augmented betamethasone dipropionate (DIPROLENE-AF) 0.05 % cream Apply 1 application topically daily as needed (rash).  07/21/18   [provider]  budesonide-formoterol (SYMBICORT) 160-4.5 MCG/ACT inhaler Inhale 2 puffs into the lungs 2 (two) times daily. 11/27/19   Icard, Octavio Graves, DO  chlorthalidone (HYGROTON) 25 MG tablet Take 1 tablet (25 mg total) by mouth every morning. 09/18/19 12/17/19  Adrian Prows, MD  Cholecalciferol  (VITAMIN D-3) 125 MCG (5000 UT) TABS Take 1,000 mg by mouth daily.    [provider]  cyanocobalamin 1000 MCG tablet Take 1,000 mcg by mouth daily.    [provider]  diclofenac sodium (VOLTAREN) 1 % GEL Apply 2 g topically 3 (three) times daily as needed (joint pain).    [provider]  donepezil (ARICEPT) 5 MG tablet Take 5 mg by mouth at bedtime.  04/28/18   [provider]  ezetimibe-simvastatin (VYTORIN) 10-20 MG per tablet Take 1 tablet by mouth daily.     [provider]  Fluticasone Propionate 0.05 % LOTN Apply 1 application topically as needed (Psoriasis).     [provider]  HYDROcodone-acetaminophen (NORCO/VICODIN) 5-325 MG tablet Take 1 tablet by mouth every 6 (six) hours as needed for moderate pain. 04/02/19   Johnathan Hausen, MD  levothyroxine (SYNTHROID, LEVOTHROID) 50 MCG tablet Take 25-50 mcg by mouth See admin instructions. Take 42mcg alternating 51mcg every other day.    [provider]  lidocaine (XYLOCAINE) 2 % solution Use as directed 3 mLs in the mouth or throat as needed for mouth pain.  04/28/18   [provider]  megestrol (MEGACE) 400 MG/10ML suspension Take 400 mg by mouth daily.     [provider]  sertraline (ZOLOFT) 100 MG tablet Take 100 mg by mouth daily.     [provider]  sitaGLIPtin (JANUVIA) 25 MG tablet Take 25 mg by mouth daily.    [provider]    Allergies    Penicillins and Sulfa antibiotics  Review of Systems   Review of Systems  All other systems reviewed and are negative.  Physical Exam Updated Vital Signs BP (!) 161/78   Pulse (!) 101   Temp 98.3 F (36.8 C)   Resp 17   Ht 1.575 m (5\' 2" )   Wt 39.9 kg   SpO2 99%   BMI 16.10 kg/m   Physical Exam Vitals and nursing note reviewed.  Constitutional:      General: She is not in acute distress.    Appearance: Normal appearance. She is well-developed. She is not toxic-appearing.   HENT:     Head: Normocephalic and atraumatic.  Eyes:     General: Lids are normal.     Conjunctiva/sclera: Conjunctivae normal.     Pupils: Pupils are equal, round, and reactive to light.  Neck:     Thyroid: No thyroid mass.     Trachea: No tracheal deviation.  Cardiovascular:     Rate and Rhythm: Normal rate and regular rhythm.     Heart sounds: Normal heart sounds. No murmur heard.   No gallop.  Pulmonary:  Effort: Pulmonary effort is normal. No respiratory distress.     Breath sounds: Normal breath sounds. No stridor. No decreased breath sounds, wheezing, rhonchi or rales.  Abdominal:     General: There is no distension.     Palpations: Abdomen is soft.     Tenderness: There is generalized abdominal tenderness. There is no guarding or rebound.  Musculoskeletal:        General: No tenderness. Normal range of motion.     Cervical back: Normal range of motion and neck supple.  Skin:    General: Skin is warm and dry.     Findings: No abrasion or rash.  Neurological:     Mental Status: She is alert and oriented to person, place, and time. Mental status is at baseline.     GCS: GCS eye subscore is 4. GCS verbal subscore is 5. GCS motor subscore is 6.     Cranial Nerves: Cranial nerves are intact. No cranial nerve deficit.     Sensory: No sensory deficit.     Motor: Motor function is intact.  Psychiatric:        Attention and Perception: Attention normal.        Speech: Speech normal.        Behavior: Behavior normal.    ED Results / Procedures / Treatments   Labs (all labs ordered are listed, but only abnormal results are displayed) Labs Reviewed  LIPASE, BLOOD - Abnormal; Notable for the following components:      Result Value   Lipase <10 (*)    All other components within normal limits  COMPREHENSIVE METABOLIC PANEL - Abnormal; Notable for the following components:   Glucose, Bld 192 (*)    Creatinine, Ser 1.14 (*)    GFR, Estimated 47 (*)    All other  components within normal limits  CBC - Abnormal; Notable for the following components:   WBC 20.7 (*)    All other components within normal limits  CBG MONITORING, ED - Abnormal; Notable for the following components:   Glucose-Capillary 174 (*)    All other components within normal limits  CULTURE, BLOOD (ROUTINE X 2)  CULTURE, BLOOD (ROUTINE X 2)  URINALYSIS, ROUTINE W REFLEX MICROSCOPIC  LACTIC ACID, PLASMA    EKG EKG Interpretation  Date/Time:  Friday February 27 2021 10:32:59 EDT Ventricular Rate:  107 PR Interval:  146 QRS Duration: 70 QT Interval:  328 QTC Calculation: 437 R Axis:   74 Text Interpretation: Sinus tachycardia Otherwise normal ECG Confirmed by Lacretia Leigh ([id number]) on 02/27/2021 11:38:43 AM  Radiology No results found.  Procedures Procedures   Medications Ordered in ED Medications  lactated ringers infusion (has no administration in time range)  lactated ringers bolus 1,000 mL (has no administration in time range)  iohexol (OMNIPAQUE) 300 MG/ML solution 100 mL (has no administration in time range)    ED Course  I have reviewed the triage vital signs and the nursing notes.  Pertinent labs & imaging results that were available during my care of the patient were reviewed by me and considered in my medical decision making (see chart for details).    MDM Rules/Calculators/A&P                           Patient leukocytosis noted on CBC.  Urinalysis positive for infection.  Abdominal CT concerning for right-sided pyelonephritis.  Patient given Rocephin and will be admitted to the hospital Final Clinical Impression(s) /  ED Diagnoses Final diagnoses:  None    Rx / DC Orders ED Discharge Orders     None        Lacretia Leigh, MD 02/27/21 1339

## 2021-02-27 NOTE — ED Notes (Signed)
Pt's urine specimen sent to lab.

## 2021-02-27 NOTE — Progress Notes (Signed)
PHARMACY NOTE:  ANTIMICROBIAL RENAL DOSAGE ADJUSTMENT  Current antimicrobial regimen includes a mismatch between antimicrobial dosage and estimated renal function.  As per policy approved by the Pharmacy & Therapeutics and Medical Executive Committees, the antimicrobial dosage will be adjusted accordingly.  Current antimicrobial dosage:  Ceftriaxone 1g IV q24h  Indication: Sepsis, Pyelonephritis  Renal Function:  Estimated Creatinine Clearance: 22.3 mL/min (A) (by C-G formula based on SCr of 1.14 mg/dL (H)). []      On intermittent HD, scheduled: []      On CRRT    Antimicrobial dosage has been changed to:  Ceftriaxone 2g IV q24h  Additional comments:   Thank you for allowing pharmacy to be a part of this patient's care.  Gretta Arab PharmD, BCPS Clinical Pharmacist [facility] main pharmacy [phone] 02/27/2021 3:49 PM

## 2021-02-27 NOTE — Plan of Care (Signed)
  Problem: Clinical Measurements: Goal: Will remain free from infection Outcome: Progressing Goal: Diagnostic test results will improve Outcome: Progressing   Problem: Activity: Goal: Risk for activity intolerance will decrease Outcome: Progressing   Problem: Elimination: Goal: Will not experience complications related to urinary retention Outcome: Progressing

## 2021-02-27 NOTE — Progress Notes (Signed)
Notified by EDP of need for admission d/t sepsis secondary to pyelonephritis. [facility] accepts patient to progressive at [facility]. EDP is to remain responsible for orders/medical decisions while patient is holding at [facility]. Upon arrival to [facility], [facility] will assume care. Nursing staff will call patient placement to notify them of patient's arrival so that the proper [facility] member may receive the patient. Thank you.

## 2021-02-27 NOTE — ED Triage Notes (Addendum)
Daughter states pt has had N/V/D since Wednesday, she has been weak for a while, chills on and off. Burning upon urination, Elevated blood sugars, Insulin changed 3 weeks ago, This am CBG 179. Chronic left leg pain.

## 2021-02-28 DIAGNOSIS — B9620 Unspecified Escherichia coli [E. coli] as the cause of diseases classified elsewhere: Secondary | ICD-10-CM

## 2021-02-28 DIAGNOSIS — R7881 Bacteremia: Secondary | ICD-10-CM

## 2021-02-28 LAB — COMPREHENSIVE METABOLIC PANEL
ALT: 15 U/L (ref 0–44)
AST: 23 U/L (ref 15–41)
Albumin: 2.8 g/dL — ABNORMAL LOW (ref 3.5–5.0)
Alkaline Phosphatase: 64 U/L (ref 38–126)
Anion gap: 7 (ref 5–15)
BUN: 11 mg/dL (ref 8–23)
CO2: 27 mmol/L (ref 22–32)
Calcium: 8.8 mg/dL — ABNORMAL LOW (ref 8.9–10.3)
Chloride: 105 mmol/L (ref 98–111)
Creatinine, Ser: 0.86 mg/dL (ref 0.44–1.00)
GFR, Estimated: >60 mL/min (ref >60)
Glucose, Bld: 114 mg/dL — ABNORMAL HIGH (ref 70–99)
Potassium: 3.6 mmol/L (ref 3.5–5.1)
Sodium: 139 mmol/L (ref 135–145)
Total Bilirubin: 0.5 mg/dL (ref 0.3–1.2)
Total Protein: 6 g/dL — ABNORMAL LOW (ref 6.5–8.1)

## 2021-02-28 LAB — CBC
HCT: 31.5 % — ABNORMAL LOW (ref 36.0–46.0)
Hemoglobin: 10.2 g/dL — ABNORMAL LOW (ref 12.0–15.0)
MCH: 28.5 pg (ref 26.0–34.0)
MCHC: 32.4 g/dL (ref 30.0–36.0)
MCV: 88 fL (ref 80.0–100.0)
Platelets: 249 10*3/uL (ref 150–400)
RBC: 3.58 MIL/uL — ABNORMAL LOW (ref 3.87–5.11)
RDW: 13.2 % (ref 11.5–15.5)
WBC: 15 10*3/uL — ABNORMAL HIGH (ref 4.0–10.5)
nRBC: 0 % (ref 0.0–0.2)

## 2021-02-28 LAB — BLOOD CULTURE ID PANEL (REFLEXED) - BCID2

## 2021-02-28 LAB — GLUCOSE, CAPILLARY
Glucose-Capillary: 89 mg/dL (ref 70–99)
Glucose-Capillary: 275 mg/dL — ABNORMAL HIGH (ref 70–99)
Glucose-Capillary: 157 mg/dL — ABNORMAL HIGH (ref 70–99)
Glucose-Capillary: 288 mg/dL — ABNORMAL HIGH (ref 70–99)

## 2021-02-28 MED ORDER — METOPROLOL TARTRATE 25 MG PO TABS
12.5000 mg | ORAL_TABLET | Freq: Two times a day (BID) | ORAL | Status: DC
  Administered 2021-02-28 (×2): 12.5 mg via ORAL
  Filled 2021-02-28 (×2): qty 1

## 2021-02-28 MED ORDER — SIMVASTATIN 20 MG PO TABS
20.0000 mg | ORAL_TABLET | Freq: Every day | ORAL | Status: DC
  Administered 2021-02-28 – 2021-03-01 (×2): 20 mg via ORAL
  Filled 2021-02-28 (×2): qty 1

## 2021-02-28 MED ORDER — HEPARIN SODIUM (PORCINE) 5000 UNIT/ML IJ SOLN
5000.0000 [IU] | Freq: Two times a day (BID) | INTRAMUSCULAR | Status: DC
  Administered 2021-02-28 – 2021-03-03 (×6): 5000 [IU] via SUBCUTANEOUS
  Filled 2021-02-28 (×6): qty 1

## 2021-02-28 MED ORDER — LACTATED RINGERS IV BOLUS
500.0000 mL | Freq: Once | INTRAVENOUS | Status: AC
  Administered 2021-02-28: 500 mL via INTRAVENOUS

## 2021-02-28 MED ORDER — POTASSIUM CHLORIDE CRYS ER 20 MEQ PO TBCR
40.0000 meq | EXTENDED_RELEASE_TABLET | Freq: Once | ORAL | Status: AC
  Administered 2021-02-28: 40 meq via ORAL
  Filled 2021-02-28: qty 2

## 2021-02-28 MED ORDER — EZETIMIBE 10 MG PO TABS
10.0000 mg | ORAL_TABLET | Freq: Every day | ORAL | Status: DC
  Administered 2021-02-28 – 2021-03-01 (×2): 10 mg via ORAL
  Filled 2021-02-28 (×2): qty 1

## 2021-02-28 NOTE — Progress Notes (Signed)
PHARMACY - PHYSICIAN COMMUNICATION CRITICAL VALUE ALERT - BLOOD CULTURE IDENTIFICATION (BCID)  Hannah [name] is an 87 y.o. female who presented to [facility] on 02/27/2021 with a chief complaint of N/V and dysuria  Assessment:  Pt currently on antibiotics for UTI/pyelonephritis.  BCID + 3/4 E.coli, no resistance detected  Name of physician (or Provider) Contacted: Dr. Cruzita Lederer text paged  Current antibiotics: Ceftriaxone 2 g IV q24h  Changes to prescribed antibiotics recommended: None. Pt on appropriate antibiotics.   Results for orders placed or performed during the hospital encounter of 02/27/21  Blood Culture ID Panel (Reflexed) (Collected: 02/27/2021 12:25 PM)  Result Value Ref Range   Enterococcus faecalis NOT DETECTED NOT DETECTED   Enterococcus Faecium NOT DETECTED NOT DETECTED   Listeria monocytogenes NOT DETECTED NOT DETECTED   Staphylococcus species NOT DETECTED NOT DETECTED   Staphylococcus aureus (BCID) NOT DETECTED NOT DETECTED   Staphylococcus epidermidis NOT DETECTED NOT DETECTED   Staphylococcus lugdunensis NOT DETECTED NOT DETECTED   Streptococcus species NOT DETECTED NOT DETECTED   Streptococcus agalactiae NOT DETECTED NOT DETECTED   Streptococcus pneumoniae NOT DETECTED NOT DETECTED   Streptococcus pyogenes NOT DETECTED NOT DETECTED   A.calcoaceticus-baumannii NOT DETECTED NOT DETECTED   Bacteroides fragilis NOT DETECTED NOT DETECTED   Enterobacterales DETECTED (A) NOT DETECTED   Enterobacter cloacae complex NOT DETECTED NOT DETECTED   Escherichia coli DETECTED (A) NOT DETECTED   Klebsiella aerogenes NOT DETECTED NOT DETECTED   Klebsiella oxytoca NOT DETECTED NOT DETECTED   Klebsiella pneumoniae NOT DETECTED NOT DETECTED   Proteus species NOT DETECTED NOT DETECTED   Salmonella species NOT DETECTED NOT DETECTED   Serratia marcescens NOT DETECTED NOT DETECTED   Haemophilus influenzae NOT DETECTED NOT DETECTED   Neisseria meningitidis NOT DETECTED NOT  DETECTED   Pseudomonas aeruginosa NOT DETECTED NOT DETECTED   Stenotrophomonas maltophilia NOT DETECTED NOT DETECTED   Candida albicans NOT DETECTED NOT DETECTED   Candida auris NOT DETECTED NOT DETECTED   Candida glabrata NOT DETECTED NOT DETECTED   Candida krusei NOT DETECTED NOT DETECTED   Candida parapsilosis NOT DETECTED NOT DETECTED   Candida tropicalis NOT DETECTED NOT DETECTED   Cryptococcus neoformans/gattii NOT DETECTED NOT DETECTED   CTX-M ESBL NOT DETECTED NOT DETECTED   Carbapenem resistance IMP NOT DETECTED NOT DETECTED   Carbapenem resistance KPC NOT DETECTED NOT DETECTED   Carbapenem resistance NDM NOT DETECTED NOT DETECTED   Carbapenem resist OXA 48 LIKE NOT DETECTED NOT DETECTED   Carbapenem resistance VIM NOT DETECTED NOT DETECTED    Lenis Noon, PharmD 02/28/2021  6:13 AM

## 2021-02-28 NOTE — Progress Notes (Signed)
Heart rate sustains to 140's. BP is 101/59, patient is asymptomatic, no chest pain/palpitations, afebrile. Complains of slight headache relieved by tylenol.

## 2021-02-28 NOTE — Progress Notes (Signed)
PROGRESS NOTE  Hannah [name] TREAT QIO:[id number] DOB: 08/13/1932 DOA: 02/27/2021 PCP: Donald Prose, MD   LOS: 1 day   Brief Narrative / Interim history:  87 y.o. female with medical history significant of DM2, arthritis, HTN, hypothyroidism who comes to the hospital with complaints of nausea, vomiting, poor p.o. intake over the last 3 to 4 days.  She also has been complaining of lower abdominal discomfort as well as dysuria for the same amount of time.  She reports fevers at home.  She was found to be septic due to UTI/pyelonephritis, also found to have E. coli bacteremia and was admitted to the hospital  Subjective / 24h Interval events: Feeling a little bit better this morning, no chest pain, no shortness of breath.  Dysuria is better  Assessment & Plan: Principal Problem Sepsis due to urinary tract infection/pyelonephritis, E. coli bacteremia -Continue ceftriaxone, awaiting urine and blood cultures final speciation/sensitivities -She is normotensive this morning, WBC improving, she is starting to eat, hold further additional IV fluids   Active Problems Essential hypertension -hold home antihypertensives (amlodipine, chlorthalidone) agents in the setting of sepsis, she is normotensive this morning -She had a burst of SVT likely in the setting of acute illness.  Start low-dose metoprolol.  Chronic diastolic CHF-most recent 2D echo 2020 showed normal LVEF at 32%, grade 2 diastolic dysfunction.  Hold further fluids this morning.  Started on metoprolol as above   Type 2 diabetes mellitus-continue Lantus at a lower dose given poor p.o. intake, placed on sensitive sliding scale.  CBG (last 3)  Recent Labs    02/27/21 2134 02/28/21 0807 02/28/21 1135  GLUCAP 218* 89 275*     Hypothyroidism -continue home Synthroid  Hyperlipidemia -continue home medications   Chronic asthma-no wheezing, continue home medications   Chronic dyspnea on exertion-continue inhalers as above, recently worked  up by cardiology with a negative cardiac cath  History of oral cancer, left buccal carcinoma-back in 2013, resected and reconstructed, follows regularly with [facility]  Scheduled Meds:  ezetimibe  10 mg Oral Daily   And   simvastatin  20 mg Oral Daily   heparin injection (subcutaneous)  5,000 Units Subcutaneous Q12H   insulin aspart  0-9 Units Subcutaneous TID WC   insulin glargine-yfgn  8 Units Subcutaneous QHS   latanoprost  1 drop Both Eyes QHS   levothyroxine  25 mcg Oral QODAY   And   [START ON 03/01/2021] levothyroxine  50 mcg Oral QODAY   linagliptin  5 mg Oral Daily   megestrol  400 mg Oral Daily   metoprolol tartrate  12.5 mg Oral BID   mirabegron ER  25 mg Oral Daily   mometasone-formoterol  2 puff Inhalation BID   Continuous Infusions:  cefTRIAXone (ROCEPHIN)  IV     PRN Meds:.acetaminophen **OR** acetaminophen, albuterol, ondansetron **OR** ondansetron (ZOFRAN) IV  Diet Orders (From admission, onward)     Start     Ordered   02/27/21 1607  Diet vegetarian Room service appropriate? Yes; Fluid consistency: Thin  Diet effective now       Question Answer Comment  Room service appropriate? Yes   Fluid consistency: Thin      02/27/21 1606            DVT prophylaxis: heparin injection 5,000 Units Start: 02/28/21 2200     Code Status: Full Code  Family Communication: Daughter present at bedside  Status is: Inpatient  Remains inpatient appropriate because: Severity of illness, bacteremia, need for IV  antibiotics  Level of care: Progressive  Consultants:  None  Procedures:  None   Microbiology  E coli bacteremia   Antimicrobials: Ceftriaxone 10/14 >>    Objective: Vitals:   02/28/21 0231 02/28/21 0300 02/28/21 0834 02/28/21 1054  BP: (!) 101/59   (!) 134/54  Pulse: (!) 147 (!) 109    Resp: 20     Temp: 98.8 F (37.1 C)     TempSrc: Oral     SpO2:   100%   Weight:      Height:        Intake/Output Summary (Last 24 hours) at  02/28/2021 1136 Last data filed at 02/28/2021 0830 Gross per 24 hour  Intake 1959 ml  Output 200 ml  Net 1759 ml   Filed Weights   02/27/21 1024  Weight: 39.9 kg    Examination:  Constitutional: NAD, cachectic appearing Eyes: no scleral icterus ENMT: Mucous membranes are moist.  Neck: normal, supple Respiratory: clear to auscultation bilaterally, no wheezing, no crackles. Normal respiratory effort.  Cardiovascular: Regular rate and rhythm, no murmurs / rubs / gallops. No LE edema. Good peripheral pulses Abdomen: non distended, no tenderness. Bowel sounds positive.  Musculoskeletal: no clubbing / cyanosis.  Skin: no rashes Neurologic: CN 2-12 grossly intact. Strength 5/5 in all 4.   Data Reviewed: I have independently reviewed following labs and imaging studies   CBC: Recent Labs  Lab 02/27/21 1030 02/28/21 0401  WBC 20.7* 15.0*  HGB 12.7 10.2*  HCT 38.9 31.5*  MCV 87.0 88.0  PLT 294 836   Basic Metabolic Panel: Recent Labs  Lab 02/27/21 1030 02/28/21 0401  NA 138 139  K 4.3 3.6  CL 102 105  CO2 23 27  GLUCOSE 192* 114*  BUN 15 11  CREATININE 1.14* 0.86  CALCIUM 10.1 8.8*   Liver Function Tests: Recent Labs  Lab 02/27/21 1030 02/28/21 0401  AST 21 23  ALT 15 15  ALKPHOS 56 64  BILITOT 0.6 0.5  PROT 7.6 6.0*  ALBUMIN 4.1 2.8*   Coagulation Profile: No results for input(s): INR, PROTIME in the last 168 hours. HbA1C: No results for input(s): HGBA1C in the last 72 hours. CBG: Recent Labs  Lab 02/27/21 1026 02/27/21 2134 02/28/21 0807  GLUCAP 174* 218* 89    Recent Results (from the past 240 hour(s))  Culture, blood (Routine X 2) w Reflex to ID Panel     Status: None (Preliminary result)   Collection Time: 02/27/21 12:10 PM   Specimen: BLOOD  Result Value Ref Range Status   Specimen Description   Final    BLOOD LEFT ANTECUBITAL Performed at [facility], [address] [place], [place] [address]    Special Requests    Final    Blood Culture adequate volume BOTTLES DRAWN AEROBIC AND ANAEROBIC Performed at [facility], [address] [place], [place] [address]    Culture  Setup Time   Final    GRAM NEGATIVE RODS IN BOTH AEROBIC AND ANAEROBIC BOTTLES CRITICAL VALUE NOTED.  VALUE IS CONSISTENT WITH PREVIOUSLY REPORTED AND CALLED VALUE. Performed at [facility], [place] [address], [place], [place] [address]    Culture GRAM NEGATIVE RODS  Final   Report Status PENDING  Incomplete  Culture, blood (Routine X 2) w Reflex to ID Panel     Status: None (Preliminary result)   Collection Time: 02/27/21 12:25 PM   Specimen: BLOOD  Result Value Ref Range Status   Specimen Description  Final    BLOOD RIGHT ANTECUBITAL Performed at [facility], [address] [place], [place] [address]    Special Requests   Final    BOTTLES DRAWN AEROBIC AND ANAEROBIC Blood Culture adequate volume Performed at [facility], [address], [place], [place] [address]    Culture  Setup Time   Final    GRAM NEGATIVE RODS IN BOTH AEROBIC AND ANAEROBIC BOTTLES CRITICAL RESULT CALLED TO, READ BACK BY AND VERIFIED WITH: M SWAYNE,PHARMD@[id number]  02/28/21 [place] Performed at [facility], [place] [address] [place], [place] [address]    Culture GRAM NEGATIVE RODS  Final   Report Status PENDING  Incomplete  Blood Culture ID Panel (Reflexed)     Status: Abnormal   Collection Time: 02/27/21 12:25 PM  Result Value Ref Range Status   Enterococcus faecalis NOT DETECTED NOT DETECTED Final   Enterococcus Faecium NOT DETECTED NOT DETECTED Final   Listeria monocytogenes NOT DETECTED NOT DETECTED Final   Staphylococcus species NOT DETECTED NOT DETECTED Final   Staphylococcus aureus (BCID) NOT DETECTED NOT DETECTED Final   Staphylococcus epidermidis NOT DETECTED NOT DETECTED Final   Staphylococcus lugdunensis NOT DETECTED NOT DETECTED Final   Streptococcus species NOT DETECTED NOT  DETECTED Final   Streptococcus agalactiae NOT DETECTED NOT DETECTED Final   Streptococcus pneumoniae NOT DETECTED NOT DETECTED Final   Streptococcus pyogenes NOT DETECTED NOT DETECTED Final   A.calcoaceticus-baumannii NOT DETECTED NOT DETECTED Final   Bacteroides fragilis NOT DETECTED NOT DETECTED Final   Enterobacterales DETECTED (A) NOT DETECTED Final    Comment: Enterobacterales represent a large order of gram negative bacteria, not a single organism. CRITICAL RESULT CALLED TO, READ BACK BY AND VERIFIED WITH: M SWAYNE,PHARMD@[id number]  02/28/21 [place]    Enterobacter cloacae complex NOT DETECTED NOT DETECTED Final   Escherichia coli DETECTED (A) NOT DETECTED Final    Comment: CRITICAL RESULT CALLED TO, READ BACK BY AND VERIFIED WITH: M SWAYNE,PHARMD@0610  02/28/21 [place]    Klebsiella aerogenes NOT DETECTED NOT DETECTED Final   Klebsiella oxytoca NOT DETECTED NOT DETECTED Final   Klebsiella pneumoniae NOT DETECTED NOT DETECTED Final   Proteus species NOT DETECTED NOT DETECTED Final   Salmonella species NOT DETECTED NOT DETECTED Final   Serratia marcescens NOT DETECTED NOT DETECTED Final   Haemophilus influenzae NOT DETECTED NOT DETECTED Final   Neisseria meningitidis NOT DETECTED NOT DETECTED Final   Pseudomonas aeruginosa NOT DETECTED NOT DETECTED Final   Stenotrophomonas maltophilia NOT DETECTED NOT DETECTED Final   Candida albicans NOT DETECTED NOT DETECTED Final   Candida auris NOT DETECTED NOT DETECTED Final   Candida glabrata NOT DETECTED NOT DETECTED Final   Candida krusei NOT DETECTED NOT DETECTED Final   Candida parapsilosis NOT DETECTED NOT DETECTED Final   Candida tropicalis NOT DETECTED NOT DETECTED Final   Cryptococcus neoformans/gattii NOT DETECTED NOT DETECTED Final   CTX-M ESBL NOT DETECTED NOT DETECTED Final   Carbapenem resistance IMP NOT DETECTED NOT DETECTED Final   Carbapenem resistance KPC NOT DETECTED NOT DETECTED Final   Carbapenem resistance NDM NOT DETECTED NOT  DETECTED Final   Carbapenem resist OXA 48 LIKE NOT DETECTED NOT DETECTED Final   Carbapenem resistance VIM NOT DETECTED NOT DETECTED Final    Comment: Performed at [facility], [address] [place],  [address]  Resp Panel by RT-PCR (Flu A&B, Covid) Nasopharyngeal Swab     Status: None   Collection Time: 02/27/21  1:08 PM   Specimen: Nasopharyngeal Swab; Nasopharyngeal(NP) swabs  in vial transport medium  Result Value Ref Range Status   SARS Coronavirus 2 by RT PCR NEGATIVE NEGATIVE Final    Comment: (NOTE) SARS-CoV-2 target nucleic acids are NOT DETECTED.  The SARS-CoV-2 RNA is generally detectable in upper respiratory specimens during the acute phase of infection. The lowest concentration of SARS-CoV-2 viral copies this assay can detect is 138 copies/mL. A negative result does not preclude SARS-Cov-2 infection and should not be used as the sole basis for treatment or other patient management decisions. A negative result may occur with  improper specimen collection/handling, submission of specimen other than nasopharyngeal swab, presence of viral mutation(s) within the areas targeted by this assay, and inadequate number of viral copies(<138 copies/mL). A negative result must be combined with clinical observations, patient history, and epidemiological information. The expected result is Negative.  Fact Sheet for Patients:  EntrepreneurPulse.com.au  Fact Sheet for Healthcare Providers:  IncredibleEmployment.be  This test is no t yet approved or cleared by the [place] FDA and  has been authorized for detection and/or diagnosis of SARS-CoV-2 by FDA under an Emergency Use Authorization (EUA). This EUA will remain  in effect (meaning this test can be used) for the duration of the COVID-19 declaration under Section 564(b)(1) of the Act, 21 U.S.C.section 360bbb-3(b)(1), unless the authorization is terminated  or revoked sooner.        Influenza A by PCR NEGATIVE NEGATIVE Final   Influenza B by PCR NEGATIVE NEGATIVE Final    Comment: (NOTE) The Xpert Xpress SARS-CoV-2/FLU/RSV plus assay is intended as an aid in the diagnosis of influenza from Nasopharyngeal swab specimens and should not be used as a sole basis for treatment. Nasal washings and aspirates are unacceptable for Xpert Xpress SARS-CoV-2/FLU/RSV testing.  Fact Sheet for Patients: EntrepreneurPulse.com.au  Fact Sheet for Healthcare Providers: IncredibleEmployment.be  This test is not yet approved or cleared by the [place] FDA and has been authorized for detection and/or diagnosis of SARS-CoV-2 by FDA under an Emergency Use Authorization (EUA). This EUA will remain in effect (meaning this test can be used) for the duration of the COVID-19 declaration under Section 564(b)(1) of the Act, 21 U.S.C. section 360bbb-3(b)(1), unless the authorization is terminated or revoked.  Performed at [facility], [address] [place], [place] [address]      Radiology Studies: CT Abdomen Pelvis W Contrast  Result Date: 02/27/2021 CLINICAL DATA:  Abdominal abscess/infection suspected EXAM: CT ABDOMEN AND PELVIS WITH CONTRAST TECHNIQUE: Multidetector CT imaging of the abdomen and pelvis was performed using the standard protocol following bolus administration of intravenous contrast. CONTRAST:  154mL OMNIPAQUE IOHEXOL 300 MG/ML  SOLN COMPARISON:  September 2021 FINDINGS: Lower chest: No acute abnormality. Hepatobiliary: No focal liver abnormality is seen. No gallstones, gallbladder wall thickening, or biliary dilatation. Pancreas: Atrophic.  Otherwise unremarkable. Spleen: Unremarkable. Adrenals/Urinary Tract: Adrenals are unremarkable. There is heterogeneous, decreased enhancement at the lower pole the right kidney. Partially distended bladder with wall thickening. Urothelial enhancement noted. No  calculi. Stomach/Bowel: Small hiatal hernia. Stomach is otherwise unremarkable. Bowel is normal in caliber. Minimal distal colonic diverticulosis. Vascular/Lymphatic: Aortic atherosclerosis. No enlarged lymph nodes. Reproductive: Uterus and bilateral adnexa are unremarkable. Other: No free fluid.  Abdominal wall is unremarkable. Musculoskeletal: Degenerative changes of the included spine. No acute osseous abnormality. IMPRESSION: Findings suspicious for cystitis with ascending urinary tract infection and right pyelonephritis. No calculi. Aortic atherosclerosis. Minimal colonic diverticulosis. Electronically Signed   By: Macy Mis M.D.   On: 02/27/2021 13:20  DG Chest Port 1 View  Result Date: 02/27/2021 CLINICAL DATA:  Cough EXAM: PORTABLE CHEST 1 VIEW COMPARISON:  07/14/2018 FINDINGS: Cardiomediastinal contours within normal limits. Atherosclerotic calcification of the aortic knob. Coarsened interstitial markings bilaterally. No focal airspace consolidation, pleural effusion, or pneumothorax. IMPRESSION: Coarsened interstitial markings bilaterally, which may reflect bronchitic type lung changes. No focal airspace consolidation. Electronically Signed   By: Davina Poke D.O.   On: 02/27/2021 13:12     Marzetta Board, MD, PhD [facility]  Between 7 am - 7 pm I am available, please contact me via Amion (for emergencies) or Securechat (non urgent messages)  Between 7 pm - 7 am I am not available, please contact night coverage MD/APP via Amion

## 2021-03-01 ENCOUNTER — Inpatient Hospital Stay (HOSPITAL_COMMUNITY): Payer: Medicare Other

## 2021-03-01 DIAGNOSIS — E039 Hypothyroidism, unspecified: Secondary | ICD-10-CM

## 2021-03-01 DIAGNOSIS — I471 Supraventricular tachycardia: Secondary | ICD-10-CM

## 2021-03-01 DIAGNOSIS — R9431 Abnormal electrocardiogram [ECG] [EKG]: Secondary | ICD-10-CM

## 2021-03-01 LAB — ECHOCARDIOGRAM COMPLETE
AR max vel: 1.46 cm2
AV Area VTI: 1.55 cm2
AV Area mean vel: 1.35 cm2
AV Mean grad: 4 mmHg
AV Peak grad: 7.2 mmHg
Ao pk vel: 1.34 m/s
Area-P 1/2: 4.89 cm2
Height: 62 in
S' Lateral: 2.3 cm
Weight: 1408 oz

## 2021-03-01 LAB — COMPREHENSIVE METABOLIC PANEL
ALT: 20 U/L (ref 0–44)
AST: 28 U/L (ref 15–41)
Albumin: 2.8 g/dL — ABNORMAL LOW (ref 3.5–5.0)
Alkaline Phosphatase: 52 U/L (ref 38–126)
Anion gap: 9 (ref 5–15)
BUN: 7 mg/dL — ABNORMAL LOW (ref 8–23)
CO2: 21 mmol/L — ABNORMAL LOW (ref 22–32)
Calcium: 8.7 mg/dL — ABNORMAL LOW (ref 8.9–10.3)
Chloride: 104 mmol/L (ref 98–111)
Creatinine, Ser: 0.87 mg/dL (ref 0.44–1.00)
GFR, Estimated: >60 mL/min (ref >60)
Glucose, Bld: 192 mg/dL — ABNORMAL HIGH (ref 70–99)
Potassium: 3.8 mmol/L (ref 3.5–5.1)
Sodium: 134 mmol/L — ABNORMAL LOW (ref 135–145)
Total Bilirubin: 0.3 mg/dL (ref 0.3–1.2)
Total Protein: 6.5 g/dL (ref 6.5–8.1)

## 2021-03-01 LAB — MAGNESIUM: Magnesium: 1.6 mg/dL — ABNORMAL LOW (ref 1.7–2.4)

## 2021-03-01 LAB — GLUCOSE, CAPILLARY
Glucose-Capillary: 147 mg/dL — ABNORMAL HIGH (ref 70–99)
Glucose-Capillary: 256 mg/dL — ABNORMAL HIGH (ref 70–99)
Glucose-Capillary: 154 mg/dL — ABNORMAL HIGH (ref 70–99)
Glucose-Capillary: 249 mg/dL — ABNORMAL HIGH (ref 70–99)

## 2021-03-01 LAB — CBC
HCT: 32.6 % — ABNORMAL LOW (ref 36.0–46.0)
Hemoglobin: 10.9 g/dL — ABNORMAL LOW (ref 12.0–15.0)
MCH: 28.9 pg (ref 26.0–34.0)
MCHC: 33.4 g/dL (ref 30.0–36.0)
MCV: 86.5 fL (ref 80.0–100.0)
Platelets: 256 10*3/uL (ref 150–400)
RBC: 3.77 MIL/uL — ABNORMAL LOW (ref 3.87–5.11)
RDW: 13.2 % (ref 11.5–15.5)
WBC: 11.4 10*3/uL — ABNORMAL HIGH (ref 4.0–10.5)
nRBC: 0 % (ref 0.0–0.2)

## 2021-03-01 LAB — TSH: TSH: 0.663 u[IU]/mL (ref 0.350–4.500)

## 2021-03-01 MED ORDER — METOPROLOL TARTRATE 25 MG PO TABS
25.0000 mg | ORAL_TABLET | Freq: Two times a day (BID) | ORAL | Status: DC
  Administered 2021-03-01 – 2021-03-03 (×5): 25 mg via ORAL
  Filled 2021-03-01 (×5): qty 1

## 2021-03-01 MED ORDER — MAGNESIUM SULFATE 2 GM/50ML IV SOLN
2.0000 g | Freq: Once | INTRAVENOUS | Status: AC
  Administered 2021-03-01: 2 g via INTRAVENOUS
  Filled 2021-03-01: qty 50

## 2021-03-01 NOTE — Progress Notes (Signed)
PROGRESS NOTE  Hannah [name] TIR:[id number] DOB: 10-12-1932 DOA: 02/27/2021 PCP: Donald Prose, MD   LOS: 2 days   Brief Narrative / Interim history:  87 y.o. female with medical history significant of DM2, arthritis, HTN, hypothyroidism who comes to the hospital with complaints of nausea, vomiting, poor p.o. intake over the last 3 to 4 days.  She also has been complaining of lower abdominal discomfort as well as dysuria for the same amount of time.  She reports fevers at home.  She was found to be septic due to UTI/pyelonephritis, also found to have E. coli bacteremia and was admitted to the hospital  Subjective / 24h Interval events: Continues to feel weak.  No chest pain, no shortness of breath.  Denies any palpitations  Assessment & Plan: Principal Problem Sepsis due to urinary tract infection/pyelonephritis, E. coli bacteremia -Continue ceftriaxone, awaiting urine and blood cultures final speciation/sensitivities -She is normotensive, white count appears to be improving, monitor cultures   Active Problems SVT-she has had several rounds of short SVTs over the last 24 hours.  Continue metoprolol, increase the dose to 25 mg twice daily today with holding parameters -Replete magnesium, obtain a 2D echo.  Hypomagnesemia-replete  Essential hypertension -hold home antihypertensives (amlodipine, chlorthalidone) agents in the setting of sepsis, she is normotensive this morning -She had a burst of SVT likely in the setting of acute illness.  Started on metoprolol  Chronic diastolic CHF-most recent 2D echo 2020 showed normal LVEF at 67%, grade 2 diastolic dysfunction.   -Given SVT repeat echo   Type 2 diabetes mellitus-continue Lantus at a lower dose given poor p.o. intake, placed on sensitive sliding scale.  CBG (last 3)  Recent Labs    02/28/21 1646 02/28/21 2052 03/01/21 0759  GLUCAP 157* 288* 147*    Hypothyroidism -continue home Synthroid.  Check TSH given  SVTs  Hyperlipidemia -continue home medications   Chronic asthma-no wheezing, continue home medications   Chronic dyspnea on exertion-continue inhalers as above, recently worked up by cardiology with a negative cardiac cath  History of oral cancer, left buccal carcinoma-back in 2013, resected and reconstructed, follows regularly with [facility]  Scheduled Meds:  ezetimibe  10 mg Oral Daily   And   simvastatin  20 mg Oral Daily   heparin injection (subcutaneous)  5,000 Units Subcutaneous Q12H   insulin aspart  0-9 Units Subcutaneous TID WC   insulin glargine-yfgn  8 Units Subcutaneous QHS   latanoprost  1 drop Both Eyes QHS   levothyroxine  25 mcg Oral QODAY   And   levothyroxine  50 mcg Oral QODAY   linagliptin  5 mg Oral Daily   megestrol  400 mg Oral Daily   metoprolol tartrate  25 mg Oral BID   mirabegron ER  25 mg Oral Daily   mometasone-formoterol  2 puff Inhalation BID   Continuous Infusions:  cefTRIAXone (ROCEPHIN)  IV 2 g (02/28/21 1331)   PRN Meds:.acetaminophen **OR** acetaminophen, albuterol, ondansetron **OR** ondansetron (ZOFRAN) IV  Diet Orders (From admission, onward)     Start     Ordered   02/27/21 1607  Diet vegetarian Room service appropriate? Yes; Fluid consistency: Thin  Diet effective now       Question Answer Comment  Room service appropriate? Yes   Fluid consistency: Thin      02/27/21 1606            DVT prophylaxis: heparin injection 5,000 Units Start: 02/28/21 2200     Code  Status: Full Code  Family Communication: Daughter present at bedside  Status is: Inpatient  Remains inpatient appropriate because: Severity of illness, bacteremia, need for IV antibiotics  Level of care: Progressive  Consultants:  None  Procedures:  None   Microbiology  E coli bacteremia   Antimicrobials: Ceftriaxone 10/14 >>    Objective: Vitals:   03/01/21 0236 03/01/21 0313 03/01/21 0400 03/01/21 0819  BP:  (!) 144/79    Pulse:  (!)  107    Resp: (!) 34 20    Temp:  100.1 F (37.8 C) 99.7 F (37.6 C)   TempSrc:  Oral    SpO2:  98%  98%  Weight:      Height:        Intake/Output Summary (Last 24 hours) at 03/01/2021 1024 Last data filed at 03/01/2021 2376 Gross per 24 hour  Intake 580 ml  Output 1275 ml  Net -695 ml    Filed Weights   02/27/21 1024  Weight: 39.9 kg    Examination:  Constitutional: No distress, Eyes: No scleral icterus ENMT: mmm Neck: normal, supple Respiratory: Clear bilaterally, no wheezing, no crackles, normal respiratory effort Cardiovascular: Regular rate and rhythm, no murmurs, no peripheral edema Abdomen: Soft, NT, ND, bowel sounds positive Musculoskeletal: no clubbing / cyanosis.  Skin: No rashes seen Neurologic: Nonfocal, equal strength  Data Reviewed: I have independently reviewed following labs and imaging studies   CBC: Recent Labs  Lab 02/27/21 1030 02/28/21 0401 03/01/21 0328  WBC 20.7* 15.0* 11.4*  HGB 12.7 10.2* 10.9*  HCT 38.9 31.5* 32.6*  MCV 87.0 88.0 86.5  PLT 294 249 283    Basic Metabolic Panel: Recent Labs  Lab 02/27/21 1030 02/28/21 0401 03/01/21 0328  NA 138 139 134*  K 4.3 3.6 3.8  CL 102 105 104  CO2 23 27 21*  GLUCOSE 192* 114* 192*  BUN 15 11 7*  CREATININE 1.14* 0.86 0.87  CALCIUM 10.1 8.8* 8.7*  MG  --   --  1.6*    Liver Function Tests: Recent Labs  Lab 02/27/21 1030 02/28/21 0401 03/01/21 0328  AST 21 23 28   ALT 15 15 20   ALKPHOS 56 64 52  BILITOT 0.6 0.5 0.3  PROT 7.6 6.0* 6.5  ALBUMIN 4.1 2.8* 2.8*    Coagulation Profile: No results for input(s): INR, PROTIME in the last 168 hours. HbA1C: No results for input(s): HGBA1C in the last 72 hours. CBG: Recent Labs  Lab 02/28/21 0807 02/28/21 1135 02/28/21 1646 02/28/21 2052 03/01/21 0759  GLUCAP 89 275* 157* 288* 147*     Recent Results (from the past 240 hour(s))  Culture, blood (Routine X 2) w Reflex to ID Panel     Status: None (Preliminary result)    Collection Time: 02/27/21 12:10 PM   Specimen: BLOOD  Result Value Ref Range Status   Specimen Description   Final    BLOOD LEFT ANTECUBITAL Performed at [facility], [address] [place], [place] [address]    Special Requests   Final    Blood Culture adequate volume BOTTLES DRAWN AEROBIC AND ANAEROBIC Performed at [facility], [address], [place], [place] [address]    Culture  Setup Time   Final    GRAM NEGATIVE RODS IN BOTH AEROBIC AND ANAEROBIC BOTTLES CRITICAL VALUE NOTED.  VALUE IS CONSISTENT WITH PREVIOUSLY REPORTED AND CALLED VALUE.    Culture   Final    GRAM NEGATIVE RODS CULTURE REINCUBATED FOR BETTER GROWTH Performed at  [facility], [place] [address] [place], [place] [address]    Report Status PENDING  Incomplete  Culture, blood (Routine X 2) w Reflex to ID Panel     Status: Abnormal (Preliminary result)   Collection Time: 02/27/21 12:25 PM   Specimen: BLOOD  Result Value Ref Range Status   Specimen Description   Final    BLOOD RIGHT ANTECUBITAL Performed at [facility], [address] [place], [place] [address]    Special Requests   Final    BOTTLES DRAWN AEROBIC AND ANAEROBIC Blood Culture adequate volume Performed at [facility], [address] [place], [place] [address]    Culture  Setup Time   Final    GRAM NEGATIVE RODS IN BOTH AEROBIC AND ANAEROBIC BOTTLES CRITICAL RESULT CALLED TO, READ BACK BY AND VERIFIED WITH: M SWAYNE,[email or web address]@[id number]  02/28/21 [place]    Culture (A)  Final    ESCHERICHIA COLI SUSCEPTIBILITIES TO FOLLOW Performed at [facility], [place] [address], [place], [place] [address]    Report Status PENDING  Incomplete  Blood Culture ID Panel (Reflexed)     Status: Abnormal   Collection Time: 02/27/21 12:25 PM  Result Value Ref Range Status   Enterococcus faecalis NOT DETECTED NOT DETECTED Final   Enterococcus Faecium NOT DETECTED NOT DETECTED Final    Listeria monocytogenes NOT DETECTED NOT DETECTED Final   Staphylococcus species NOT DETECTED NOT DETECTED Final   Staphylococcus aureus (BCID) NOT DETECTED NOT DETECTED Final   Staphylococcus epidermidis NOT DETECTED NOT DETECTED Final   Staphylococcus lugdunensis NOT DETECTED NOT DETECTED Final   Streptococcus species NOT DETECTED NOT DETECTED Final   Streptococcus agalactiae NOT DETECTED NOT DETECTED Final   Streptococcus pneumoniae NOT DETECTED NOT DETECTED Final   Streptococcus pyogenes NOT DETECTED NOT DETECTED Final   A.calcoaceticus-baumannii NOT DETECTED NOT DETECTED Final   Bacteroides fragilis NOT DETECTED NOT DETECTED Final   Enterobacterales DETECTED (A) NOT DETECTED Final    Comment: Enterobacterales represent a large order of gram negative bacteria, not a single organism. CRITICAL RESULT CALLED TO, READ BACK BY AND VERIFIED WITH: M SWAYNE,PHARMD@0612  02/28/21 [place]    Enterobacter cloacae complex NOT DETECTED NOT DETECTED Final   Escherichia coli DETECTED (A) NOT DETECTED Final    Comment: CRITICAL RESULT CALLED TO, READ BACK BY AND VERIFIED WITH: M SWAYNE,PHARMD@0610  02/28/21 [place]    Klebsiella aerogenes NOT DETECTED NOT DETECTED Final   Klebsiella oxytoca NOT DETECTED NOT DETECTED Final   Klebsiella pneumoniae NOT DETECTED NOT DETECTED Final   Proteus species NOT DETECTED NOT DETECTED Final   Salmonella species NOT DETECTED NOT DETECTED Final   Serratia marcescens NOT DETECTED NOT DETECTED Final   Haemophilus influenzae NOT DETECTED NOT DETECTED Final   Neisseria meningitidis NOT DETECTED NOT DETECTED Final   Pseudomonas aeruginosa NOT DETECTED NOT DETECTED Final   Stenotrophomonas maltophilia NOT DETECTED NOT DETECTED Final   Candida albicans NOT DETECTED NOT DETECTED Final   Candida auris NOT DETECTED NOT DETECTED Final   Candida glabrata NOT DETECTED NOT DETECTED Final   Candida krusei NOT DETECTED NOT DETECTED Final   Candida parapsilosis NOT DETECTED NOT DETECTED  Final   Candida tropicalis NOT DETECTED NOT DETECTED Final   Cryptococcus neoformans/gattii NOT DETECTED NOT DETECTED Final   CTX-M ESBL NOT DETECTED NOT DETECTED Final   Carbapenem resistance IMP NOT DETECTED NOT DETECTED Final   Carbapenem resistance KPC NOT DETECTED NOT DETECTED Final   Carbapenem resistance NDM NOT DETECTED NOT DETECTED Final  Carbapenem resist OXA 48 LIKE NOT DETECTED NOT DETECTED Final   Carbapenem resistance VIM NOT DETECTED NOT DETECTED Final    Comment: Performed at [facility], [place] [address], [place], [place] [address]  Resp Panel by RT-PCR (Flu A&B, Covid) Nasopharyngeal Swab     Status: None   Collection Time: 02/27/21  1:08 PM   Specimen: Nasopharyngeal Swab; Nasopharyngeal(NP) swabs in vial transport medium  Result Value Ref Range Status   SARS Coronavirus 2 by RT PCR NEGATIVE NEGATIVE Final    Comment: (NOTE) SARS-CoV-2 target nucleic acids are NOT DETECTED.  The SARS-CoV-2 RNA is generally detectable in upper respiratory specimens during the acute phase of infection. The lowest concentration of SARS-CoV-2 viral copies this assay can detect is 138 copies/mL. A negative result does not preclude SARS-Cov-2 infection and should not be used as the sole basis for treatment or other patient management decisions. A negative result may occur with  improper specimen collection/handling, submission of specimen other than nasopharyngeal swab, presence of viral mutation(s) within the areas targeted by this assay, and inadequate number of viral copies(<138 copies/mL). A negative result must be combined with clinical observations, patient history, and epidemiological information. The expected result is Negative.  Fact Sheet for Patients:  EntrepreneurPulse.com.au  Fact Sheet for Healthcare Providers:  IncredibleEmployment.be  This test is no t yet approved or cleared by the [place] FDA and  has been authorized  for detection and/or diagnosis of SARS-CoV-2 by FDA under an Emergency Use Authorization (EUA). This EUA will remain  in effect (meaning this test can be used) for the duration of the COVID-19 declaration under Section 564(b)(1) of the Act, 21 U.S.C.section 360bbb-3(b)(1), unless the authorization is terminated  or revoked sooner.       Influenza A by PCR NEGATIVE NEGATIVE Final   Influenza B by PCR NEGATIVE NEGATIVE Final    Comment: (NOTE) The Xpert Xpress SARS-CoV-2/FLU/RSV plus assay is intended as an aid in the diagnosis of influenza from Nasopharyngeal swab specimens and should not be used as a sole basis for treatment. Nasal washings and aspirates are unacceptable for Xpert Xpress SARS-CoV-2/FLU/RSV testing.  Fact Sheet for Patients: EntrepreneurPulse.com.au  Fact Sheet for Healthcare Providers: IncredibleEmployment.be  This test is not yet approved or cleared by the [place] FDA and has been authorized for detection and/or diagnosis of SARS-CoV-2 by FDA under an Emergency Use Authorization (EUA). This EUA will remain in effect (meaning this test can be used) for the duration of the COVID-19 declaration under Section 564(b)(1) of the Act, 21 U.S.C. section 360bbb-3(b)(1), unless the authorization is terminated or revoked.  Performed at [facility], [address] [place], [place] [address]       Radiology Studies: No results found.   Marzetta Board, MD, PhD [facility]  Between 7 am - 7 pm I am available, please contact me via Amion (for emergencies) or Securechat (non urgent messages)  Between 7 pm - 7 am I am not available, please contact night coverage MD/APP via Amion

## 2021-03-01 NOTE — Progress Notes (Signed)
  Echocardiogram 2D Echocardiogram has been performed.  Hannah [name] F 03/01/2021, 12:31 PM

## 2021-03-02 LAB — CBC
HCT: 33.5 % — ABNORMAL LOW (ref 36.0–46.0)
Hemoglobin: 10.9 g/dL — ABNORMAL LOW (ref 12.0–15.0)
MCH: 28.5 pg (ref 26.0–34.0)
MCHC: 32.5 g/dL (ref 30.0–36.0)
MCV: 87.7 fL (ref 80.0–100.0)
Platelets: 285 10*3/uL (ref 150–400)
RBC: 3.82 MIL/uL — ABNORMAL LOW (ref 3.87–5.11)
RDW: 13.2 % (ref 11.5–15.5)
WBC: 11 10*3/uL — ABNORMAL HIGH (ref 4.0–10.5)
nRBC: 0 % (ref 0.0–0.2)

## 2021-03-02 LAB — CULTURE, BLOOD (ROUTINE X 2)
Special Requests: ADEQUATE
Special Requests: ADEQUATE

## 2021-03-02 LAB — GLUCOSE, CAPILLARY
Glucose-Capillary: 144 mg/dL — ABNORMAL HIGH (ref 70–99)
Glucose-Capillary: 155 mg/dL — ABNORMAL HIGH (ref 70–99)
Glucose-Capillary: 256 mg/dL — ABNORMAL HIGH (ref 70–99)
Glucose-Capillary: 110 mg/dL — ABNORMAL HIGH (ref 70–99)

## 2021-03-02 LAB — BASIC METABOLIC PANEL
Anion gap: 9 (ref 5–15)
BUN: 7 mg/dL — ABNORMAL LOW (ref 8–23)
CO2: 24 mmol/L (ref 22–32)
Calcium: 8.6 mg/dL — ABNORMAL LOW (ref 8.9–10.3)
Chloride: 103 mmol/L (ref 98–111)
Creatinine, Ser: 0.73 mg/dL (ref 0.44–1.00)
GFR, Estimated: >60 mL/min (ref >60)
Glucose, Bld: 147 mg/dL — ABNORMAL HIGH (ref 70–99)
Potassium: 3.3 mmol/L — ABNORMAL LOW (ref 3.5–5.1)
Sodium: 136 mmol/L (ref 135–145)

## 2021-03-02 LAB — URINE CULTURE: Culture: >=100000 — AB

## 2021-03-02 LAB — MAGNESIUM: Magnesium: 1.9 mg/dL (ref 1.7–2.4)

## 2021-03-02 MED ORDER — EZETIMIBE 10 MG PO TABS
10.0000 mg | ORAL_TABLET | Freq: Every day | ORAL | Status: DC
  Administered 2021-03-02 – 2021-03-03 (×2): 10 mg via ORAL
  Filled 2021-03-02 (×2): qty 1

## 2021-03-02 MED ORDER — CEFADROXIL 500 MG PO CAPS
1000.0000 mg | ORAL_CAPSULE | Freq: Two times a day (BID) | ORAL | Status: DC
  Administered 2021-03-02 – 2021-03-03 (×3): 1000 mg via ORAL
  Filled 2021-03-02 (×4): qty 2

## 2021-03-02 MED ORDER — LEVOTHYROXINE SODIUM 25 MCG PO TABS
25.0000 ug | ORAL_TABLET | Freq: Every day | ORAL | Status: DC
  Administered 2021-03-03: 25 ug via ORAL
  Filled 2021-03-02: qty 1

## 2021-03-02 MED ORDER — ATORVASTATIN CALCIUM 10 MG PO TABS
10.0000 mg | ORAL_TABLET | Freq: Every day | ORAL | Status: DC
  Administered 2021-03-02 – 2021-03-03 (×2): 10 mg via ORAL
  Filled 2021-03-02 (×2): qty 1

## 2021-03-02 MED ORDER — DILTIAZEM HCL ER COATED BEADS 120 MG PO CP24
120.0000 mg | ORAL_CAPSULE | Freq: Every day | ORAL | Status: DC
  Administered 2021-03-02 – 2021-03-03 (×2): 120 mg via ORAL
  Filled 2021-03-02 (×2): qty 1

## 2021-03-02 MED ORDER — POTASSIUM CHLORIDE CRYS ER 20 MEQ PO TBCR
40.0000 meq | EXTENDED_RELEASE_TABLET | Freq: Once | ORAL | Status: AC
  Administered 2021-03-02: 40 meq via ORAL
  Filled 2021-03-02: qty 2

## 2021-03-02 NOTE — Progress Notes (Signed)
PROGRESS NOTE  Hannah [name] YIF:[id number] DOB: 1932-09-23 DOA: 02/27/2021 PCP: Donald Prose, MD   LOS: 3 days   Brief Narrative / Interim history:  87 y.o. female with medical history significant of DM2, arthritis, HTN, hypothyroidism who comes to the hospital with complaints of nausea, vomiting, poor p.o. intake over the last 3 to 4 days.  She also has been complaining of lower abdominal discomfort as well as dysuria for the same amount of time.  She reports fevers at home.  She was found to be septic due to UTI/pyelonephritis, also found to have E. coli bacteremia and was admitted to the hospital  Subjective / 24h Interval events: Feels better, stronger today.  Appetite is better  Assessment & Plan: Principal Problem Sepsis due to urinary tract infection/pyelonephritis, E. coli bacteremia -She was maintained on ceftriaxone, sensitivities returned this morning and is sensitive to cefazolin, transition to cefadroxil. -Plan for total of 10-day course   Active Problems SVT-on telemetry she had several bursts of SVT initially, she was placed on metoprolol 12.5 twice daily, increased yesterday to 25 twice daily.  Still has persistent SVT occasionally, with rates up into the 150s.  2D echo was done but read is pending.  Due to persistent runs of SVT cardiology consulted and will see later today.  We will add on diltiazem per the recommendation  Hypomagnesemia-repleted, now within acceptable parameters  Hypokalemia-replete potassium today  Essential hypertension -hold home antihypertensives (amlodipine, chlorthalidone) agents in the setting of sepsis, she is normotensive this morning -On metoprolol and diltiazem as above  Chronic diastolic CHF-most recent 2D echo 2020 showed normal LVEF at 86%, grade 2 diastolic dysfunction.   -Repeat 2D echo pending   Type 2 diabetes mellitus-continue Lantus, sliding scale  CBG (last 3)  Recent Labs    03/01/21 1654 03/01/21 2145 03/02/21 0732   GLUCAP 154* 249* 144*    Hypothyroidism -continue home Synthroid.  TSH was 0.66, within normal parameters but on the low side.  She is taking Synthroid 25 alternating with 50 mcg every other day.  Would favor to decrease to 25 MCG daily to allow TSH to be a little bit higher closer to 5.  Recommend repeat TSH in 3 weeks as an outpatient  Hyperlipidemia -continue home medications   Chronic asthma-no wheezing, continue home medications   Chronic dyspnea on exertion-continue inhalers as above, recently worked up by cardiology with a negative cardiac cath  History of oral cancer, left buccal carcinoma-back in 2013, resected and reconstructed, follows regularly with [facility]  Scheduled Meds:  ezetimibe  10 mg Oral Daily   And   atorvastatin  10 mg Oral Daily   diltiazem  120 mg Oral Daily   heparin injection (subcutaneous)  5,000 Units Subcutaneous Q12H   insulin aspart  0-9 Units Subcutaneous TID WC   insulin glargine-yfgn  8 Units Subcutaneous QHS   latanoprost  1 drop Both Eyes QHS   levothyroxine  25 mcg Oral QODAY   And   levothyroxine  50 mcg Oral QODAY   linagliptin  5 mg Oral Daily   megestrol  400 mg Oral Daily   metoprolol tartrate  25 mg Oral BID   mirabegron ER  25 mg Oral Daily   mometasone-formoterol  2 puff Inhalation BID   Continuous Infusions:  cefTRIAXone (ROCEPHIN)  IV 2 g (03/01/21 1323)   PRN Meds:.acetaminophen **OR** acetaminophen, albuterol, ondansetron **OR** ondansetron (ZOFRAN) IV  Diet Orders (From admission, onward)     Start  Ordered   02/27/21 1607  Diet vegetarian Room service appropriate? Yes; Fluid consistency: Thin  Diet effective now       Question Answer Comment  Room service appropriate? Yes   Fluid consistency: Thin      02/27/21 1606            DVT prophylaxis: heparin injection 5,000 Units Start: 02/28/21 2200     Code Status: Full Code  Family Communication: Daughter present at bedside  Status is:  Inpatient  Remains inpatient appropriate because: Severity of illness, bacteremia, need for IV antibiotics  Level of care: Progressive  Consultants:  None  Procedures:  None   Microbiology  E coli bacteremia   Antimicrobials: Ceftriaxone 10/14 >>    Objective: Vitals:   03/01/21 2138 03/01/21 2200 03/02/21 0524 03/02/21 0844  BP: 127/79  130/67   Pulse: (!) 102  83   Resp:  (!) 23 20   Temp:   98.5 F (36.9 C)   TempSrc:   Oral   SpO2:   97% 97%  Weight:      Height:        Intake/Output Summary (Last 24 hours) at 03/02/2021 1057 Last data filed at 03/02/2021 0710 Gross per 24 hour  Intake 400 ml  Output 700 ml  Net -300 ml    Filed Weights   02/27/21 1024  Weight: 39.9 kg    Examination:  Constitutional: No distress, in bed Eyes: No scleral icterus ENMT: Moist mucous membranes Neck: normal, supple Respiratory: Clear bilaterally, no wheezing Cardiovascular: Regular rate and rhythm, no murmurs, no peripheral edema Abdomen: Abdomen is soft, nontender, nondistended, bowel sounds positive Musculoskeletal: no clubbing / cyanosis.  Skin: No skin rashes appreciated Neurologic: No focal deficits, equal strength  Data Reviewed: I have independently reviewed following labs and imaging studies   CBC: Recent Labs  Lab 02/27/21 1030 02/28/21 0401 03/01/21 0328 03/02/21 0413  WBC 20.7* 15.0* 11.4* 11.0*  HGB 12.7 10.2* 10.9* 10.9*  HCT 38.9 31.5* 32.6* 33.5*  MCV 87.0 88.0 86.5 87.7  PLT 294 249 256 361    Basic Metabolic Panel: Recent Labs  Lab 02/27/21 1030 02/28/21 0401 03/01/21 0328 03/02/21 0413  NA 138 139 134* 136  K 4.3 3.6 3.8 3.3*  CL 102 105 104 103  CO2 23 27 21* 24  GLUCOSE 192* 114* 192* 147*  BUN 15 11 7* 7*  CREATININE 1.14* 0.86 0.87 0.73  CALCIUM 10.1 8.8* 8.7* 8.6*  MG  --   --  1.6* 1.9    Liver Function Tests: Recent Labs  Lab 02/27/21 1030 02/28/21 0401 03/01/21 0328  AST 21 23 28   ALT 15 15 20   ALKPHOS 56 64  52  BILITOT 0.6 0.5 0.3  PROT 7.6 6.0* 6.5  ALBUMIN 4.1 2.8* 2.8*    Coagulation Profile: No results for input(s): INR, PROTIME in the last 168 hours. HbA1C: No results for input(s): HGBA1C in the last 72 hours. CBG: Recent Labs  Lab 03/01/21 0759 03/01/21 1139 03/01/21 1654 03/01/21 2145 03/02/21 0732  GLUCAP 147* 256* 154* 249* 144*     Recent Results (from the past 240 hour(s))  Urine Culture     Status: Abnormal   Collection Time: 02/27/21 11:15 AM   Specimen: Urine, Clean Catch  Result Value Ref Range Status   Specimen Description   Final    URINE, CLEAN CATCH Performed at [facility], [address], [place], [place] [address]    Special Requests  Final    NONE Performed at [facility], [address] [place], [place] [address]    Culture >=100,000 COLONIES/mL ESCHERICHIA COLI (A)  Final   Report Status 03/02/2021 FINAL  Final   Organism ID, Bacteria ESCHERICHIA COLI (A)  Final      Susceptibility   Escherichia coli - MIC*    AMPICILLIN >=32 RESISTANT Resistant     CEFAZOLIN <=4 SENSITIVE Sensitive     CEFEPIME <=0.12 SENSITIVE Sensitive     CEFTRIAXONE <=0.25 SENSITIVE Sensitive     CIPROFLOXACIN <=0.25 SENSITIVE Sensitive     GENTAMICIN <=1 SENSITIVE Sensitive     IMIPENEM <=0.25 SENSITIVE Sensitive     NITROFURANTOIN <=16 SENSITIVE Sensitive     TRIMETH/SULFA >=320 RESISTANT Resistant     AMPICILLIN/SULBACTAM 16 INTERMEDIATE Intermediate     PIP/TAZO <=4 SENSITIVE Sensitive     * >=100,000 COLONIES/mL ESCHERICHIA COLI  Culture, blood (Routine X 2) w Reflex to ID Panel     Status: Abnormal   Collection Time: 02/27/21 12:10 PM   Specimen: BLOOD  Result Value Ref Range Status   Specimen Description   Final    BLOOD LEFT ANTECUBITAL Performed at [facility], [address] [place], [place] [address]    Special Requests   Final    Blood Culture adequate volume BOTTLES DRAWN AEROBIC AND  ANAEROBIC Performed at [facility], [address], [place], [place] [address]    Culture  Setup Time   Final    GRAM NEGATIVE RODS IN BOTH AEROBIC AND ANAEROBIC BOTTLES CRITICAL VALUE NOTED.  VALUE IS CONSISTENT WITH PREVIOUSLY REPORTED AND CALLED VALUE.    Culture (A)  Final    ESCHERICHIA COLI SUSCEPTIBILITIES PERFORMED ON PREVIOUS CULTURE WITHIN THE LAST 5 DAYS. Performed at [facility], [place] [address], [place], [place] [address]    Report Status 03/02/2021 FINAL  Final  Culture, blood (Routine X 2) w Reflex to ID Panel     Status: Abnormal   Collection Time: 02/27/21 12:25 PM   Specimen: BLOOD  Result Value Ref Range Status   Specimen Description   Final    BLOOD RIGHT ANTECUBITAL Performed at [facility], [address] [place], [place] [address]    Special Requests   Final    BOTTLES DRAWN AEROBIC AND ANAEROBIC Blood Culture adequate volume Performed at [facility], [place], [place] [address]    Culture  Setup Time   Final    GRAM NEGATIVE RODS IN BOTH AEROBIC AND ANAEROBIC BOTTLES CRITICAL RESULT CALLED TO, READ BACK BY AND VERIFIED WITH: M SWAYNE,PHARMD@[id number]  02/28/21 [place] Performed at [facility], [place] [address] [place],  [address]    Culture ESCHERICHIA COLI (A)  Final   Report Status 03/02/2021 FINAL  Final   Organism ID, Bacteria ESCHERICHIA COLI  Final      Susceptibility   Escherichia coli - MIC*    AMPICILLIN >=32 RESISTANT Resistant     CEFAZOLIN <=4 SENSITIVE Sensitive     CEFEPIME <=0.12 SENSITIVE Sensitive     CEFTAZIDIME <=1 SENSITIVE Sensitive     CEFTRIAXONE <=0.25 SENSITIVE Sensitive     CIPROFLOXACIN <=0.25 SENSITIVE Sensitive     GENTAMICIN <=1 SENSITIVE Sensitive     IMIPENEM <=0.25 SENSITIVE Sensitive     TRIMETH/SULFA >=320 RESISTANT Resistant     AMPICILLIN/SULBACTAM 8 SENSITIVE Sensitive     PIP/TAZO <=4 SENSITIVE Sensitive     * ESCHERICHIA  COLI  Blood  Culture ID Panel (Reflexed)     Status: Abnormal   Collection Time: 02/27/21 12:25 PM  Result Value Ref Range Status   Enterococcus faecalis NOT DETECTED NOT DETECTED Final   Enterococcus Faecium NOT DETECTED NOT DETECTED Final   Listeria monocytogenes NOT DETECTED NOT DETECTED Final   Staphylococcus species NOT DETECTED NOT DETECTED Final   Staphylococcus aureus (BCID) NOT DETECTED NOT DETECTED Final   Staphylococcus epidermidis NOT DETECTED NOT DETECTED Final   Staphylococcus lugdunensis NOT DETECTED NOT DETECTED Final   Streptococcus species NOT DETECTED NOT DETECTED Final   Streptococcus agalactiae NOT DETECTED NOT DETECTED Final   Streptococcus pneumoniae NOT DETECTED NOT DETECTED Final   Streptococcus pyogenes NOT DETECTED NOT DETECTED Final   A.calcoaceticus-baumannii NOT DETECTED NOT DETECTED Final   Bacteroides fragilis NOT DETECTED NOT DETECTED Final   Enterobacterales DETECTED (A) NOT DETECTED Final    Comment: Enterobacterales represent a large order of gram negative bacteria, not a single organism. CRITICAL RESULT CALLED TO, READ BACK BY AND VERIFIED WITH: M SWAYNE,PHARMD@0612  02/28/21 [place]    Enterobacter cloacae complex NOT DETECTED NOT DETECTED Final   Escherichia coli DETECTED (A) NOT DETECTED Final    Comment: CRITICAL RESULT CALLED TO, READ BACK BY AND VERIFIED WITH: M SWAYNE,PHARMD@0610  02/28/21 [place]    Klebsiella aerogenes NOT DETECTED NOT DETECTED Final   Klebsiella oxytoca NOT DETECTED NOT DETECTED Final   Klebsiella pneumoniae NOT DETECTED NOT DETECTED Final   Proteus species NOT DETECTED NOT DETECTED Final   Salmonella species NOT DETECTED NOT DETECTED Final   Serratia marcescens NOT DETECTED NOT DETECTED Final   Haemophilus influenzae NOT DETECTED NOT DETECTED Final   Neisseria meningitidis NOT DETECTED NOT DETECTED Final   Pseudomonas aeruginosa NOT DETECTED NOT DETECTED Final   Stenotrophomonas maltophilia NOT DETECTED NOT DETECTED Final    Candida albicans NOT DETECTED NOT DETECTED Final   Candida auris NOT DETECTED NOT DETECTED Final   Candida glabrata NOT DETECTED NOT DETECTED Final   Candida krusei NOT DETECTED NOT DETECTED Final   Candida parapsilosis NOT DETECTED NOT DETECTED Final   Candida tropicalis NOT DETECTED NOT DETECTED Final   Cryptococcus neoformans/gattii NOT DETECTED NOT DETECTED Final   CTX-M ESBL NOT DETECTED NOT DETECTED Final   Carbapenem resistance IMP NOT DETECTED NOT DETECTED Final   Carbapenem resistance KPC NOT DETECTED NOT DETECTED Final   Carbapenem resistance NDM NOT DETECTED NOT DETECTED Final   Carbapenem resist OXA 48 LIKE NOT DETECTED NOT DETECTED Final   Carbapenem resistance VIM NOT DETECTED NOT DETECTED Final    Comment: Performed at [facility], [address], [place], [place] [address]  Resp Panel by RT-PCR (Flu A&B, Covid) Nasopharyngeal Swab     Status: None   Collection Time: 02/27/21  1:08 PM   Specimen: Nasopharyngeal Swab; Nasopharyngeal(NP) swabs in vial transport medium  Result Value Ref Range Status   SARS Coronavirus 2 by RT PCR NEGATIVE NEGATIVE Final    Comment: (NOTE) SARS-CoV-2 target nucleic acids are NOT DETECTED.  The SARS-CoV-2 RNA is generally detectable in upper respiratory specimens during the acute phase of infection. The lowest concentration of SARS-CoV-2 viral copies this assay can detect is 138 copies/mL. A negative result does not preclude SARS-Cov-2 infection and should not be used as the sole basis for treatment or other patient management decisions. A negative result may occur with  improper specimen collection/handling, submission of specimen other than nasopharyngeal swab, presence of viral mutation(s) within the areas targeted by this assay, and inadequate number  of viral copies(<138 copies/mL). A negative result must be combined with clinical observations, patient history, and epidemiological information. The expected result is  Negative.  Fact Sheet for Patients:  EntrepreneurPulse.com.au  Fact Sheet for Healthcare Providers:  IncredibleEmployment.be  This test is no t yet approved or cleared by the [place] FDA and  has been authorized for detection and/or diagnosis of SARS-CoV-2 by FDA under an Emergency Use Authorization (EUA). This EUA will remain  in effect (meaning this test can be used) for the duration of the COVID-19 declaration under Section 564(b)(1) of the Act, 21 U.S.C.section 360bbb-3(b)(1), unless the authorization is terminated  or revoked sooner.       Influenza A by PCR NEGATIVE NEGATIVE Final   Influenza B by PCR NEGATIVE NEGATIVE Final    Comment: (NOTE) The Xpert Xpress SARS-CoV-2/FLU/RSV plus assay is intended as an aid in the diagnosis of influenza from Nasopharyngeal swab specimens and should not be used as a sole basis for treatment. Nasal washings and aspirates are unacceptable for Xpert Xpress SARS-CoV-2/FLU/RSV testing.  Fact Sheet for Patients: EntrepreneurPulse.com.au  Fact Sheet for Healthcare Providers: IncredibleEmployment.be  This test is not yet approved or cleared by the [place] FDA and has been authorized for detection and/or diagnosis of SARS-CoV-2 by FDA under an Emergency Use Authorization (EUA). This EUA will remain in effect (meaning this test can be used) for the duration of the COVID-19 declaration under Section 564(b)(1) of the Act, 21 U.S.C. section 360bbb-3(b)(1), unless the authorization is terminated or revoked.  Performed at [facility], [address] [place], [place] [address]       Radiology Studies: No results found.   Marzetta Board, MD, PhD [facility]  Between 7 am - 7 pm I am available, please contact me via Amion (for emergencies) or Securechat (non urgent messages)  Between 7 pm - 7 am I am not available, please  contact night coverage MD/APP via Amion

## 2021-03-02 NOTE — Evaluation (Signed)
Physical Therapy Evaluation Patient Details Name: Hannah [name] MRN: [id number] DOB: Sep 27, 1932 Today's Date: 03/02/2021  History of Present Illness  87 y.o. female with medical history significant of DM2, arthritis, HTN, hypothyroidism and admitted 02/27/21 for sepsis due to UTI/pyelonephritis, also found to have E. coli bacteremia.  Pt also with occasional SVT.  Clinical Impression  Pt admitted with above diagnosis.  Pt currently with functional limitations due to the deficits listed below (see PT Problem List). Pt will benefit from skilled PT to increase their independence and safety with mobility to allow discharge to the venue listed below.  Pt assisted with ambulating in hallway and then remained in recliner to eat lunch.  Pt from home with family assist and typically doesn't use assistive device.        Recommendations for follow up therapy are one component of a multi-disciplinary discharge planning process, led by the attending physician.  Recommendations may be updated based on patient status, additional functional criteria and insurance authorization.  Follow Up Recommendations No PT follow up    Equipment Recommendations  Other (comment) (may benefit from rollator if possible, if not RW)    Recommendations for Other Services       Precautions / Restrictions Precautions Precautions: Fall      Mobility  Bed Mobility               General bed mobility comments: pt sitting EOB with NT on arrival    Transfers Overall transfer level: Needs assistance Equipment used: Rolling walker (2 wheeled) Transfers: Sit to/from Stand Sit to Stand: Min guard         General transfer comment: min/guard for safety  Ambulation/Gait Ambulation/Gait assistance: Min guard Gait Distance (Feet): 160 Feet Assistive device: Rolling walker (2 wheeled) Gait Pattern/deviations: Step-through pattern;Decreased stride length Gait velocity: decr   General Gait Details: cues for RW  positioning, per telemetry, HR in SR; pt denies symptoms  Stairs            Wheelchair Mobility    Modified Rankin (Stroke Patients Only)       Balance Overall balance assessment: Mild deficits observed, not formally tested                                           Pertinent Vitals/Pain Pain Assessment: No/denies pain    Home Living Family/patient expects to be discharged to:: Private residence Living Arrangements: Children Available Help at Discharge: Family Type of Home: House       Home Layout: One level Home Equipment: None      Prior Function Level of Independence: Independent               Hand Dominance        Extremity/Trunk Assessment        Lower Extremity Assessment Lower Extremity Assessment: Generalized weakness       Communication   Communication: No difficulties  Cognition Arousal/Alertness: Awake/alert Behavior During Therapy: WFL for tasks assessed/performed Overall Cognitive Status: Within Functional Limits for tasks assessed                                        General Comments      Exercises     Assessment/Plan    PT Assessment Patient needs continued PT  services  PT Problem List Decreased strength;Decreased mobility;Decreased activity tolerance;Decreased balance;Decreased knowledge of use of DME       PT Treatment Interventions Gait training;DME instruction;Therapeutic exercise;Balance training;Functional mobility training;Therapeutic activities;Patient/family education    PT Goals (Current goals can be found in the Care Plan section)  Acute Rehab PT Goals PT Goal Formulation: With patient Time For Goal Achievement: 03/16/21 Potential to Achieve Goals: Good    Frequency Min 3X/week   Barriers to discharge        Co-evaluation               AM-PAC PT "6 Clicks" Mobility  Outcome Measure Help needed turning from your back to your side while in a flat bed  without using bedrails?: A Little Help needed moving from lying on your back to sitting on the side of a flat bed without using bedrails?: A Little Help needed moving to and from a bed to a chair (including a wheelchair)?: A Little Help needed standing up from a chair using your arms (e.g., wheelchair or bedside chair)?: A Little Help needed to walk in hospital room?: A Little Help needed climbing 3-5 steps with a railing? : A Little 6 Click Score: 18    End of Session   Activity Tolerance: Patient tolerated treatment well Patient left: in chair;with call bell/phone within reach (chair alarm set up however batteries died, NT notified) Nurse Communication: Mobility status PT Visit Diagnosis: Difficulty in walking, not elsewhere classified (R26.2)    Time: 2694-8546 PT Time Calculation (min) (ACUTE ONLY): 14 min   Charges:   PT Evaluation $PT Eval Low Complexity: 1 Low         Kati PT, DPT Acute Rehabilitation Services Pager: [phone] Office: [place] 03/02/2021, 1:17 PM

## 2021-03-02 NOTE — Consult Note (Signed)
CARDIOLOGY CONSULT NOTE  Patient ID: Hannah [name] MRN: [id number] DOB/AGE: [birth year] 87 y.o.  Admit date: 02/27/2021 Referring Physician: [facility] hospitalist Reason for Consultation:  Tachycardia  HPI:   87-year.old [name] female with mild dementiNow witha, h/o oral cancer in remission, type 2 DM, depression, longstanding exertional dyspnea Without overtly abnormal cardiopulmonary evaluation, now with tachcyardia episdoes  Patient was admitted to [facility] on 02/27/2021 with complaints of nausea and vomiting.  Work-up was concerning for cystitis as well as pyelonephritis, but fever, tachycardia, leukocytosis.  She is now appropriately treated with antibiotics.  However, she has now been having episodes of brief runs of tachycardia.  Cardiologist consulted.  Past Medical History:  Diagnosis Date   Depression    Diabetes mellitus    Hypercholesteremia    Hypertension    Hypothyroidism    Oral cancer ([place]) 2012   surgery and radiation   Spinal stenosis    Tuberculosis    1978   Weight loss, unintentional      Past Surgical History:  Procedure Laterality Date   AORTIC ARCH ANGIOGRAPHY  09/2019   COLONOSCOPY     EYE SURGERY Bilateral    cataracts   LIPOMA EXCISION     back   LUMBAR LAMINECTOMY/DECOMPRESSION MICRODISCECTOMY Right 04/12/2018   Procedure: Laminectomy and Foraminotomy - Lumbar four-Lumbar five - Lumbar five-Sacral one - right;  Surgeon: Eustace Moore, MD;  Location: [place];  Service: Neurosurgery;  Laterality: Right;   LUMBAR SPINE SURGERY  05/2019   MASS EXCISION Left 04/02/2019   Procedure: EXCISION OF LIPOMATOUS MASS OF THE LEFT BACK;  Surgeon: Johnathan Hausen, MD;  Location: [place];  Service: General;  Laterality: Left;   OTHER SURGICAL HISTORY     squamous cell removal;skin graft   RIGHT/LEFT HEART CATH AND CORONARY ANGIOGRAPHY N/A 09/18/2019   Procedure: RIGHT/LEFT HEART CATH AND CORONARY ANGIOGRAPHY;  Surgeon: Adrian Prows,  MD;  Location: [place] [facility];  Service: Cardiovascular;  Laterality: N/A;   TOOTH EXTRACTION     all pulled      Family History  Problem Relation Age of Onset   Arthritis Mother        Late 60s   Heart attack Father        Deceased, 37   Heart disease Brother    Hypertension Other    Hyperlipidemia Other    Diabetes type II Sister    Diabetes Brother      Social History: Social History   Socioeconomic History   Marital status: Widowed    Spouse name: Not on file   Number of children: 4   Years of education: Not on file   Highest education level: Not on file  Occupational History   Not on file  Tobacco Use   Smoking status: Never   Smokeless tobacco: Never  Vaping Use   Vaping Use: Never used  Substance and Sexual Activity   Alcohol use: No    Alcohol/week: 0.0 standard drinks   Drug use: No   Sexual activity: Never  Other Topics Concern   Not on file  Social History Narrative   Lives at home with two daughters.      Social Determinants of Health   Financial Resource Strain: Not on file  Food Insecurity: Not on file  Transportation Needs: Not on file  Physical Activity: Not on file  Stress: Not on file  Social Connections: Not on file  Intimate Partner Violence: Not on file  Medications Prior to Admission  Medication Sig Dispense Refill Last Dose   acetaminophen (TYLENOL) 325 MG tablet Take 650 mg by mouth every 6 (six) hours as needed for mild pain.    02/26/2021   albuterol (VENTOLIN HFA) 108 (90 Base) MCG/ACT inhaler Inhale 2 puffs into the lungs every 6 (six) hours as needed for wheezing or shortness of breath. 18 g 3 12/03/2020   amLODipine (NORVASC) 5 MG tablet Take 1 tablet by mouth once daily (Patient taking differently: Take 5 mg by mouth daily.) 30 tablet 0 02/27/2021   augmented betamethasone dipropionate (DIPROLENE-AF) 0.05 % cream Apply 1 application topically daily as needed (rash).    Past Month   budesonide-formoterol (SYMBICORT)  160-4.5 MCG/ACT inhaler Inhale 2 puffs into the lungs 2 (two) times daily. 1 Inhaler 6 11/27/2020   Cholecalciferol (VITAMIN D-3) 125 MCG (5000 UT) TABS Take 1,000 mg by mouth daily.   02/26/2021   cyanocobalamin 1000 MCG tablet Take 1,000 mcg by mouth daily.   02/26/2021   diclofenac sodium (VOLTAREN) 1 % GEL Apply 2 g topically 3 (three) times daily as needed (joint pain).   Past Week   ezetimibe-simvastatin (VYTORIN) 10-20 MG per tablet Take 1 tablet by mouth daily.    02/26/2021   insulin glargine (LANTUS SOLOSTAR) 100 UNIT/ML Solostar Pen Inject 10 Units into the skin at bedtime.   02/26/2021   insulin lispro (HUMALOG) 100 UNIT/ML KwikPen Inject 2-10 Units into the skin 3 (three) times daily. Sliding scale   02/26/2021   latanoprost (XALATAN) 0.005 % ophthalmic solution Place 1 drop into both eyes at bedtime.   02/26/2021   levothyroxine (SYNTHROID, LEVOTHROID) 50 MCG tablet Take 25-50 mcg by mouth See admin instructions. Take 58mcg alternating 92mcg every other day.   02/27/2021   lidocaine (XYLOCAINE) 2 % solution Use as directed 3 mLs in the mouth or throat daily as needed for mouth pain.   02/26/2021   megestrol (MEGACE) 400 MG/10ML suspension Take 400 mg by mouth daily.    02/26/2021   MYRBETRIQ 25 MG TB24 tablet Take 25 mg by mouth daily.   02/26/2021   sitaGLIPtin (JANUVIA) 25 MG tablet Take 50 mg by mouth daily.   02/26/2021   chlorthalidone (HYGROTON) 25 MG tablet Take 1 tablet (25 mg total) by mouth every morning. (Patient not taking: Reported on 02/27/2021) 30 tablet 2 Not Taking   HYDROcodone-acetaminophen (NORCO/VICODIN) 5-325 MG tablet Take 1 tablet by mouth every 6 (six) hours as needed for moderate pain. (Patient not taking: Reported on 02/27/2021) 15 tablet 0 Not Taking    Review of Systems  Constitutional: Negative for decreased appetite, malaise/fatigue, weight gain and weight loss.  HENT:  Negative for congestion.   Eyes:  Negative for visual disturbance.  Cardiovascular:   Negative for chest pain, dyspnea on exertion, leg swelling, palpitations and syncope.  Respiratory:  Negative for cough.   Endocrine: Negative for cold intolerance.  Hematologic/Lymphatic: Does not bruise/bleed easily.  Skin:  Negative for itching and rash.  Musculoskeletal:  Negative for myalgias.  Gastrointestinal:  Negative for abdominal pain, nausea and vomiting.  Genitourinary:  Negative for dysuria.  Neurological:  Negative for dizziness and weakness.  Psychiatric/Behavioral:  The patient is not nervous/anxious.   All other systems reviewed and are negative.   Vitals:   03/02/21 0844 03/02/21 1402  BP:  (!) 115/57  Pulse:  83  Resp:  18  Temp:  98.3 F (36.8 C)  SpO2: 97% 96%   [employer]  02/27/21 1024  Weight: 39.9 kg     Physical Exam: Physical Exam Vitals and nursing note reviewed.  Constitutional:      General: She is not in acute distress.    Appearance: She is well-developed and underweight. She is not ill-appearing.  HENT:     Head: Normocephalic and atraumatic.  Eyes:     Conjunctiva/sclera: Conjunctivae normal.     Pupils: Pupils are equal, round, and reactive to light.  Neck:     Vascular: No JVD.  Cardiovascular:     Rate and Rhythm: Normal rate and regular rhythm.     Pulses: Normal pulses and intact distal pulses.     Heart sounds: No murmur heard. Pulmonary:     Effort: Pulmonary effort is normal.     Breath sounds: Normal breath sounds. No wheezing or rales.  Abdominal:     General: Bowel sounds are normal.     Palpations: Abdomen is soft.     Tenderness: There is no rebound.  Musculoskeletal:        General: No tenderness. Normal range of motion.     Left lower leg: No edema.  Lymphadenopathy:     Cervical: No cervical adenopathy.  Skin:    General: Skin is warm and dry.  Neurological:     Mental Status: She is alert and oriented to person, place, and time.     Cranial Nerves: No cranial nerve deficit.     Labs:   Lab  Results  Component Value Date   WBC 11.0 (H) 03/02/2021   HGB 10.9 (L) 03/02/2021   HCT 33.5 (L) 03/02/2021   MCV 87.7 03/02/2021   PLT 285 03/02/2021    Recent Labs  Lab 03/01/21 0328 03/02/21 0413  NA 134* 136  K 3.8 3.3*  CL 104 103  CO2 21* 24  BUN 7* 7*  CREATININE 0.87 0.73  CALCIUM 8.7* 8.6*  PROT 6.5  --   BILITOT 0.3  --   ALKPHOS 52  --   ALT 20  --   AST 28  --   GLUCOSE 192* 147*    No results found for: CKTOTAL, CKMB, CKMBINDEX   TSH Recent Labs    03/01/21 1113  TSH 0.663      Radiology: No results found.  Scheduled Meds:  ezetimibe  10 mg Oral Daily   And   atorvastatin  10 mg Oral Daily   cefadroxil  1,000 mg Oral BID   diltiazem  120 mg Oral Daily   heparin injection (subcutaneous)  5,000 Units Subcutaneous Q12H   insulin aspart  0-9 Units Subcutaneous TID WC   insulin glargine-yfgn  8 Units Subcutaneous QHS   latanoprost  1 drop Both Eyes QHS   [START ON 03/03/2021] levothyroxine  25 mcg Oral Q0600   linagliptin  5 mg Oral Daily   megestrol  400 mg Oral Daily   metoprolol tartrate  25 mg Oral BID   mirabegron ER  25 mg Oral Daily   mometasone-formoterol  2 puff Inhalation BID   Continuous Infusions: PRN Meds:.acetaminophen **OR** acetaminophen, albuterol, ondansetron **OR** ondansetron (ZOFRAN) IV  CARDIAC STUDIES:  Telemetry: Narrow QRS tachycardia episodes  EKG 03/01/2021: Sinus tachycardia with PVCs  Echocardiogram 03/01/2021:  (Read by Dr. Eleonore Chiquito, Excerpts of the report by be below. I am not sure why the report is not available in Epic) Normal size LV. LVEF 55-60%. Grade 1 DD Normal RV size and systolic function. Inadequate TR jet to estimate PASP No  significant valvular abnormality Normal right atrial pressure.    Lexiscan myoview stress test 08/07/2018:  1. Lexiscan stress test was performed. Exercise capacity was not assessed. Stress symptoms included dizziness and headache. Resting blood pressure was 112/60  mmHg and peak effect blood pressure was 120/62 mmHg. The resting and stress electrocardiogram demonstrated normal sinus rhythm, normal conduction, no arrhythmias and normal repolarization. Stress EKG is non diagnostic for ischemia as it is a pharmacologic stress.  2. The overall quality of the study is good. There is no evidence of abnormal lung activity. Stress and rest SPECT images demonstrate homogeneous tracer distribution throughout the myocardium. Gated SPECT imaging reveals normal myocardial thickening and wall motion. The left ventricular ejection fraction was normal (62%).   3. Low risk study.      Assessment & Recommendations:  88-year.old [place] female with mild dementiNow witha, h/o oral cancer in remission, type 2 DM, depression, longstanding exertional dyspnea Without overtly abnormal cardiopulmonary evaluation, now with tachcyardia episodes  Tachycardia: Likely atrial tachycardia episodes. Structurally normal heart.  Patient is completely asymptomatic with these. Frequency and duration seems to have improved after addition of diltiazem 120 mg this morning. I would continue metoprolol tartrate 25 mg bid, and diltiazem 120 mg daily. If the trend continues, no further changes necessary. Will arrange outpatient follow up. Okay to continue other antihypertensive medications to avoid hypotension.     Nigel Mormon, MD Pager: [phone] Office: [phone]

## 2021-03-03 LAB — BASIC METABOLIC PANEL
Anion gap: 7 (ref 5–15)
BUN: 9 mg/dL (ref 8–23)
CO2: 25 mmol/L (ref 22–32)
Calcium: 9.2 mg/dL (ref 8.9–10.3)
Chloride: 106 mmol/L (ref 98–111)
Creatinine, Ser: 0.81 mg/dL (ref 0.44–1.00)
GFR, Estimated: >60 mL/min (ref >60)
Glucose, Bld: 130 mg/dL — ABNORMAL HIGH (ref 70–99)
Potassium: 4.2 mmol/L (ref 3.5–5.1)
Sodium: 138 mmol/L (ref 135–145)

## 2021-03-03 LAB — GLUCOSE, CAPILLARY
Glucose-Capillary: 155 mg/dL — ABNORMAL HIGH (ref 70–99)
Glucose-Capillary: 304 mg/dL — ABNORMAL HIGH (ref 70–99)

## 2021-03-03 LAB — MAGNESIUM: Magnesium: 1.9 mg/dL (ref 1.7–2.4)

## 2021-03-03 MED ORDER — LEVOTHYROXINE SODIUM 50 MCG PO TABS: 50.0000 ug | ORAL_TABLET | ORAL | 0 refills | Status: DC

## 2021-03-03 MED ORDER — DILTIAZEM HCL ER COATED BEADS 120 MG PO CP24: 120.0000 mg | ORAL_CAPSULE | Freq: Every day | ORAL | 0 refills | Status: DC

## 2021-03-03 MED ORDER — CEFADROXIL 500 MG PO CAPS: 1000.0000 mg | ORAL_CAPSULE | Freq: Two times a day (BID) | ORAL | 0 refills | Status: DC

## 2021-03-03 MED ORDER — METOPROLOL TARTRATE 25 MG PO TABS: 25.0000 mg | ORAL_TABLET | Freq: Two times a day (BID) | ORAL | 0 refills | Status: DC

## 2021-03-03 NOTE — Plan of Care (Signed)

## 2021-03-03 NOTE — TOC Transition Note (Signed)
Transition of Care [facility]) - CM/SW Discharge Note   Patient Details  Name: LIDA BERKERY MRN: [id number] Date of Birth: Jun 07, 1932  [facility] CM/SW Contact:  Leeroy Cha, RN Phone Number: 03/03/2021, 1:12 PM   Clinical Narrative:    Dcd to home no needs   Final next level of care: Home/Self Care Barriers to Discharge: No Barriers Identified   Patient Goals and CMS Choice Patient states their goals for this hospitalization and ongoing recovery are:: to go home CMS Medicare.gov Compare Post Acute Care list provided to:: Patient Choice offered to / list presented to : Patient  Discharge Placement                       Discharge Plan and Services   Discharge Planning Services: CM Consult                                 Social Determinants of Health (SDOH) Interventions     Readmission Risk Interventions No flowsheet data found.

## 2021-03-03 NOTE — Progress Notes (Signed)
Patient d/c'd home.  AVS reviewed with daughter and pt; both reported understanding.  Patient transported out via w/c; escorted per nursing student.  No s/s of distress noted or reported upon her departure.

## 2021-03-03 NOTE — Discharge Summary (Signed)
Physician Discharge Summary  Hannah [name] LSL:[id number] DOB: Sep 30, 1932 DOA: 02/27/2021  PCP: Donald Prose, MD  Admit date: 02/27/2021 Discharge date: 03/03/2021  Admitted From: home Disposition:  home  Recommendations for Outpatient Follow-up:  Follow up with PCP in 1-2 weeks Repeat a TSH in 3 weeks Follow-up with Dr. Einar Gip in 2 to 3 weeks  Home Health: none Equipment/Devices: none  Discharge Condition: stable CODE STATUS: Full code Diet recommendation: regular  HPI: Per admitting MD, Hannah [name] is a 87 y.o. female with medical history significant of DM2, arthritis, HTN, hypothyroidism who comes to the hospital with complaints of nausea, vomiting, poor p.o. intake over the last 3 to 4 days.  She also has been complaining of lower abdominal discomfort as well as dysuria for the same amount of time.  She reports fevers at home.  She denies any chest pain, denies any shortness of breath.  She reports increased frequency as well as back pain (however this is chronic for her).  I was able to talk to the daughter and daughter feels like she may have had symptoms for a little while longer, but 2 days ago she noticed that her mother had significant weakness, was feeling very sick and appeared to have chills.  Given progression of her symptoms and continued to feel somnolent, sick and had poor p.o. intake they called the primary care doctor this morning who advised them to bring patient to the ER.  Hospital Course / Discharge diagnoses: Principal Problem Sepsis due to urinary tract infection/pyelonephritis, E. coli bacteremia-patient was admitted to the hospital with sepsis due to UTI. She met sepsis criteria on admission with fever of 101, respiratory rate of 27, heart rate of 125, WBC of 20.7 and a urinary source.  In addition, there was also evidence of pyelonephritis and blood cultures were positive for E. coli.  She was initially maintained on IV ceftriaxone, improved, sepsis  physiology has resolved, WBC has improved to 11.0, clinically has returned to baseline and will be discharged home in stable condition.  E. coli susceptibilities showed resistance to ampicillin and Bactrim but otherwise sensitive.  She will be placed for cefadroxil for 7 additional days upon discharge   Active Problems SVT-while hospitalized she has had several bursts of SVT, initially with relatively high burden, improved upon addition of metoprolol as well as diltiazem.  Cardiology consulted, she underwent a 2D echo which was structurally normal.  She is asymptomatic, upon discharge recommend follow-up as an outpatient with cardiology Hypomagnesemia-repleted, now within acceptable parameters Hypokalemia-repleted, normalized Essential hypertension -currently on metoprolol and diltiazem, hold amlodipine and chlorthalidone upon discharge, discussed with cardiology Chronic diastolic CHF-appears euvolemic  Type 2 diabetes mellitus-continue home regimen Hypothyroidism -continue home Synthroid.  TSH was 0.66, within normal parameters but on the low side.  She was taking Synthroid 25 alternating with 50 mcg every other day but a few weeks ago this was increased to 75 MCG daily.  Given SVTs as above would recommend goal TSH closer to 5, decrease Synthroid to 50 MCG daily and repeat TSH in 3 weeks Hyperlipidemia -continue home medications Chronic asthma-no wheezing, continue home medications Chronic dyspnea on exertion-continue inhalers as above, recently worked up by cardiology with a negative cardiac cath History of oral cancer, left buccal carcinoma-back in 2013, resected and reconstructed, follows regularly with [facility]    Discharge Instructions   Allergies as of 03/03/2021       Reactions   Sulfur    Penicillins Rash  1980's   Sulfa Antibiotics Rash        Medication List     STOP taking these medications    amLODipine 5 MG tablet Commonly known as: NORVASC    chlorthalidone 25 MG tablet Commonly known as: HYGROTON   HYDROcodone-acetaminophen 5-325 MG tablet Commonly known as: NORCO/VICODIN       TAKE these medications    acetaminophen 325 MG tablet Commonly known as: TYLENOL Take 650 mg by mouth every 6 (six) hours as needed for mild pain.   albuterol 108 (90 Base) MCG/ACT inhaler Commonly known as: VENTOLIN HFA Inhale 2 puffs into the lungs every 6 (six) hours as needed for wheezing or shortness of breath.   augmented betamethasone dipropionate 0.05 % cream Commonly known as: DIPROLENE-AF Apply 1 application topically daily as needed (rash).   budesonide-formoterol 160-4.5 MCG/ACT inhaler Commonly known as: Symbicort Inhale 2 puffs into the lungs 2 (two) times daily.   cefadroxil 500 MG capsule Commonly known as: DURICEF Take 2 capsules (1,000 mg total) by mouth 2 (two) times daily.   cyanocobalamin 1000 MCG tablet Take 1,000 mcg by mouth daily.   diclofenac sodium 1 % Gel Commonly known as: VOLTAREN Apply 2 g topically 3 (three) times daily as needed (joint pain).   diltiazem 120 MG 24 hr capsule Commonly known as: CARDIZEM CD Take 1 capsule (120 mg total) by mouth daily.   ezetimibe-simvastatin 10-20 MG tablet Commonly known as: VYTORIN Take 1 tablet by mouth daily.   insulin lispro 100 UNIT/ML KwikPen Commonly known as: HUMALOG Inject 2-10 Units into the skin 3 (three) times daily. Sliding scale   Lantus SoloStar 100 UNIT/ML Solostar Pen Generic drug: insulin glargine Inject 10 Units into the skin at bedtime.   latanoprost 0.005 % ophthalmic solution Commonly known as: XALATAN Place 1 drop into both eyes at bedtime.   levothyroxine 50 MCG tablet Commonly known as: SYNTHROID Take 1 tablet (50 mcg total) by mouth See admin instructions. Take 30mg alternating 518m every other day. What changed: how much to take   lidocaine 2 % solution Commonly known as: XYLOCAINE Use as directed 3 mLs in the mouth or  throat daily as needed for mouth pain.   megestrol 400 MG/10ML suspension Commonly known as: MEGACE Take 400 mg by mouth daily.   metoprolol tartrate 25 MG tablet Commonly known as: LOPRESSOR Take 1 tablet (25 mg total) by mouth 2 (two) times daily.   Myrbetriq 25 MG Tb24 tablet Generic drug: mirabegron ER Take 25 mg by mouth daily.   sitaGLIPtin 25 MG tablet Commonly known as: JANUVIA Take 50 mg by mouth daily.   Vitamin D-3 125 MCG (5000 UT) Tabs Take 1,000 mg by mouth daily.       Consultations: Cardiology   Procedures/Studies:  CT Abdomen Pelvis W Contrast  Result Date: 02/27/2021 CLINICAL DATA:  Abdominal abscess/infection suspected EXAM: CT ABDOMEN AND PELVIS WITH CONTRAST TECHNIQUE: Multidetector CT imaging of the abdomen and pelvis was performed using the standard protocol following bolus administration of intravenous contrast. CONTRAST:  10036mMNIPAQUE IOHEXOL 300 MG/ML  SOLN COMPARISON:  September 2021 FINDINGS: Lower chest: No acute abnormality. Hepatobiliary: No focal liver abnormality is seen. No gallstones, gallbladder wall thickening, or biliary dilatation. Pancreas: Atrophic.  Otherwise unremarkable. Spleen: Unremarkable. Adrenals/Urinary Tract: Adrenals are unremarkable. There is heterogeneous, decreased enhancement at the lower pole the right kidney. Partially distended bladder with wall thickening. Urothelial enhancement noted. No calculi. Stomach/Bowel: Small hiatal hernia. Stomach is otherwise unremarkable. Bowel is normal  in caliber. Minimal distal colonic diverticulosis. Vascular/Lymphatic: Aortic atherosclerosis. No enlarged lymph nodes. Reproductive: Uterus and bilateral adnexa are unremarkable. Other: No free fluid.  Abdominal wall is unremarkable. Musculoskeletal: Degenerative changes of the included spine. No acute osseous abnormality. IMPRESSION: Findings suspicious for cystitis with ascending urinary tract infection and right pyelonephritis. No calculi.  Aortic atherosclerosis. Minimal colonic diverticulosis. Electronically Signed   By: Macy Mis M.D.   On: 02/27/2021 13:20   DG Chest Port 1 View  Result Date: 02/27/2021 CLINICAL DATA:  Cough EXAM: PORTABLE CHEST 1 VIEW COMPARISON:  07/14/2018 FINDINGS: Cardiomediastinal contours within normal limits. Atherosclerotic calcification of the aortic knob. Coarsened interstitial markings bilaterally. No focal airspace consolidation, pleural effusion, or pneumothorax. IMPRESSION: Coarsened interstitial markings bilaterally, which may reflect bronchitic type lung changes. No focal airspace consolidation. Electronically Signed   By: Davina Poke D.O.   On: 02/27/2021 13:12   ECHOCARDIOGRAM COMPLETE  Result Date: 03/01/2021    ECHOCARDIOGRAM REPORT   Patient Name:   ALJEAN HORIUCHI Date of Exam: 03/01/2021 Medical Rec #:  [id number]       Height:       62.0 in Accession #:    [id number]      Weight:       88.0 lb Date of Birth:  05/17/1933      BSA:          1.348 m Patient Age:    6 years        BP:           132/70 mmHg Patient Gender: F               HR:           62 bpm. Exam Location:  Inpatient Procedure: 2D Echo, Cardiac Doppler and Color Doppler Indications:     Abnormal ECG R94.31  History:         Patient has no prior history of Echocardiogram examinations.  Sonographer:     Merrie Roof RDCS Referring Phys:  Caren Griffins Diagnosing Phys: Eleonore Chiquito MD IMPRESSIONS  1. Left ventricular ejection fraction, by estimation, is 55 to 60%. The left ventricle has normal function. The left ventricle has no regional wall motion abnormalities. Left ventricular diastolic parameters are consistent with Grade I diastolic dysfunction (impaired relaxation).  2. Right ventricular systolic function is normal. The right ventricular size is normal. Tricuspid regurgitation signal is inadequate for assessing PA pressure.  3. The mitral valve is grossly normal. No evidence of mitral valve regurgitation. No  evidence of mitral stenosis.  4. The aortic valve is tricuspid. There is mild thickening of the aortic valve. Aortic valve regurgitation is not visualized. No aortic stenosis is present.  5. The inferior vena cava is normal in size with greater than 50% respiratory variability, suggesting right atrial pressure of 3 mmHg. FINDINGS  Left Ventricle: Left ventricular ejection fraction, by estimation, is 55 to 60%. The left ventricle has normal function. The left ventricle has no regional wall motion abnormalities. The left ventricular internal cavity size was normal in size. There is  no left ventricular hypertrophy. Left ventricular diastolic parameters are consistent with Grade I diastolic dysfunction (impaired relaxation). Right Ventricle: The right ventricular size is normal. No increase in right ventricular wall thickness. Right ventricular systolic function is normal. Tricuspid regurgitation signal is inadequate for assessing PA pressure. Left Atrium: Left atrial size was normal in size. Right Atrium: Right atrial size was normal in size. Pericardium: There is no evidence  of pericardial effusion. Presence of pericardial fat pad. Mitral Valve: The mitral valve is grossly normal. No evidence of mitral valve regurgitation. No evidence of mitral valve stenosis. Tricuspid Valve: The tricuspid valve is grossly normal. Tricuspid valve regurgitation is trivial. No evidence of tricuspid stenosis. Aortic Valve: The aortic valve is tricuspid. There is mild thickening of the aortic valve. Aortic valve regurgitation is not visualized. No aortic stenosis is present. Aortic valve mean gradient measures 4.0 mmHg. Aortic valve peak gradient measures 7.2 mmHg. Aortic valve area, by VTI measures 1.55 cm. Pulmonic Valve: The pulmonic valve was grossly normal. Pulmonic valve regurgitation is not visualized. No evidence of pulmonic stenosis. Aorta: The aortic root is normal in size and structure. Venous: The inferior vena cava is  normal in size with greater than 50% respiratory variability, suggesting right atrial pressure of 3 mmHg. IAS/Shunts: The atrial septum is grossly normal.  LEFT VENTRICLE PLAX 2D LVIDd:         3.50 cm   Diastology LVIDs:         2.30 cm   LV e' medial:    6.53 cm/s LV PW:         1.00 cm   LV E/e' medial:  11.5 LV IVS:        0.90 cm   LV e' lateral:   6.09 cm/s LVOT diam:     1.80 cm   LV E/e' lateral: 12.3 LV SV:         41 LV SV Index:   30 LVOT Area:     2.54 cm  RIGHT VENTRICLE RV Basal diam:  3.40 cm LEFT ATRIUM             Index        RIGHT ATRIUM           Index LA diam:        2.60 cm 1.93 cm/m   RA Area:     10.60 cm LA Vol (A2C):   25.7 ml 19.06 ml/m  RA Volume:   21.90 ml  16.24 ml/m LA Vol (A4C):   33.7 ml 25.00 ml/m LA Biplane Vol: 29.4 ml 21.81 ml/m  AORTIC VALVE AV Area (Vmax):    1.46 cm AV Area (Vmean):   1.35 cm AV Area (VTI):     1.55 cm AV Vmax:           134.00 cm/s AV Vmean:          98.100 cm/s AV VTI:            0.264 m AV Peak Grad:      7.2 mmHg AV Mean Grad:      4.0 mmHg LVOT Vmax:         77.00 cm/s LVOT Vmean:        52.200 cm/s LVOT VTI:          0.161 m LVOT/AV VTI ratio: 0.61  AORTA Ao Root diam: 2.70 cm MITRAL VALVE MV Area (PHT): 4.89 cm    SHUNTS MV Decel Time: 155 msec    Systemic VTI:  0.16 m MV E velocity: 75.00 cm/s  Systemic Diam: 1.80 cm MV A velocity: 84.80 cm/s MV E/A ratio:  0.88 Eleonore Chiquito MD Electronically signed by Eleonore Chiquito MD Signature Date/Time: 03/01/2021/2:11:01 PM    Final (Updated)      Subjective: - no chest pain, shortness of breath, no abdominal pain, nausea or vomiting.   Discharge Exam: BP (!) 115/57 (BP Location: Right  Arm)   Pulse 82   Temp 97.7 F (36.5 C) (Oral)   Resp 16   Ht _0  (1.575 m)   Wt 39.9 kg   SpO2 99%   BMI 16.10 kg/m   General: Pt is alert, awake, not in acute distress Cardiovascular: RRR, S1/S2 +, no rubs, no gallops Respiratory: CTA bilaterally, no wheezing, no rhonchi Abdominal: Soft, NT, ND,  bowel sounds + Extremities: no edema, no cyanosis   The results of significant diagnostics from this hospitalization (including imaging, microbiology, ancillary and laboratory) are listed below for reference.     Microbiology: Recent Results (from the past 240 hour(s))  Urine Culture     Status: Abnormal   Collection Time: 02/27/21 11:15 AM   Specimen: Urine, Clean Catch  Result Value Ref Range Status   Specimen Description   Final    URINE, CLEAN CATCH Performed at [facility], [address], [place], [place] [address]    Special Requests   Final    NONE Performed at [facility], [address], [place], [place] [address]    Culture >=100,000 COLONIES/mL ESCHERICHIA COLI (A)  Final   Report Status 03/02/2021 FINAL  Final   Organism ID, Bacteria ESCHERICHIA COLI (A)  Final      Susceptibility   Escherichia coli - MIC*    AMPICILLIN >=32 RESISTANT Resistant     CEFAZOLIN <=4 SENSITIVE Sensitive     CEFEPIME <=0.12 SENSITIVE Sensitive     CEFTRIAXONE <=0.25 SENSITIVE Sensitive     CIPROFLOXACIN <=0.25 SENSITIVE Sensitive     GENTAMICIN <=1 SENSITIVE Sensitive     IMIPENEM <=0.25 SENSITIVE Sensitive     NITROFURANTOIN <=16 SENSITIVE Sensitive     TRIMETH/SULFA >=320 RESISTANT Resistant     AMPICILLIN/SULBACTAM 16 INTERMEDIATE Intermediate     PIP/TAZO <=4 SENSITIVE Sensitive     * >=100,000 COLONIES/mL ESCHERICHIA COLI  Culture, blood (Routine X 2) w Reflex to ID Panel     Status: Abnormal   Collection Time: 02/27/21 12:10 PM   Specimen: BLOOD  Result Value Ref Range Status   Specimen Description   Final    BLOOD LEFT ANTECUBITAL Performed at [facility], [address], [place], [place] [address]    Special Requests   Final    Blood Culture adequate volume BOTTLES DRAWN AEROBIC AND ANAEROBIC Performed at [facility], [address], [place], [place] [address]    Culture  Setup Time    Final    GRAM NEGATIVE RODS IN BOTH AEROBIC AND ANAEROBIC BOTTLES CRITICAL VALUE NOTED.  VALUE IS CONSISTENT WITH PREVIOUSLY REPORTED AND CALLED VALUE.    Culture (A)  Final    ESCHERICHIA COLI SUSCEPTIBILITIES PERFORMED ON PREVIOUS CULTURE WITHIN THE LAST 5 DAYS. Performed at [facility], [place] [address], [place], [place] [address]    Report Status 03/02/2021 FINAL  Final  Culture, blood (Routine X 2) w Reflex to ID Panel     Status: Abnormal   Collection Time: 02/27/21 12:25 PM   Specimen: BLOOD  Result Value Ref Range Status   Specimen Description   Final    BLOOD RIGHT ANTECUBITAL Performed at [facility], [address], [place], [place] [address]    Special Requests   Final    BOTTLES DRAWN AEROBIC AND ANAEROBIC Blood Culture adequate volume Performed at [facility], [address], [place], [place] [address]    Culture  Setup Time   Final    Lonell Grandchild  NEGATIVE RODS IN BOTH AEROBIC AND ANAEROBIC BOTTLES CRITICAL RESULT CALLED TO, READ BACK BY AND VERIFIED WITH: M SWAYNE,PHARMD_[id number]  02/28/21 [place] Performed at [facility], [place] [address] [place], [place] [address]    Culture ESCHERICHIA COLI (A)  Final   Report Status 03/02/2021 FINAL  Final   Organism ID, Bacteria ESCHERICHIA COLI  Final      Susceptibility   Escherichia coli - MIC*    AMPICILLIN >=32 RESISTANT Resistant     CEFAZOLIN <=4 SENSITIVE Sensitive     CEFEPIME <=0.12 SENSITIVE Sensitive     CEFTAZIDIME <=1 SENSITIVE Sensitive     CEFTRIAXONE <=0.25 SENSITIVE Sensitive     CIPROFLOXACIN <=0.25 SENSITIVE Sensitive     GENTAMICIN <=1 SENSITIVE Sensitive     IMIPENEM <=0.25 SENSITIVE Sensitive     TRIMETH/SULFA >=320 RESISTANT Resistant     AMPICILLIN/SULBACTAM 8 SENSITIVE Sensitive     PIP/TAZO <=4 SENSITIVE Sensitive     * ESCHERICHIA COLI  Blood Culture ID Panel (Reflexed)     Status: Abnormal   Collection Time: 02/27/21 12:25 PM  Result Value Ref Range  Status   Enterococcus faecalis NOT DETECTED NOT DETECTED Final   Enterococcus Faecium NOT DETECTED NOT DETECTED Final   Listeria monocytogenes NOT DETECTED NOT DETECTED Final   Staphylococcus species NOT DETECTED NOT DETECTED Final   Staphylococcus aureus (BCID) NOT DETECTED NOT DETECTED Final   Staphylococcus epidermidis NOT DETECTED NOT DETECTED Final   Staphylococcus lugdunensis NOT DETECTED NOT DETECTED Final   Streptococcus species NOT DETECTED NOT DETECTED Final   Streptococcus agalactiae NOT DETECTED NOT DETECTED Final   Streptococcus pneumoniae NOT DETECTED NOT DETECTED Final   Streptococcus pyogenes NOT DETECTED NOT DETECTED Final   A.calcoaceticus-baumannii NOT DETECTED NOT DETECTED Final   Bacteroides fragilis NOT DETECTED NOT DETECTED Final   Enterobacterales DETECTED (A) NOT DETECTED Final    Comment: Enterobacterales represent a large order of gram negative bacteria, not a single organism. CRITICAL RESULT CALLED TO, READ BACK BY AND VERIFIED WITH: M SWAYNE,PHARMD_1  02/28/21 [place]    Enterobacter cloacae complex NOT DETECTED NOT DETECTED Final   Escherichia coli DETECTED (A) NOT DETECTED Final    Comment: CRITICAL RESULT CALLED TO, READ BACK BY AND VERIFIED WITH: M SWAYNE,PHARMD_2  02/28/21 [place]    Klebsiella aerogenes NOT DETECTED NOT DETECTED Final   Klebsiella oxytoca NOT DETECTED NOT DETECTED Final   Klebsiella pneumoniae NOT DETECTED NOT DETECTED Final   Proteus species NOT DETECTED NOT DETECTED Final   Salmonella species NOT DETECTED NOT DETECTED Final   Serratia marcescens NOT DETECTED NOT DETECTED Final   Haemophilus influenzae NOT DETECTED NOT DETECTED Final   Neisseria meningitidis NOT DETECTED NOT DETECTED Final   Pseudomonas aeruginosa NOT DETECTED NOT DETECTED Final   Stenotrophomonas maltophilia NOT DETECTED NOT DETECTED Final   Candida albicans NOT DETECTED NOT DETECTED Final   Candida auris NOT DETECTED NOT DETECTED Final   Candida glabrata NOT  DETECTED NOT DETECTED Final   Candida krusei NOT DETECTED NOT DETECTED Final   Candida parapsilosis NOT DETECTED NOT DETECTED Final   Candida tropicalis NOT DETECTED NOT DETECTED Final   Cryptococcus neoformans/gattii NOT DETECTED NOT DETECTED Final   CTX-M ESBL NOT DETECTED NOT DETECTED Final   Carbapenem resistance IMP NOT DETECTED NOT DETECTED Final   Carbapenem resistance KPC NOT DETECTED NOT DETECTED Final   Carbapenem resistance NDM NOT DETECTED NOT DETECTED Final   Carbapenem resist OXA 48 LIKE NOT DETECTED NOT DETECTED Final   Carbapenem resistance VIM NOT DETECTED  NOT DETECTED Final    Comment: Performed at [facility], [place] [address], [place], [place] [address]  Resp Panel by RT-PCR (Flu A&B, Covid) Nasopharyngeal Swab     Status: None   Collection Time: 02/27/21  1:08 PM   Specimen: Nasopharyngeal Swab; Nasopharyngeal(NP) swabs in vial transport medium  Result Value Ref Range Status   SARS Coronavirus 2 by RT PCR NEGATIVE NEGATIVE Final    Comment: (NOTE) SARS-CoV-2 target nucleic acids are NOT DETECTED.  The SARS-CoV-2 RNA is generally detectable in upper respiratory specimens during the acute phase of infection. The lowest concentration of SARS-CoV-2 viral copies this assay can detect is 138 copies/mL. A negative result does not preclude SARS-Cov-2 infection and should not be used as the sole basis for treatment or other patient management decisions. A negative result may occur with  improper specimen collection/handling, submission of specimen other than nasopharyngeal swab, presence of viral mutation(s) within the areas targeted by this assay, and inadequate number of viral copies(<138 copies/mL). A negative result must be combined with clinical observations, patient history, and epidemiological information. The expected result is Negative.  Fact Sheet for Patients:  EntrepreneurPulse.com.au  Fact Sheet for Healthcare Providers:   IncredibleEmployment.be  This test is no t yet approved or cleared by the [place] FDA and  has been authorized for detection and/or diagnosis of SARS-CoV-2 by FDA under an Emergency Use Authorization (EUA). This EUA will remain  in effect (meaning this test can be used) for the duration of the COVID-19 declaration under Section 564(b)(1) of the Act, 21 U.S.C.section 360bbb-3(b)(1), unless the authorization is terminated  or revoked sooner.       Influenza A by PCR NEGATIVE NEGATIVE Final   Influenza B by PCR NEGATIVE NEGATIVE Final    Comment: (NOTE) The Xpert Xpress SARS-CoV-2/FLU/RSV plus assay is intended as an aid in the diagnosis of influenza from Nasopharyngeal swab specimens and should not be used as a sole basis for treatment. Nasal washings and aspirates are unacceptable for Xpert Xpress SARS-CoV-2/FLU/RSV testing.  Fact Sheet for Patients: EntrepreneurPulse.com.au  Fact Sheet for Healthcare Providers: IncredibleEmployment.be  This test is not yet approved or cleared by the [place] FDA and has been authorized for detection and/or diagnosis of SARS-CoV-2 by FDA under an Emergency Use Authorization (EUA). This EUA will remain in effect (meaning this test can be used) for the duration of the COVID-19 declaration under Section 564(b)(1) of the Act, 21 U.S.C. section 360bbb-3(b)(1), unless the authorization is terminated or revoked.  Performed at [facility], [address], [place], [place] [address]      Labs: Basic Metabolic Panel: Recent Labs  Lab 02/27/21 1030 02/28/21 0401 03/01/21 0328 03/02/21 0413 03/03/21 0345  NA 138 139 134* 136 138  K 4.3 3.6 3.8 3.3* 4.2  CL 102 105 104 103 106  CO2 23 27 21* 24 25  GLUCOSE 192* 114* 192* 147* 130*  BUN 15 11 7* 7* 9  CREATININE 1.14* 0.86 0.87 0.73 0.81  CALCIUM 10.1 8.8* 8.7* 8.6* 9.2  MG  --   --  1.6* 1.9 1.9   Liver  Function Tests: Recent Labs  Lab 02/27/21 1030 02/28/21 0401 03/01/21 0328  AST _0 ALT _1 ALKPHOS 56 64 52  BILITOT 0.6 0.5 0.3  PROT 7.6 6.0* 6.5  ALBUMIN 4.1 2.8* 2.8*   CBC: Recent Labs  Lab 02/27/21 1030 02/28/21 0401 03/01/21 0328 03/02/21 0413  WBC 20.7* 15.0* 11.4*  11.0*  HGB 12.7 10.2* 10.9* 10.9*  HCT 38.9 31.5* 32.6* 33.5*  MCV 87.0 88.0 86.5 87.7  PLT 294 249 256 285   CBG: Recent Labs  Lab 03/02/21 0732 03/02/21 1149 03/02/21 1708 03/02/21 2108 03/03/21 0734  GLUCAP 144* 155* 256* 110* 155*   Hgb A1c No results for input(s): HGBA1C in the last 72 hours. Lipid Profile No results for input(s): CHOL, HDL, LDLCALC, TRIG, CHOLHDL, LDLDIRECT in the last 72 hours. Thyroid function studies Recent Labs    03/01/21 1113  TSH 0.663   Urinalysis    Component Value Date/Time   COLORURINE YELLOW 02/27/2021 1115   APPEARANCEUR CLOUDY (A) 02/27/2021 1115   LABSPEC 1.020 02/27/2021 1115   PHURINE 6.0 02/27/2021 1115   GLUCOSEU NEGATIVE 02/27/2021 1115   HGBUR MODERATE (A) 02/27/2021 1115   BILIRUBINUR NEGATIVE 02/27/2021 1115   KETONESUR 15 (A) 02/27/2021 1115   PROTEINUR 100 (A) 02/27/2021 1115   UROBILINOGEN 0.2 09/27/2012 1638   NITRITE NEGATIVE 02/27/2021 1115   LEUKOCYTESUR LARGE (A) 02/27/2021 1115    FURTHER DISCHARGE INSTRUCTIONS:   Get Medicines reviewed and adjusted: Please take all your medications with you for your next visit with your Primary MD   Laboratory/radiological data: Please request your Primary MD to go over all hospital tests and procedure/radiological results at the follow up, please ask your Primary MD to get all Hospital records sent to his/her office.   In some cases, they will be blood work, cultures and biopsy results pending at the time of your discharge. Please request that your primary care M.D. goes through all the records of your hospital data and follows up on these results.   Also Note the  following: If you experience worsening of your admission symptoms, develop shortness of breath, life threatening emergency, suicidal or homicidal thoughts you must seek medical attention immediately by calling 911 or calling your MD immediately  if symptoms less severe.   You must read complete instructions/literature along with all the possible adverse reactions/side effects for all the Medicines you take and that have been prescribed to you. Take any new Medicines after you have completely understood and accpet all the possible adverse reactions/side effects.    Do not drive when taking Pain medications or sleeping medications (Benzodaizepines)   Do not take more than prescribed Pain, Sleep and Anxiety Medications. It is not advisable to combine anxiety,sleep and pain medications without talking with your primary care practitioner   Special Instructions: If you have smoked or chewed Tobacco  in the last 2 yrs please stop smoking, stop any regular Alcohol  and or any Recreational drug use.   Wear Seat belts while driving.   Please note: You were cared for by a hospitalist during your hospital stay. Once you are discharged, your primary care physician will handle any further medical issues. Please note that NO REFILLS for any discharge medications will be authorized once you are discharged, as it is imperative that you return to your primary care physician (or establish a relationship with a primary care physician if you do not have one) for your post hospital discharge needs so that they can reassess your need for medications and monitor your lab values.  Time coordinating discharge: 40 minutes  SIGNED:  Marzetta Board, MD, PhD 03/03/2021, 10:16 AM

## 2021-03-20 DIAGNOSIS — E785 Hyperlipidemia, unspecified: Secondary | ICD-10-CM | POA: Diagnosis not present

## 2021-03-20 DIAGNOSIS — E039 Hypothyroidism, unspecified: Secondary | ICD-10-CM | POA: Diagnosis not present

## 2021-03-20 DIAGNOSIS — E1169 Type 2 diabetes mellitus with other specified complication: Secondary | ICD-10-CM | POA: Diagnosis not present

## 2021-03-20 DIAGNOSIS — I471 Supraventricular tachycardia: Secondary | ICD-10-CM | POA: Diagnosis not present

## 2021-03-20 DIAGNOSIS — I10 Essential (primary) hypertension: Secondary | ICD-10-CM | POA: Diagnosis not present

## 2021-03-20 DIAGNOSIS — Z23 Encounter for immunization: Secondary | ICD-10-CM | POA: Diagnosis not present

## 2021-03-20 DIAGNOSIS — E43 Unspecified severe protein-calorie malnutrition: Secondary | ICD-10-CM | POA: Diagnosis not present

## 2021-03-20 DIAGNOSIS — R54 Age-related physical debility: Secondary | ICD-10-CM | POA: Diagnosis not present

## 2021-03-20 DIAGNOSIS — J449 Chronic obstructive pulmonary disease, unspecified: Secondary | ICD-10-CM | POA: Diagnosis not present

## 2021-03-21 DIAGNOSIS — E1165 Type 2 diabetes mellitus with hyperglycemia: Secondary | ICD-10-CM | POA: Diagnosis not present

## 2021-04-13 DIAGNOSIS — H40023 Open angle with borderline findings, high risk, bilateral: Secondary | ICD-10-CM | POA: Diagnosis not present

## 2021-04-13 DIAGNOSIS — Z961 Presence of intraocular lens: Secondary | ICD-10-CM | POA: Diagnosis not present

## 2021-04-13 DIAGNOSIS — E119 Type 2 diabetes mellitus without complications: Secondary | ICD-10-CM | POA: Diagnosis not present

## 2021-04-16 NOTE — Progress Notes (Signed)
Primary Physician/Referring:  Donald Prose, MD  Patient ID: Hannah [name], female    DOB: [birth year], 87 y.o.   MRN: [id number]  Chief Complaint  Patient presents with   Shortness of Breath   Hospitalization Follow-up   SVT    HPI:    Hannah [name]  is a 87 y.o.  [place] female with mild dementia, h/o oral cancer in remission, type 2 DM, depression, with worsening exertional dyspnea and fatigue symptoms.  Cardiac workup showed echocardiogram with normal LF, grade II diastolic dysfunction, no ischemia/infarct on stress test. High resolution CT had revealed no pulmonary abnormality (H/O TB), but revealed aortic atherosclerosis and LAD calcification on 04/30/2019. Right and left heart catheterization on 09/18/2019 and found to have moderately elevated LVEDP and normal coronary arteries and normal right heart pressures.    Patient was admitted with UTI on 02/27/2021 and discharged 4 days later when she presented with nausea, vomiting and failure to thrive.  She had developed pyelonephritis with E. coli bacteremia.  While in the hospital she had several bursts of SVT which improved with addition of metoprolol as well as diltiazem.  She now presents for follow-up of the same.   She is presently doing well, except for occasional episodes of wheezing and fatigue no other specific complaints.  She has not had any further palpitations, dizziness or syncope.  Daughter is present.  Past Medical History:  Diagnosis Date   Depression    Diabetes mellitus    Hypercholesteremia    Hypertension    Hypothyroidism    Oral cancer ([place]) 2012   surgery and radiation   Spinal stenosis    Tuberculosis    1978   Weight loss, unintentional    Past Surgical History:  Procedure Laterality Date   AORTIC ARCH ANGIOGRAPHY  09/2019   COLONOSCOPY     EYE SURGERY Bilateral    cataracts   LIPOMA EXCISION     back   LUMBAR LAMINECTOMY/DECOMPRESSION MICRODISCECTOMY Right 04/12/2018   Procedure:  Laminectomy and Foraminotomy - Lumbar four-Lumbar five - Lumbar five-Sacral one - right;  Surgeon: Eustace Moore, MD;  Location: [facility];  Service: Neurosurgery;  Laterality: Right;   LUMBAR SPINE SURGERY  05/2019   MASS EXCISION Left 04/02/2019   Procedure: EXCISION OF LIPOMATOUS MASS OF THE LEFT BACK;  Surgeon: Johnathan Hausen, MD;  Location: [facility];  Service: General;  Laterality: Left;   OTHER SURGICAL HISTORY     squamous cell removal;skin graft   RIGHT/LEFT HEART CATH AND CORONARY ANGIOGRAPHY N/A 09/18/2019   Procedure: RIGHT/LEFT HEART CATH AND CORONARY ANGIOGRAPHY;  Surgeon: Adrian Prows, MD;  Location: [facility];  Service: Cardiovascular;  Laterality: N/A;   TOOTH EXTRACTION     all pulled   Family History  Problem Relation Age of Onset   Arthritis Mother        Late 72s   Heart attack Father        Deceased, 88   Heart disease Brother    Hypertension Other    Hyperlipidemia Other    Diabetes type II Sister    Diabetes Brother     Social History   Tobacco Use   Smoking status: Never   Smokeless tobacco: Never  Substance Use Topics   Alcohol use: No    Alcohol/week: 0.0 standard drinks   Marital Status: Widowed ROS  Review of Systems  Constitutional: Positive for malaise/fatigue.  Cardiovascular:  Positive for dyspnea on exertion. Negative  for chest pain and leg swelling.  Respiratory:  Positive for wheezing.   Gastrointestinal:  Negative for melena.  Objective  Blood pressure 135/71, pulse 90, temperature 98 F (36.7 C), resp. rate 16, height 5' 2"  (1.575 m), weight 94 lb (42.6 kg), SpO2 98 %.  Vitals with BMI 04/17/2021 03/03/2021 03/03/2021  Height 5' 2"  - -  Weight 94 lbs - -  BMI 28.78 - -  Systolic 676 720 947  Diastolic 71 57 50  Pulse 90 70 83     Physical Exam Constitutional:      Comments: She is short stature and well-nourished, appears to be no acute distress.  Cardiovascular:     Rate and Rhythm: Normal rate and regular  rhythm.     Pulses: Intact distal pulses.     Heart sounds: Normal heart sounds. No murmur heard.   No gallop.     Comments: No leg edema, no JVD. Pulmonary:     Effort: Pulmonary effort is normal.     Breath sounds: Examination of the left-lower field reveals wheezing and rhonchi. Wheezing and rhonchi present.  Abdominal:     General: Bowel sounds are normal.     Palpations: Abdomen is soft.   Laboratory examination:   Recent Labs    03/01/21 0328 03/02/21 0413 03/03/21 0345  NA 134* 136 138  K 3.8 3.3* 4.2  CL 104 103 106  CO2 21* 24 25  GLUCOSE 192* 147* 130*  BUN 7* 7* 9  CREATININE 0.87 0.73 0.81  CALCIUM 8.7* 8.6* 9.2  GFRNONAA >60 >60 >60   CrCl cannot be calculated (Patient's most recent lab result is older than the maximum 21 days allowed.).  CMP Latest Ref Rng & Units 03/03/2021 03/02/2021 03/01/2021  Glucose 70 - 99 mg/dL 130(H) 147(H) 192(H)  BUN 8 - 23 mg/dL 9 7(L) 7(L)  Creatinine 0.44 - 1.00 mg/dL 0.81 0.73 0.87  Sodium 135 - 145 mmol/L 138 136 134(L)  Potassium 3.5 - 5.1 mmol/L 4.2 3.3(L) 3.8  Chloride 98 - 111 mmol/L 106 103 104  CO2 22 - 32 mmol/L 25 24 21(L)  Calcium 8.9 - 10.3 mg/dL 9.2 8.6(L) 8.7(L)  Total Protein 6.5 - 8.1 g/dL - - 6.5  Total Bilirubin 0.3 - 1.2 mg/dL - - 0.3  Alkaline Phos 38 - 126 U/L - - 52  AST 15 - 41 U/L - - 28  ALT 0 - 44 U/L - - 20   CBC Latest Ref Rng & Units 03/02/2021 03/01/2021 02/28/2021  WBC 4.0 - 10.5 K/uL 11.0(H) 11.4(H) 15.0(H)  Hemoglobin 12.0 - 15.0 g/dL 10.9(L) 10.9(L) 10.2(L)  Hematocrit 36.0 - 46.0 % 33.5(L) 32.6(L) 31.5(L)  Platelets 150 - 400 K/uL 285 256 249   Lipid Panel  No results found for: CHOL, TRIG, HDL, CHOLHDL, VLDL, LDLCALC, LDLDIRECT HEMOGLOBIN A1C No results found for: HGBA1C, MPG TSH Recent Labs    03/01/21 1113  TSH 0.663   External labs:   09/14/2019: eGFR 63, Glucose 242, Creatinine 0.85, BUN 11. Platelets 267, RBC 4.2.  Cholesterol, total 330.000 03/19/2019 Triglycerides  256.000 03/19/2019 HDL 43.000 03/19/2019 LDL 236.000 03/19/2019  A1C 8.500 03/19/2019 TSH 3.130 03/19/2019  Hemoglobin 11.500 07/18/2019 INR 1.000 04/06/2018 Platelets 334.000 04/19/2019  Creatinine, Serum 0.870 MG/ 05/02/2019 Potassium 4.800 03/29/2019 Magnesium N/D ALT (SGPT) 14.000 03/19/2019  04/28/2018:  Cholesterol 171, triglycerides 155, HDL 51, LDL 87.   Hemoglobin A1c 7.9.  CBC normal.  Creatinine 0.73, EGFR 78/92, potassium 4.1, CMP normal.  TSH 3.9.  Medications and allergies   Allergies  Allergen Reactions   Sulfur    Penicillins Rash    1980's   Sulfa Antibiotics Rash     Current Outpatient Medications  Medication Instructions   acetaminophen (TYLENOL) 650 mg, Oral, Every 6 hours PRN   albuterol (VENTOLIN HFA) 108 (90 Base) MCG/ACT inhaler 2 puffs, Inhalation, Every 6 hours PRN   augmented betamethasone dipropionate (DIPROLENE-AF) 0.24 % cream 1 application, Topical, Daily PRN   budesonide-formoterol (SYMBICORT) 160-4.5 MCG/ACT inhaler 2 puffs, Inhalation, 2 times daily   cyanocobalamin 1,000 mcg, Oral, Daily   diclofenac sodium (VOLTAREN) 2 g, Topical, 3 times daily PRN   ezetimibe-simvastatin (VYTORIN) 10-20 MG per tablet 1 tablet, Oral, Daily   insulin lispro (HUMALOG) 2-10 Units, Subcutaneous, 3 times daily, Sliding scale   Lantus SoloStar 10 Units, Subcutaneous, Daily at bedtime   latanoprost (XALATAN) 0.005 % ophthalmic solution 1 drop, Both Eyes, Daily at bedtime   levothyroxine (SYNTHROID) 50 mcg, Oral, See admin instructions, Take 63mg alternating 531m every other day.   lidocaine (XYLOCAINE) 2 % solution 3 mLs, Mouth/Throat, Daily PRN   megestrol (MEGACE) 400 mg, Oral, Daily   metoprolol tartrate (LOPRESSOR) 25 mg, Oral, 2 times daily   Myrbetriq 25 mg, Oral, Daily   sitaGLIPtin (JANUVIA) 50 mg, Oral, Daily   Vitamin D-3 1,000 mg, Oral, Daily   Radiology:   No results found.  Cardiac Studies:   Lexiscan myoview stress test 08/07/2018:  1.  Lexiscan stress test was performed. Exercise capacity was not assessed. Stress symptoms included dizziness and headache. Resting blood pressure was 112/60 mmHg and peak effect blood pressure was 120/62 mmHg. The resting and stress electrocardiogram demonstrated normal sinus rhythm, normal conduction, no arrhythmias and normal repolarization. Stress EKG is non diagnostic for ischemia as it is a pharmacologic stress.  2. The overall quality of the study is good. There is no evidence of abnormal lung activity. Stress and rest SPECT images demonstrate homogeneous tracer distribution throughout the myocardium. Gated SPECT imaging reveals normal myocardial thickening and wall motion. The left ventricular ejection fraction was normal (62%).   3. Low risk study.  Left and Right Heart Catheterization 09/18/19:  LVEDP moderately elevated, 20 mmHg.  LV gram not performed to conserve contrast. Normal coronary arteries right dominant circulation.  Small coronary arteries with brisk flow without evidence of atherosclerosis. Normal right heart catheterization.  RA 6/5, mean 4; RV 22/-1, EDP 2; PA 23/1, mean 13, PA saturation 68%.;  PW 8/10, mean 8 mmHg.  Aortic saturation 1/2%. Cardiac output 3.57, cardiac index 2.59, QP/QS 1.0, normal.  Impression: Dyspnea on exertion is related to diastolic dysfunction with elevated LVEDP.  We will start the patient on gentle diuretics.  She would benefit from being on ACE inhibitor's however has underlying hyperkalemia. We will try to control her blood pressure aggressively, amlodipine will be another choice.  She will be discharged home with outpatient follow-up, 20 mL contrast utilized.  Echocardiogram 03/01/2021:  Normal size LV. LVEF 55-60%. Grade 1 DD Normal RV size and systolic function. Inadequate TR jet to estimate PASP No significant valvular abnormality Normal right atrial pressure.  No significant change from 10/26/2018, mild to moderate MR not present.   EKG:  EKG  04/17/2021: Normal sinus rhythm with rate of 85 bpm, normal axis.  Poor R progression, probably normal variant.  No evidence of ischemia.  Normal EKG.   Assessment     ICD-10-CM   1. SVT (supraventricular tachycardia) (HCC)  I47.1 EKG 12-Lead  2. Chronic diastolic (congestive) heart failure (HCC)  I50.32     3. Mild intermittent reactive airway disease with acute exacerbation  J45.21 budesonide-formoterol (SYMBICORT) 160-4.5 MCG/ACT inhaler    4. Primary hypertension  I10        Meds ordered this encounter  Medications   budesonide-formoterol (SYMBICORT) 160-4.5 MCG/ACT inhaler    Sig: Inhale 2 puffs into the lungs 2 (two) times daily.    Dispense:  1 each    Refill:  6     Medications Discontinued During This Encounter  Medication Reason   cefadroxil (DURICEF) 500 MG capsule    diltiazem (CARDIZEM CD) 120 MG 24 hr capsule    budesonide-formoterol (SYMBICORT) 160-4.5 MCG/ACT inhaler Reorder     Recommendations:   Hannah [name]  is a 87 y.o. [place] female with mild dementia, h/o oral cancer in remission, type 2 DM, depression, with worsening exertional dyspnea and fatigue symptoms.  Cardiac workup showed echocardiogram with normal LF, grade II diastolic dysfunction, no ischemia/infarct on stress test. High resolution CT had revealed no pulmonary abnormality (H/O TB), but revealed aortic atherosclerosis and LAD calcification on 04/30/2019. Right and left heart catheterization on 09/18/2019 and found to have moderately elevated LVEDP and normal coronary arteries and normal right heart pressures.    Patient was admitted with UTI on 02/27/2021 and discharged 4 days later when she presented with nausea, vomiting and failure to thrive.  She had developed pyelonephritis with E. coli bacteremia.  While in the hospital she had several bursts of SVT which improved with addition of metoprolol as well as diltiazem.  She now presents for follow-up of the same.  Physical examination  today reveals no clinical evidence of heart failure, she still has faint left basal wheezing.  Continue pulmonary toilet.  With regard to brief episodes of atrial tachycardia/SVT, she is presently on metoprolol low-dose, continue the same.  Today's EKG reveals normal sinus rhythm.  Blood pressure is also well controlled.  No changes are needed.  I will continue to see her back on a as needed basis.  I have refilled the prescription for Symbicort.   Adrian Prows, MD, [facility] 04/17/2021, 9:07 AM Office: [phone] Fax: [phone] Pager: [phone]

## 2021-04-17 ENCOUNTER — Other Ambulatory Visit: Payer: Self-pay

## 2021-04-17 ENCOUNTER — Ambulatory Visit: Payer: Medicare Other | Admitting: Cardiology

## 2021-04-17 ENCOUNTER — Encounter: Payer: Self-pay | Admitting: Cardiology

## 2021-04-17 VITALS — BP 135/71 | HR 90 | Temp 98.0°F | Resp 16 | Ht 62.0 in | Wt 94.0 lb

## 2021-04-17 DIAGNOSIS — I5032 Chronic diastolic (congestive) heart failure: Secondary | ICD-10-CM | POA: Diagnosis not present

## 2021-04-17 DIAGNOSIS — R0602 Shortness of breath: Secondary | ICD-10-CM

## 2021-04-17 DIAGNOSIS — I471 Supraventricular tachycardia: Secondary | ICD-10-CM | POA: Diagnosis not present

## 2021-04-17 DIAGNOSIS — I10 Essential (primary) hypertension: Secondary | ICD-10-CM

## 2021-04-17 DIAGNOSIS — J4521 Mild intermittent asthma with (acute) exacerbation: Secondary | ICD-10-CM | POA: Diagnosis not present

## 2021-04-17 MED ORDER — BUDESONIDE-FORMOTEROL FUMARATE 160-4.5 MCG/ACT IN AERO: 2.0000 | INHALATION_SPRAY | Freq: Two times a day (BID) | RESPIRATORY_TRACT | 6 refills | Status: DC

## 2021-04-20 DIAGNOSIS — E039 Hypothyroidism, unspecified: Secondary | ICD-10-CM | POA: Diagnosis not present

## 2021-04-20 DIAGNOSIS — I10 Essential (primary) hypertension: Secondary | ICD-10-CM | POA: Diagnosis not present

## 2021-04-20 DIAGNOSIS — E785 Hyperlipidemia, unspecified: Secondary | ICD-10-CM | POA: Diagnosis not present

## 2021-04-20 DIAGNOSIS — Z794 Long term (current) use of insulin: Secondary | ICD-10-CM | POA: Diagnosis not present

## 2021-04-20 DIAGNOSIS — E1169 Type 2 diabetes mellitus with other specified complication: Secondary | ICD-10-CM | POA: Diagnosis not present

## 2021-04-21 DIAGNOSIS — E1165 Type 2 diabetes mellitus with hyperglycemia: Secondary | ICD-10-CM | POA: Diagnosis not present

## 2021-05-22 DIAGNOSIS — E1165 Type 2 diabetes mellitus with hyperglycemia: Secondary | ICD-10-CM | POA: Diagnosis not present

## 2021-05-29 DIAGNOSIS — E1169 Type 2 diabetes mellitus with other specified complication: Secondary | ICD-10-CM | POA: Diagnosis not present

## 2021-05-29 DIAGNOSIS — R110 Nausea: Secondary | ICD-10-CM | POA: Diagnosis not present

## 2021-05-29 DIAGNOSIS — I10 Essential (primary) hypertension: Secondary | ICD-10-CM | POA: Diagnosis not present

## 2021-05-29 DIAGNOSIS — E785 Hyperlipidemia, unspecified: Secondary | ICD-10-CM | POA: Diagnosis not present

## 2021-05-29 DIAGNOSIS — R1013 Epigastric pain: Secondary | ICD-10-CM | POA: Diagnosis not present

## 2021-06-12 ENCOUNTER — Emergency Department (HOSPITAL_BASED_OUTPATIENT_CLINIC_OR_DEPARTMENT_OTHER)
Admission: EM | Admit: 2021-06-12 | Discharge: 2021-06-12 | Disposition: A | Discharge status: Home / Self Care | Payer: Medicare Other | Attending: Emergency Medicine | Admitting: Emergency Medicine

## 2021-06-12 ENCOUNTER — Encounter (HOSPITAL_BASED_OUTPATIENT_CLINIC_OR_DEPARTMENT_OTHER): Payer: Self-pay

## 2021-06-12 ENCOUNTER — Emergency Department (HOSPITAL_BASED_OUTPATIENT_CLINIC_OR_DEPARTMENT_OTHER): Payer: Medicare Other

## 2021-06-12 ENCOUNTER — Emergency Department (HOSPITAL_BASED_OUTPATIENT_CLINIC_OR_DEPARTMENT_OTHER): Payer: Medicare Other | Admitting: Radiology

## 2021-06-12 ENCOUNTER — Other Ambulatory Visit: Payer: Self-pay

## 2021-06-12 DIAGNOSIS — Z20822 Contact with and (suspected) exposure to covid-19: Secondary | ICD-10-CM | POA: Insufficient documentation

## 2021-06-12 DIAGNOSIS — R059 Cough, unspecified: Secondary | ICD-10-CM | POA: Diagnosis not present

## 2021-06-12 DIAGNOSIS — K449 Diaphragmatic hernia without obstruction or gangrene: Secondary | ICD-10-CM | POA: Diagnosis not present

## 2021-06-12 DIAGNOSIS — K5730 Diverticulosis of large intestine without perforation or abscess without bleeding: Secondary | ICD-10-CM | POA: Diagnosis not present

## 2021-06-12 DIAGNOSIS — R5383 Other fatigue: Secondary | ICD-10-CM | POA: Insufficient documentation

## 2021-06-12 DIAGNOSIS — F0390 Unspecified dementia without behavioral disturbance: Secondary | ICD-10-CM | POA: Insufficient documentation

## 2021-06-12 DIAGNOSIS — E039 Hypothyroidism, unspecified: Secondary | ICD-10-CM | POA: Diagnosis not present

## 2021-06-12 DIAGNOSIS — R079 Chest pain, unspecified: Secondary | ICD-10-CM | POA: Diagnosis not present

## 2021-06-12 DIAGNOSIS — R944 Abnormal results of kidney function studies: Secondary | ICD-10-CM | POA: Diagnosis not present

## 2021-06-12 DIAGNOSIS — J4520 Mild intermittent asthma, uncomplicated: Secondary | ICD-10-CM | POA: Insufficient documentation

## 2021-06-12 DIAGNOSIS — R1084 Generalized abdominal pain: Secondary | ICD-10-CM

## 2021-06-12 DIAGNOSIS — R110 Nausea: Secondary | ICD-10-CM | POA: Insufficient documentation

## 2021-06-12 DIAGNOSIS — E119 Type 2 diabetes mellitus without complications: Secondary | ICD-10-CM | POA: Diagnosis not present

## 2021-06-12 DIAGNOSIS — R112 Nausea with vomiting, unspecified: Secondary | ICD-10-CM

## 2021-06-12 DIAGNOSIS — I110 Hypertensive heart disease with heart failure: Secondary | ICD-10-CM | POA: Diagnosis not present

## 2021-06-12 DIAGNOSIS — I5032 Chronic diastolic (congestive) heart failure: Secondary | ICD-10-CM | POA: Diagnosis not present

## 2021-06-12 DIAGNOSIS — Z794 Long term (current) use of insulin: Secondary | ICD-10-CM | POA: Insufficient documentation

## 2021-06-12 DIAGNOSIS — Z79899 Other long term (current) drug therapy: Secondary | ICD-10-CM | POA: Diagnosis not present

## 2021-06-12 DIAGNOSIS — M79601 Pain in right arm: Secondary | ICD-10-CM | POA: Diagnosis not present

## 2021-06-12 DIAGNOSIS — Z85818 Personal history of malignant neoplasm of other sites of lip, oral cavity, and pharynx: Secondary | ICD-10-CM | POA: Insufficient documentation

## 2021-06-12 DIAGNOSIS — R072 Precordial pain: Secondary | ICD-10-CM | POA: Diagnosis not present

## 2021-06-12 DIAGNOSIS — D72829 Elevated white blood cell count, unspecified: Secondary | ICD-10-CM | POA: Diagnosis not present

## 2021-06-12 DIAGNOSIS — M19011 Primary osteoarthritis, right shoulder: Secondary | ICD-10-CM | POA: Diagnosis not present

## 2021-06-12 LAB — URINALYSIS, ROUTINE W REFLEX MICROSCOPIC
Bilirubin Urine: NEGATIVE
Glucose, UA: NEGATIVE mg/dL
Hgb urine dipstick: NEGATIVE
Ketones, ur: 15 mg/dL — AB
Nitrite: NEGATIVE
Protein, ur: 30 mg/dL — AB
Specific Gravity, Urine: 1.031 — ABNORMAL HIGH (ref 1.005–1.030)
pH: 5.5 (ref 5.0–8.0)

## 2021-06-12 LAB — CBC
HCT: 40.1 % (ref 36.0–46.0)
Hemoglobin: 12.4 g/dL (ref 12.0–15.0)
MCH: 27.2 pg (ref 26.0–34.0)
MCHC: 30.9 g/dL (ref 30.0–36.0)
MCV: 87.9 fL (ref 80.0–100.0)
Platelets: 339 10*3/uL (ref 150–400)
RBC: 4.56 MIL/uL (ref 3.87–5.11)
RDW: 13.2 % (ref 11.5–15.5)
WBC: 12.1 10*3/uL — ABNORMAL HIGH (ref 4.0–10.5)
nRBC: 0 % (ref 0.0–0.2)

## 2021-06-12 LAB — RESP PANEL BY RT-PCR (FLU A&B, COVID) ARPGX2
Influenza A by PCR: NEGATIVE
Influenza B by PCR: NEGATIVE
SARS Coronavirus 2 by RT PCR: NEGATIVE

## 2021-06-12 LAB — COMPREHENSIVE METABOLIC PANEL
ALT: 9 U/L (ref 0–44)
AST: 27 U/L (ref 15–41)
Albumin: 4.2 g/dL (ref 3.5–5.0)
Alkaline Phosphatase: 53 U/L (ref 38–126)
Anion gap: 11 (ref 5–15)
BUN: 15 mg/dL (ref 8–23)
CO2: 27 mmol/L (ref 22–32)
Calcium: 9.9 mg/dL (ref 8.9–10.3)
Chloride: 101 mmol/L (ref 98–111)
Creatinine, Ser: 1.22 mg/dL — ABNORMAL HIGH (ref 0.44–1.00)
GFR, Estimated: 43 mL/min — ABNORMAL LOW (ref >60)
Glucose, Bld: 136 mg/dL — ABNORMAL HIGH (ref 70–99)
Potassium: 4.4 mmol/L (ref 3.5–5.1)
Sodium: 139 mmol/L (ref 135–145)
Total Bilirubin: 0.4 mg/dL (ref 0.3–1.2)
Total Protein: 7 g/dL (ref 6.5–8.1)

## 2021-06-12 LAB — LIPASE, BLOOD: Lipase: 21 U/L (ref 11–51)

## 2021-06-12 LAB — TROPONIN I (HIGH SENSITIVITY)
Troponin I (High Sensitivity): 5 ng/L (ref <18)
Troponin I (High Sensitivity): 5 ng/L (ref <18)

## 2021-06-12 LAB — CBG MONITORING, ED: Glucose-Capillary: 102 mg/dL — ABNORMAL HIGH (ref 70–99)

## 2021-06-12 MED ORDER — LACTATED RINGERS IV BOLUS
500.0000 mL | Freq: Once | INTRAVENOUS | Status: AC
  Administered 2021-06-12: 500 mL via INTRAVENOUS

## 2021-06-12 MED ORDER — PANTOPRAZOLE SODIUM 20 MG PO TBEC: 20.0000 mg | DELAYED_RELEASE_TABLET | Freq: Every day | ORAL | 0 refills | Status: DC

## 2021-06-12 MED ORDER — ALUM & MAG HYDROXIDE-SIMETH 200-200-20 MG/5ML PO SUSP
30.0000 mL | Freq: Once | ORAL | Status: AC
  Administered 2021-06-12: 30 mL via ORAL
  Filled 2021-06-12: qty 30

## 2021-06-12 MED ORDER — MYLANTA MAXIMUM STRENGTH 400-400-40 MG/5ML PO SUSP: 15.0000 mL | Freq: Four times a day (QID) | ORAL | 0 refills | Status: DC | PRN

## 2021-06-12 MED ORDER — LIDOCAINE VISCOUS HCL 2 % MT SOLN
15.0000 mL | Freq: Once | OROMUCOSAL | Status: AC
  Administered 2021-06-12: 15 mL via ORAL
  Filled 2021-06-12: qty 15

## 2021-06-12 MED ORDER — ONDANSETRON HCL 4 MG/2ML IJ SOLN
4.0000 mg | Freq: Once | INTRAMUSCULAR | Status: AC
  Administered 2021-06-12: 4 mg via INTRAVENOUS
  Filled 2021-06-12: qty 2

## 2021-06-12 MED ORDER — IOHEXOL 300 MG/ML  SOLN
100.0000 mL | Freq: Once | INTRAMUSCULAR | Status: AC | PRN
  Administered 2021-06-12: 60 mL via INTRAVENOUS

## 2021-06-12 NOTE — Discharge Instructions (Signed)
You came to the emergency department today to be evaluated for your chest pain, abdominal pain, and shoulder pain.  Your lab results were reassuring.  The x-ray of your right shoulder showed no broken bones or dislocations.  Your cardiac work-up was reassuring that you are not having acute heart attack today.  The CT scan of your abdomen pelvis showed no acute abnormalities.  Your pain improved after receiving medications in the emergency department.  I have given you prescription for Mylanta and Protonix to help with your abdominal discomfort.  Please follow-up with [facility] gastroenterology for further evaluation.  Due to your chest pain please follow-up with your cardiologist Dr. Einar Gip.  Additionally please follow-up with your primary care provider.  Get help right away if: Your pain does not go away as soon as your health care provider told you to expect. You cannot stop vomiting. Your pain is only in areas of the abdomen, such as the right side or the left lower portion of the abdomen. Pain on the right side could be caused by appendicitis. You have bloody or black stools, or stools that look like tar. You have severe pain, cramping, or bloating in your abdomen. You have signs of dehydration, such as: Dark urine, very little urine, or no urine. Cracked lips. Dry mouth. Sunken eyes. Sleepiness. Weakness. You have trouble breathing or chest pain.

## 2021-06-12 NOTE — ED Provider Notes (Signed)
[facility] EMERGENCY DEPT Provider Note   CSN: [id number] Arrival date & time: 06/12/21  1440     History  Chief Complaint  Patient presents with   Chest Pain   Abdominal Pain    Hannah [name] is a 88 y.o. female With past medical history of mild dementia, SVT, chronic diastolic congestive heart failure, mild intermittent reactive airway disease, diabetes mellitus type 2, hypertension, hypothyroidism, arthritis, oral cancer status postsurgery and radiation.  Heart cath May 2021 performed by Dr. Einar Gip.  Echocardiogram 02/2021, EF within normal limits.  Presents to the emergency department with a chief complaint of chest pain and abdominal pain.  Abdominal pain has been intermittent over the last 4 weeks.  Pain became worse yesterday.  Pain is located across her entire abdomen.  Denies any aggravating factors.  No change in pain with p.o. intake.  Patient has not had any modalities to alleviate her pain.  She has had nausea consistently over the last 4 weeks.  Due to her nausea patient has had decreased p.o. intake and weight loss.  Patient's daughter reports that she was taken to her primary care doctor 2 weeks prior diagnosed with urinary tract infection and started on antibiotics.  Patient was also given medication for nausea and has been taking with no improvement in her symptoms.  Patient has had midsternal chest pain over the last week.  Pain became worse yesterday and started radiating to her right arm.  No associated vomiting, diaphoresis, shortness of breath.  Patient has had a little bit of a cough is nonproductive.  Endorses fatigue.  No fever, chills, rhinorrhea, nasal congestion, sore throat, leg swelling or tenderness, palpitations, constipation, diarrhea, blood in stool, melena, abdominal distention, dysuria, hematuria, urinary urgency, vaginal pain, vaginal bleeding, vaginal discharge, lightheadedness, dizziness, recent falls or traumatic  injuries.  Information was obtained from patient and patient's daughter at bedside.  Patient's daughter translated for patient.   Chest Pain Associated symptoms: abdominal pain, cough, fatigue and nausea   Associated symptoms: no back pain, no dizziness, no fever, no headache, no palpitations, no shortness of breath and no vomiting   Abdominal Pain Associated symptoms: chest pain, cough, fatigue, nausea and sore throat   Associated symptoms: no chills, no constipation, no diarrhea, no dysuria, no fever, no hematuria, no shortness of breath, no vaginal bleeding, no vaginal discharge and no vomiting       Home Medications Prior to Admission medications   Medication Sig Start Date End Date Taking? Authorizing Provider  acetaminophen (TYLENOL) 325 MG tablet Take 650 mg by mouth every 6 (six) hours as needed for mild pain.     [provider]  albuterol (VENTOLIN HFA) 108 (90 Base) MCG/ACT inhaler Inhale 2 puffs into the lungs every 6 (six) hours as needed for wheezing or shortness of breath. 02/20/19   Garner Nash, DO  augmented betamethasone dipropionate (DIPROLENE-AF) 0.05 % cream Apply 1 application topically daily as needed (rash).  07/21/18   [provider]  budesonide-formoterol (SYMBICORT) 160-4.5 MCG/ACT inhaler Inhale 2 puffs into the lungs 2 (two) times daily. 04/17/21   Adrian Prows, MD  Cholecalciferol (VITAMIN D-3) 125 MCG (5000 UT) TABS Take 1,000 mg by mouth daily.    [provider]  cyanocobalamin 1000 MCG tablet Take 1,000 mcg by mouth daily.    [provider]  diclofenac sodium (VOLTAREN) 1 % GEL Apply 2 g topically 3 (three) times daily as needed (joint pain).    [provider]  ezetimibe-simvastatin (VYTORIN) 10-20 MG per tablet Take 1 tablet by mouth daily.     [provider]  insulin glargine (LANTUS SOLOSTAR) 100 UNIT/ML Solostar Pen Inject 10 Units into the skin at bedtime.    [provider]  insulin  lispro (HUMALOG) 100 UNIT/ML KwikPen Inject 2-10 Units into the skin 3 (three) times daily. Sliding scale    [provider]  latanoprost (XALATAN) 0.005 % ophthalmic solution Place 1 drop into both eyes at bedtime. 02/20/21   [provider]  levothyroxine (SYNTHROID) 50 MCG tablet Take 1 tablet (50 mcg total) by mouth See admin instructions. Take 48mcg alternating 39mcg every other day. 03/03/21   Caren Griffins, MD  lidocaine (XYLOCAINE) 2 % solution Use as directed 3 mLs in the mouth or throat daily as needed for mouth pain. 04/28/18   [provider]  megestrol (MEGACE) 400 MG/10ML suspension Take 400 mg by mouth daily.     [provider]  metoprolol tartrate (LOPRESSOR) 25 MG tablet Take 1 tablet (25 mg total) by mouth 2 (two) times daily. 03/03/21   Caren Griffins, MD  MYRBETRIQ 25 MG TB24 tablet Take 25 mg by mouth daily. 12/03/20   [provider]  sitaGLIPtin (JANUVIA) 25 MG tablet Take 50 mg by mouth daily.    [provider]      Allergies    Sulfur, Penicillins, and Sulfa antibiotics    Review of Systems   Review of Systems  Constitutional:  Positive for appetite change and fatigue. Negative for chills and fever.  HENT:  Positive for rhinorrhea and sore throat. Negative for congestion.   Eyes:  Negative for visual disturbance.  Respiratory:  Positive for cough. Negative for shortness of breath.   Cardiovascular:  Positive for chest pain. Negative for palpitations and leg swelling.  Gastrointestinal:  Positive for abdominal pain and nausea. Negative for abdominal distention, anal bleeding, blood in stool, constipation, diarrhea, rectal pain and vomiting.  Genitourinary:  Negative for difficulty urinating, dysuria, flank pain, hematuria, pelvic pain, urgency, vaginal bleeding, vaginal discharge and vaginal pain.  Musculoskeletal:  Negative for back pain and neck pain.  Skin:  Negative for color change and rash.   Neurological:  Negative for dizziness, syncope, light-headedness and headaches.  Psychiatric/Behavioral:  Negative for confusion.    Physical Exam Updated Vital Signs BP (!) 157/61 (BP Location: Right Arm)    Pulse (!) 58    Temp (!) 97.4 F (36.3 C) (Axillary)    Resp 16    Ht 5\' 2"  (1.575 m)    Wt 39.5 kg    SpO2 100%    BMI 15.91 kg/m   Physical Exam Vitals and nursing note reviewed.  Constitutional:      General: She is not in acute distress.    Appearance: She is not ill-appearing, toxic-appearing or diaphoretic.  HENT:     Head: Normocephalic.  Eyes:     General: No scleral icterus.       Right eye: No discharge.        Left eye: No discharge.  Cardiovascular:     Rate and Rhythm: Normal rate.     Pulses:          Radial pulses are 2+ on the right side and 2+ on the left side.  Pulmonary:     Effort: Pulmonary effort is normal. No tachypnea or bradypnea.     Breath sounds: Normal breath sounds. No stridor.  Abdominal:     General:  Abdomen is flat. Bowel sounds are increased. There is no distension. There are no signs of injury.     Palpations: Abdomen is soft. There is no mass or pulsatile mass.     Tenderness: There is generalized abdominal tenderness. There is no right CVA tenderness, left CVA tenderness, guarding or rebound.     Hernia: There is no hernia in the umbilical area or ventral area.  Musculoskeletal:     Right lower leg: Normal.     Left lower leg: Normal.  Skin:    General: Skin is warm and dry.  Neurological:     General: No focal deficit present.     Mental Status: She is alert.     GCS: GCS eye subscore is 4. GCS verbal subscore is 5. GCS motor subscore is 6.     Cranial Nerves: Cranial nerves 2-12 are intact.     Sensory: Sensation is intact.     Motor: No weakness, tremor, seizure activity or pronator drift.     Coordination: Finger-Nose-Finger Test normal.     Comments: Sensation to light touch grossly intact to bilateral upper and lower  extremities.  Grip strength equal.  +5 strength to bilateral upper upper extremities.  Patient able to lift both lower extremities and hold against gravity without difficulty.  Psychiatric:        Behavior: Behavior is cooperative.    ED Results / Procedures / Treatments   Labs (all labs ordered are listed, but only abnormal results are displayed) Labs Reviewed  COMPREHENSIVE METABOLIC PANEL - Abnormal; Notable for the following components:      Result Value   Glucose, Bld 136 (*)    Creatinine, Ser 1.22 (*)    GFR, Estimated 43 (*)    All other components within normal limits  CBC - Abnormal; Notable for the following components:   WBC 12.1 (*)    All other components within normal limits  URINALYSIS, ROUTINE W REFLEX MICROSCOPIC - Abnormal; Notable for the following components:   Specific Gravity, Urine 1.031 (*)    Ketones, ur 15 (*)    Protein, ur 30 (*)    Leukocytes,Ua SMALL (*)    Bacteria, UA RARE (*)    All other components within normal limits  CBG MONITORING, ED - Abnormal; Notable for the following components:   Glucose-Capillary 102 (*)    All other components within normal limits  RESP PANEL BY RT-PCR (FLU A&B, COVID) ARPGX2  URINE CULTURE  LIPASE, BLOOD  TROPONIN I (HIGH SENSITIVITY)  TROPONIN I (HIGH SENSITIVITY)    EKG EKG Interpretation  Date/Time:  Friday June 12 2021 14:56:09 EST Ventricular Rate:  58 PR Interval:  168 QRS Duration: 80 QT Interval:  452 QTC Calculation: 444 R Axis:   86 Text Interpretation: Sinus rhythm Borderline right axis deviation Nonspecific T abnrm, anterolateral leads Confirmed by Fredia Sorrow [phone]) on 06/12/2021 3:44:07 PM  Radiology DG Shoulder Right  Result Date: 06/12/2021 CLINICAL DATA:  Pain EXAM: RIGHT SHOULDER - 2+ VIEW COMPARISON:  None. FINDINGS: There is no evidence of fracture or dislocation. Mild arthropathy of the acromioclavicular and glenohumeral joints. Soft tissues are unremarkable. IMPRESSION: No  acute osseous abnormality. Mild arthropathy of the right shoulder. Electronically Signed   By: Davina Poke D.O.   On: 06/12/2021 16:25   CT ABDOMEN PELVIS W CONTRAST  Result Date: 06/12/2021 CLINICAL DATA:  Acute nonlocalized abdominal pain. Started yesterday. Patient has been losing weight with little appetite over the past 4 weeks. EXAM:  CT ABDOMEN AND PELVIS WITH CONTRAST TECHNIQUE: Multidetector CT imaging of the abdomen and pelvis was performed using the standard protocol following bolus administration of intravenous contrast. RADIATION DOSE REDUCTION: This exam was performed according to the departmental dose-optimization program which includes automated exposure control, adjustment of the mA and/or kV according to patient size and/or use of iterative reconstruction technique. CONTRAST:  25mL OMNIPAQUE IOHEXOL 300 MG/ML  SOLN COMPARISON:  CT abdomen and pelvis 02/27/2021 FINDINGS: Lower chest: Lung bases are unremarkable. Partially visualized heart, again mildly enlarged. Hepatobiliary: Smooth liver contours. No focal liver mass is identified. The gallbladder is unremarkable. No intrahepatic or extrahepatic biliary ductal dilatation. Pancreas: No mass or inflammatory fat stranding. No pancreatic ductal dilatation is seen. Spleen: Normal in size without focal abnormality. Adrenals/Urinary Tract: Adrenal glands are unremarkable. The previously seen right lower pole hypoenhancement has resolved, now there is homogeneous enhancement of the bilateral kidneys. The previously seen right urothelial enhancement has also resolved. Interval decrease and near resolution of the prior enhancement and thickening of the urinary bladder wall, now with possible minimal right-sided thickening however still decreased compared to 02/27/2021 prior (coronal image 37). Findings suggest resolution of the prior pyelonephritis and cystitis. No renal stone or hydronephrosis is seen. Stomach/Bowel: Mild sigmoid diverticulosis.  Oral contrast is seen as distal as the proximal transverse colon. The terminal ileum is unremarkable. The appendix is not confidently identified, however no inflammatory changes are seen around the cecum to indicate secondary signs of acute appendicitis. Small hiatal hernia, similar to prior. No dilated loops of bowel to indicate bowel obstruction. Vascular/Lymphatic: No abdominal aortic aneurysm. Reproductive: The uterus is present and unremarkable. No adnexal mass is seen. Other: No abdominal wall hernia or abnormality.No abdominopelvic ascites. No pneumoperitoneum. Musculoskeletal: Minimal levocurvature of the mid to upper lumbar spine. Moderate to severe multilevel degenerative disc changes. IMPRESSION:: IMPRESSION: 1. Resolution of the prior right pyelonephritis and right urothelial enhancement compared to prior 02/27/2021 CT. 2. Mild sigmoid diverticulosis without evidence of acute diverticulitis. 3. No CT explanation for the patient's abdominal pain. Electronically Signed   By: Yvonne Kendall M.D.   On: 06/12/2021 17:57   DG Chest Port 1 View  Result Date: 06/12/2021 CLINICAL DATA:  Chest pain for 2 weeks EXAM: PORTABLE CHEST 1 VIEW COMPARISON:  02/27/2021 FINDINGS: Heart size within normal limits. Aortic atherosclerosis. Hyperinflated lungs with chronically coarsened interstitial markings bilaterally. No new airspace consolidation. No pleural effusion or pneumothorax. IMPRESSION: No active disease. Electronically Signed   By: Davina Poke D.O.   On: 06/12/2021 15:41    Procedures Procedures    Medications Ordered in ED Medications  lactated ringers bolus 500 mL (0 mLs Intravenous Stopped 06/12/21 1827)  ondansetron (ZOFRAN) injection 4 mg (4 mg Intravenous Given 06/12/21 1637)  iohexol (OMNIPAQUE) 300 MG/ML solution 100 mL (60 mLs Intravenous Contrast Given 06/12/21 1731)  alum & mag hydroxide-simeth (MAALOX/MYLANTA) 200-200-20 MG/5ML suspension 30 mL (30 mLs Oral Given 06/12/21 1844)    And   lidocaine (XYLOCAINE) 2 % viscous mouth solution 15 mL (15 mLs Oral Given 06/12/21 1844)    ED Course/ Medical Decision Making/ A&P                           Medical Decision Making Amount and/or Complexity of Data Reviewed Labs: ordered. Radiology: ordered.  Risk OTC drugs. Prescription drug management.   Alert 88 year old female no acute stress, nontoxic-appearing.  Presents the emergency department with a chief complaint of nausea,  abdominal pain, and chest pain.  See HPI for medical history which complicates patient's care.  Information was obtained from patient and patient's daughter at bedside.  Medical records were reviewed including previous provider notes, labs, and imaging.  Patient has had nausea, intermittent abdominal pain, and decreased appetite over the last 4 weeks.  Abdominal pain has been worse over the last 2 days.  Chest pain has been constant over the last week.  Pain became worse yesterday and today.  Due to patient's chest pain ACS work-up initiated.  The patient's abdominal pain will obtain lipase, CMP, CBC, urinalysis, urine culture.    PE was considered however low suspicion at this time as patient has no hemoptysis, tachycardia, or decreased SPO2.  Lab results were independently reviewed myself.  Pertinent findings include: -Leukocytosis 12.1 -Creatinine at 1.22 -Troponin 5 and 5 with delta of 0 -Urinalysis shows bacteria, WBC 6-10, RBC 0-5, Small, nitrite negative -Respiratory panel negative for COVID-19 and influenza  Chest x-ray was independently reviewed myself, imaging shows no acute cardiopulmonary abnormality  Due to patient's right shoulder pain x-ray imaging was obtained and independently reviewed myself.  Imaging shows no acute osseous abnormality  CT abdomen pelvis shows no acute abnormality within abdomen or pelvis.  Patient was given GI cocktail and Zofran with improvement in her abdominal pain.  On serial reexamination abdomen soft,  nondistended, nontender.  Patient has prescription for antiemetic.  Will prescribe patient with Mylanta.  Patient advised to follow-up with gastroenterologist, cardiologist and PCP.  Will discharge patient at this time.    Patient care was discussed with attending patient Dr. Virl Cagey.  Discussed results, findings, treatment and follow up. Patient and patient's daughter advised of return precautions. Patient and patient's daughter verbalized understanding and agreed with plan.         Final Clinical Impression(s) / ED Diagnoses Final diagnoses:  None    Rx / DC Orders ED Discharge Orders     None         Loni Beckwith, PA-C 06/13/21 0017    Fredia Sorrow, MD 06/14/21 [phone]

## 2021-06-12 NOTE — ED Notes (Addendum)
Pt's CBG was 102 at 1540. Glucometer did not transfer over.

## 2021-06-12 NOTE — ED Triage Notes (Addendum)
Pt came in C/O Chest Pain that started about two weeks ago that is medial and radiates down right arm. She is also c/o abd pain that started yesterday. Pt has been losing weight with little appetite the past 4 weeks. Pt was at PCP and recommended to be sent here for evaluation.

## 2021-06-12 NOTE — ED Notes (Signed)
Oral contrast given to pt to drink; scan pt at 5:40pm

## 2021-06-13 LAB — URINE CULTURE: Culture: <10000 — AB

## 2021-06-16 DIAGNOSIS — R1013 Epigastric pain: Secondary | ICD-10-CM | POA: Diagnosis not present

## 2021-06-16 DIAGNOSIS — R197 Diarrhea, unspecified: Secondary | ICD-10-CM | POA: Diagnosis not present

## 2021-06-16 DIAGNOSIS — R110 Nausea: Secondary | ICD-10-CM | POA: Diagnosis not present

## 2021-06-16 DIAGNOSIS — R634 Abnormal weight loss: Secondary | ICD-10-CM | POA: Diagnosis not present

## 2021-06-17 ENCOUNTER — Other Ambulatory Visit (HOSPITAL_COMMUNITY): Payer: Self-pay | Admitting: Gastroenterology

## 2021-06-17 DIAGNOSIS — R1011 Right upper quadrant pain: Secondary | ICD-10-CM

## 2021-06-18 ENCOUNTER — Other Ambulatory Visit: Payer: Self-pay

## 2021-06-18 ENCOUNTER — Ambulatory Visit (HOSPITAL_COMMUNITY)
Admission: RE | Admit: 2021-06-18 | Discharge: 2021-06-18 | Disposition: A | Discharge status: Home / Self Care | Payer: Medicare Other | Source: Ambulatory Visit | Attending: Gastroenterology | Admitting: Gastroenterology

## 2021-06-18 DIAGNOSIS — R1011 Right upper quadrant pain: Secondary | ICD-10-CM | POA: Insufficient documentation

## 2021-06-18 DIAGNOSIS — K8020 Calculus of gallbladder without cholecystitis without obstruction: Secondary | ICD-10-CM | POA: Diagnosis not present

## 2021-06-22 DIAGNOSIS — E1165 Type 2 diabetes mellitus with hyperglycemia: Secondary | ICD-10-CM | POA: Diagnosis not present

## 2021-06-24 ENCOUNTER — Ambulatory Visit (HOSPITAL_COMMUNITY): Payer: Medicare Other

## 2021-07-01 DIAGNOSIS — K8020 Calculus of gallbladder without cholecystitis without obstruction: Secondary | ICD-10-CM | POA: Diagnosis not present

## 2021-07-01 DIAGNOSIS — E43 Unspecified severe protein-calorie malnutrition: Secondary | ICD-10-CM | POA: Diagnosis not present

## 2021-07-06 DIAGNOSIS — Z85818 Personal history of malignant neoplasm of other sites of lip, oral cavity, and pharynx: Secondary | ICD-10-CM | POA: Diagnosis not present

## 2021-07-06 DIAGNOSIS — C069 Malignant neoplasm of mouth, unspecified: Secondary | ICD-10-CM | POA: Diagnosis not present

## 2021-07-06 DIAGNOSIS — Z08 Encounter for follow-up examination after completed treatment for malignant neoplasm: Secondary | ICD-10-CM | POA: Diagnosis not present

## 2021-07-23 DIAGNOSIS — E1165 Type 2 diabetes mellitus with hyperglycemia: Secondary | ICD-10-CM | POA: Diagnosis not present

## 2021-07-23 DIAGNOSIS — L237 Allergic contact dermatitis due to plants, except food: Secondary | ICD-10-CM | POA: Diagnosis not present

## 2021-07-28 DIAGNOSIS — H905 Unspecified sensorineural hearing loss: Secondary | ICD-10-CM | POA: Diagnosis not present

## 2021-08-24 DIAGNOSIS — E1165 Type 2 diabetes mellitus with hyperglycemia: Secondary | ICD-10-CM | POA: Diagnosis not present

## 2021-09-01 DIAGNOSIS — E43 Unspecified severe protein-calorie malnutrition: Secondary | ICD-10-CM | POA: Diagnosis not present

## 2021-09-01 DIAGNOSIS — Z9181 History of falling: Secondary | ICD-10-CM | POA: Diagnosis not present

## 2021-09-01 DIAGNOSIS — R54 Age-related physical debility: Secondary | ICD-10-CM | POA: Diagnosis not present

## 2021-09-01 DIAGNOSIS — L309 Dermatitis, unspecified: Secondary | ICD-10-CM | POA: Diagnosis not present

## 2021-09-02 DIAGNOSIS — Z9181 History of falling: Secondary | ICD-10-CM | POA: Diagnosis not present

## 2021-09-24 DIAGNOSIS — E1165 Type 2 diabetes mellitus with hyperglycemia: Secondary | ICD-10-CM | POA: Diagnosis not present

## 2021-10-19 DIAGNOSIS — Z961 Presence of intraocular lens: Secondary | ICD-10-CM | POA: Diagnosis not present

## 2021-10-19 DIAGNOSIS — H40023 Open angle with borderline findings, high risk, bilateral: Secondary | ICD-10-CM | POA: Diagnosis not present

## 2021-10-19 DIAGNOSIS — E119 Type 2 diabetes mellitus without complications: Secondary | ICD-10-CM | POA: Diagnosis not present

## 2021-10-25 DIAGNOSIS — E1165 Type 2 diabetes mellitus with hyperglycemia: Secondary | ICD-10-CM | POA: Diagnosis not present

## 2021-10-29 ENCOUNTER — Other Ambulatory Visit: Payer: Self-pay | Admitting: Cardiology

## 2021-10-29 DIAGNOSIS — J4521 Mild intermittent asthma with (acute) exacerbation: Secondary | ICD-10-CM

## 2021-11-16 DIAGNOSIS — I5189 Other ill-defined heart diseases: Secondary | ICD-10-CM | POA: Diagnosis not present

## 2021-11-16 DIAGNOSIS — E785 Hyperlipidemia, unspecified: Secondary | ICD-10-CM | POA: Diagnosis not present

## 2021-11-16 DIAGNOSIS — E1169 Type 2 diabetes mellitus with other specified complication: Secondary | ICD-10-CM | POA: Diagnosis not present

## 2021-11-16 DIAGNOSIS — R21 Rash and other nonspecific skin eruption: Secondary | ICD-10-CM | POA: Diagnosis not present

## 2021-11-16 DIAGNOSIS — E039 Hypothyroidism, unspecified: Secondary | ICD-10-CM | POA: Diagnosis not present

## 2021-11-16 DIAGNOSIS — Z0000 Encounter for general adult medical examination without abnormal findings: Secondary | ICD-10-CM | POA: Diagnosis not present

## 2021-11-16 DIAGNOSIS — J449 Chronic obstructive pulmonary disease, unspecified: Secondary | ICD-10-CM | POA: Diagnosis not present

## 2021-11-16 DIAGNOSIS — I10 Essential (primary) hypertension: Secondary | ICD-10-CM | POA: Diagnosis not present

## 2021-11-16 DIAGNOSIS — I700 Atherosclerosis of aorta: Secondary | ICD-10-CM | POA: Diagnosis not present

## 2021-11-19 DIAGNOSIS — H5213 Myopia, bilateral: Secondary | ICD-10-CM | POA: Diagnosis not present

## 2021-11-25 DIAGNOSIS — E1165 Type 2 diabetes mellitus with hyperglycemia: Secondary | ICD-10-CM | POA: Diagnosis not present

## 2021-12-07 ENCOUNTER — Ambulatory Visit: Payer: Medicare Other | Admitting: Internal Medicine

## 2021-12-18 DIAGNOSIS — H524 Presbyopia: Secondary | ICD-10-CM | POA: Diagnosis not present

## 2021-12-26 DIAGNOSIS — E1165 Type 2 diabetes mellitus with hyperglycemia: Secondary | ICD-10-CM | POA: Diagnosis not present

## 2022-01-26 DIAGNOSIS — E1165 Type 2 diabetes mellitus with hyperglycemia: Secondary | ICD-10-CM | POA: Diagnosis not present

## 2022-01-29 DIAGNOSIS — L308 Other specified dermatitis: Secondary | ICD-10-CM | POA: Diagnosis not present

## 2022-03-25 DIAGNOSIS — E1165 Type 2 diabetes mellitus with hyperglycemia: Secondary | ICD-10-CM | POA: Diagnosis not present

## 2022-04-16 ENCOUNTER — Ambulatory Visit: Payer: Medicare Other | Admitting: Internal Medicine

## 2022-06-24 DIAGNOSIS — E1165 Type 2 diabetes mellitus with hyperglycemia: Secondary | ICD-10-CM | POA: Diagnosis not present

## 2022-07-05 DIAGNOSIS — J449 Chronic obstructive pulmonary disease, unspecified: Secondary | ICD-10-CM | POA: Diagnosis not present

## 2022-07-05 DIAGNOSIS — E785 Hyperlipidemia, unspecified: Secondary | ICD-10-CM | POA: Diagnosis not present

## 2022-07-05 DIAGNOSIS — E1122 Type 2 diabetes mellitus with diabetic chronic kidney disease: Secondary | ICD-10-CM | POA: Diagnosis not present

## 2022-07-05 DIAGNOSIS — I10 Essential (primary) hypertension: Secondary | ICD-10-CM | POA: Diagnosis not present

## 2022-07-05 DIAGNOSIS — E039 Hypothyroidism, unspecified: Secondary | ICD-10-CM | POA: Diagnosis not present

## 2022-07-05 DIAGNOSIS — E1169 Type 2 diabetes mellitus with other specified complication: Secondary | ICD-10-CM | POA: Diagnosis not present

## 2022-07-16 DIAGNOSIS — N1831 Chronic kidney disease, stage 3a: Secondary | ICD-10-CM | POA: Diagnosis not present

## 2022-07-16 DIAGNOSIS — E1122 Type 2 diabetes mellitus with diabetic chronic kidney disease: Secondary | ICD-10-CM | POA: Diagnosis not present

## 2022-07-16 DIAGNOSIS — E1165 Type 2 diabetes mellitus with hyperglycemia: Secondary | ICD-10-CM | POA: Diagnosis not present

## 2022-07-16 DIAGNOSIS — I10 Essential (primary) hypertension: Secondary | ICD-10-CM | POA: Diagnosis not present

## 2022-07-16 DIAGNOSIS — I700 Atherosclerosis of aorta: Secondary | ICD-10-CM | POA: Diagnosis not present

## 2022-07-16 DIAGNOSIS — E43 Unspecified severe protein-calorie malnutrition: Secondary | ICD-10-CM | POA: Diagnosis not present

## 2022-07-16 DIAGNOSIS — E785 Hyperlipidemia, unspecified: Secondary | ICD-10-CM | POA: Diagnosis not present

## 2022-07-19 DIAGNOSIS — C069 Malignant neoplasm of mouth, unspecified: Secondary | ICD-10-CM | POA: Diagnosis not present

## 2022-09-22 ENCOUNTER — Encounter (HOSPITAL_BASED_OUTPATIENT_CLINIC_OR_DEPARTMENT_OTHER): Payer: Self-pay

## 2022-09-22 ENCOUNTER — Emergency Department (HOSPITAL_BASED_OUTPATIENT_CLINIC_OR_DEPARTMENT_OTHER): Payer: 59

## 2022-09-22 ENCOUNTER — Emergency Department (HOSPITAL_BASED_OUTPATIENT_CLINIC_OR_DEPARTMENT_OTHER)
Admission: EM | Admit: 2022-09-22 | Discharge: 2022-09-23 | Disposition: A | Discharge status: Home / Self Care | Payer: 59 | Attending: Emergency Medicine | Admitting: Emergency Medicine

## 2022-09-22 ENCOUNTER — Other Ambulatory Visit: Payer: Self-pay

## 2022-09-22 DIAGNOSIS — R109 Unspecified abdominal pain: Secondary | ICD-10-CM

## 2022-09-22 DIAGNOSIS — K5730 Diverticulosis of large intestine without perforation or abscess without bleeding: Secondary | ICD-10-CM | POA: Diagnosis not present

## 2022-09-22 DIAGNOSIS — Z79899 Other long term (current) drug therapy: Secondary | ICD-10-CM | POA: Diagnosis not present

## 2022-09-22 DIAGNOSIS — K8020 Calculus of gallbladder without cholecystitis without obstruction: Secondary | ICD-10-CM | POA: Diagnosis not present

## 2022-09-22 DIAGNOSIS — E039 Hypothyroidism, unspecified: Secondary | ICD-10-CM | POA: Diagnosis not present

## 2022-09-22 DIAGNOSIS — E119 Type 2 diabetes mellitus without complications: Secondary | ICD-10-CM | POA: Diagnosis not present

## 2022-09-22 DIAGNOSIS — I10 Essential (primary) hypertension: Secondary | ICD-10-CM | POA: Insufficient documentation

## 2022-09-22 DIAGNOSIS — M545 Low back pain, unspecified: Secondary | ICD-10-CM | POA: Diagnosis not present

## 2022-09-22 DIAGNOSIS — R1031 Right lower quadrant pain: Secondary | ICD-10-CM | POA: Diagnosis not present

## 2022-09-22 DIAGNOSIS — R112 Nausea with vomiting, unspecified: Secondary | ICD-10-CM | POA: Diagnosis not present

## 2022-09-22 LAB — TROPONIN I (HIGH SENSITIVITY): Troponin I (High Sensitivity): 5 ng/L (ref <18)

## 2022-09-22 LAB — I-STAT VENOUS BLOOD GAS, ED
Acid-Base Excess: 0 mmol/L (ref 0.0–2.0)
Bicarbonate: 25.4 mmol/L (ref 20.0–28.0)
Calcium, Ion: 1.29 mmol/L (ref 1.15–1.40)
HCT: 36 % (ref 36.0–46.0)
Hemoglobin: 12.2 g/dL (ref 12.0–15.0)
O2 Saturation: 40 %
Patient temperature: 97.8
Potassium: 3.5 mmol/L (ref 3.5–5.1)
Sodium: 139 mmol/L (ref 135–145)
TCO2: 27 mmol/L (ref 22–32)
pCO2, Ven: 44.5 mmHg (ref 44–60)
pH, Ven: 7.363 (ref 7.25–7.43)
pO2, Ven: 23 mmHg — CL (ref 32–45)

## 2022-09-22 LAB — CBC
HCT: 42.4 % (ref 36.0–46.0)
Hemoglobin: 13.9 g/dL (ref 12.0–15.0)
MCH: 28.5 pg (ref 26.0–34.0)
MCHC: 32.8 g/dL (ref 30.0–36.0)
MCV: 86.9 fL (ref 80.0–100.0)
Platelets: 234 10*3/uL (ref 150–400)
RBC: 4.88 MIL/uL (ref 3.87–5.11)
RDW: 13.2 % (ref 11.5–15.5)
WBC: 11.3 10*3/uL — ABNORMAL HIGH (ref 4.0–10.5)
nRBC: 0 % (ref 0.0–0.2)

## 2022-09-22 LAB — COMPREHENSIVE METABOLIC PANEL
ALT: 8 U/L (ref 0–44)
AST: 26 U/L (ref 15–41)
Albumin: 4.5 g/dL (ref 3.5–5.0)
Alkaline Phosphatase: 34 U/L — ABNORMAL LOW (ref 38–126)
Anion gap: 14 (ref 5–15)
BUN: 12 mg/dL (ref 8–23)
CO2: 22 mmol/L (ref 22–32)
Calcium: 10.4 mg/dL — ABNORMAL HIGH (ref 8.9–10.3)
Chloride: 105 mmol/L (ref 98–111)
Creatinine, Ser: 1.04 mg/dL — ABNORMAL HIGH (ref 0.44–1.00)
GFR, Estimated: 52 mL/min — ABNORMAL LOW (ref >60)
Glucose, Bld: 168 mg/dL — ABNORMAL HIGH (ref 70–99)
Potassium: 4.1 mmol/L (ref 3.5–5.1)
Sodium: 141 mmol/L (ref 135–145)
Total Bilirubin: 0.6 mg/dL (ref 0.3–1.2)
Total Protein: 7.6 g/dL (ref 6.5–8.1)

## 2022-09-22 LAB — LIPASE, BLOOD: Lipase: 14 U/L (ref 11–51)

## 2022-09-22 MED ORDER — ONDANSETRON HCL 4 MG/2ML IJ SOLN
4.0000 mg | Freq: Once | INTRAMUSCULAR | Status: AC
  Administered 2022-09-22: 4 mg via INTRAVENOUS
  Filled 2022-09-22: qty 2

## 2022-09-22 MED ORDER — IOHEXOL 300 MG/ML  SOLN
100.0000 mL | Freq: Once | INTRAMUSCULAR | Status: AC | PRN
  Administered 2022-09-22: 60 mL via INTRAVENOUS

## 2022-09-22 MED ORDER — LACTATED RINGERS IV BOLUS
1000.0000 mL | Freq: Once | INTRAVENOUS | Status: AC
  Administered 2022-09-22: 1000 mL via INTRAVENOUS

## 2022-09-22 MED ORDER — FENTANYL CITRATE PF 50 MCG/ML IJ SOSY
50.0000 ug | PREFILLED_SYRINGE | Freq: Once | INTRAMUSCULAR | Status: AC
  Administered 2022-09-22: 50 ug via INTRAVENOUS
  Filled 2022-09-22: qty 1

## 2022-09-22 NOTE — ED Provider Notes (Signed)
[place] EMERGENCY DEPARTMENT AT [facility] Provider Note   CSN: [id number] Arrival date & time: 09/22/22  1857     History  Chief Complaint  Patient presents with   Emesis    Hannah [name] is a 89 y.o. female.  HPI     89 year old female with a history of type 2 diabetes, oral cancer, hypertension, hypothyroidism, who presents with concern for right-sided abdominal and back pain, nausea and vomiting.  2 days ago began to have lower right back/pelvis pain, and yesterday was having nausea and vomiting all day.  Reports many episodes of vomiting yesterday, today vomited 3 times.  Denies diarrhea, constipation, black or bloody stools.  Had a normal bowel movement yesterday.  Reports that she has not passed flatus today.  No known fevers or chills. No dysuria. No numbness/weakness/bowel bladder incontinence.  Has had history of sciatica on left side.    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   Spinal stenosis    Tuberculosis    1978   Weight loss, unintentional     Home Medications Prior to Admission medications   Medication Sig Start Date End Date Taking? Authorizing Provider  acetaminophen (TYLENOL) 325 MG tablet Take 650 mg by mouth every 6 (six) hours as needed for mild pain.     [provider]  albuterol (VENTOLIN HFA) 108 (90 Base) MCG/ACT inhaler Inhale 2 puffs into the lungs every 6 (six) hours as needed for wheezing or shortness of breath. 02/20/19   Icard, Rachel Bo, DO  alum & mag hydroxide-simeth (MYLANTA MAXIMUM STRENGTH) 400-400-40 MG/5ML suspension Take 15 mLs by mouth every 6 (six) hours as needed for indigestion. 06/12/21   Haskel Schroeder, PA-C  augmented betamethasone dipropionate (DIPROLENE-AF) 0.05 % cream Apply 1 application topically daily as needed (rash).  07/21/18   [provider]   Cholecalciferol (VITAMIN D-3) 125 MCG (5000 UT) TABS Take 1,000 mg by mouth daily.    [provider]  cyanocobalamin 1000 MCG tablet Take 1,000 mcg by mouth daily.    [provider]  diclofenac sodium (VOLTAREN) 1 % GEL Apply 2 g topically 3 (three) times daily as needed (joint pain).    [provider]  ezetimibe-simvastatin (VYTORIN) 10-20 MG per tablet Take 1 tablet by mouth daily.     [provider]  insulin glargine (LANTUS SOLOSTAR) 100 UNIT/ML Solostar Pen Inject 10 Units into the skin at bedtime.    [provider]  insulin lispro (HUMALOG) 100 UNIT/ML KwikPen Inject 2-10 Units into the skin 3 (three) times daily. Sliding scale    [provider]  latanoprost (XALATAN) 0.005 % ophthalmic solution Place 1 drop into both eyes at bedtime. 02/20/21   [provider]  levothyroxine (SYNTHROID) 50 MCG tablet Take 1 tablet (50 mcg total) by mouth See admin instructions. Take alternating every other day. 03/03/21   Leatha Gilding, MD  lidocaine (XYLOCAINE) 2 % solution Use as directed 3 mLs in the mouth or throat daily as needed for mouth pain. 04/28/18   [provider]  megestrol (MEGACE) 400 MG/10ML suspension Take 400 mg by mouth daily.     [provider]  metoprolol tartrate (LOPRESSOR) 25 MG tablet Take 1 tablet (25 mg total) by mouth 2 (two) times daily. 03/03/21  Leatha Gilding, MD  MYRBETRIQ 25 MG TB24 tablet Take 25 mg by mouth daily. 12/03/20   [provider]  pantoprazole (PROTONIX) 20 MG tablet Take 1 tablet (20 mg total) by mouth daily. 06/12/21 07/12/21  Haskel Schroeder, PA-C  sitaGLIPtin (JANUVIA) 25 MG tablet Take 50 mg by mouth daily.    [provider]  SYMBICORT 160-4.5 MCG/ACT inhaler Inhale 2 puffs by mouth twice daily 10/29/21   Yates Decamp, MD      Allergies    Sulfur, Penicillins, and Sulfa antibiotics    Review of Systems   Review of  Systems  Physical Exam Updated Vital Signs BP 136/73   Pulse 78   Temp 97.8 F (36.6 C) (Oral)   Resp 19   Ht 5\' 2"  (1.575 m)   Wt 42.6 kg   SpO2 96%   BMI 17.19 kg/m  Physical Exam Vitals and nursing note reviewed.  Constitutional:      General: She is not in acute distress.    Appearance: She is well-developed. She is not diaphoretic.  HENT:     Head: Normocephalic and atraumatic.  Eyes:     Conjunctiva/sclera: Conjunctivae normal.  Cardiovascular:     Rate and Rhythm: Normal rate and regular rhythm.     Heart sounds: Normal heart sounds. No murmur heard.    No friction rub. No gallop.  Pulmonary:     Effort: Pulmonary effort is normal. No respiratory distress.     Breath sounds: Normal breath sounds. No wheezing or rales.  Abdominal:     General: There is no distension.     Palpations: Abdomen is soft.     Tenderness: There is abdominal tenderness (RUQ, RLQ, right flank). There is no guarding. Positive signs include Murphy's sign.  Musculoskeletal:        General: Tenderness (right lower back) present.     Cervical back: Normal range of motion.  Skin:    General: Skin is warm and dry.     Findings: No erythema or rash.  Neurological:     Mental Status: She is alert and oriented to person, place, and time.     ED Results / Procedures / Treatments   Labs (all labs ordered are listed, but only abnormal results are displayed) Labs Reviewed  CBC - Abnormal; Notable for the following components:      Result Value   WBC 11.3 (*)    All other components within normal limits  COMPREHENSIVE METABOLIC PANEL - Abnormal; Notable for the following components:   Glucose, Bld 168 (*)    Creatinine, Ser 1.04 (*)    Calcium 10.4 (*)    Alkaline Phosphatase 34 (*)    GFR, Estimated 52 (*)    All other components within normal limits  I-STAT VENOUS BLOOD GAS, ED - Abnormal; Notable for the following components:   pO2, Ven 23 (*)    All other components within normal  limits  LIPASE, BLOOD  URINALYSIS, W/ REFLEX TO CULTURE (INFECTION SUSPECTED)  TROPONIN I (HIGH SENSITIVITY)    EKG EKG Interpretation  Date/Time:  Wednesday Sep 22 2022 19:13:50 EDT Ventricular Rate:  81 PR Interval:  158 QRS Duration: 68 QT Interval:  382 QTC Calculation: 443 R Axis:   86 Text Interpretation: Normal sinus rhythm Low voltage QRS Cannot rule out Anterior infarct , age undetermined Abnormal ECG When compared with ECG of 12-Jun-2021 14:56, No significant change since last tracing Confirmed by Alvira Monday ([id number]) on 09/22/2022 11:54:55 PM  Radiology CT ABDOMEN PELVIS W CONTRAST  Result Date: 09/22/2022 CLINICAL DATA:  RLQ abdominal pain eval for ?cholecystitis/SBO/appendicitis EXAM: CT ABDOMEN AND PELVIS WITH CONTRAST TECHNIQUE: Multidetector CT imaging of the abdomen and pelvis was performed using the standard protocol following bolus administration of intravenous contrast. RADIATION DOSE REDUCTION: This exam was performed according to the departmental dose-optimization program which includes automated exposure control, adjustment of the mA and/or kV according to patient size and/or use of iterative reconstruction technique. CONTRAST:  60mL OMNIPAQUE IOHEXOL 300 MG/ML  SOLN COMPARISON:  Abdominal CT 06/12/2021, ultrasound 06/18/2021 FINDINGS: Lower chest: Clear lung bases. Hepatobiliary: Smooth liver contours. No evidence of focal lesion. The gallbladder is physiologically distended. No pericholecystic inflammation. Gallstones are faintly visualized, series 2, image 27. There is no biliary dilatation. Pancreas: Mild atrophy.  No ductal dilatation or inflammation. Spleen: Normal in size without focal abnormality. Adrenals/Urinary Tract: No adrenal nodule. No hydronephrosis. No perinephric inflammation or renal calculi. Urinary bladder is completely empty and not well assessed. Stomach/Bowel: There is no small bowel obstruction or inflammation. Small hiatal hernia. The stomach is  not abnormally distended. Low lying cecum in the right pelvis. No terminal ileal inflammation. The appendix is not confidently visualized, however there is no evidence of appendicitis. Moderate colonic stool burden. Mild sigmoid colonic diverticulosis. No diverticulitis or colonic inflammation. Vascular/Lymphatic: Mild aortic atherosclerosis. No aortic aneurysm. Patent portal vein. No acute vascular findings. No abdominopelvic adenopathy. Reproductive: Normal for age uterine atrophy. No adnexal mass. Other: No ascites, free air or focal fluid collection. There is no abdominal wall hernia. Musculoskeletal: Bony under mineralization. Lumbar degenerative change. There are no acute or suspicious osseous abnormalities. IMPRESSION: 1. No acute abnormality in the abdomen/pelvis. 2. Cholelithiasis without CT findings of acute cholecystitis. 3. Mild sigmoid colonic diverticulosis without diverticulitis. 4. Small hiatal hernia. Aortic Atherosclerosis (ICD10-I70.0). Electronically Signed   By: Narda Rutherford M.D.   On: 09/22/2022 22:34    Procedures Procedures    Medications Ordered in ED Medications  ondansetron [facility]) injection 4 mg (4 mg Intravenous Given 09/22/22 2110)  fentaNYL (SUBLIMAZE) injection 50 mcg (50 mcg Intravenous Given 09/22/22 2111)  iohexol (OMNIPAQUE) 300 MG/ML solution 100 mL (60 mLs Intravenous Contrast Given 09/22/22 2215)  lactated ringers bolus 1,000 mL (1,000 mLs Intravenous New Bag/Given 09/22/22 2351)    ED Course/ Medical Decision Making/ A&P                              89 year old female with a history of type 2 diabetes, oral cancer, hypertension, hypothyroidism, who presents with concern for right-sided abdominal and back pain, nausea and vomiting.  DDx includes DKA, appendicitis, pancreatitis, cholecystitis, pyelonephritis, nephrolithiasis, diverticulitis, AAA, dissection, SBO, disc herniation.  No red flags of back pain to suggest acute spinal emergency- no  weakness/numbness. Normal pulses, low suspicion for acute arterial thrombus or dissection.    Labs obtained and personally about interpreted by me show normal pH without signs of DKA, mild leukocytosis, similar creatinine to prior, no pancreatitis, no transaminitis, normal troponin of low suspicion for ACS.  CT of the abdomen pelvis completed and personally about interpreted by me and radiology shows cholelithiasis without acute cholecystitis, diverticulosis without diverticulitis, hiatal hernia.  Will order right upper ultrasound for further evaluation of cholelithiasis/rule out cholecystitis, and urinalysis pending to evaluate for pyelonephritis.  Given IV fluids, nausea and pain medication.  Care signed out to Dr. Judd Lien with reevaluation, right upper, ultrasound and urinalysis pending.  Final Clinical Impression(s) / ED Diagnoses Final diagnoses:  Right sided abdominal pain  Nausea and vomiting, unspecified vomiting type  Calculus of gallbladder without cholecystitis without obstruction    Rx / DC Orders ED Discharge Orders     None         Alvira Monday, MD 09/22/22 2355

## 2022-09-22 NOTE — ED Triage Notes (Signed)
Patient here POV from Home with Family.  Endorses N/V, Reflux and Lower Back Pain since Yesterday. No fevers or ABD Pain. No Diarrhea.   NAD Noted during Triage. A&Ox4. CGS 15. BIB Wheelchair.

## 2022-09-22 NOTE — ED Notes (Signed)
Patient transported to CT 

## 2022-09-22 NOTE — ED Notes (Signed)
Pt unable to provide urine sample at this time. Will try again later 

## 2022-09-23 DIAGNOSIS — E1165 Type 2 diabetes mellitus with hyperglycemia: Secondary | ICD-10-CM | POA: Diagnosis not present

## 2022-09-23 DIAGNOSIS — K8020 Calculus of gallbladder without cholecystitis without obstruction: Secondary | ICD-10-CM | POA: Diagnosis not present

## 2022-09-23 LAB — URINALYSIS, ROUTINE W REFLEX MICROSCOPIC
Bilirubin Urine: NEGATIVE
Glucose, UA: NEGATIVE mg/dL
Hgb urine dipstick: NEGATIVE
Ketones, ur: 15 mg/dL — AB
Leukocytes,Ua: NEGATIVE
Nitrite: NEGATIVE
Specific Gravity, Urine: >1.046 — ABNORMAL HIGH (ref 1.005–1.030)
pH: 7 (ref 5.0–8.0)

## 2022-09-23 MED ORDER — ONDANSETRON 8 MG PO TBDP: ORAL_TABLET | ORAL | 0 refills | Status: DC

## 2022-09-23 MED ORDER — TRAMADOL HCL 50 MG PO TABS: 50.0000 mg | ORAL_TABLET | Freq: Four times a day (QID) | ORAL | 0 refills | Status: DC | PRN

## 2022-09-23 MED ORDER — CLONIDINE HCL 0.1 MG PO TABS: 0.1000 mg | ORAL_TABLET | Freq: Once | ORAL | Status: DC

## 2022-09-23 NOTE — ED Provider Notes (Signed)
  Physical Exam  BP (!) 188/73   Pulse 64   Temp 97.8 F (36.6 C) (Oral)   Resp 18   Ht 5\' 2"  (1.575 m)   Wt 42.6 kg   SpO2 99%   BMI 17.19 kg/m   Physical Exam Vitals and nursing note reviewed.  Constitutional:      Appearance: Normal appearance.  HENT:     Head: Normocephalic and atraumatic.  Pulmonary:     Effort: Pulmonary effort is normal.  Abdominal:     General: There is no distension.     Tenderness: There is abdominal tenderness. There is no right CVA tenderness, left CVA tenderness, guarding or rebound.     Comments: At the time of my exam, there is mild tenderness to the right upper and lower quadrant and right flank.  Skin:    General: Skin is warm and dry.  Neurological:     Mental Status: She is alert and oriented to person, place, and time.     Procedures  Procedures  ED Course / MDM    Medical Decision Making Amount and/or Complexity of Data Reviewed Labs: ordered. Radiology: ordered.  Risk Prescription drug management.   Care assumed from Dr. Dalene Seltzer at shift change.  Care signed out to me awaitin results of right upper quadrant ultrasound and urinalysis.  Both of these studies have returned with ultrasound showing cholelithiasis but no cholecystitis and urinalysis showing no evidence for infection.  Patient has been reassessed and seems much more comfortable.  She has received her IV fluids and nausea medication and is now feeling improved.  At this point, I see no indication for admission and feel as though patient can be discharged.  I will prescribe medication for nausea and pain and have her follow-up with primary doctor.     Geoffery Lyons, MD 09/23/22 [phone]

## 2022-09-23 NOTE — ED Notes (Signed)
Ultrasound at the bedside

## 2022-09-23 NOTE — Discharge Instructions (Signed)
Begin taking Zofran as prescribed as needed for nausea and tramadol as prescribed as needed for pain.  Clear liquid diet for the next 12 hours, then slowly advance to normal as tolerated.  Follow-up with your primary doctor in the next few days, and return to the ER if you develop worsening pain, high fevers, or for other new and concerning symptoms.

## 2022-10-01 DIAGNOSIS — M5410 Radiculopathy, site unspecified: Secondary | ICD-10-CM | POA: Diagnosis not present

## 2022-10-01 DIAGNOSIS — Z86008 Personal history of in-situ neoplasm of other site: Secondary | ICD-10-CM | POA: Diagnosis not present

## 2022-10-01 DIAGNOSIS — R54 Age-related physical debility: Secondary | ICD-10-CM | POA: Diagnosis not present

## 2022-10-04 ENCOUNTER — Other Ambulatory Visit (HOSPITAL_COMMUNITY): Payer: Self-pay | Admitting: Family Medicine

## 2022-10-04 ENCOUNTER — Other Ambulatory Visit: Payer: Self-pay | Admitting: Family Medicine

## 2022-10-04 DIAGNOSIS — M5410 Radiculopathy, site unspecified: Secondary | ICD-10-CM

## 2022-10-16 ENCOUNTER — Other Ambulatory Visit (HOSPITAL_COMMUNITY): Payer: Self-pay | Admitting: Family Medicine

## 2022-10-16 ENCOUNTER — Ambulatory Visit (HOSPITAL_COMMUNITY)
Admission: RE | Admit: 2022-10-16 | Discharge: 2022-10-16 | Disposition: A | Discharge status: Home / Self Care | Payer: 59 | Source: Ambulatory Visit | Attending: Family Medicine | Admitting: Family Medicine

## 2022-10-16 DIAGNOSIS — M5410 Radiculopathy, site unspecified: Secondary | ICD-10-CM | POA: Insufficient documentation

## 2022-10-16 DIAGNOSIS — M48061 Spinal stenosis, lumbar region without neurogenic claudication: Secondary | ICD-10-CM | POA: Diagnosis not present

## 2022-10-16 DIAGNOSIS — M5126 Other intervertebral disc displacement, lumbar region: Secondary | ICD-10-CM | POA: Diagnosis not present

## 2022-10-25 DIAGNOSIS — H40023 Open angle with borderline findings, high risk, bilateral: Secondary | ICD-10-CM | POA: Diagnosis not present

## 2022-10-25 DIAGNOSIS — Z961 Presence of intraocular lens: Secondary | ICD-10-CM | POA: Diagnosis not present

## 2022-10-25 DIAGNOSIS — E119 Type 2 diabetes mellitus without complications: Secondary | ICD-10-CM | POA: Diagnosis not present

## 2022-12-10 DIAGNOSIS — Z0000 Encounter for general adult medical examination without abnormal findings: Secondary | ICD-10-CM | POA: Diagnosis not present

## 2022-12-10 DIAGNOSIS — E039 Hypothyroidism, unspecified: Secondary | ICD-10-CM | POA: Diagnosis not present

## 2022-12-10 DIAGNOSIS — E1169 Type 2 diabetes mellitus with other specified complication: Secondary | ICD-10-CM | POA: Diagnosis not present

## 2022-12-10 DIAGNOSIS — M549 Dorsalgia, unspecified: Secondary | ICD-10-CM | POA: Diagnosis not present

## 2022-12-10 DIAGNOSIS — I10 Essential (primary) hypertension: Secondary | ICD-10-CM | POA: Diagnosis not present

## 2022-12-10 DIAGNOSIS — E43 Unspecified severe protein-calorie malnutrition: Secondary | ICD-10-CM | POA: Diagnosis not present

## 2022-12-10 DIAGNOSIS — E1122 Type 2 diabetes mellitus with diabetic chronic kidney disease: Secondary | ICD-10-CM | POA: Diagnosis not present

## 2022-12-10 DIAGNOSIS — J449 Chronic obstructive pulmonary disease, unspecified: Secondary | ICD-10-CM | POA: Diagnosis not present

## 2022-12-10 DIAGNOSIS — E785 Hyperlipidemia, unspecified: Secondary | ICD-10-CM | POA: Diagnosis not present

## 2022-12-10 DIAGNOSIS — M85859 Other specified disorders of bone density and structure, unspecified thigh: Secondary | ICD-10-CM | POA: Diagnosis not present

## 2022-12-10 DIAGNOSIS — N183 Chronic kidney disease, stage 3 unspecified: Secondary | ICD-10-CM | POA: Diagnosis not present

## 2022-12-13 DIAGNOSIS — M5416 Radiculopathy, lumbar region: Secondary | ICD-10-CM | POA: Diagnosis not present

## 2022-12-13 DIAGNOSIS — M961 Postlaminectomy syndrome, not elsewhere classified: Secondary | ICD-10-CM | POA: Diagnosis not present

## 2022-12-20 DIAGNOSIS — Z961 Presence of intraocular lens: Secondary | ICD-10-CM | POA: Diagnosis not present

## 2022-12-20 DIAGNOSIS — H40023 Open angle with borderline findings, high risk, bilateral: Secondary | ICD-10-CM | POA: Diagnosis not present

## 2022-12-20 DIAGNOSIS — E119 Type 2 diabetes mellitus without complications: Secondary | ICD-10-CM | POA: Diagnosis not present

## 2022-12-23 DIAGNOSIS — E1165 Type 2 diabetes mellitus with hyperglycemia: Secondary | ICD-10-CM | POA: Diagnosis not present

## 2022-12-30 DIAGNOSIS — Z681 Body mass index (BMI) 19 or less, adult: Secondary | ICD-10-CM | POA: Diagnosis not present

## 2022-12-30 DIAGNOSIS — M47816 Spondylosis without myelopathy or radiculopathy, lumbar region: Secondary | ICD-10-CM | POA: Diagnosis not present

## 2022-12-30 DIAGNOSIS — M961 Postlaminectomy syndrome, not elsewhere classified: Secondary | ICD-10-CM | POA: Diagnosis not present

## 2023-01-14 DIAGNOSIS — M47816 Spondylosis without myelopathy or radiculopathy, lumbar region: Secondary | ICD-10-CM | POA: Diagnosis not present

## 2023-01-20 DIAGNOSIS — E119 Type 2 diabetes mellitus without complications: Secondary | ICD-10-CM | POA: Diagnosis not present

## 2023-01-20 DIAGNOSIS — Z85818 Personal history of malignant neoplasm of other sites of lip, oral cavity, and pharynx: Secondary | ICD-10-CM | POA: Diagnosis not present

## 2023-01-20 DIAGNOSIS — M8588 Other specified disorders of bone density and structure, other site: Secondary | ICD-10-CM | POA: Diagnosis not present

## 2023-01-28 DIAGNOSIS — M47816 Spondylosis without myelopathy or radiculopathy, lumbar region: Secondary | ICD-10-CM | POA: Diagnosis not present

## 2023-02-12 ENCOUNTER — Emergency Department (HOSPITAL_BASED_OUTPATIENT_CLINIC_OR_DEPARTMENT_OTHER): Payer: 59

## 2023-02-12 ENCOUNTER — Emergency Department (HOSPITAL_BASED_OUTPATIENT_CLINIC_OR_DEPARTMENT_OTHER)
Admission: EM | Admit: 2023-02-12 | Discharge: 2023-02-13 | Disposition: A | Discharge status: Home / Self Care | Payer: 59 | Attending: Emergency Medicine | Admitting: Emergency Medicine

## 2023-02-12 ENCOUNTER — Other Ambulatory Visit: Payer: Self-pay

## 2023-02-12 ENCOUNTER — Encounter (HOSPITAL_BASED_OUTPATIENT_CLINIC_OR_DEPARTMENT_OTHER): Payer: Self-pay | Admitting: Emergency Medicine

## 2023-02-12 DIAGNOSIS — D72829 Elevated white blood cell count, unspecified: Secondary | ICD-10-CM | POA: Insufficient documentation

## 2023-02-12 DIAGNOSIS — R112 Nausea with vomiting, unspecified: Secondary | ICD-10-CM | POA: Insufficient documentation

## 2023-02-12 DIAGNOSIS — E119 Type 2 diabetes mellitus without complications: Secondary | ICD-10-CM | POA: Diagnosis not present

## 2023-02-12 DIAGNOSIS — E860 Dehydration: Secondary | ICD-10-CM | POA: Diagnosis not present

## 2023-02-12 DIAGNOSIS — Z794 Long term (current) use of insulin: Secondary | ICD-10-CM | POA: Insufficient documentation

## 2023-02-12 DIAGNOSIS — Z79899 Other long term (current) drug therapy: Secondary | ICD-10-CM | POA: Diagnosis not present

## 2023-02-12 DIAGNOSIS — R197 Diarrhea, unspecified: Secondary | ICD-10-CM | POA: Diagnosis not present

## 2023-02-12 DIAGNOSIS — I10 Essential (primary) hypertension: Secondary | ICD-10-CM | POA: Diagnosis not present

## 2023-02-12 DIAGNOSIS — K529 Noninfective gastroenteritis and colitis, unspecified: Secondary | ICD-10-CM

## 2023-02-12 DIAGNOSIS — E039 Hypothyroidism, unspecified: Secondary | ICD-10-CM | POA: Diagnosis not present

## 2023-02-12 DIAGNOSIS — R109 Unspecified abdominal pain: Secondary | ICD-10-CM | POA: Diagnosis not present

## 2023-02-12 DIAGNOSIS — K828 Other specified diseases of gallbladder: Secondary | ICD-10-CM | POA: Diagnosis not present

## 2023-02-12 LAB — COMPREHENSIVE METABOLIC PANEL
ALT: 11 U/L (ref 0–44)
AST: 22 U/L (ref 15–41)
Albumin: 4.2 g/dL (ref 3.5–5.0)
Alkaline Phosphatase: 32 U/L — ABNORMAL LOW (ref 38–126)
Anion gap: 10 (ref 5–15)
BUN: 13 mg/dL (ref 8–23)
CO2: 24 mmol/L (ref 22–32)
Calcium: 9.9 mg/dL (ref 8.9–10.3)
Chloride: 106 mmol/L (ref 98–111)
Creatinine, Ser: 0.98 mg/dL (ref 0.44–1.00)
GFR, Estimated: 56 mL/min — ABNORMAL LOW (ref >60)
Glucose, Bld: 142 mg/dL — ABNORMAL HIGH (ref 70–99)
Potassium: 4.2 mmol/L (ref 3.5–5.1)
Sodium: 140 mmol/L (ref 135–145)
Total Bilirubin: 0.7 mg/dL (ref 0.3–1.2)
Total Protein: 6.9 g/dL (ref 6.5–8.1)

## 2023-02-12 LAB — CBC
HCT: 37.7 % (ref 36.0–46.0)
Hemoglobin: 12.4 g/dL (ref 12.0–15.0)
MCH: 29.1 pg (ref 26.0–34.0)
MCHC: 32.9 g/dL (ref 30.0–36.0)
MCV: 88.5 fL (ref 80.0–100.0)
Platelets: 173 10*3/uL (ref 150–400)
RBC: 4.26 MIL/uL (ref 3.87–5.11)
RDW: 12.7 % (ref 11.5–15.5)
WBC: 11.5 10*3/uL — ABNORMAL HIGH (ref 4.0–10.5)
nRBC: 0 % (ref 0.0–0.2)

## 2023-02-12 LAB — URINALYSIS, MICROSCOPIC (REFLEX): RBC / HPF: NONE SEEN RBC/hpf (ref 0–5)

## 2023-02-12 LAB — URINALYSIS, ROUTINE W REFLEX MICROSCOPIC
Bilirubin Urine: NEGATIVE
Glucose, UA: NEGATIVE mg/dL
Hgb urine dipstick: NEGATIVE
Ketones, ur: 15 mg/dL — AB
Leukocytes,Ua: NEGATIVE
Nitrite: NEGATIVE
Protein, ur: 100 mg/dL — AB
Specific Gravity, Urine: >1.03 — ABNORMAL HIGH (ref 1.005–1.030)
pH: 6 (ref 5.0–8.0)

## 2023-02-12 LAB — LIPASE, BLOOD: Lipase: 15 U/L (ref 11–51)

## 2023-02-12 MED ORDER — SODIUM CHLORIDE 0.9 % IV BOLUS
500.0000 mL | Freq: Once | INTRAVENOUS | Status: AC
  Administered 2023-02-12: 500 mL via INTRAVENOUS

## 2023-02-12 MED ORDER — SODIUM CHLORIDE 0.9 % IV SOLN
INTRAVENOUS | Status: DC
  Administered 2023-02-12: 500 mL via INTRAVENOUS

## 2023-02-12 MED ORDER — ONDANSETRON HCL 4 MG/2ML IJ SOLN
4.0000 mg | Freq: Once | INTRAMUSCULAR | Status: AC
  Administered 2023-02-12: 4 mg via INTRAVENOUS
  Filled 2023-02-12: qty 2

## 2023-02-12 MED ORDER — IOHEXOL 300 MG/ML  SOLN
100.0000 mL | Freq: Once | INTRAMUSCULAR | Status: AC | PRN
  Administered 2023-02-12: 100 mL via INTRAVENOUS

## 2023-02-12 NOTE — ED Provider Notes (Addendum)
[place] EMERGENCY DEPARTMENT AT [facility] Provider Note   CSN: [id number] Arrival date & time: 02/12/23  2041     History  Chief Complaint  Patient presents with   Emesis    Hannah [name] is a 89 y.o. female.  Patient with acute onset of nausea vomiting and diarrhea without any blood in either one of them at 1:00 this afternoon.  But patient has not wanted to eat or drink much for the past couple days.  Patient Spring had CT scan that showed cholelithiasis and diverticulosis.  Also had ultrasound at that time which showed the same.  Past medical history significant diabetes hypertension high cholesterol hypothyroidism tuberculosis.  Patient last seen in the emergency department on Sep 22, 2018 for right side abdominal pain and that when she had the CT scan and the ultrasound done.  Patient has never used tobacco products.       Home Medications Prior to Admission medications   Medication Sig Start Date End Date Taking? Authorizing Provider  acetaminophen (TYLENOL) 325 MG tablet Take 650 mg by mouth every 6 (six) hours as needed for mild pain.     [provider]  albuterol (VENTOLIN HFA) 108 (90 Base) MCG/ACT inhaler Inhale 2 puffs into the lungs every 6 (six) hours as needed for wheezing or shortness of breath. 02/20/19   Icard, Rachel Bo, DO  alum & mag hydroxide-simeth (MYLANTA MAXIMUM STRENGTH) 400-400-40 MG/5ML suspension Take 15 mLs by mouth every 6 (six) hours as needed for indigestion. 06/12/21   Haskel Schroeder, PA-C  augmented betamethasone dipropionate (DIPROLENE-AF) 0.05 % cream Apply 1 application topically daily as needed (rash).  07/21/18   [provider]  Cholecalciferol (VITAMIN D-3) 125 MCG (5000 UT) TABS Take 1,000 mg by mouth daily.    [provider]  cyanocobalamin 1000 MCG tablet Take 1,000 mcg by mouth daily.    [provider]  diclofenac sodium (VOLTAREN) 1 % GEL Apply 2 g topically 3 (three) times daily  as needed (joint pain).    [provider]  ezetimibe-simvastatin (VYTORIN) 10-20 MG per tablet Take 1 tablet by mouth daily.     [provider]  insulin glargine (LANTUS SOLOSTAR) 100 UNIT/ML Solostar Pen Inject 10 Units into the skin at bedtime.    [provider]  insulin lispro (HUMALOG) 100 UNIT/ML KwikPen Inject 2-10 Units into the skin 3 (three) times daily. Sliding scale    [provider]  latanoprost (XALATAN) 0.005 % ophthalmic solution Place 1 drop into both eyes at bedtime. 02/20/21   [provider]  levothyroxine (SYNTHROID) 50 MCG tablet Take 1 tablet (50 mcg total) by mouth See admin instructions. Take alternating every other day. 03/03/21   Leatha Gilding, MD  lidocaine (XYLOCAINE) 2 % solution Use as directed 3 mLs in the mouth or throat daily as needed for mouth pain. 04/28/18   [provider]  megestrol (MEGACE) 400 MG/10ML suspension Take 400 mg by mouth daily.     [provider]  metoprolol tartrate (LOPRESSOR) 25 MG tablet Take 1 tablet (25 mg total) by mouth 2 (two) times daily. 03/03/21   Leatha Gilding, MD  MYRBETRIQ 25 MG TB24 tablet Take 25 mg by mouth daily. 12/03/20   [provider]  ondansetron (ZOFRAN-ODT) 8 MG disintegrating tablet 8mg  ODT q4 hours prn nausea 09/23/22   Geoffery Lyons, MD  pantoprazole (PROTONIX) 20 MG tablet Take 1 tablet (20 mg total) by mouth daily.  06/12/21 07/12/21  Haskel Schroeder, PA-C  sitaGLIPtin (JANUVIA) 25 MG tablet Take 50 mg by mouth daily.    [provider]  SYMBICORT 160-4.5 MCG/ACT inhaler Inhale 2 puffs by mouth twice daily 10/29/21   Yates Decamp, MD  traMADol (ULTRAM) 50 MG tablet Take 1 tablet (50 mg total) by mouth every 6 (six) hours as needed. 09/23/22   Geoffery Lyons, MD      Allergies    Sulfur, Penicillins, and Sulfa antibiotics    Review of Systems   Review of Systems  Constitutional:  Positive for appetite change.   Gastrointestinal:  Positive for diarrhea, nausea and vomiting.    Physical Exam Updated Vital Signs BP (!) 169/68   Pulse 84   Temp 97.8 F (36.6 C)   Resp 18   Wt 38.1 kg   SpO2 98%   BMI 15.36 kg/m  Physical Exam Vitals and nursing note reviewed.  Constitutional:      General: She is not in acute distress.    Appearance: She is well-developed.  HENT:     Head: Normocephalic and atraumatic.     Mouth/Throat:     Mouth: Mucous membranes are dry.     Comments: Mucous membranes dry. Eyes:     Extraocular Movements: Extraocular movements intact.     Conjunctiva/sclera: Conjunctivae normal.     Pupils: Pupils are equal, round, and reactive to light.  Cardiovascular:     Rate and Rhythm: Normal rate and regular rhythm.     Heart sounds: No murmur heard. Pulmonary:     Effort: Pulmonary effort is normal. No respiratory distress.     Breath sounds: Normal breath sounds.  Abdominal:     General: There is no distension.     Palpations: Abdomen is soft.     Tenderness: There is no abdominal tenderness. There is no guarding.  Musculoskeletal:        General: No swelling.     Cervical back: Normal range of motion and neck supple.  Skin:    General: Skin is warm and dry.     Capillary Refill: Capillary refill takes less than 2 seconds.  Neurological:     General: No focal deficit present.     Mental Status: She is alert and oriented to person, place, and time.  Psychiatric:        Mood and Affect: Mood normal.     ED Results / Procedures / Treatments   Labs (all labs ordered are listed, but only abnormal results are displayed) Labs Reviewed  CBC - Abnormal; Notable for the following components:      Result Value   WBC 11.5 (*)    All other components within normal limits  URINALYSIS, ROUTINE W REFLEX MICROSCOPIC - Abnormal; Notable for the following components:   Specific Gravity, Urine >1.030 (*)    Ketones, ur 15 (*)    Protein, ur 100 (*)    All other  components within normal limits  URINALYSIS, MICROSCOPIC (REFLEX) - Abnormal; Notable for the following components:   Bacteria, UA RARE (*)    All other components within normal limits  LIPASE, BLOOD  COMPREHENSIVE METABOLIC PANEL    EKG None  Radiology No results found.  Procedures Procedures    Medications Ordered in ED Medications  ondansetron (ZOFRAN) injection 4 mg (has no administration in time range)  0.9 %  sodium chloride infusion (has no administration in time range)  sodium chloride 0.9 % bolus 500 mL (has no administration  in time range)    ED Course/ Medical Decision Making/ A&P                                 Medical Decision Making Amount and/or Complexity of Data Reviewed Labs: ordered. Radiology: ordered.  Risk Prescription drug management.   Patient appears dehydrated.  No significant abdominal pain.  But we will go to do CT scan abdomen and pelvis because she has a history of diverticulosis as well as history of cholelithiasis.  CBC white count is 11.5.  Platelets are good at 173 hemoglobin 12.4.  Urinalysis very concentrated with specific gravity greater than 1030.  Not consistent with urinary tract infection.  Patient's lipase is normal at 15.  Complete metabolic panel blood sugar 142 GFR 56 consistent with may be a little bit of dehydration.  Liver function test normal including total bili.  Anion gap normal.  Will give antinausea medicine will give IV fluids.  And will get the CT scan.  Patient's complete metabolic panel and lipase are still pending.   Final Clinical Impression(s) / ED Diagnoses Final diagnoses:  Nausea vomiting and diarrhea  Dehydration    Rx / DC Orders ED Discharge Orders     None         Vanetta Mulders, MD 02/12/23 2218    Vanetta Mulders, MD 02/12/23 2245

## 2023-02-12 NOTE — ED Triage Notes (Signed)
Pt bib wheelchair, c/o n/v/d today. Decreased po intake

## 2023-02-13 DIAGNOSIS — R112 Nausea with vomiting, unspecified: Secondary | ICD-10-CM | POA: Diagnosis not present

## 2023-02-13 MED ORDER — ONDANSETRON 8 MG PO TBDP: ORAL_TABLET | ORAL | 0 refills | Status: DC

## 2023-02-13 NOTE — ED Provider Notes (Signed)
  Physical Exam  BP (!) 141/102   Pulse 87   Temp 97.8 F (36.6 C)   Resp 20   Wt 38.1 kg   SpO2 94%   BMI 15.36 kg/m   Physical Exam Vitals and nursing note reviewed.  Constitutional:      General: She is not in acute distress.    Appearance: She is well-developed. She is not diaphoretic.  HENT:     Head: Normocephalic and atraumatic.  Cardiovascular:     Rate and Rhythm: Normal rate and regular rhythm.     Heart sounds: No murmur heard.    No friction rub. No gallop.  Pulmonary:     Effort: Pulmonary effort is normal. No respiratory distress.     Breath sounds: Normal breath sounds. No wheezing.  Abdominal:     General: Bowel sounds are normal. There is no distension.     Palpations: Abdomen is soft.     Tenderness: There is no abdominal tenderness.  Musculoskeletal:        General: Normal range of motion.     Cervical back: Normal range of motion and neck supple.  Skin:    General: Skin is warm and dry.  Neurological:     General: No focal deficit present.     Mental Status: She is alert and oriented to person, place, and time.     Procedures  Procedures  ED Course / MDM    Medical Decision Making Amount and/or Complexity of Data Reviewed Labs: ordered. Radiology: ordered.  Risk Prescription drug management.  Care assumed from Dr. Deretha Emory at shift change.  Patient brought by daughters for evaluation of nausea and vomiting and decreased p.o. intake.  Care was signed out to me awaiting results of the CT scan of the abdomen and pelvis.  This study has returned and shows evidence for colitis.  It sounds as though the etiology is likely viral.  Patient has been hydrated with normal saline and also given Zofran for nausea.  She seems to be feeling better and is tolerating oral intake.  I feel as though patient can safely be discharged with as needed return.        Geoffery Lyons, MD 02/13/23 [phone]

## 2023-02-13 NOTE — Discharge Instructions (Signed)
Begin taking Zofran as prescribed as needed for nausea.  Clear liquid diet for the next 12 hours, then slowly advance to normal as tolerated.  Return to the ER if symptoms significantly worsen or change. 

## 2023-02-13 NOTE — ED Notes (Signed)
 RN reviewed discharge instructions with pt. Pt verbalized understanding and had no further questions. VSS upon discharge.  

## 2023-02-15 ENCOUNTER — Telehealth (HOSPITAL_BASED_OUTPATIENT_CLINIC_OR_DEPARTMENT_OTHER): Payer: Self-pay | Admitting: Emergency Medicine

## 2023-02-15 MED ORDER — ONDANSETRON HCL 4 MG PO TABS: 4.0000 mg | ORAL_TABLET | Freq: Three times a day (TID) | ORAL | 0 refills | Status: DC | PRN

## 2023-02-15 NOTE — Telephone Encounter (Signed)
I was informed by nursing that the patient was seen by ER providers on 9/21 and prescribed Zofran and ODT.  Her pharmacy called and mentioned that her insurance would only cover Zofran tablets.  Her prescription was updated and sent to her pharmacy as requested.

## 2023-02-18 DIAGNOSIS — M961 Postlaminectomy syndrome, not elsewhere classified: Secondary | ICD-10-CM | POA: Diagnosis not present

## 2023-02-18 DIAGNOSIS — M47816 Spondylosis without myelopathy or radiculopathy, lumbar region: Secondary | ICD-10-CM | POA: Diagnosis not present

## 2023-02-18 DIAGNOSIS — Z681 Body mass index (BMI) 19 or less, adult: Secondary | ICD-10-CM | POA: Diagnosis not present

## 2023-02-18 DIAGNOSIS — R197 Diarrhea, unspecified: Secondary | ICD-10-CM | POA: Diagnosis not present

## 2023-02-18 DIAGNOSIS — R1013 Epigastric pain: Secondary | ICD-10-CM | POA: Diagnosis not present

## 2023-02-18 DIAGNOSIS — R1032 Left lower quadrant pain: Secondary | ICD-10-CM | POA: Diagnosis not present

## 2023-04-01 ENCOUNTER — Other Ambulatory Visit: Payer: Self-pay | Admitting: Family Medicine

## 2023-04-01 ENCOUNTER — Ambulatory Visit
Admission: RE | Admit: 2023-04-01 | Discharge: 2023-04-01 | Disposition: A | Discharge status: Home / Self Care | Payer: 59 | Source: Ambulatory Visit | Attending: Family Medicine | Admitting: Family Medicine

## 2023-04-01 DIAGNOSIS — M79605 Pain in left leg: Secondary | ICD-10-CM

## 2023-04-01 DIAGNOSIS — Z23 Encounter for immunization: Secondary | ICD-10-CM | POA: Diagnosis not present

## 2023-04-01 DIAGNOSIS — N183 Chronic kidney disease, stage 3 unspecified: Secondary | ICD-10-CM | POA: Diagnosis not present

## 2023-04-01 DIAGNOSIS — Z794 Long term (current) use of insulin: Secondary | ICD-10-CM | POA: Diagnosis not present

## 2023-04-01 DIAGNOSIS — Z681 Body mass index (BMI) 19 or less, adult: Secondary | ICD-10-CM | POA: Diagnosis not present

## 2023-04-01 DIAGNOSIS — I10 Essential (primary) hypertension: Secondary | ICD-10-CM | POA: Diagnosis not present

## 2023-04-01 DIAGNOSIS — E1122 Type 2 diabetes mellitus with diabetic chronic kidney disease: Secondary | ICD-10-CM | POA: Diagnosis not present

## 2023-04-01 DIAGNOSIS — E43 Unspecified severe protein-calorie malnutrition: Secondary | ICD-10-CM | POA: Diagnosis not present

## 2023-04-18 DIAGNOSIS — E1165 Type 2 diabetes mellitus with hyperglycemia: Secondary | ICD-10-CM | POA: Diagnosis not present

## 2023-05-05 DIAGNOSIS — M47816 Spondylosis without myelopathy or radiculopathy, lumbar region: Secondary | ICD-10-CM | POA: Diagnosis not present

## 2023-05-27 DIAGNOSIS — I10 Essential (primary) hypertension: Secondary | ICD-10-CM | POA: Diagnosis not present

## 2023-05-27 DIAGNOSIS — J449 Chronic obstructive pulmonary disease, unspecified: Secondary | ICD-10-CM | POA: Diagnosis not present

## 2023-05-27 DIAGNOSIS — E039 Hypothyroidism, unspecified: Secondary | ICD-10-CM | POA: Diagnosis not present

## 2023-05-27 DIAGNOSIS — G9589 Other specified diseases of spinal cord: Secondary | ICD-10-CM | POA: Diagnosis not present

## 2023-05-27 DIAGNOSIS — E785 Hyperlipidemia, unspecified: Secondary | ICD-10-CM | POA: Diagnosis not present

## 2023-05-27 DIAGNOSIS — E1122 Type 2 diabetes mellitus with diabetic chronic kidney disease: Secondary | ICD-10-CM | POA: Diagnosis not present

## 2023-05-27 DIAGNOSIS — I5032 Chronic diastolic (congestive) heart failure: Secondary | ICD-10-CM | POA: Diagnosis not present

## 2023-05-27 DIAGNOSIS — M5416 Radiculopathy, lumbar region: Secondary | ICD-10-CM | POA: Diagnosis not present

## 2023-05-27 DIAGNOSIS — K219 Gastro-esophageal reflux disease without esophagitis: Secondary | ICD-10-CM | POA: Diagnosis not present

## 2023-05-27 DIAGNOSIS — E43 Unspecified severe protein-calorie malnutrition: Secondary | ICD-10-CM | POA: Diagnosis not present

## 2023-05-27 DIAGNOSIS — E1169 Type 2 diabetes mellitus with other specified complication: Secondary | ICD-10-CM | POA: Diagnosis not present

## 2023-06-20 DIAGNOSIS — E1165 Type 2 diabetes mellitus with hyperglycemia: Secondary | ICD-10-CM | POA: Diagnosis not present

## 2023-06-24 DIAGNOSIS — H905 Unspecified sensorineural hearing loss: Secondary | ICD-10-CM | POA: Diagnosis not present

## 2023-07-01 DIAGNOSIS — M5416 Radiculopathy, lumbar region: Secondary | ICD-10-CM | POA: Diagnosis not present

## 2023-07-01 DIAGNOSIS — M47816 Spondylosis without myelopathy or radiculopathy, lumbar region: Secondary | ICD-10-CM | POA: Diagnosis not present

## 2023-07-04 DIAGNOSIS — E119 Type 2 diabetes mellitus without complications: Secondary | ICD-10-CM | POA: Diagnosis not present

## 2023-07-04 DIAGNOSIS — Z961 Presence of intraocular lens: Secondary | ICD-10-CM | POA: Diagnosis not present

## 2023-07-04 DIAGNOSIS — H40023 Open angle with borderline findings, high risk, bilateral: Secondary | ICD-10-CM | POA: Diagnosis not present

## 2023-07-21 DIAGNOSIS — M5416 Radiculopathy, lumbar region: Secondary | ICD-10-CM | POA: Diagnosis not present

## 2023-08-15 DIAGNOSIS — R300 Dysuria: Secondary | ICD-10-CM | POA: Diagnosis not present

## 2023-08-15 DIAGNOSIS — E1165 Type 2 diabetes mellitus with hyperglycemia: Secondary | ICD-10-CM | POA: Diagnosis not present

## 2023-08-22 DIAGNOSIS — E1165 Type 2 diabetes mellitus with hyperglycemia: Secondary | ICD-10-CM | POA: Diagnosis not present

## 2023-09-14 DIAGNOSIS — M5416 Radiculopathy, lumbar region: Secondary | ICD-10-CM | POA: Diagnosis not present

## 2023-09-14 DIAGNOSIS — M961 Postlaminectomy syndrome, not elsewhere classified: Secondary | ICD-10-CM | POA: Diagnosis not present

## 2023-10-06 DIAGNOSIS — M961 Postlaminectomy syndrome, not elsewhere classified: Secondary | ICD-10-CM | POA: Diagnosis not present

## 2023-10-06 DIAGNOSIS — M5124 Other intervertebral disc displacement, thoracic region: Secondary | ICD-10-CM | POA: Diagnosis not present

## 2023-10-06 DIAGNOSIS — M5134 Other intervertebral disc degeneration, thoracic region: Secondary | ICD-10-CM | POA: Diagnosis not present

## 2023-10-06 DIAGNOSIS — M47814 Spondylosis without myelopathy or radiculopathy, thoracic region: Secondary | ICD-10-CM | POA: Diagnosis not present

## 2023-10-06 DIAGNOSIS — R200 Anesthesia of skin: Secondary | ICD-10-CM | POA: Diagnosis not present

## 2023-11-14 DIAGNOSIS — N183 Chronic kidney disease, stage 3 unspecified: Secondary | ICD-10-CM | POA: Diagnosis not present

## 2023-11-14 DIAGNOSIS — E1122 Type 2 diabetes mellitus with diabetic chronic kidney disease: Secondary | ICD-10-CM | POA: Diagnosis not present

## 2023-11-14 DIAGNOSIS — I10 Essential (primary) hypertension: Secondary | ICD-10-CM | POA: Diagnosis not present

## 2023-11-14 DIAGNOSIS — R350 Frequency of micturition: Secondary | ICD-10-CM | POA: Diagnosis not present

## 2023-11-21 DIAGNOSIS — E1165 Type 2 diabetes mellitus with hyperglycemia: Secondary | ICD-10-CM | POA: Diagnosis not present

## 2023-11-26 ENCOUNTER — Other Ambulatory Visit: Payer: Self-pay

## 2023-11-26 ENCOUNTER — Inpatient Hospital Stay (HOSPITAL_BASED_OUTPATIENT_CLINIC_OR_DEPARTMENT_OTHER)
Admission: EM | Admit: 2023-11-26 | Discharge: 2023-12-04 | DRG: 872 | Disposition: A | Discharge status: Home Health | Attending: Internal Medicine | Admitting: Internal Medicine

## 2023-11-26 ENCOUNTER — Emergency Department (HOSPITAL_BASED_OUTPATIENT_CLINIC_OR_DEPARTMENT_OTHER)

## 2023-11-26 DIAGNOSIS — I10 Essential (primary) hypertension: Secondary | ICD-10-CM | POA: Diagnosis present

## 2023-11-26 DIAGNOSIS — R748 Abnormal levels of other serum enzymes: Secondary | ICD-10-CM | POA: Diagnosis not present

## 2023-11-26 DIAGNOSIS — N3281 Overactive bladder: Secondary | ICD-10-CM | POA: Diagnosis present

## 2023-11-26 DIAGNOSIS — E1122 Type 2 diabetes mellitus with diabetic chronic kidney disease: Secondary | ICD-10-CM | POA: Diagnosis present

## 2023-11-26 DIAGNOSIS — L950 Livedoid vasculitis: Secondary | ICD-10-CM | POA: Diagnosis present

## 2023-11-26 DIAGNOSIS — Z681 Body mass index (BMI) 19 or less, adult: Secondary | ICD-10-CM | POA: Diagnosis not present

## 2023-11-26 DIAGNOSIS — J9601 Acute respiratory failure with hypoxia: Secondary | ICD-10-CM | POA: Diagnosis not present

## 2023-11-26 DIAGNOSIS — Z83438 Family history of other disorder of lipoprotein metabolism and other lipidemia: Secondary | ICD-10-CM

## 2023-11-26 DIAGNOSIS — I129 Hypertensive chronic kidney disease with stage 1 through stage 4 chronic kidney disease, or unspecified chronic kidney disease: Secondary | ICD-10-CM | POA: Diagnosis present

## 2023-11-26 DIAGNOSIS — D509 Iron deficiency anemia, unspecified: Secondary | ICD-10-CM | POA: Diagnosis present

## 2023-11-26 DIAGNOSIS — N1831 Chronic kidney disease, stage 3a: Secondary | ICD-10-CM | POA: Diagnosis present

## 2023-11-26 DIAGNOSIS — J449 Chronic obstructive pulmonary disease, unspecified: Secondary | ICD-10-CM | POA: Diagnosis not present

## 2023-11-26 DIAGNOSIS — Z794 Long term (current) use of insulin: Secondary | ICD-10-CM | POA: Diagnosis not present

## 2023-11-26 DIAGNOSIS — Z79890 Hormone replacement therapy (postmenopausal): Secondary | ICD-10-CM

## 2023-11-26 DIAGNOSIS — E039 Hypothyroidism, unspecified: Secondary | ICD-10-CM | POA: Diagnosis present

## 2023-11-26 DIAGNOSIS — Z7984 Long term (current) use of oral hypoglycemic drugs: Secondary | ICD-10-CM | POA: Diagnosis not present

## 2023-11-26 DIAGNOSIS — E876 Hypokalemia: Secondary | ICD-10-CM | POA: Diagnosis present

## 2023-11-26 DIAGNOSIS — A419 Sepsis, unspecified organism: Principal | ICD-10-CM | POA: Diagnosis present

## 2023-11-26 DIAGNOSIS — Z79899 Other long term (current) drug therapy: Secondary | ICD-10-CM

## 2023-11-26 DIAGNOSIS — I7090 Unspecified atherosclerosis: Secondary | ICD-10-CM | POA: Diagnosis not present

## 2023-11-26 DIAGNOSIS — Z923 Personal history of irradiation: Secondary | ICD-10-CM

## 2023-11-26 DIAGNOSIS — E7800 Pure hypercholesterolemia, unspecified: Secondary | ICD-10-CM | POA: Diagnosis present

## 2023-11-26 DIAGNOSIS — R636 Underweight: Secondary | ICD-10-CM | POA: Diagnosis present

## 2023-11-26 DIAGNOSIS — R197 Diarrhea, unspecified: Secondary | ICD-10-CM | POA: Diagnosis not present

## 2023-11-26 DIAGNOSIS — J4489 Other specified chronic obstructive pulmonary disease: Secondary | ICD-10-CM

## 2023-11-26 DIAGNOSIS — Z1152 Encounter for screening for COVID-19: Secondary | ICD-10-CM | POA: Diagnosis not present

## 2023-11-26 DIAGNOSIS — R062 Wheezing: Secondary | ICD-10-CM | POA: Diagnosis not present

## 2023-11-26 DIAGNOSIS — R32 Unspecified urinary incontinence: Secondary | ICD-10-CM | POA: Diagnosis present

## 2023-11-26 DIAGNOSIS — Z8249 Family history of ischemic heart disease and other diseases of the circulatory system: Secondary | ICD-10-CM

## 2023-11-26 DIAGNOSIS — E1165 Type 2 diabetes mellitus with hyperglycemia: Secondary | ICD-10-CM | POA: Diagnosis present

## 2023-11-26 DIAGNOSIS — R933 Abnormal findings on diagnostic imaging of other parts of digestive tract: Secondary | ICD-10-CM | POA: Diagnosis not present

## 2023-11-26 DIAGNOSIS — J441 Chronic obstructive pulmonary disease with (acute) exacerbation: Secondary | ICD-10-CM | POA: Diagnosis present

## 2023-11-26 DIAGNOSIS — E119 Type 2 diabetes mellitus without complications: Secondary | ICD-10-CM | POA: Diagnosis not present

## 2023-11-26 DIAGNOSIS — R634 Abnormal weight loss: Secondary | ICD-10-CM | POA: Diagnosis not present

## 2023-11-26 DIAGNOSIS — Z7951 Long term (current) use of inhaled steroids: Secondary | ICD-10-CM

## 2023-11-26 DIAGNOSIS — Z882 Allergy status to sulfonamides status: Secondary | ICD-10-CM

## 2023-11-26 DIAGNOSIS — R918 Other nonspecific abnormal finding of lung field: Secondary | ICD-10-CM | POA: Diagnosis not present

## 2023-11-26 DIAGNOSIS — E872 Acidosis, unspecified: Secondary | ICD-10-CM | POA: Diagnosis present

## 2023-11-26 DIAGNOSIS — R079 Chest pain, unspecified: Secondary | ICD-10-CM | POA: Diagnosis not present

## 2023-11-26 DIAGNOSIS — J4521 Mild intermittent asthma with (acute) exacerbation: Secondary | ICD-10-CM

## 2023-11-26 DIAGNOSIS — K529 Noninfective gastroenteritis and colitis, unspecified: Secondary | ICD-10-CM | POA: Diagnosis present

## 2023-11-26 DIAGNOSIS — A09 Infectious gastroenteritis and colitis, unspecified: Secondary | ICD-10-CM | POA: Diagnosis not present

## 2023-11-26 DIAGNOSIS — Z8261 Family history of arthritis: Secondary | ICD-10-CM

## 2023-11-26 DIAGNOSIS — K5730 Diverticulosis of large intestine without perforation or abscess without bleeding: Secondary | ICD-10-CM | POA: Diagnosis not present

## 2023-11-26 DIAGNOSIS — R109 Unspecified abdominal pain: Secondary | ICD-10-CM | POA: Diagnosis not present

## 2023-11-26 DIAGNOSIS — R6520 Severe sepsis without septic shock: Secondary | ICD-10-CM | POA: Diagnosis not present

## 2023-11-26 DIAGNOSIS — Z833 Family history of diabetes mellitus: Secondary | ICD-10-CM

## 2023-11-26 DIAGNOSIS — Z66 Do not resuscitate: Secondary | ICD-10-CM | POA: Diagnosis present

## 2023-11-26 DIAGNOSIS — I700 Atherosclerosis of aorta: Secondary | ICD-10-CM | POA: Diagnosis not present

## 2023-11-26 DIAGNOSIS — K449 Diaphragmatic hernia without obstruction or gangrene: Secondary | ICD-10-CM | POA: Diagnosis not present

## 2023-11-26 DIAGNOSIS — J45901 Unspecified asthma with (acute) exacerbation: Secondary | ICD-10-CM | POA: Diagnosis present

## 2023-11-26 DIAGNOSIS — Z85819 Personal history of malignant neoplasm of unspecified site of lip, oral cavity, and pharynx: Secondary | ICD-10-CM

## 2023-11-26 DIAGNOSIS — M40209 Unspecified kyphosis, site unspecified: Secondary | ICD-10-CM | POA: Diagnosis not present

## 2023-11-26 DIAGNOSIS — T380X5A Adverse effect of glucocorticoids and synthetic analogues, initial encounter: Secondary | ICD-10-CM | POA: Diagnosis present

## 2023-11-26 DIAGNOSIS — Z880 Allergy status to penicillin: Secondary | ICD-10-CM

## 2023-11-26 DIAGNOSIS — R1033 Periumbilical pain: Secondary | ICD-10-CM | POA: Diagnosis not present

## 2023-11-26 LAB — CBC WITH DIFFERENTIAL/PLATELET
Abs Immature Granulocytes: 0.29 K/uL — ABNORMAL HIGH (ref 0.00–0.07)
Basophils Absolute: 0.1 K/uL (ref 0.0–0.1)
Basophils Relative: 0 %
Eosinophils Absolute: 0.3 K/uL (ref 0.0–0.5)
Eosinophils Relative: 2 %
HCT: 38.3 % (ref 36.0–46.0)
Hemoglobin: 12.3 g/dL (ref 12.0–15.0)
Immature Granulocytes: 2 %
Lymphocytes Relative: 11 %
Lymphs Abs: 1.7 K/uL (ref 0.7–4.0)
MCH: 30.4 pg (ref 26.0–34.0)
MCHC: 32.1 g/dL (ref 30.0–36.0)
MCV: 94.6 fL (ref 80.0–100.0)
Monocytes Absolute: 0.4 K/uL (ref 0.1–1.0)
Monocytes Relative: 3 %
Neutro Abs: 13.1 K/uL — ABNORMAL HIGH (ref 1.7–7.7)
Neutrophils Relative %: 82 %
Platelets: 262 K/uL (ref 150–400)
RBC: 4.05 MIL/uL (ref 3.87–5.11)
RDW: 13.2 % (ref 11.5–15.5)
WBC: 15.8 K/uL — ABNORMAL HIGH (ref 4.0–10.5)
nRBC: 0.1 % (ref 0.0–0.2)

## 2023-11-26 LAB — D-DIMER, QUANTITATIVE: D-Dimer, Quant: 2.14 ug{FEU}/mL — ABNORMAL HIGH (ref 0.00–0.50)

## 2023-11-26 LAB — COMPREHENSIVE METABOLIC PANEL WITH GFR
ALT: 14 U/L (ref 0–44)
AST: 21 U/L (ref 15–41)
Albumin: 4.2 g/dL (ref 3.5–5.0)
Alkaline Phosphatase: 49 U/L (ref 38–126)
Anion gap: 15 (ref 5–15)
BUN: 18 mg/dL (ref 8–23)
CO2: 23 mmol/L (ref 22–32)
Calcium: 10.4 mg/dL — ABNORMAL HIGH (ref 8.9–10.3)
Chloride: 103 mmol/L (ref 98–111)
Creatinine, Ser: 1.14 mg/dL — ABNORMAL HIGH (ref 0.44–1.00)
GFR, Estimated: 46 mL/min — ABNORMAL LOW (ref >60)
Glucose, Bld: 281 mg/dL — ABNORMAL HIGH (ref 70–99)
Potassium: 4.3 mmol/L (ref 3.5–5.1)
Sodium: 141 mmol/L (ref 135–145)
Total Bilirubin: 0.5 mg/dL (ref 0.0–1.2)
Total Protein: 7.5 g/dL (ref 6.5–8.1)

## 2023-11-26 LAB — URINALYSIS, W/ REFLEX TO CULTURE (INFECTION SUSPECTED)
Bilirubin Urine: NEGATIVE
Glucose, UA: 100 mg/dL — AB
Hgb urine dipstick: NEGATIVE
Ketones, ur: NEGATIVE mg/dL
Nitrite: NEGATIVE
Protein, ur: 30 mg/dL — AB
Specific Gravity, Urine: 1.039 — ABNORMAL HIGH (ref 1.005–1.030)
pH: 6 (ref 5.0–8.0)

## 2023-11-26 LAB — PROTIME-INR
INR: 1.1 (ref 0.8–1.2)
Prothrombin Time: 14.4 s (ref 11.4–15.2)

## 2023-11-26 LAB — RESP PANEL BY RT-PCR (RSV, FLU A&B, COVID)  RVPGX2
Influenza A by PCR: NEGATIVE
Influenza B by PCR: NEGATIVE
Resp Syncytial Virus by PCR: NEGATIVE
SARS Coronavirus 2 by RT PCR: NEGATIVE

## 2023-11-26 LAB — TROPONIN T, HIGH SENSITIVITY
Troponin T High Sensitivity: 28 ng/L — ABNORMAL HIGH (ref <19)
Troponin T High Sensitivity: 27 ng/L — ABNORMAL HIGH (ref <19)

## 2023-11-26 LAB — LIPASE, BLOOD: Lipase: 17 U/L (ref 11–51)

## 2023-11-26 LAB — GLUCOSE, CAPILLARY: Glucose-Capillary: 331 mg/dL — ABNORMAL HIGH (ref 70–99)

## 2023-11-26 LAB — LACTIC ACID, PLASMA
Lactic Acid, Venous: 2.6 mmol/L (ref 0.5–1.9)
Lactic Acid, Venous: 3.4 mmol/L (ref 0.5–1.9)

## 2023-11-26 MED ORDER — METRONIDAZOLE 500 MG/100ML IV SOLN
500.0000 mg | Freq: Once | INTRAVENOUS | Status: AC
  Administered 2023-11-26: 500 mg via INTRAVENOUS
  Filled 2023-11-26: qty 100

## 2023-11-26 MED ORDER — LACTATED RINGERS IV SOLN: INTRAVENOUS | Status: DC

## 2023-11-26 MED ORDER — ONDANSETRON HCL 4 MG/2ML IJ SOLN
4.0000 mg | Freq: Once | INTRAMUSCULAR | Status: AC
  Administered 2023-11-26: 4 mg via INTRAVENOUS
  Filled 2023-11-26: qty 2

## 2023-11-26 MED ORDER — ALBUTEROL SULFATE (2.5 MG/3ML) 0.083% IN NEBU
2.5000 mg | INHALATION_SOLUTION | Freq: Once | RESPIRATORY_TRACT | Status: AC
  Administered 2023-11-26: 2.5 mg via RESPIRATORY_TRACT
  Filled 2023-11-26: qty 3

## 2023-11-26 MED ORDER — SODIUM CHLORIDE 0.9 % IV SOLN: 2.0000 g | Freq: Once | INTRAVENOUS | Status: DC

## 2023-11-26 MED ORDER — IPRATROPIUM-ALBUTEROL 0.5-2.5 (3) MG/3ML IN SOLN
3.0000 mL | Freq: Once | RESPIRATORY_TRACT | Status: AC
  Administered 2023-11-26: 3 mL via RESPIRATORY_TRACT
  Filled 2023-11-26: qty 3

## 2023-11-26 MED ORDER — METOPROLOL TARTRATE 25 MG PO TABS
25.0000 mg | ORAL_TABLET | Freq: Two times a day (BID) | ORAL | Status: DC
  Administered 2023-11-26 – 2023-12-04 (×15): 25 mg via ORAL
  Filled 2023-11-26 (×16): qty 1

## 2023-11-26 MED ORDER — VANCOMYCIN HCL IN DEXTROSE 1-5 GM/200ML-% IV SOLN
1000.0000 mg | Freq: Once | INTRAVENOUS | Status: AC
  Administered 2023-11-26: 1000 mg via INTRAVENOUS
  Filled 2023-11-26: qty 200

## 2023-11-26 MED ORDER — MORPHINE SULFATE (PF) 4 MG/ML IV SOLN
4.0000 mg | Freq: Once | INTRAVENOUS | Status: AC
  Administered 2023-11-26: 4 mg via INTRAVENOUS
  Filled 2023-11-26: qty 1

## 2023-11-26 MED ORDER — METHYLPREDNISOLONE SODIUM SUCC 125 MG IJ SOLR
125.0000 mg | Freq: Once | INTRAMUSCULAR | Status: AC
  Administered 2023-11-26: 125 mg via INTRAVENOUS
  Filled 2023-11-26: qty 2

## 2023-11-26 MED ORDER — IOHEXOL 350 MG/ML SOLN
100.0000 mL | Freq: Once | INTRAVENOUS | Status: AC | PRN
  Administered 2023-11-26: 65 mL via INTRAVENOUS

## 2023-11-26 MED ORDER — MAGNESIUM SULFATE 2 GM/50ML IV SOLN
2.0000 g | Freq: Once | INTRAVENOUS | Status: AC
  Administered 2023-11-26: 2 g via INTRAVENOUS
  Filled 2023-11-26: qty 50

## 2023-11-26 MED ORDER — SODIUM CHLORIDE 0.9 % IV SOLN
2.0000 g | Freq: Once | INTRAVENOUS | Status: DC
  Filled 2023-11-26: qty 20

## 2023-11-26 MED ORDER — SODIUM CHLORIDE 0.9 % IV SOLN
2.0000 g | Freq: Once | INTRAVENOUS | Status: AC
  Administered 2023-11-26: 2 g via INTRAVENOUS
  Filled 2023-11-26: qty 12.5

## 2023-11-26 MED ORDER — SODIUM CHLORIDE 0.9 % IV BOLUS
500.0000 mL | Freq: Once | INTRAVENOUS | Status: AC
  Administered 2023-11-26: 500 mL via INTRAVENOUS

## 2023-11-26 NOTE — ED Provider Notes (Addendum)
 [place] EMERGENCY DEPARTMENT AT [facility] Provider Note   CSN: [id number] Arrival date & time: 11/26/23  1910     Patient presents with: Abdominal Pain   Hannah [name] is a 89 y.o. female past medical history significant for diabetes, hypothyroidism, restrictive lung disease, and hypertension presents today for chest/epigastric abdominal pain that began yesterday.  Patient also appears short of breath on exam.  Patient and family deny fever, chill, nausea, vomiting, cough, congestion, headache, or urinary symptoms.    Abdominal Pain Associated symptoms: chest pain and shortness of breath        Prior to Admission medications   Medication Sig Start Date End Date Taking? Authorizing Provider  acetaminophen  (TYLENOL ) 325 MG tablet Take 650 mg by mouth every 6 (six) hours as needed for mild pain.     [provider]  albuterol  (VENTOLIN  HFA) 108 (90 Base) MCG/ACT inhaler Inhale 2 puffs into the lungs every 6 (six) hours as needed for wheezing or shortness of breath. 02/20/19   Icard, Adine CROME, DO  alum & mag hydroxide-simeth (MYLANTA MAXIMUM STRENGTH) 400-400-40 MG/5ML suspension Take 15 mLs by mouth every 6 (six) hours as needed for indigestion. 06/12/21   Badalamente, Peter R, PA-C  augmented betamethasone dipropionate (DIPROLENE-AF) 0.05 % cream Apply 1 application topically daily as needed (rash).  07/21/18   [provider]  Cholecalciferol  (VITAMIN D -3) 125 MCG (5000 UT) TABS Take 1,000 mg by mouth daily.    [provider]  cyanocobalamin  1000 MCG tablet Take 1,000 mcg by mouth daily.    [provider]  diclofenac  sodium (VOLTAREN ) 1 % GEL Apply 2 g topically 3 (three) times daily as needed (joint pain).    [provider]  ezetimibe -simvastatin  (VYTORIN ) 10-20 MG per tablet Take 1 tablet by mouth daily.     [provider]  insulin  glargine (LANTUS  SOLOSTAR) 100 UNIT/ML Solostar Pen Inject 10 Units into the skin at  bedtime.    [provider]  insulin  lispro (HUMALOG) 100 UNIT/ML KwikPen Inject 2-10 Units into the skin 3 (three) times daily. Sliding scale    [provider]  latanoprost  (XALATAN ) 0.005 % ophthalmic solution Place 1 drop into both eyes at bedtime. 02/20/21   [provider]  levothyroxine  (SYNTHROID ) 50 MCG tablet Take 1 tablet (50 mcg total) by mouth See admin instructions. Take 25mcg alternating 50mcg every other day. 03/03/21   Gherghe, Costin M, MD  lidocaine  (XYLOCAINE ) 2 % solution Use as directed 3 mLs in the mouth or throat daily as needed for mouth pain. 04/28/18   [provider]  megestrol  (MEGACE ) 400 MG/10ML suspension Take 400 mg by mouth daily.     [provider]  metoprolol  tartrate (LOPRESSOR ) 25 MG tablet Take 1 tablet (25 mg total) by mouth 2 (two) times daily. 03/03/21   Gherghe, Costin M, MD  MYRBETRIQ  25 MG TB24 tablet Take 25 mg by mouth daily. 12/03/20   [provider]  ondansetron  (ZOFRAN ) 4 MG tablet Take 1 tablet (4 mg total) by mouth every 8 (eight) hours as needed for nausea or vomiting. 02/15/23   Jerrol Agent, MD  pantoprazole  (PROTONIX ) 20 MG tablet Take 1 tablet (20 mg total) by mouth daily. 06/12/21 07/12/21  Eudelia Maude JONELLE, PA-C  sitaGLIPtin (JANUVIA) 25 MG tablet Take 50 mg by mouth daily.    [provider]  SYMBICORT  160-4.5 MCG/ACT inhaler Inhale 2 puffs by mouth twice daily 10/29/21   Ganji, Jay, MD  traMADol  (ULTRAM )  50 MG tablet Take 1 tablet (50 mg total) by mouth every 6 (six) hours as needed. 09/23/22   Geroldine Berg, MD    Allergies: Sulfur, Penicillins, and Sulfa antibiotics    Review of Systems  Respiratory:  Positive for shortness of breath.   Cardiovascular:  Positive for chest pain.  Gastrointestinal:  Positive for abdominal pain.    Updated Vital Signs BP (!) 195/93   Pulse (!) 111   Temp 98.4 F (36.9 C) (Oral)   Resp (!) 21   Ht 5' 2 (1.575 m)   Wt 44.8 kg   SpO2  98%   BMI 18.06 kg/m   Physical Exam Vitals and nursing note reviewed.  Constitutional:      General: She is not in acute distress.    Appearance: She is well-developed. She is not toxic-appearing or diaphoretic.  HENT:     Head: Normocephalic and atraumatic.  Eyes:     Extraocular Movements: Extraocular movements intact.     Conjunctiva/sclera: Conjunctivae normal.  Cardiovascular:     Rate and Rhythm: Regular rhythm. Tachycardia present.     Heart sounds: Normal heart sounds. No murmur heard. Pulmonary:     Effort: Pulmonary effort is normal. Tachypnea present. No respiratory distress.     Breath sounds: Wheezing present. No decreased breath sounds.  Abdominal:     General: There is no distension.     Palpations: Abdomen is soft.     Tenderness: There is generalized abdominal tenderness. Positive signs include Murphy's sign.  Musculoskeletal:        General: No swelling.     Cervical back: Neck supple.  Skin:    General: Skin is warm and dry.     Capillary Refill: Capillary refill takes less than 2 seconds.  Neurological:     General: No focal deficit present.     Mental Status: She is alert and oriented to person, place, and time.  Psychiatric:        Mood and Affect: Mood normal.     (all labs ordered are listed, but only abnormal results are displayed) Labs Reviewed  COMPREHENSIVE METABOLIC PANEL WITH GFR - Abnormal; Notable for the following components:      Result Value   Glucose, Bld 281 (*)    Creatinine, Ser 1.14 (*)    Calcium  10.4 (*)    GFR, Estimated 46 (*)    All other components within normal limits  CBC WITH DIFFERENTIAL/PLATELET - Abnormal; Notable for the following components:   WBC 15.8 (*)    Neutro Abs 13.1 (*)    Abs Immature Granulocytes 0.29 (*)    All other components within normal limits  D-DIMER, QUANTITATIVE - Abnormal; Notable for the following components:   D-Dimer, Quant 2.14 (*)    All other components within normal limits   URINALYSIS, W/ REFLEX TO CULTURE (INFECTION SUSPECTED) - Abnormal; Notable for the following components:   Specific Gravity, Urine 1.039 (*)    Glucose, UA 100 (*)    Protein, ur 30 (*)    Leukocytes,Ua TRACE (*)    Bacteria, UA RARE (*)    All other components within normal limits  LACTIC ACID, PLASMA - Abnormal; Notable for the following components:   Lactic Acid, Venous 2.6 (*)    All other components within normal limits  TROPONIN T, HIGH SENSITIVITY - Abnormal; Notable for the following components:   Troponin T High Sensitivity 28 (*)    All other components within normal limits  RESP PANEL  BY RT-PCR (RSV, FLU A&B, COVID)  RVPGX2  CULTURE, BLOOD (ROUTINE X 2)  CULTURE, BLOOD (ROUTINE X 2)  LIPASE, BLOOD  PROTIME-INR  LACTIC ACID, PLASMA  TROPONIN T, HIGH SENSITIVITY    EKG: EKG Interpretation Date/Time:  Saturday November 26 2023 19:49:09 EDT Ventricular Rate:  105 PR Interval:  179 QRS Duration:  87 QT Interval:  332 QTC Calculation: 439 R Axis:   84  Text Interpretation: Sinus tachycardia Borderline right axis deviation Baseline wander in lead(s) V5 Confirmed by Simon Rea [phone]) on 11/26/2023 7:50:45 PM  Radiology: CT Angio Chest PE W and/or Wo Contrast Result Date: 11/26/2023 CLINICAL DATA:  PE suspected, sepsis EXAM: CT ANGIOGRAPHY CHEST CT ABDOMEN AND PELVIS WITH CONTRAST TECHNIQUE: Multidetector CT imaging of the chest was performed using the standard protocol during bolus administration of intravenous contrast. Multiplanar CT image reconstructions and MIPs were obtained to evaluate the vascular anatomy. Multidetector CT imaging of the abdomen and pelvis was performed using the standard protocol during bolus administration of intravenous contrast. RADIATION DOSE REDUCTION: This exam was performed according to the departmental dose-optimization program which includes automated exposure control, adjustment of the mA and/or kV according to patient size and/or use of  iterative reconstruction technique. CONTRAST:  65mL OMNIPAQUE  IOHEXOL  350 MG/ML SOLN COMPARISON:  02/12/2023 FINDINGS: CT CHEST ANGIOGRAM FINDINGS Cardiovascular: Satisfactory opacification of the pulmonary arteries to the segmental level. No evidence of pulmonary embolism. Normal heart size. No pericardial effusion. Aortic atherosclerosis. Mediastinum/Nodes: No enlarged mediastinal, hilar, or axillary lymph nodes. Small hiatal hernia. Thyroid  gland, trachea, and esophagus demonstrate no significant findings. Lungs/Pleura: Benjamine appearing bandlike scarring of the right middle lobe and left lower lobe (series 4, image 71). No pleural effusion or pneumothorax. Musculoskeletal: No chest wall abnormality. No acute osseous findings. Review of the MIP images confirms the above findings. CT ABDOMEN PELVIS FINDINGS Hepatobiliary: No solid liver abnormality is seen. No gallstones, gallbladder wall thickening, or biliary dilatation. Pancreas: Unremarkable. No pancreatic ductal dilatation or surrounding inflammatory changes. Spleen: Normal in size without significant abnormality. Adrenals/Urinary Tract: Adrenal glands are unremarkable. Kidneys are normal, without renal calculi, solid lesion, or hydronephrosis. Bladder is unremarkable. Stomach/Bowel: Stomach is within normal limits. Appendix appears normal. The colon is decompressed. There is some suggestion of long segment wall thickening and mucosal hyperenhancement, most conspicuously in the transverse colon (series 9, image 57), and in the sigmoid colon (series 6, image 47). Occasional sigmoid diverticula. Vascular/Lymphatic: Aortic atherosclerosis. No enlarged abdominal or pelvic lymph nodes. Reproductive: No mass or other significant abnormality. Other: No abdominal wall hernia or abnormality. No ascites. Musculoskeletal: No acute or significant osseous findings. Review of the MIP images confirms the above findings. IMPRESSION: 1. Negative examination for pulmonary  embolism. 2. The colon is decompressed. There is some suggestion of long segment wall thickening and mucosal hyperenhancement, most conspicuously in the transverse colon, and in the sigmoid colon. Correlate for signs and symptoms of colitis. 3. Small hiatal hernia. Aortic Atherosclerosis (ICD10-I70.0). Electronically Signed   By: Marolyn JONETTA Jaksch M.D.   On: 11/26/2023 20:48   CT ABDOMEN PELVIS W CONTRAST Result Date: 11/26/2023 CLINICAL DATA:  PE suspected, sepsis EXAM: CT ANGIOGRAPHY CHEST CT ABDOMEN AND PELVIS WITH CONTRAST TECHNIQUE: Multidetector CT imaging of the chest was performed using the standard protocol during bolus administration of intravenous contrast. Multiplanar CT image reconstructions and MIPs were obtained to evaluate the vascular anatomy. Multidetector CT imaging of the abdomen and pelvis was performed using the standard protocol during bolus administration of intravenous contrast. RADIATION  DOSE REDUCTION: This exam was performed according to the departmental dose-optimization program which includes automated exposure control, adjustment of the mA and/or kV according to patient size and/or use of iterative reconstruction technique. CONTRAST:  65mL OMNIPAQUE  IOHEXOL  350 MG/ML SOLN COMPARISON:  02/12/2023 FINDINGS: CT CHEST ANGIOGRAM FINDINGS Cardiovascular: Satisfactory opacification of the pulmonary arteries to the segmental level. No evidence of pulmonary embolism. Normal heart size. No pericardial effusion. Aortic atherosclerosis. Mediastinum/Nodes: No enlarged mediastinal, hilar, or axillary lymph nodes. Small hiatal hernia. Thyroid  gland, trachea, and esophagus demonstrate no significant findings. Lungs/Pleura: Benjamine appearing bandlike scarring of the right middle lobe and left lower lobe (series 4, image 71). No pleural effusion or pneumothorax. Musculoskeletal: No chest wall abnormality. No acute osseous findings. Review of the MIP images confirms the above findings. CT ABDOMEN PELVIS  FINDINGS Hepatobiliary: No solid liver abnormality is seen. No gallstones, gallbladder wall thickening, or biliary dilatation. Pancreas: Unremarkable. No pancreatic ductal dilatation or surrounding inflammatory changes. Spleen: Normal in size without significant abnormality. Adrenals/Urinary Tract: Adrenal glands are unremarkable. Kidneys are normal, without renal calculi, solid lesion, or hydronephrosis. Bladder is unremarkable. Stomach/Bowel: Stomach is within normal limits. Appendix appears normal. The colon is decompressed. There is some suggestion of long segment wall thickening and mucosal hyperenhancement, most conspicuously in the transverse colon (series 9, image 57), and in the sigmoid colon (series 6, image 47). Occasional sigmoid diverticula. Vascular/Lymphatic: Aortic atherosclerosis. No enlarged abdominal or pelvic lymph nodes. Reproductive: No mass or other significant abnormality. Other: No abdominal wall hernia or abnormality. No ascites. Musculoskeletal: No acute or significant osseous findings. Review of the MIP images confirms the above findings. IMPRESSION: 1. Negative examination for pulmonary embolism. 2. The colon is decompressed. There is some suggestion of long segment wall thickening and mucosal hyperenhancement, most conspicuously in the transverse colon, and in the sigmoid colon. Correlate for signs and symptoms of colitis. 3. Small hiatal hernia. Aortic Atherosclerosis (ICD10-I70.0). Electronically Signed   By: Marolyn JONETTA Jaksch M.D.   On: 11/26/2023 20:48   DG Chest Port 1 View Result Date: 11/26/2023 CLINICAL DATA:  Chest pain EXAM: PORTABLE CHEST 1 VIEW COMPARISON:  06/12/2021 FINDINGS: Stable pulmonary hyperinflation in keeping with changes of underlying COPD. New nodular opacity noted at the left peripheral lung base, indeterminate. Lungs are otherwise clear. No pneumothorax or pleural effusion. Cardiac size within normal limits. No acute bone abnormality. IMPRESSION: 1. COPD. 2.  New nodular opacity at the left peripheral lung base, indeterminate. Standard two view chest radiograph would be helpful to exclude a imaging artifact after the patient's acute issues have resolved. Electronically Signed   By: Dorethia Molt M.D.   On: 11/26/2023 19:55     Procedures   Medications Ordered in the ED  lactated ringers  infusion ( Intravenous New Bag/Given 11/26/23 2016)  metroNIDAZOLE  (FLAGYL ) IVPB 500 mg (500 mg Intravenous New Bag/Given 11/26/23 2120)  vancomycin  (VANCOCIN ) IVPB 1000 mg/200 mL premix (has no administration in time range)  magnesium  sulfate IVPB 2 g 50 mL (2 g Intravenous New Bag/Given 11/26/23 2120)  metoprolol  tartrate (LOPRESSOR ) tablet 25 mg (25 mg Oral Given 11/26/23 2123)  ipratropium-albuterol  (DUONEB) 0.5-2.5 (3) MG/3ML nebulizer solution 3 mL (3 mLs Nebulization Given 11/26/23 1938)  ceFEPIme  (MAXIPIME ) 2 g in sodium chloride  0.9 % 100 mL IVPB (0 g Intravenous Stopped 11/26/23 2126)  iohexol  (OMNIPAQUE ) 350 MG/ML injection 100 mL (65 mLs Intravenous Contrast Given 11/26/23 2038)  methylPREDNISolone  sodium succinate (SOLU-MEDROL ) 125 mg/2 mL injection 125 mg (125 mg Intravenous Given 11/26/23 2116)  albuterol  (PROVENTIL ) (2.5 MG/3ML) 0.083% nebulizer solution 2.5 mg (2.5 mg Nebulization Given 11/26/23 2105)  ondansetron  (ZOFRAN ) injection 4 mg (4 mg Intravenous Given 11/26/23 2125)  morphine  (PF) 4 MG/ML injection 4 mg (4 mg Intravenous Given 11/26/23 2125)                                    Medical Decision Making CRITICAL CARE Performed by: Lavonia LOISE Pat   Total critical care time: 35 minutes  Critical care time was exclusive of separately billable procedures and treating other patients.  Critical care was necessary to treat or prevent imminent or life-threatening deterioration.  Critical care was time spent personally by me on the following activities: development of treatment plan with patient and/or surrogate as well as nursing, discussions with  consultants, evaluation of patient's response to treatment, examination of patient, obtaining history from patient or surrogate, ordering and performing treatments and interventions, ordering and review of laboratory studies, ordering and review of radiographic studies, pulse oximetry and re-evaluation of patient's condition.   Amount and/or Complexity of Data Reviewed Labs: ordered. Radiology: ordered.  Risk Prescription drug management. Decision regarding hospitalization.   This patient presents to the ED for concern of chest pain, abdominal pain, shortness of breath, this involves an extensive number of treatment options, and is a complaint that carries with it a high risk of complications and morbidity.  The differential diagnosis includes asthma exacerbation, COPD exacerbation, acute hypoxia, COVID, flu, RSV, GERD, STEMI, NSTEMI, arrhythmia, PE, pancreatitis, appendicitis, choledocholithiasis, acute cholecystitis, diverticulitis, UTI   Co morbidities / Chronic conditions that complicate the patient evaluation  Diabetes, hypertension, restrictive lung disease   Additional history obtained:  Additional history obtained from EMR External records from outside source obtained and reviewed including Care Everywhere   Lab Tests:  I Ordered, and personally interpreted labs.  The pertinent results include: Leukocytosis at 15.8 with left shift, elevated D-dimer at 2.14, mildly elevated creatinine 1.15, elevated glucose at 281, mildly elevated calcium  at 10.4, troponin 28, pro time and INR WNL, lactic acid 2.6, respiratory panel negative, UA with trace leukocytes, 30 protein, elevated specific gravity, rare bacteria, 6-10 WBCs Blood cultures pending   Imaging Studies ordered:  I ordered imaging studies including chest x-ray I independently visualized and interpreted imaging which showed COPD, new nodular opacity at the left peripheral lung base I agree with the radiologist  interpretation CTA PE: No PE CT abdomen pelvis with contrast which showed some suggestion of long segment wall thickening and mucosal hyperenhancement, most conspicuously in the transverse colon and in the sigmoid colon.  Small hiatal hernia   Cardiac Monitoring: / EKG:  The patient was maintained on a cardiac monitor.  I personally viewed and interpreted the cardiac monitored which showed an underlying rhythm of: Sinus tach, borderline right axis deviation   Problem List / ED Course / Critical interventions / Medication management I ordered medication including DuoNeb, cefepime , vancomycin , metronidazole , magnesium , Solu-Medrol  I have reviewed the patients home medicines and have made adjustments as needed Patient satting in the low 90s on exam and placed on 2 L nasal cannula   Consultations Obtained:  I requested consultation with the Hospitalist, Dr. Marcene,  and discussed lab and imaging findings as well as pertinent plan - they recommend: admission  Test / Admission - Considered:  Admit for sepsis likely secondary to colitis and acute respiratory failure likely due to COPD  Final diagnoses:  Sepsis, due to unspecified organism, unspecified whether acute organ dysfunction present [facility])  Acute respiratory failure with hypoxia [facility])  COPD exacerbation [facility])  Colitis    ED Discharge Orders     None          Keith, Kayla N, PA-C 11/26/23 2157    Simon Lavonia SAILOR, MD 11/26/23 2212    Simon Lavonia SAILOR, MD 12/09/23 1245

## 2023-11-26 NOTE — ED Triage Notes (Signed)
 Pt POV reporting epigastric/upper abd pain since yesterday. SOB noted in triage, hx asthma.

## 2023-11-26 NOTE — Sepsis Progress Note (Signed)
 Notified provider of need to order repeat lactic acid.

## 2023-11-26 NOTE — Sepsis Progress Note (Signed)
 Elink monitoring for the code sepsis protocol.

## 2023-11-26 NOTE — Progress Notes (Signed)
 Hospitalist Transfer Note:    Nursing staff, Please call [facility] Admits & Consults System-Wide number on Amion [phone]) as soon as patient's arrival, so appropriate admitting provider can evaluate the pt.   Transferring facility: [facility] Requesting provider: Ileana Eck, PA (EDP at [facility]) Reason for transfer: admission for further evaluation and management of severe sepsis due to colitis, complicated by acute hypoxic respiratory distress in the setting of acute COPD exacerbation.    89 year old female with history of COPD, who presented to [facility] ED complaining of abdominal pain over the last few days, as well as some shortness of breath over the last 1 to 2 days.  Over the last 2 days, she has also noted decreased oral intake in the setting of her abdominal discomfort.  No known baseline supplemental oxygen requirements.  On exam, EDP conveys that the patient exhibited evidence of increased work of breathing.  Vital signs in the ED were notable for the following: Afebrile, heart rates in the 90s to low 100s; respiratory rate 21-26.  Initial oxygen saturation 90% on room air, subsequently improving into the range of 98 to 100% on 2 L nasal cannula.  Labs were notable for CBC which showed white blood cell count of 15,800.  Initial lactic acid was mildly elevated at 2.6, with repeat lactate currently pending . blood cultures x 2 were collected followed by initiation of broad-spectrum undifferentiated IV antibiotics for suspected sepsis of unclear source at that time.  Imaging notable for CTA chest is reported to show no evidence of acute pulmonary embolism nor evidence of additional acute cardiopulmonary process.  CT abdomen/pelvis with contrast is reported to be suggestive of colitis, without evidence of bowel obstruction, perforation, or abscess.  In addition to the broad-spectrum IV biotics, the patient has also received Solu-Medrol , duo nebulizer treatment, prn albuterol  nebulizer treatment as well  as IV morphine , IV Zofran , and IV magnesium  sulfate.   Subsequently, I accepted this patient for transfer for inpatient admission to a PCU bed at [facility] or [facility]  (first available) for further work-up and management of the above.      Hannah Pore, DO Hospitalist

## 2023-11-27 ENCOUNTER — Encounter (HOSPITAL_COMMUNITY): Payer: Self-pay | Admitting: Internal Medicine

## 2023-11-27 DIAGNOSIS — J441 Chronic obstructive pulmonary disease with (acute) exacerbation: Secondary | ICD-10-CM | POA: Diagnosis present

## 2023-11-27 DIAGNOSIS — I10 Essential (primary) hypertension: Secondary | ICD-10-CM | POA: Diagnosis present

## 2023-11-27 DIAGNOSIS — E119 Type 2 diabetes mellitus without complications: Secondary | ICD-10-CM

## 2023-11-27 DIAGNOSIS — E039 Hypothyroidism, unspecified: Secondary | ICD-10-CM | POA: Diagnosis present

## 2023-11-27 DIAGNOSIS — K529 Noninfective gastroenteritis and colitis, unspecified: Secondary | ICD-10-CM | POA: Diagnosis not present

## 2023-11-27 DIAGNOSIS — N1831 Chronic kidney disease, stage 3a: Secondary | ICD-10-CM | POA: Diagnosis present

## 2023-11-27 LAB — BASIC METABOLIC PANEL WITH GFR
Anion gap: 9 (ref 5–15)
BUN: 17 mg/dL (ref 8–23)
CO2: 22 mmol/L (ref 22–32)
Calcium: 8.4 mg/dL — ABNORMAL LOW (ref 8.9–10.3)
Chloride: 107 mmol/L (ref 98–111)
Creatinine, Ser: 0.96 mg/dL (ref 0.44–1.00)
GFR, Estimated: 57 mL/min — ABNORMAL LOW (ref >60)
Glucose, Bld: 369 mg/dL — ABNORMAL HIGH (ref 70–99)
Potassium: 5.1 mmol/L (ref 3.5–5.1)
Sodium: 138 mmol/L (ref 135–145)

## 2023-11-27 LAB — GLUCOSE, CAPILLARY
Glucose-Capillary: 329 mg/dL — ABNORMAL HIGH (ref 70–99)
Glucose-Capillary: 353 mg/dL — ABNORMAL HIGH (ref 70–99)
Glucose-Capillary: 256 mg/dL — ABNORMAL HIGH (ref 70–99)
Glucose-Capillary: 196 mg/dL — ABNORMAL HIGH (ref 70–99)
Glucose-Capillary: 203 mg/dL — ABNORMAL HIGH (ref 70–99)

## 2023-11-27 LAB — CBC
HCT: 32.2 % — ABNORMAL LOW (ref 36.0–46.0)
Hemoglobin: 10 g/dL — ABNORMAL LOW (ref 12.0–15.0)
MCH: 30.3 pg (ref 26.0–34.0)
MCHC: 31.1 g/dL (ref 30.0–36.0)
MCV: 97.6 fL (ref 80.0–100.0)
Platelets: 234 K/uL (ref 150–400)
RBC: 3.3 MIL/uL — ABNORMAL LOW (ref 3.87–5.11)
RDW: 13.1 % (ref 11.5–15.5)
WBC: 18.5 K/uL — ABNORMAL HIGH (ref 4.0–10.5)
nRBC: 0 % (ref 0.0–0.2)

## 2023-11-27 LAB — LACTIC ACID, PLASMA: Lactic Acid, Venous: 2.6 mmol/L (ref 0.5–1.9)

## 2023-11-27 MED ORDER — MIRABEGRON ER 25 MG PO TB24
25.0000 mg | ORAL_TABLET | Freq: Every day | ORAL | Status: DC
  Administered 2023-11-27 – 2023-12-04 (×8): 25 mg via ORAL
  Filled 2023-11-27 (×8): qty 1

## 2023-11-27 MED ORDER — INSULIN ASPART 100 UNIT/ML IJ SOLN
0.0000 [IU] | Freq: Three times a day (TID) | INTRAMUSCULAR | Status: DC
  Administered 2023-11-27: 1 [IU] via SUBCUTANEOUS
  Administered 2023-11-27: 3 [IU] via SUBCUTANEOUS
  Administered 2023-11-27: 5 [IU] via SUBCUTANEOUS
  Administered 2023-11-28 (×2): 1 [IU] via SUBCUTANEOUS
  Administered 2023-11-28 – 2023-11-29 (×2): 3 [IU] via SUBCUTANEOUS
  Administered 2023-11-30: 4 [IU] via SUBCUTANEOUS
  Administered 2023-11-30: 3 [IU] via SUBCUTANEOUS
  Administered 2023-12-01: 5 [IU] via SUBCUTANEOUS
  Administered 2023-12-01: 6 [IU] via SUBCUTANEOUS
  Administered 2023-12-01: 5 [IU] via SUBCUTANEOUS
  Administered 2023-12-02: 1 [IU] via SUBCUTANEOUS
  Administered 2023-12-03: 6 [IU] via SUBCUTANEOUS

## 2023-11-27 MED ORDER — INSULIN ASPART 100 UNIT/ML IJ SOLN: 0.0000 [IU] | INTRAMUSCULAR | Status: DC

## 2023-11-27 MED ORDER — ONDANSETRON HCL 4 MG PO TABS
4.0000 mg | ORAL_TABLET | Freq: Four times a day (QID) | ORAL | Status: DC | PRN
Start: 2023-11-27 — End: 2023-12-04

## 2023-11-27 MED ORDER — SODIUM CHLORIDE 0.9% FLUSH
3.0000 mL | Freq: Two times a day (BID) | INTRAVENOUS | Status: DC
  Administered 2023-11-27 – 2023-12-04 (×15): 3 mL via INTRAVENOUS

## 2023-11-27 MED ORDER — ACETAMINOPHEN 325 MG PO TABS
650.0000 mg | ORAL_TABLET | Freq: Four times a day (QID) | ORAL | Status: DC | PRN
  Administered 2023-11-30 – 2023-12-03 (×2): 650 mg via ORAL
  Filled 2023-11-27 (×2): qty 2

## 2023-11-27 MED ORDER — PREDNISONE 20 MG PO TABS
40.0000 mg | ORAL_TABLET | Freq: Every day | ORAL | Status: DC
  Administered 2023-11-27 – 2023-11-29 (×3): 40 mg via ORAL
  Filled 2023-11-27 (×3): qty 2

## 2023-11-27 MED ORDER — INSULIN ASPART 100 UNIT/ML IJ SOLN: 0.0000 [IU] | Freq: Every day | INTRAMUSCULAR | Status: DC

## 2023-11-27 MED ORDER — ENOXAPARIN SODIUM 30 MG/0.3ML IJ SOSY
30.0000 mg | PREFILLED_SYRINGE | INTRAMUSCULAR | Status: DC
  Administered 2023-11-27 – 2023-11-30 (×4): 30 mg via SUBCUTANEOUS
  Filled 2023-11-27 (×4): qty 0.3

## 2023-11-27 MED ORDER — LEVOTHYROXINE SODIUM 50 MCG PO TABS
50.0000 ug | ORAL_TABLET | ORAL | Status: DC
  Administered 2023-11-27 – 2023-12-03 (×4): 50 ug via ORAL
  Filled 2023-11-27 (×4): qty 1

## 2023-11-27 MED ORDER — LEVOTHYROXINE SODIUM 25 MCG PO TABS
25.0000 ug | ORAL_TABLET | ORAL | Status: DC
  Administered 2023-11-28 – 2023-12-02 (×3): 25 ug via ORAL
  Filled 2023-11-27 (×3): qty 1

## 2023-11-27 MED ORDER — SODIUM CHLORIDE 0.9 % IV SOLN
1.0000 g | INTRAVENOUS | Status: AC
  Administered 2023-11-27 – 2023-11-30 (×4): 1 g via INTRAVENOUS
  Filled 2023-11-27 (×4): qty 10

## 2023-11-27 MED ORDER — INSULIN GLARGINE-YFGN 100 UNIT/ML ~~LOC~~ SOLN
5.0000 [IU] | Freq: Once | SUBCUTANEOUS | Status: AC
  Administered 2023-11-27: 5 [IU] via SUBCUTANEOUS
  Filled 2023-11-27: qty 0.05

## 2023-11-27 MED ORDER — METRONIDAZOLE 500 MG/100ML IV SOLN
500.0000 mg | Freq: Two times a day (BID) | INTRAVENOUS | Status: AC
  Administered 2023-11-27 – 2023-12-03 (×14): 500 mg via INTRAVENOUS
  Filled 2023-11-27 (×14): qty 100

## 2023-11-27 MED ORDER — OXYCODONE HCL 5 MG PO TABS: 2.5000 mg | ORAL_TABLET | ORAL | Status: DC | PRN

## 2023-11-27 MED ORDER — LATANOPROST 0.005 % OP SOLN
1.0000 [drp] | Freq: Every day | OPHTHALMIC | Status: DC
  Administered 2023-11-27 – 2023-12-02 (×6): 1 [drp] via OPHTHALMIC
  Filled 2023-11-27 (×3): qty 2.5

## 2023-11-27 MED ORDER — LACTATED RINGERS IV SOLN: INTRAVENOUS | Status: AC

## 2023-11-27 MED ORDER — INSULIN ASPART 100 UNIT/ML IJ SOLN
0.0000 [IU] | Freq: Every day | INTRAMUSCULAR | Status: DC
  Administered 2023-11-27: 2 [IU] via SUBCUTANEOUS
  Administered 2023-11-28 – 2023-11-30 (×3): 3 [IU] via SUBCUTANEOUS
  Administered 2023-12-01: 4 [IU] via SUBCUTANEOUS
  Administered 2023-12-02: 2 [IU] via SUBCUTANEOUS

## 2023-11-27 MED ORDER — FENTANYL CITRATE PF 50 MCG/ML IJ SOSY: 12.5000 ug | PREFILLED_SYRINGE | INTRAMUSCULAR | Status: DC | PRN

## 2023-11-27 MED ORDER — ALBUTEROL SULFATE (2.5 MG/3ML) 0.083% IN NEBU
2.5000 mg | INHALATION_SOLUTION | RESPIRATORY_TRACT | Status: DC | PRN
  Administered 2023-12-01: 2.5 mg via RESPIRATORY_TRACT
  Filled 2023-11-27: qty 3

## 2023-11-27 MED ORDER — SODIUM CHLORIDE 0.9 % IV BOLUS
1000.0000 mL | Freq: Once | INTRAVENOUS | Status: AC
  Administered 2023-11-27: 1000 mL via INTRAVENOUS

## 2023-11-27 MED ORDER — ACETAMINOPHEN 650 MG RE SUPP: 650.0000 mg | Freq: Four times a day (QID) | RECTAL | Status: DC | PRN

## 2023-11-27 MED ORDER — ONDANSETRON HCL 4 MG/2ML IJ SOLN: 4.0000 mg | Freq: Four times a day (QID) | INTRAMUSCULAR | Status: DC | PRN

## 2023-11-27 MED ORDER — INSULIN ASPART 100 UNIT/ML IJ SOLN: 0.0000 [IU] | Freq: Three times a day (TID) | INTRAMUSCULAR | Status: DC

## 2023-11-27 MED ORDER — SODIUM CHLORIDE 0.9 % IV SOLN: 2.0000 g | INTRAVENOUS | Status: DC

## 2023-11-27 NOTE — Progress Notes (Addendum)
 Progress Note   Patient: Hannah [name] DOB: 05-26-1932 DOA: 11/26/2023     1 DOS: the patient was seen and examined on 11/27/2023   Brief hospital course: Per HPI:  Hannah [name] is an 89 y.o. female with medical history significant for buccal cancer status postresection and radiation, hypertension, hypothyroidism, type 2 diabetes mellitus, chronic lumbar radiculopathy, asthma, and CKD 3A who presents with abdominal pain.   Patient complains of pain across her upper abdomen.  She also reports experiencing diarrhea every time she sits down to urinate.  She denies vomiting.  She also notes feeling short of breath recently but denies any significant cough.  Assessment and Plan: Possible Colitis Presented with abdominal pain.  Reportedly with no diarrhea.  Found to have leukocytosis and elevated lactate on labs and CT abdomen findings concerning for colitis.  Patient has not had a bowel movement since being hospitalized, subsequently C. difficile and GI pathogen panel have not been collected. -Continue empiric antibiotics for now -Continue to trend lactate -Consider GI c/s in the AM, given multiple hospitalizations for abdominal pain and no clear etiology   Asthma, acute exacerbation Patient has as needed albuterol , but reportedly rarely uses.  Unclear if she uses her Symbicort .  She had no dyspnea but noted to have wheezing.  Her oxygen saturation were within normal limits. - Continue as needed albuterol  nebs - Continue systemic steroids for now, likely transition patient back to inhaled corticosteroids in the AM   Elevated blood glucose On admission. Possibly in the setting of infection vs steroid induced.  -F/u A1c  -Continue CBG checks with SSI   HTN Slightly elevated Continue metoprolol    CKD3a     Latest Ref Rng & Units 11/27/2023    2:59 AM 11/26/2023    7:37 PM 02/12/2023    9:57 PM  BMP  Glucose 70 - 99 mg/dL 630  718  857   BUN 8 - 23 mg/dL 17  18  13     Creatinine 0.44 - 1.00 mg/dL 9.03  8.85  9.01   Sodium 135 - 145 mmol/L 138  141  140   Potassium 3.5 - 5.1 mmol/L 5.1  4.3  4.2   Chloride 98 - 111 mmol/L 107  103  106   CO2 22 - 32 mmol/L 22  23  24    Calcium  8.9 - 10.3 mg/dL 8.4  89.5  9.9    Appears to be at baseline.    Hypothyroidism  Continue synthroid        Subjective: Patient's daughter is at bedside and is assisting with history.    Last bowel movement on the day of admission.  Denies frequent bowel movements this a.m.  States that her abdominal pain started about 3 days ago.  She denies any known inciting incident.  She denies any exacerbating or alleviating factors..  She has presented to the ED twice in 2024 for similar symptoms.  During 1 ED visit, she was noted to have colitis.   Of note, patient urinating more than usual. Concern for UTI.  She was seen by her PCP.  Urinalysis was not concerning for infection.  She was given given antispasmodic and this helped with her symptoms.    Physical Exam: Vitals:   11/26/23 2230 11/27/23 0017 11/27/23 0356 11/27/23 0801  BP: (!) 146/62 (!) 164/71 (!) 156/70 (!) 156/64  Pulse: 89 73 73 70  Resp: 18     Temp: 98.3 F (36.8 C) (!) 97.5 F (36.4 C)  98.7 F (37.1 C) 98.2 F (36.8 C)  TempSrc:  Oral Oral Oral  SpO2: 100% 98% 98% 97%  Weight:      Height:       Physical Exam  Constitutional: In no distress.  Cardiovascular: Normal rate, regular rhythm. No lower extremity edema  Pulmonary: Non labored breathing on room air, no rales, diffuse wheezing  Abdominal: Soft. Normal bowel sounds. Non distended and TTP R side of abdomen.  Musculoskeletal: Normal range of motion.     Neurological: Alert and oriented to person, place, and time. Non focal  Skin: Skin is warm and dry.   Data Reviewed:     Latest Ref Rng & Units 11/27/2023    2:59 AM 11/26/2023    7:37 PM 02/12/2023    9:57 PM  BMP  Glucose 70 - 99 mg/dL 630  718  857   BUN 8 - 23 mg/dL 17  18  13     Creatinine 0.44 - 1.00 mg/dL 9.03  8.85  9.01   Sodium 135 - 145 mmol/L 138  141  140   Potassium 3.5 - 5.1 mmol/L 5.1  4.3  4.2   Chloride 98 - 111 mmol/L 107  103  106   CO2 22 - 32 mmol/L 22  23  24    Calcium  8.9 - 10.3 mg/dL 8.4  89.5  9.9    Serum creatinine normalized.  Hyperglycemia      Latest Ref Rng & Units 11/27/2023    2:59 AM 11/26/2023    7:37 PM 02/12/2023    9:57 PM  CBC  WBC 4.0 - 10.5 K/uL 18.5  15.8  11.5   Hemoglobin 12.0 - 15.0 g/dL 89.9  87.6  87.5   Hematocrit 36.0 - 46.0 % 32.2  38.3  37.7   Platelets 150 - 400 K/uL 234  262  173    Worsening leukocytosis in the setting of steroid use   Family Communication: Daughter at bedside  Disposition: Status is: Inpatient Remains inpatient appropriate because: Further workup of patient's abdominal pain  Planned Discharge Destination: Home    Time spent: 35 minutes  Author: Alban Pepper, MD 11/27/2023 8:44 AM  For on call review [email or web address].

## 2023-11-27 NOTE — H&P (Addendum)
 History and Physical    Hannah [name] FMW:[id number] DOB: February 26, 1933 DOA: 11/26/2023  PCP: Sun, Vyvyan, MD   Patient coming from: Home   Chief Complaint: Abdominal pain    HPI: Hannah [name] is an 89 y.o. female with medical history significant for buccal cancer status postresection and radiation, hypertension, hypothyroidism, type 2 diabetes mellitus, chronic lumbar radiculopathy, asthma, and CKD 3A who presents with abdominal pain.  Patient complains of pain across her upper abdomen.  She also reports experiencing diarrhea every time she sits down to urinate.  She denies vomiting.  She also notes feeling short of breath recently but denies any significant cough.  [facility] ED Course: Upon arrival to the ED, patient is found to be afebrile and saturating 90% on room air with tachypnea, tachycardia, and elevated BP.  Labs are most notable for glucose 281, creatinine 1.14, WBC 15,800, and lactic acid 3.4.  CTA chest is negative for PE.  CT of the abdomen and pelvis reveals long segment of colonic wall thickening and mural hyperenhancement.  Blood cultures were collected and the patient was given 500 mL of NS, 2 g IV magnesium , Zofran , morphine , Lopressor , vancomycin , cefepime , Flagyl , DuoNeb, albuterol , and IV Solu-Medrol .  She was transferred to [facility] for admission.  Review of Systems:  All other systems reviewed and apart from HPI, are negative.  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   Spinal stenosis    Tuberculosis    1978   Weight loss, unintentional     Past Surgical History:  Procedure Laterality Date   AORTIC ARCH ANGIOGRAPHY  09/2019   BACK SURGERY     COLONOSCOPY     EYE SURGERY Bilateral    cataracts   LIPOMA EXCISION     back   LUMBAR LAMINECTOMY/DECOMPRESSION MICRODISCECTOMY Right 04/12/2018    Procedure: Laminectomy and Foraminotomy - Lumbar four-Lumbar five - Lumbar five-Sacral one - right;  Surgeon: Joshua Alm RAMAN, MD;  Location: [facility] OR;  Service: Neurosurgery;  Laterality: Right;   LUMBAR SPINE SURGERY  05/2019   MASS EXCISION Left 04/02/2019   Procedure: EXCISION OF LIPOMATOUS MASS OF THE LEFT BACK;  Surgeon: Gladis Cough, MD;  Location: [facility];  Service: General;  Laterality: Left;   OTHER SURGICAL HISTORY     squamous cell removal;skin graft   RIGHT/LEFT HEART CATH AND CORONARY ANGIOGRAPHY N/A 09/18/2019   Procedure: RIGHT/LEFT HEART CATH AND CORONARY ANGIOGRAPHY;  Surgeon: Ladona Heinz, MD;  Location: [facility];  Service: Cardiovascular;  Laterality: N/A;   TOOTH EXTRACTION     all pulled    Social History:   reports that she has never smoked. She has never used smokeless tobacco. She reports that she does not drink alcohol and does not use drugs.  Allergies  Allergen Reactions   Sulfur    Penicillins Rash    1980's   Sulfa Antibiotics Rash    Family History  Problem Relation Age of Onset   Arthritis Mother        Late 79s   Heart attack Father        Deceased, 15   Heart disease Brother    Hypertension Other    Hyperlipidemia Other    Diabetes type II Sister    Diabetes Brother  Prior to Admission medications   Medication Sig Start Date End Date Taking? Authorizing Provider  HYDROcodone -acetaminophen  (NORCO/VICODIN) 5-325 MG tablet Take 1 tablet by mouth 3 (three) times daily as needed (PAIN). 10/24/23  Yes [provider]  nitrofurantoin, macrocrystal-monohydrate, (MACROBID) 100 MG capsule Take 100 mg by mouth every 12 (twelve) hours. 11/17/23  Yes [provider]  omeprazole (PRILOSEC) 20 MG capsule Take 20 mg by mouth every morning. 09/18/23  Yes [provider]  acetaminophen  (TYLENOL ) 325 MG tablet Take 650 mg by mouth every 6 (six) hours as needed for mild pain.     [provider]   albuterol  (VENTOLIN  HFA) 108 (90 Base) MCG/ACT inhaler Inhale 2 puffs into the lungs every 6 (six) hours as needed for wheezing or shortness of breath. 02/20/19   Icard, Adine CROME, DO  alum & mag hydroxide-simeth (MYLANTA MAXIMUM STRENGTH) 400-400-40 MG/5ML suspension Take 15 mLs by mouth every 6 (six) hours as needed for indigestion. 06/12/21   Badalamente, Peter R, PA-C  augmented betamethasone dipropionate (DIPROLENE-AF) 0.05 % cream Apply 1 application topically daily as needed (rash).  07/21/18   [provider]  Cholecalciferol  (VITAMIN D -3) 125 MCG (5000 UT) TABS Take 1,000 mg by mouth daily.    [provider]  cyanocobalamin  1000 MCG tablet Take 1,000 mcg by mouth daily.    [provider]  diclofenac  sodium (VOLTAREN ) 1 % GEL Apply 2 g topically 3 (three) times daily as needed (joint pain).    [provider]  donepezil  (ARICEPT ) 5 MG tablet Take 5 mg by mouth at bedtime.    [provider]  ezetimibe -simvastatin  (VYTORIN ) 10-20 MG per tablet Take 1 tablet by mouth daily.     [provider]  insulin  glargine (LANTUS  SOLOSTAR) 100 UNIT/ML Solostar Pen Inject 10 Units into the skin at bedtime.    [provider]  insulin  lispro (HUMALOG) 100 UNIT/ML KwikPen Inject 2-10 Units into the skin 3 (three) times daily. Sliding scale    [provider]  JANUVIA 50 MG tablet Take 50 mg by mouth daily.    [provider]  latanoprost  (XALATAN ) 0.005 % ophthalmic solution Place 1 drop into both eyes at bedtime. 02/20/21   [provider]  levothyroxine  (SYNTHROID ) 50 MCG tablet Take 1 tablet (50 mcg total) by mouth See admin instructions. Take 25mcg alternating 50mcg every other day. 03/03/21   Gherghe, Costin M, MD  lidocaine  (XYLOCAINE ) 2 % solution Use as directed 3 mLs in the mouth or throat daily as needed for mouth pain. 04/28/18   [provider]  megestrol  (MEGACE ) 400 MG/10ML suspension Take 400 mg by  mouth daily.     [provider]  metoprolol  tartrate (LOPRESSOR ) 25 MG tablet Take 1 tablet (25 mg total) by mouth 2 (two) times daily. 03/03/21   Gherghe, Costin M, MD  MYRBETRIQ  25 MG TB24 tablet Take 25 mg by mouth daily. 12/03/20   [provider]  ondansetron  (ZOFRAN ) 4 MG tablet Take 1 tablet (4 mg total) by mouth every 8 (eight) hours as needed for nausea or vomiting. 02/15/23   Jerrol Agent, MD  pantoprazole  (PROTONIX ) 20 MG tablet Take 1 tablet (20 mg total) by mouth daily. 06/12/21 07/12/21  Eudelia Maude SAUNDERS, PA-C  SYMBICORT  160-4.5 MCG/ACT inhaler Inhale 2 puffs by mouth twice daily 10/29/21   Ganji, Jay, MD  traMADol  (ULTRAM ) 50 MG tablet Take 1 tablet (50 mg total) by mouth every 6 (six) hours as needed. 09/23/22  Geroldine Berg, MD    Physical Exam: Vitals:   11/26/23 2200 11/26/23 2220 11/26/23 2230 11/27/23 0017  BP: (!) 157/61  (!) 146/62 (!) 164/71  Pulse: (!) 104 88 89 73  Resp: 19 15 18    Temp:   98.3 F (36.8 C) (!) 97.5 F (36.4 C)  TempSrc:    Oral  SpO2: 100% 98% 100% 98%  Weight:      Height:        Constitutional: NAD, no pallor or diaphoresis   Eyes: PERTLA, lids and conjunctivae normal ENMT: Mucous membranes are moist. Posterior pharynx clear of any exudate or lesions.   Neck: supple, no masses  Respiratory: Dyspneic with speech. Expiratory wheezing.    Cardiovascular: S1 & S2 heard, regular rate and rhythm. No JVD.  Abdomen: Soft, tender in upper abdomen and LLQ, no guarding. Bowel sounds active.  Musculoskeletal: no clubbing / cyanosis. No joint deformity upper and lower extremities.   Skin: no significant rashes, lesions, ulcers. Warm, dry, well-perfused. Neurologic: CN 2-12 grossly intact. Moving all extremities. Alert and oriented to person, place, and situation.  Psychiatric: Calm. Cooperative.    Labs and Imaging on Admission: I have personally reviewed following labs and imaging studies  CBC: Recent Labs  Lab  11/26/23 1937  WBC 15.8*  NEUTROABS 13.1*  HGB 12.3  HCT 38.3  MCV 94.6  PLT 262   Basic Metabolic Panel: Recent Labs  Lab 11/26/23 1937  NA 141  K 4.3  CL 103  CO2 23  GLUCOSE 281*  BUN 18  CREATININE 1.14*  CALCIUM  10.4*   GFR: Estimated Creatinine Clearance: 24.1 mL/min (A) (by C-G formula based on SCr of 1.14 mg/dL (H)). Liver Function Tests: Recent Labs  Lab 11/26/23 1937  AST 21  ALT 14  ALKPHOS 49  BILITOT 0.5  PROT 7.5  ALBUMIN 4.2   Recent Labs  Lab 11/26/23 1937  LIPASE 17   No results for input(s): AMMONIA in the last 168 hours. Coagulation Profile: Recent Labs  Lab 11/26/23 1948  INR 1.1   Cardiac Enzymes: No results for input(s): CKTOTAL, CKMB, CKMBINDEX, TROPONINI in the last 168 hours. BNP (last 3 results) No results for input(s): PROBNP in the last 8760 hours. HbA1C: No results for input(s): HGBA1C in the last 72 hours. CBG: Recent Labs  Lab 11/26/23 2352  GLUCAP 331*   Lipid Profile: No results for input(s): CHOL, HDL, LDLCALC, TRIG, CHOLHDL, LDLDIRECT in the last 72 hours. Thyroid  Function Tests: No results for input(s): TSH, T4TOTAL, FREET4, T3FREE, THYROIDAB in the last 72 hours. Anemia Panel: No results for input(s): VITAMINB12, FOLATE, FERRITIN, TIBC, IRON , RETICCTPCT in the last 72 hours. Urine analysis:    Component Value Date/Time   COLORURINE YELLOW 11/26/2023 2043   APPEARANCEUR CLEAR 11/26/2023 2043   LABSPEC 1.039 (H) 11/26/2023 2043   PHURINE 6.0 11/26/2023 2043   GLUCOSEU 100 (A) 11/26/2023 2043   HGBUR NEGATIVE 11/26/2023 2043   BILIRUBINUR NEGATIVE 11/26/2023 2043   KETONESUR NEGATIVE 11/26/2023 2043   PROTEINUR 30 (A) 11/26/2023 2043   UROBILINOGEN 0.2 09/27/2012 1638   NITRITE NEGATIVE 11/26/2023 2043   LEUKOCYTESUR TRACE (A) 11/26/2023 2043   Sepsis Labs: @LABRCNTIP (procalcitonin:4,lacticidven:4) ) Recent Results (from the past 240 hours)  Resp panel  by RT-PCR (RSV, Flu A&B, Covid) Anterior Nasal Swab     Status: None   Collection Time: 11/26/23  7:37 PM   Specimen: Anterior Nasal Swab  Result Value Ref Range Status   SARS Coronavirus 2 by RT  PCR NEGATIVE NEGATIVE Final    Comment: (NOTE) SARS-CoV-2 target nucleic acids are NOT DETECTED.  The SARS-CoV-2 RNA is generally detectable in upper respiratory specimens during the acute phase of infection. The lowest concentration of SARS-CoV-2 viral copies this assay can detect is 138 copies/mL. A negative result does not preclude SARS-Cov-2 infection and should not be used as the sole basis for treatment or other patient management decisions. A negative result may occur with  improper specimen collection/handling, submission of specimen other than nasopharyngeal swab, presence of viral mutation(s) within the areas targeted by this assay, and inadequate number of viral copies(<138 copies/mL). A negative result must be combined with clinical observations, patient history, and epidemiological information. The expected result is Negative.  Fact Sheet for Patients:  BloggerCourse.com  Fact Sheet for Healthcare Providers:  SeriousBroker.it  This test is no t yet approved or cleared by the [place]  FDA and  has been authorized for detection and/or diagnosis of SARS-CoV-2 by FDA under an Emergency Use Authorization (EUA). This EUA will remain  in effect (meaning this test can be used) for the duration of the COVID-19 declaration under Section 564(b)(1) of the Act, 21 U.S.C.section 360bbb-3(b)(1), unless the authorization is terminated  or revoked sooner.       Influenza A by PCR NEGATIVE NEGATIVE Final   Influenza B by PCR NEGATIVE NEGATIVE Final    Comment: (NOTE) The Xpert Xpress SARS-CoV-2/FLU/RSV plus assay is intended as an aid in the diagnosis of influenza from Nasopharyngeal swab specimens and should not be used as a sole  basis for treatment. Nasal washings and aspirates are unacceptable for Xpert Xpress SARS-CoV-2/FLU/RSV testing.  Fact Sheet for Patients: BloggerCourse.com  Fact Sheet for Healthcare Providers: SeriousBroker.it  This test is not yet approved or cleared by the [place]  FDA and has been authorized for detection and/or diagnosis of SARS-CoV-2 by FDA under an Emergency Use Authorization (EUA). This EUA will remain in effect (meaning this test can be used) for the duration of the COVID-19 declaration under Section 564(b)(1) of the Act, 21 U.S.C. section 360bbb-3(b)(1), unless the authorization is terminated or revoked.     Resp Syncytial Virus by PCR NEGATIVE NEGATIVE Final    Comment: (NOTE) Fact Sheet for Patients: BloggerCourse.com  Fact Sheet for Healthcare Providers: SeriousBroker.it  This test is not yet approved or cleared by the [place]  FDA and has been authorized for detection and/or diagnosis of SARS-CoV-2 by FDA under an Emergency Use Authorization (EUA). This EUA will remain in effect (meaning this test can be used) for the duration of the COVID-19 declaration under Section 564(b)(1) of the Act, 21 U.S.C. section 360bbb-3(b)(1), unless the authorization is terminated or revoked.  Performed at [facility], [address] [place], [place] [address]   Blood Culture (routine x 2)     Status: None (Preliminary result)   Collection Time: 11/26/23  7:48 PM   Specimen: BLOOD  Result Value Ref Range Status   Specimen Description   Final    BLOOD RIGHT ANTECUBITAL Performed at [facility], [address] [place], [place] [address]    Special Requests   Final    BOTTLES DRAWN AEROBIC AND ANAEROBIC Blood Culture results may not be optimal due to an inadequate volume of blood received in culture bottles Performed at [facility], [address], [place], [place] [address]    Culture PENDING  Incomplete   Report Status PENDING  Incomplete     Radiological Exams on  Admission: CT Angio Chest PE W and/or Wo Contrast Result Date: 11/26/2023 CLINICAL DATA:  PE suspected, sepsis EXAM: CT ANGIOGRAPHY CHEST CT ABDOMEN AND PELVIS WITH CONTRAST TECHNIQUE: Multidetector CT imaging of the chest was performed using the standard protocol during bolus administration of intravenous contrast. Multiplanar CT image reconstructions and MIPs were obtained to evaluate the vascular anatomy. Multidetector CT imaging of the abdomen and pelvis was performed using the standard protocol during bolus administration of intravenous contrast. RADIATION DOSE REDUCTION: This exam was performed according to the departmental dose-optimization program which includes automated exposure control, adjustment of the mA and/or kV according to patient size and/or use of iterative reconstruction technique. CONTRAST:  65mL OMNIPAQUE  IOHEXOL  350 MG/ML SOLN COMPARISON:  02/12/2023 FINDINGS: CT CHEST ANGIOGRAM FINDINGS Cardiovascular: Satisfactory opacification of the pulmonary arteries to the segmental level. No evidence of pulmonary embolism. Normal heart size. No pericardial effusion. Aortic atherosclerosis. Mediastinum/Nodes: No enlarged mediastinal, hilar, or axillary lymph nodes. Small hiatal hernia. Thyroid  gland, trachea, and esophagus demonstrate no significant findings. Lungs/Pleura: Benjamine appearing bandlike scarring of the right middle lobe and left lower lobe (series 4, image 71). No pleural effusion or pneumothorax. Musculoskeletal: No chest wall abnormality. No acute osseous findings. Review of the MIP images confirms the above findings. CT ABDOMEN PELVIS FINDINGS Hepatobiliary: No solid liver abnormality is seen. No gallstones, gallbladder wall thickening, or biliary dilatation. Pancreas: Unremarkable. No pancreatic ductal dilatation or surrounding  inflammatory changes. Spleen: Normal in size without significant abnormality. Adrenals/Urinary Tract: Adrenal glands are unremarkable. Kidneys are normal, without renal calculi, solid lesion, or hydronephrosis. Bladder is unremarkable. Stomach/Bowel: Stomach is within normal limits. Appendix appears normal. The colon is decompressed. There is some suggestion of long segment wall thickening and mucosal hyperenhancement, most conspicuously in the transverse colon (series 9, image 57), and in the sigmoid colon (series 6, image 47). Occasional sigmoid diverticula. Vascular/Lymphatic: Aortic atherosclerosis. No enlarged abdominal or pelvic lymph nodes. Reproductive: No mass or other significant abnormality. Other: No abdominal wall hernia or abnormality. No ascites. Musculoskeletal: No acute or significant osseous findings. Review of the MIP images confirms the above findings. IMPRESSION: 1. Negative examination for pulmonary embolism. 2. The colon is decompressed. There is some suggestion of long segment wall thickening and mucosal hyperenhancement, most conspicuously in the transverse colon, and in the sigmoid colon. Correlate for signs and symptoms of colitis. 3. Small hiatal hernia. Aortic Atherosclerosis (ICD10-I70.0). Electronically Signed   By: Marolyn JONETTA Jaksch M.D.   On: 11/26/2023 20:48   CT ABDOMEN PELVIS W CONTRAST Result Date: 11/26/2023 CLINICAL DATA:  PE suspected, sepsis EXAM: CT ANGIOGRAPHY CHEST CT ABDOMEN AND PELVIS WITH CONTRAST TECHNIQUE: Multidetector CT imaging of the chest was performed using the standard protocol during bolus administration of intravenous contrast. Multiplanar CT image reconstructions and MIPs were obtained to evaluate the vascular anatomy. Multidetector CT imaging of the abdomen and pelvis was performed using the standard protocol during bolus administration of intravenous contrast. RADIATION DOSE REDUCTION: This exam was performed according to the departmental dose-optimization  program which includes automated exposure control, adjustment of the mA and/or kV according to patient size and/or use of iterative reconstruction technique. CONTRAST:  65mL OMNIPAQUE  IOHEXOL  350 MG/ML SOLN COMPARISON:  02/12/2023 FINDINGS: CT CHEST ANGIOGRAM FINDINGS Cardiovascular: Satisfactory opacification of the pulmonary arteries to the segmental level. No evidence of pulmonary embolism. Normal heart size. No pericardial effusion. Aortic atherosclerosis. Mediastinum/Nodes: No enlarged mediastinal, hilar, or axillary lymph nodes. Small hiatal hernia. Thyroid  gland, trachea, and esophagus demonstrate no significant findings. Lungs/Pleura: Benjamine  appearing bandlike scarring of the right middle lobe and left lower lobe (series 4, image 71). No pleural effusion or pneumothorax. Musculoskeletal: No chest wall abnormality. No acute osseous findings. Review of the MIP images confirms the above findings. CT ABDOMEN PELVIS FINDINGS Hepatobiliary: No solid liver abnormality is seen. No gallstones, gallbladder wall thickening, or biliary dilatation. Pancreas: Unremarkable. No pancreatic ductal dilatation or surrounding inflammatory changes. Spleen: Normal in size without significant abnormality. Adrenals/Urinary Tract: Adrenal glands are unremarkable. Kidneys are normal, without renal calculi, solid lesion, or hydronephrosis. Bladder is unremarkable. Stomach/Bowel: Stomach is within normal limits. Appendix appears normal. The colon is decompressed. There is some suggestion of long segment wall thickening and mucosal hyperenhancement, most conspicuously in the transverse colon (series 9, image 57), and in the sigmoid colon (series 6, image 47). Occasional sigmoid diverticula. Vascular/Lymphatic: Aortic atherosclerosis. No enlarged abdominal or pelvic lymph nodes. Reproductive: No mass or other significant abnormality. Other: No abdominal wall hernia or abnormality. No ascites. Musculoskeletal: No acute or significant  osseous findings. Review of the MIP images confirms the above findings. IMPRESSION: 1. Negative examination for pulmonary embolism. 2. The colon is decompressed. There is some suggestion of long segment wall thickening and mucosal hyperenhancement, most conspicuously in the transverse colon, and in the sigmoid colon. Correlate for signs and symptoms of colitis. 3. Small hiatal hernia. Aortic Atherosclerosis (ICD10-I70.0). Electronically Signed   By: Marolyn JONETTA Jaksch M.D.   On: 11/26/2023 20:48   DG Chest Port 1 View Result Date: 11/26/2023 CLINICAL DATA:  Chest pain EXAM: PORTABLE CHEST 1 VIEW COMPARISON:  06/12/2021 FINDINGS: Stable pulmonary hyperinflation in keeping with changes of underlying COPD. New nodular opacity noted at the left peripheral lung base, indeterminate. Lungs are otherwise clear. No pneumothorax or pleural effusion. Cardiac size within normal limits. No acute bone abnormality. IMPRESSION: 1. COPD. 2. New nodular opacity at the left peripheral lung base, indeterminate. Standard two view chest radiograph would be helpful to exclude a imaging artifact after the patient's acute issues have resolved. Electronically Signed   By: Dorethia Molt M.D.   On: 11/26/2023 19:55    EKG: Independently reviewed. Sinus tachycardia, rate 105.   Assessment/Plan   1. Colitis  - Presents with abdominal pain and diarrhea, found to have leukocytosis and CT findings concerning for colitis, infectious vs ischemic  - Check C diff testing and GI pathogen panel, continue IVF hydration and empiric antibiotics, trend lactate, monitor/correct electrolytes    2. Asthma exacerbation  - Continue systemic steroid and short-acting bronchodilators    3. Type II DM  - Check CBGs and use low-intensity SSI for now and adjust as needed    4. Hypertension  - Continue metoprolol     5. CKD 3A  - Appears close to baseline  - Renally-dose medications, monitor    6. Hypothyroidism  - Synthroid     DVT prophylaxis:  Lovenox   Code Status: DNR  Level of Care: Level of care: Progressive Family Communication: Daughters updated by RN  Disposition Plan:  Patient is from: home  Anticipated d/c is to: TBD Anticipated d/c date is: 11/29/23  Patient currently: Pending improved respiratory status, pain-control, stool studies  Consults called: none  Admission status: Inpatient     Evalene GORMAN Sprinkles, MD [facility]  11/27/2023, 1:18 AM

## 2023-11-27 NOTE — Plan of Care (Signed)

## 2023-11-28 DIAGNOSIS — K529 Noninfective gastroenteritis and colitis, unspecified: Secondary | ICD-10-CM | POA: Diagnosis not present

## 2023-11-28 LAB — CBC WITH DIFFERENTIAL/PLATELET
Abs Immature Granulocytes: 0.64 K/uL — ABNORMAL HIGH (ref 0.00–0.07)
Abs Immature Granulocytes: 0.62 K/uL — ABNORMAL HIGH (ref 0.00–0.07)
Basophils Absolute: 0 K/uL (ref 0.0–0.1)
Basophils Absolute: 0.1 K/uL (ref 0.0–0.1)
Basophils Relative: 0 %
Basophils Relative: 0 %
Eosinophils Absolute: 0 K/uL (ref 0.0–0.5)
Eosinophils Absolute: 0 K/uL (ref 0.0–0.5)
Eosinophils Relative: 0 %
Eosinophils Relative: 0 %
HCT: 30.1 % — ABNORMAL LOW (ref 36.0–46.0)
HCT: 30.8 % — ABNORMAL LOW (ref 36.0–46.0)
Hemoglobin: 9.5 g/dL — ABNORMAL LOW (ref 12.0–15.0)
Hemoglobin: 10 g/dL — ABNORMAL LOW (ref 12.0–15.0)
Immature Granulocytes: 3 %
Immature Granulocytes: 3 %
Lymphocytes Relative: 8 %
Lymphocytes Relative: 8 %
Lymphs Abs: 1.7 K/uL (ref 0.7–4.0)
Lymphs Abs: 1.5 K/uL (ref 0.7–4.0)
MCH: 30.4 pg (ref 26.0–34.0)
MCH: 30.3 pg (ref 26.0–34.0)
MCHC: 31.6 g/dL (ref 30.0–36.0)
MCHC: 32.5 g/dL (ref 30.0–36.0)
MCV: 96.5 fL (ref 80.0–100.0)
MCV: 93.3 fL (ref 80.0–100.0)
Monocytes Absolute: 0.9 K/uL (ref 0.1–1.0)
Monocytes Absolute: 0.6 K/uL (ref 0.1–1.0)
Monocytes Relative: 5 %
Monocytes Relative: 3 %
Neutro Abs: 16.8 K/uL — ABNORMAL HIGH (ref 1.7–7.7)
Neutro Abs: 15.7 K/uL — ABNORMAL HIGH (ref 1.7–7.7)
Neutrophils Relative %: 84 %
Neutrophils Relative %: 86 %
Platelets: 255 K/uL (ref 150–400)
Platelets: 281 K/uL (ref 150–400)
RBC: 3.12 MIL/uL — ABNORMAL LOW (ref 3.87–5.11)
RBC: 3.3 MIL/uL — ABNORMAL LOW (ref 3.87–5.11)
RDW: 13.2 % (ref 11.5–15.5)
RDW: 12.9 % (ref 11.5–15.5)
WBC: 20.1 K/uL — ABNORMAL HIGH (ref 4.0–10.5)
WBC: 18.4 K/uL — ABNORMAL HIGH (ref 4.0–10.5)
nRBC: 0.1 % (ref 0.0–0.2)
nRBC: 0 % (ref 0.0–0.2)

## 2023-11-28 LAB — FERRITIN: Ferritin: 76 ng/mL (ref 11–307)

## 2023-11-28 LAB — IRON AND TIBC
Iron: 44 ug/dL (ref 28–170)
Saturation Ratios: 14 % (ref 10.4–31.8)
TIBC: 315 ug/dL (ref 250–450)
UIBC: 271 ug/dL

## 2023-11-28 LAB — COMPREHENSIVE METABOLIC PANEL WITH GFR
ALT: 12 U/L (ref 0–44)
AST: 15 U/L (ref 15–41)
Albumin: 2.7 g/dL — ABNORMAL LOW (ref 3.5–5.0)
Alkaline Phosphatase: 27 U/L — ABNORMAL LOW (ref 38–126)
Anion gap: 12 (ref 5–15)
BUN: 20 mg/dL (ref 8–23)
CO2: 21 mmol/L — ABNORMAL LOW (ref 22–32)
Calcium: 8.9 mg/dL (ref 8.9–10.3)
Chloride: 105 mmol/L (ref 98–111)
Creatinine, Ser: 0.89 mg/dL (ref 0.44–1.00)
GFR, Estimated: >60 mL/min (ref >60)
Glucose, Bld: 184 mg/dL — ABNORMAL HIGH (ref 70–99)
Potassium: 4 mmol/L (ref 3.5–5.1)
Sodium: 138 mmol/L (ref 135–145)
Total Bilirubin: 0.5 mg/dL (ref 0.0–1.2)
Total Protein: 5.5 g/dL — ABNORMAL LOW (ref 6.5–8.1)

## 2023-11-28 LAB — RESPIRATORY PANEL BY PCR

## 2023-11-28 LAB — C-REACTIVE PROTEIN: CRP: 9.6 mg/dL — ABNORMAL HIGH (ref <1.0)

## 2023-11-28 LAB — GLUCOSE, CAPILLARY
Glucose-Capillary: 191 mg/dL — ABNORMAL HIGH (ref 70–99)
Glucose-Capillary: 272 mg/dL — ABNORMAL HIGH (ref 70–99)
Glucose-Capillary: 294 mg/dL — ABNORMAL HIGH (ref 70–99)
Glucose-Capillary: 258 mg/dL — ABNORMAL HIGH (ref 70–99)

## 2023-11-28 LAB — SEDIMENTATION RATE: Sed Rate: 61 mm/h — ABNORMAL HIGH (ref 0–22)

## 2023-11-28 LAB — HEMOGLOBIN A1C
Hgb A1c MFr Bld: 10.2 % — ABNORMAL HIGH (ref 4.8–5.6)
Mean Plasma Glucose: 246 mg/dL

## 2023-11-28 LAB — LACTIC ACID, PLASMA: Lactic Acid, Venous: 1.8 mmol/L (ref 0.5–1.9)

## 2023-11-28 LAB — TSH: TSH: 0.632 u[IU]/mL (ref 0.350–4.500)

## 2023-11-28 MED ORDER — ADULT MULTIVITAMIN W/MINERALS CH
1.0000 | ORAL_TABLET | Freq: Every day | ORAL | Status: DC
  Administered 2023-11-28 – 2023-12-04 (×7): 1 via ORAL
  Filled 2023-11-28 (×7): qty 1

## 2023-11-28 MED ORDER — FLUTICASONE FUROATE-VILANTEROL 100-25 MCG/ACT IN AEPB
1.0000 | INHALATION_SPRAY | Freq: Every day | RESPIRATORY_TRACT | Status: DC
  Administered 2023-11-29 – 2023-11-30 (×2): 1 via RESPIRATORY_TRACT
  Filled 2023-11-28: qty 28

## 2023-11-28 MED ORDER — ENSURE PLUS HIGH PROTEIN PO LIQD
237.0000 mL | Freq: Two times a day (BID) | ORAL | Status: DC
  Administered 2023-11-28 – 2023-12-04 (×8): 237 mL via ORAL

## 2023-11-28 MED ORDER — SODIUM CHLORIDE 0.9 % IV SOLN
200.0000 mg | Freq: Once | INTRAVENOUS | Status: AC
  Administered 2023-11-28: 200 mg via INTRAVENOUS
  Filled 2023-11-28: qty 10

## 2023-11-28 MED ORDER — INSULIN GLARGINE-YFGN 100 UNIT/ML ~~LOC~~ SOLN
10.0000 [IU] | Freq: Every day | SUBCUTANEOUS | Status: DC
  Administered 2023-11-28 – 2023-11-29 (×2): 10 [IU] via SUBCUTANEOUS
  Filled 2023-11-28 (×3): qty 0.1

## 2023-11-28 NOTE — Consult Note (Signed)
 Reason for Consult: Abdominal pain. Referring Physician: [facility] hospital[facility].  Hannah [name] is an 89 y.o. female.  HPI: Ms. Hannah [name] is a 89 year old [place] female with multiple medical problems listed below presented to the emergency room with diffuse abdominal pain. She claims her symptoms became worse 4 days ago prompting her to come to the emergency room on 11/26/2023.  CT scan on admission revealed a small hiatal hernia with bland appearance and bandlike scarring of the right middle lobe and left lower lobe; the colon seems decompressed and there was some suggestion of long segment mucosal hyperenhancement in the transverse colon and the sigmoid colon with occasional sigmoid diverticula along with aortic atherosclerosis; her lactate levels were high at 3.4 yesterday and is 2.6 today.  She denied having any fever chills or rigors. She has had some loose stools every time she urinates but denies having multiple loose bowel movements per day.  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 On presentation to the emergency room she was noted to be tachypneic and tachycardic with elevated blood pressures creatinine was 1.14 glucose was 281 and patient was transferred to [facility] for admission. Blood cultures are pending.  Last colonoscopy done in 2005 was normal she has not had a colonoscopy colonoscopy since then. Interestingly, her daughter has Crohn's disease.   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   Spinal stenosis    Tuberculosis    1978   Weight loss, unintentional    Past Surgical History:  Procedure Laterality Date   AORTIC ARCH ANGIOGRAPHY  09/2019   BACK SURGERY     COLONOSCOPY     EYE SURGERY Bilateral    cataracts   LIPOMA EXCISION     back   LUMBAR LAMINECTOMY/DECOMPRESSION  MICRODISCECTOMY Right 04/12/2018   Procedure: Laminectomy and Foraminotomy - Lumbar four-Lumbar five - Lumbar five-Sacral one - right;  Surgeon: Joshua Alm RAMAN, MD;  Location: [facility] OR;  Service: Neurosurgery;  Laterality: Right;   LUMBAR SPINE SURGERY  05/2019   MASS EXCISION Left 04/02/2019   Procedure: EXCISION OF LIPOMATOUS MASS OF THE LEFT BACK;  Surgeon: Gladis Cough, MD;  Location: [facility];  Service: General;  Laterality: Left;   OTHER SURGICAL HISTORY     squamous cell removal;skin graft   RIGHT/LEFT HEART CATH AND CORONARY ANGIOGRAPHY N/A 09/18/2019   Procedure: RIGHT/LEFT HEART CATH AND CORONARY ANGIOGRAPHY;  Surgeon: Ladona Heinz, MD;  Location: [facility];  Service: Cardiovascular;  Laterality: N/A;   TOOTH EXTRACTION     all pulled   Family History  Problem Relation Age of Onset   Arthritis Mother        Late 90s   Heart attack Father        Deceased, 40   Heart disease Brother    Hypertension Other    Hyperlipidemia Other    Diabetes type II Sister    Diabetes Brother    Social History:  reports that she has never smoked. She has never used smokeless tobacco. She reports that she does not drink alcohol and does not use drugs.  Allergies:  Allergies  Allergen Reactions   Penicillins Rash    1980's   Sulfa Antibiotics Rash   Medications: I have reviewed the patient's  current medications. Prior to Admission:  Medications Prior to Admission  Medication Sig Dispense Refill Last Dose/Taking   acetaminophen  (TYLENOL ) 325 MG tablet Take 650 mg by mouth every 6 (six) hours as needed for mild pain.    Unknown   albuterol  (VENTOLIN  HFA) 108 (90 Base) MCG/ACT inhaler Inhale 2 puffs into the lungs every 6 (six) hours as needed for wheezing or shortness of breath. 18 g 3 Unknown   Cholecalciferol  (VITAMIN D -3) 125 MCG (5000 UT) TABS Take 5,000 Units by mouth daily.   Past Week   Coenzyme Q10 (CO Q 10 PO) Take 1 capsule by mouth daily.   Past Week    Continuous Glucose Sensor (FREESTYLE LIBRE 14 DAY SENSOR) MISC Inject 1 Device into the skin every 14 (fourteen) days.   Past Month   Cyanocobalamin  (B-12 SL) Place 1 tablet under the tongue daily.   Past Week   diclofenac  sodium (VOLTAREN ) 1 % GEL Apply 2 g topically 3 (three) times daily as needed (joint pain- affected areas).   Taking As Needed   donepezil  (ARICEPT ) 5 MG tablet Take 5 mg by mouth at bedtime.   11/25/2023   ezetimibe -simvastatin  (VYTORIN ) 10-20 MG per tablet Take 1 tablet by mouth at bedtime.   11/25/2023   HYDROcodone -acetaminophen  (NORCO/VICODIN) 5-325 MG tablet Take 1 tablet by mouth See admin instructions. Take 1 tablet by mouth with lunch and in the evening   11/26/2023   insulin  glargine (LANTUS  SOLOSTAR) 100 UNIT/ML Solostar Pen Inject 20 Units into the skin at bedtime.   11/25/2023   insulin  lispro (HUMALOG) 100 UNIT/ML KwikPen Inject 2-12 Units into the skin See admin instructions. Inject 2-12 units into the skin three times a day with meals, per sliding scale   11/26/2023   JANUVIA 50 MG tablet Take 50 mg by mouth daily.   11/26/2023   latanoprost  (XALATAN ) 0.005 % ophthalmic solution Place 1 drop into both eyes at bedtime.   11/25/2023   levothyroxine  (SYNTHROID ) 50 MCG tablet Take 1 tablet (50 mcg total) by mouth See admin instructions. Take 25mcg alternating 50mcg every other day. (Patient taking differently: Take 25-50 mcg by mouth See admin instructions. Take 25 mcg by mouth 30 minutes before breakfast ALTERNATING WITH 50 mcg every OTHER morning) 30 tablet 0 11/26/2023   lidocaine  (XYLOCAINE ) 2 % solution Use as directed 3 mLs in the mouth or throat daily as needed for mouth pain.   Unknown   megestrol  (MEGACE ) 400 MG/10ML suspension Take 600 mg by mouth daily before supper.   11/25/2023   metoprolol  tartrate (LOPRESSOR ) 25 MG tablet Take 1 tablet (25 mg total) by mouth 2 (two) times daily. (Patient taking differently: Take 25 mg by mouth in the morning and at bedtime.) 60  tablet 0 11/26/2023   MYRBETRIQ  25 MG TB24 tablet Take 25 mg by mouth in the morning.   11/26/2023   omeprazole (PRILOSEC) 20 MG capsule Take 20 mg by mouth every other day.   Past Week   ondansetron  (ZOFRAN ) 4 MG tablet Take 1 tablet (4 mg total) by mouth every 8 (eight) hours as needed for nausea or vomiting. 12 tablet 0 Unknown   SYMBICORT  160-4.5 MCG/ACT inhaler Inhale 2 puffs by mouth twice daily (Patient taking differently: Inhale 2 puffs into the lungs in the morning and at bedtime.) 11 g 0 Unknown   traMADol  (ULTRAM ) 50 MG tablet Take 1 tablet (50 mg total) by mouth every 6 (six) hours as needed. (Patient not taking: Reported on 11/28/2023) 15  tablet 0 Not Taking   Scheduled:  enoxaparin  (LOVENOX ) injection  30 mg Subcutaneous Q24H   feeding supplement  237 mL Oral BID BM   insulin  aspart  0-5 Units Subcutaneous QHS   insulin  aspart  0-6 Units Subcutaneous TID WC   latanoprost   1 drop Both Eyes QHS   levothyroxine   25 mcg Oral Q48H   levothyroxine   50 mcg Oral Q48H   metoprolol  tartrate  25 mg Oral BID   mirabegron  ER  25 mg Oral Daily   multivitamin with minerals  1 tablet Oral Daily   predniSONE   40 mg Oral Q breakfast   sodium chloride  flush  3 mL Intravenous Q12H   Continuous:  cefTRIAXone  (ROCEPHIN )  IV 1 g (11/27/23 2047)   metronidazole  500 mg (11/28/23 0858)   PRN:acetaminophen  **OR** acetaminophen , albuterol , fentaNYL  (SUBLIMAZE ) injection, ondansetron  **OR** ondansetron  (ZOFRAN ) IV, oxyCODONE   Results for orders placed or performed during the hospital encounter of 11/26/23 (from the past 48 hours)  Comprehensive metabolic panel     Status: Abnormal   Collection Time: 11/26/23  7:37 PM  Result Value Ref Range   Sodium 141 135 - 145 mmol/L   Potassium 4.3 3.5 - 5.1 mmol/L    Comment: HEMOLYSIS AT THIS LEVEL MAY AFFECT RESULT   Chloride 103 98 - 111 mmol/L   CO2 23 22 - 32 mmol/L   Glucose, Bld 281 (H) 70 - 99 mg/dL    Comment: Glucose reference range applies only to  samples taken after fasting for at least 8 hours.   BUN 18 8 - 23 mg/dL   Creatinine, Ser 8.85 (H) 0.44 - 1.00 mg/dL   Calcium  10.4 (H) 8.9 - 10.3 mg/dL   Total Protein 7.5 6.5 - 8.1 g/dL   Albumin 4.2 3.5 - 5.0 g/dL   AST 21 15 - 41 U/L   ALT 14 0 - 44 U/L   Alkaline Phosphatase 49 38 - 126 U/L   Total Bilirubin 0.5 0.0 - 1.2 mg/dL   GFR, Estimated 46 (L) >60 mL/min    Comment: (NOTE) Calculated using the CKD-EPI Creatinine Equation (2021)    Anion gap 15 5 - 15    Comment: Performed at [facility], [address], [place], [place] [address]  Lipase, blood     Status: None   Collection Time: 11/26/23  7:37 PM  Result Value Ref Range   Lipase 17 11 - 51 U/L    Comment: Performed at [facility], [address] [place], [place] [address]  CBC with Differential     Status: Abnormal   Collection Time: 11/26/23  7:37 PM  Result Value Ref Range   WBC 15.8 (H) 4.0 - 10.5 K/uL   RBC 4.05 3.87 - 5.11 MIL/uL   Hemoglobin 12.3 12.0 - 15.0 g/dL   HCT 61.6 63.9 - 53.9 %   MCV 94.6 80.0 - 100.0 fL   MCH 30.4 26.0 - 34.0 pg   MCHC 32.1 30.0 - 36.0 g/dL   RDW 86.7 88.4 - 84.4 %   Platelets 262 150 - 400 K/uL   nRBC 0.1 0.0 - 0.2 %   Neutrophils Relative % 82 %   Neutro Abs 13.1 (H) 1.7 - 7.7 K/uL   Lymphocytes Relative 11 %   Lymphs Abs 1.7 0.7 - 4.0 K/uL   Monocytes Relative 3 %   Monocytes Absolute 0.4 0.1 - 1.0 K/uL   Eosinophils Relative 2 %   Eosinophils Absolute 0.3 0.0 - 0.5 K/uL  Basophils Relative 0 %   Basophils Absolute 0.1 0.0 - 0.1 K/uL   Immature Granulocytes 2 %   Abs Immature Granulocytes 0.29 (H) 0.00 - 0.07 K/uL    Comment: Performed at [facility], [address], [place], [place] [address]  Troponin T, High Sensitivity     Status: Abnormal   Collection Time: 11/26/23  7:37 PM  Result Value Ref Range   Troponin T High Sensitivity 28 (H) <19 ng/L    Comment: (NOTE) Biotin concentrations > 1000 ng/mL  falsely decrease TnT results.  Serial cardiac troponin measurements are suggested.  Refer to the Links section for chest pain algorithms and additional  guidance. Performed at [facility], [address] [place], [place] [address]   D-dimer, quantitative     Status: Abnormal   Collection Time: 11/26/23  7:37 PM  Result Value Ref Range   D-Dimer, Quant 2.14 (H) 0.00 - 0.50 ug/mL-FEU    Comment: (NOTE) At the manufacturer cut-off value of 0.5 g/mL FEU, this assay has a negative predictive value of 95-100%.This assay is intended for use in conjunction with a clinical pretest probability (PTP) assessment model to exclude pulmonary embolism (PE) and deep venous thrombosis (DVT) in outpatients suspected of PE or DVT. Results should be correlated with clinical presentation. Performed at [facility], [address] [place], [place] [address]   Resp panel by RT-PCR (RSV, Flu A&B, Covid) Anterior Nasal Swab     Status: None   Collection Time: 11/26/23  7:37 PM   Specimen: Anterior Nasal Swab  Result Value Ref Range   SARS Coronavirus 2 by RT PCR NEGATIVE NEGATIVE    Comment: (NOTE) SARS-CoV-2 target nucleic acids are NOT DETECTED.  The SARS-CoV-2 RNA is generally detectable in upper respiratory specimens during the acute phase of infection. The lowest concentration of SARS-CoV-2 viral copies this assay can detect is 138 copies/mL. A negative result does not preclude SARS-Cov-2 infection and should not be used as the sole basis for treatment or other patient management decisions. A negative result may occur with  improper specimen collection/handling, submission of specimen other than nasopharyngeal swab, presence of viral mutation(s) within the areas targeted by this assay, and inadequate number of viral copies(<138 copies/mL). A negative result must be combined with clinical observations, patient history, and epidemiological information.  The expected result is Negative.  Fact Sheet for Patients:  BloggerCourse.com  Fact Sheet for Healthcare Providers:  SeriousBroker.it  This test is no t yet approved or cleared by the [place]  FDA and  has been authorized for detection and/or diagnosis of SARS-CoV-2 by FDA under an Emergency Use Authorization (EUA). This EUA will remain  in effect (meaning this test can be used) for the duration of the COVID-19 declaration under Section 564(b)(1) of the Act, 21 U.S.C.section 360bbb-3(b)(1), unless the authorization is terminated  or revoked sooner.       Influenza A by PCR NEGATIVE NEGATIVE   Influenza B by PCR NEGATIVE NEGATIVE    Comment: (NOTE) The Xpert Xpress SARS-CoV-2/FLU/RSV plus assay is intended as an aid in the diagnosis of influenza from Nasopharyngeal swab specimens and should not be used as a sole basis for treatment. Nasal washings and aspirates are unacceptable for Xpert Xpress SARS-CoV-2/FLU/RSV testing.  Fact Sheet for Patients: BloggerCourse.com  Fact Sheet for Healthcare Providers: SeriousBroker.it  This test is not yet approved or cleared by the [place]  FDA and has been authorized for detection and/or diagnosis of SARS-CoV-2 by FDA under an  Emergency Use Authorization (EUA). This EUA will remain in effect (meaning this test can be used) for the duration of the COVID-19 declaration under Section 564(b)(1) of the Act, 21 U.S.C. section 360bbb-3(b)(1), unless the authorization is terminated or revoked.     Resp Syncytial Virus by PCR NEGATIVE NEGATIVE    Comment: (NOTE) Fact Sheet for Patients: BloggerCourse.com  Fact Sheet for Healthcare Providers: SeriousBroker.it  This test is not yet approved or cleared by the [place]  FDA and has been authorized for detection and/or diagnosis of  SARS-CoV-2 by FDA under an Emergency Use Authorization (EUA). This EUA will remain in effect (meaning this test can be used) for the duration of the COVID-19 declaration under Section 564(b)(1) of the Act, 21 U.S.C. section 360bbb-3(b)(1), unless the authorization is terminated or revoked.  Performed at [facility], [address], [place], [place] [address]   Protime-INR     Status: None   Collection Time: 11/26/23  7:48 PM  Result Value Ref Range   Prothrombin Time 14.4 11.4 - 15.2 seconds   INR 1.1 0.8 - 1.2    Comment: (NOTE) INR goal varies based on device and disease states. Performed at [facility], [address] [place], [place] [address]   Blood Culture (routine x 2)     Status: None (Preliminary result)   Collection Time: 11/26/23  7:48 PM   Specimen: BLOOD  Result Value Ref Range   Specimen Description      BLOOD RIGHT ANTECUBITAL Performed at [facility], [address] [place], [place] [address]    Special Requests      BOTTLES DRAWN AEROBIC AND ANAEROBIC Blood Culture results may not be optimal due to an inadequate volume of blood received in culture bottles   Culture      NO GROWTH 2 DAYS Performed at [facility], [address] [place], [place] [address]    Report Status PENDING   Lactic acid, plasma     Status: Abnormal   Collection Time: 11/26/23  8:09 PM  Result Value Ref Range   Lactic Acid, Venous 2.6 (HH) 0.5 - 1.9 mmol/L    Comment: Critical Value, Read Back and verified with [facility] 11/26/23 AT 2044 HS Performed at [facility], [address], [place], [place] [address]   Blood Culture (routine x 2)     Status: None (Preliminary result)   Collection Time: 11/26/23  8:09 PM   Specimen: BLOOD  Result Value Ref Range   Specimen Description      BLOOD LEFT ANTECUBITAL Performed at [facility], [address] [place], [place]  [address]    Special Requests      BOTTLES DRAWN AEROBIC AND ANAEROBIC Blood Culture results may not be optimal due to an inadequate volume of blood received in culture bottles Performed at [facility], [address], [place], [place] [address]    Culture      NO GROWTH 2 DAYS Performed at [facility], [address] [place], [place] [address]    Report Status PENDING   Urinalysis, w/ Reflex to Culture (Infection Suspected) -Urine, Clean Catch     Status: Abnormal   Collection Time: 11/26/23  8:43 PM  Result Value Ref Range   Specimen Source URINE, CLEAN CATCH    Color, Urine YELLOW YELLOW   APPearance CLEAR CLEAR   Specific Gravity, Urine 1.039 (H) 1.005 - 1.030   pH 6.0 5.0 - 8.0  Glucose, UA 100 (A) NEGATIVE mg/dL   Hgb urine dipstick NEGATIVE NEGATIVE   Bilirubin Urine NEGATIVE NEGATIVE   Ketones, ur NEGATIVE NEGATIVE mg/dL   Protein, ur 30 (A) NEGATIVE mg/dL   Nitrite NEGATIVE NEGATIVE   Leukocytes,Ua TRACE (A) NEGATIVE   RBC / HPF 0-5 0 - 5 RBC/hpf   WBC, UA 6-10 0 - 5 WBC/hpf    Comment:        Reflex urine culture not performed if WBC <=10, OR if Squamous epithelial cells >5. If Squamous epithelial cells >5 suggest recollection.    Bacteria, UA RARE (A) NONE SEEN   Squamous Epithelial / HPF 0-5 0 - 5 /HPF   Mucus PRESENT     Comment: Performed at [facility], [address] [place], [place] [address]  Lactic acid, plasma     Status: Abnormal   Collection Time: 11/26/23  9:53 PM  Result Value Ref Range   Lactic Acid, Venous 3.4 (HH) 0.5 - 1.9 mmol/L    Comment: Critical value noted. Value is consistent with previously reported and called value  Performed at [facility], [address] [place], [place] [address]   Troponin T, High Sensitivity     Status: Abnormal   Collection Time: 11/26/23  9:53 PM  Result Value Ref Range   Troponin T High Sensitivity 27 (H) <19 ng/L    Comment:  (NOTE) Biotin concentrations > 1000 ng/mL falsely decrease TnT results.  Serial cardiac troponin measurements are suggested.  Refer to the Links section for chest pain algorithms and additional  guidance. Performed at [facility], [address] [place], [place] [address]   Glucose, capillary     Status: Abnormal   Collection Time: 11/26/23 11:52 PM  Result Value Ref Range   Glucose-Capillary 331 (H) 70 - 99 mg/dL    Comment: Glucose reference range applies only to samples taken after fasting for at least 8 hours.  Lactic acid, plasma     Status: Abnormal   Collection Time: 11/27/23  2:59 AM  Result Value Ref Range   Lactic Acid, Venous 2.6 (HH) 0.5 - 1.9 mmol/L    Comment: CRITICAL RESULT CALLED TO, READ BACK BY AND VERIFIED WITH KYM OLSZEWSKI, RN [phone] 11/27/23 BY ALONSO CORDS Performed at [facility], [address] [place], [place] [address]   Hemoglobin A1c     Status: Abnormal   Collection Time: 11/27/23  2:59 AM  Result Value Ref Range   Hgb A1c MFr Bld 10.2 (H) 4.8 - 5.6 %    Comment: (NOTE)         Prediabetes: 5.7 - 6.4         Diabetes: >6.4         Glycemic control for adults with diabetes: <7.0    Mean Plasma Glucose 246 mg/dL    Comment: (NOTE) Performed At: [facility] [address] [place], [place] [id number] Jennette Shorter MD Ey:[id number]   Basic metabolic panel     Status: Abnormal   Collection Time: 11/27/23  2:59 AM  Result Value Ref Range   Sodium 138 135 - 145 mmol/L   Potassium 5.1 3.5 - 5.1 mmol/L   Chloride 107 98 - 111 mmol/L   CO2 22 22 - 32 mmol/L   Glucose, Bld 369 (H) 70 - 99 mg/dL    Comment: Glucose reference range applies only to samples taken after fasting for at least 8 hours.   BUN 17 8 - 23 mg/dL  Creatinine, Ser 0.96 0.44 - 1.00 mg/dL   Calcium  8.4 (L) 8.9 - 10.3 mg/dL   GFR, Estimated 57 (L) >60 mL/min    Comment: (NOTE) Calculated using the CKD-EPI Creatinine Equation (2021)     Anion gap 9 5 - 15    Comment: Performed at [facility], [address] [place], [place] [address]  CBC     Status: Abnormal   Collection Time: 11/27/23  2:59 AM  Result Value Ref Range   WBC 18.5 (H) 4.0 - 10.5 K/uL   RBC 3.30 (L) 3.87 - 5.11 MIL/uL   Hemoglobin 10.0 (L) 12.0 - 15.0 g/dL   HCT 67.7 (L) 63.9 - 53.9 %   MCV 97.6 80.0 - 100.0 fL   MCH 30.3 26.0 - 34.0 pg   MCHC 31.1 30.0 - 36.0 g/dL   RDW 86.8 88.4 - 84.4 %   Platelets 234 150 - 400 K/uL   nRBC 0.0 0.0 - 0.2 %    Comment: Performed at [facility], [address], [place], [place] [address]  Glucose, capillary     Status: Abnormal   Collection Time: 11/27/23  3:57 AM  Result Value Ref Range   Glucose-Capillary 329 (H) 70 - 99 mg/dL    Comment: Glucose reference range applies only to samples taken after fasting for at least 8 hours.  Glucose, capillary     Status: Abnormal   Collection Time: 11/27/23  8:03 AM  Result Value Ref Range   Glucose-Capillary 353 (H) 70 - 99 mg/dL    Comment: Glucose reference range applies only to samples taken after fasting for at least 8 hours.  Glucose, capillary     Status: Abnormal   Collection Time: 11/27/23 11:37 AM  Result Value Ref Range   Glucose-Capillary 256 (H) 70 - 99 mg/dL    Comment: Glucose reference range applies only to samples taken after fasting for at least 8 hours.  Glucose, capillary     Status: Abnormal   Collection Time: 11/27/23  4:25 PM  Result Value Ref Range   Glucose-Capillary 196 (H) 70 - 99 mg/dL    Comment: Glucose reference range applies only to samples taken after fasting for at least 8 hours.  Glucose, capillary     Status: Abnormal   Collection Time: 11/27/23  8:50 PM  Result Value Ref Range   Glucose-Capillary 203 (H) 70 - 99 mg/dL    Comment: Glucose reference range applies only to samples taken after fasting for at least 8 hours.  Respiratory (~20 pathogens) panel by PCR     Status: None   Collection  Time: 11/27/23 10:40 PM   Specimen: Nasopharyngeal Swab; Respiratory  Result Value Ref Range   Adenovirus NOT DETECTED NOT DETECTED   Coronavirus 229E NOT DETECTED NOT DETECTED    Comment: (NOTE) The Coronavirus on the Respiratory Panel, DOES NOT test for the novel  Coronavirus (2019 nCoV)    Coronavirus HKU1 NOT DETECTED NOT DETECTED   Coronavirus NL63 NOT DETECTED NOT DETECTED   Coronavirus OC43 NOT DETECTED NOT DETECTED   Metapneumovirus NOT DETECTED NOT DETECTED   Rhinovirus / Enterovirus NOT DETECTED NOT DETECTED   Influenza A NOT DETECTED NOT DETECTED   Influenza B NOT DETECTED NOT DETECTED   Parainfluenza Virus 1 NOT DETECTED NOT DETECTED   Parainfluenza Virus 2 NOT DETECTED NOT DETECTED   Parainfluenza Virus 3 NOT DETECTED NOT DETECTED   Parainfluenza Virus 4 NOT DETECTED NOT DETECTED   Respiratory Syncytial Virus  NOT DETECTED NOT DETECTED   Bordetella pertussis NOT DETECTED NOT DETECTED   Bordetella Parapertussis NOT DETECTED NOT DETECTED   Chlamydophila pneumoniae NOT DETECTED NOT DETECTED   Mycoplasma pneumoniae NOT DETECTED NOT DETECTED    Comment: Performed at [facility], [address], [place], [place] [address]  TSH     Status: None   Collection Time: 11/28/23  4:55 AM  Result Value Ref Range   TSH 0.632 0.350 - 4.500 uIU/mL    Comment: Performed by a 3rd Generation assay with a functional sensitivity of <=0.01 uIU/mL. Performed at [facility], [address] [place], [place] [address]   Comprehensive metabolic panel with GFR     Status: Abnormal   Collection Time: 11/28/23  4:55 AM  Result Value Ref Range   Sodium 138 135 - 145 mmol/L   Potassium 4.0 3.5 - 5.1 mmol/L   Chloride 105 98 - 111 mmol/L   CO2 21 (L) 22 - 32 mmol/L   Glucose, Bld 184 (H) 70 - 99 mg/dL    Comment: Glucose reference range applies only to samples taken after fasting for at least 8 hours.   BUN 20 8 - 23 mg/dL   Creatinine, Ser 9.10 0.44 - 1.00 mg/dL    Calcium  8.9 8.9 - 10.3 mg/dL   Total Protein 5.5 (L) 6.5 - 8.1 g/dL   Albumin 2.7 (L) 3.5 - 5.0 g/dL   AST 15 15 - 41 U/L   ALT 12 0 - 44 U/L   Alkaline Phosphatase 27 (L) 38 - 126 U/L   Total Bilirubin 0.5 0.0 - 1.2 mg/dL   GFR, Estimated >39 >39 mL/min    Comment: (NOTE) Calculated using the CKD-EPI Creatinine Equation (2021)    Anion gap 12 5 - 15    Comment: Performed at [facility], [address], [place], [place] [address]  Iron  and TIBC     Status: None   Collection Time: 11/28/23  4:55 AM  Result Value Ref Range   Iron  44 28 - 170 ug/dL   TIBC 684 749 - 549 ug/dL   Saturation Ratios 14 10.4 - 31.8 %   UIBC 271 ug/dL    Comment: Performed at [facility], [address], [place], [place] [address]  Ferritin     Status: None   Collection Time: 11/28/23  4:55 AM  Result Value Ref Range   Ferritin 76 11 - 307 ng/mL    Comment: Performed at [facility], [address] [place], [place] [address]  CBC with Differential/Platelet     Status: Abnormal   Collection Time: 11/28/23  4:55 AM  Result Value Ref Range   WBC 20.1 (H) 4.0 - 10.5 K/uL   RBC 3.12 (L) 3.87 - 5.11 MIL/uL   Hemoglobin 9.5 (L) 12.0 - 15.0 g/dL   HCT 69.8 (L) 63.9 - 53.9 %   MCV 96.5 80.0 - 100.0 fL   MCH 30.4 26.0 - 34.0 pg   MCHC 31.6 30.0 - 36.0 g/dL   RDW 86.7 88.4 - 84.4 %   Platelets 255 150 - 400 K/uL   nRBC 0.1 0.0 - 0.2 %   Neutrophils Relative % 84 %   Neutro Abs 16.8 (H) 1.7 - 7.7 K/uL   Lymphocytes Relative 8 %   Lymphs Abs 1.7 0.7 - 4.0 K/uL   Monocytes Relative 5 %   Monocytes Absolute 0.9 0.1 - 1.0 K/uL   Eosinophils Relative 0 %  Eosinophils Absolute 0.0 0.0 - 0.5 K/uL   Basophils Relative 0 %   Basophils Absolute 0.0 0.0 - 0.1 K/uL   Immature Granulocytes 3 %   Abs Immature Granulocytes 0.64 (H) 0.00 - 0.07 K/uL    Comment: Performed at [facility], [address], [place], [place] [address]   C-reactive protein     Status: Abnormal   Collection Time: 11/28/23  4:55 AM  Result Value Ref Range   CRP 9.6 (H) <1.0 mg/dL    Comment: Performed at [facility], [address], [place], [place] [address]  Glucose, capillary     Status: Abnormal   Collection Time: 11/28/23  7:25 AM  Result Value Ref Range   Glucose-Capillary 191 (H) 70 - 99 mg/dL    Comment: Glucose reference range applies only to samples taken after fasting for at least 8 hours.  Lactic acid, plasma     Status: None   Collection Time: 11/28/23  8:42 AM  Result Value Ref Range   Lactic Acid, Venous 1.8 0.5 - 1.9 mmol/L    Comment: Performed at [facility], [address] [place], [place] [address]  Glucose, capillary     Status: Abnormal   Collection Time: 11/28/23 11:55 AM  Result Value Ref Range   Glucose-Capillary 272 (H) 70 - 99 mg/dL    Comment: Glucose reference range applies only to samples taken after fasting for at least 8 hours.    CT Angio Chest PE W and/or Wo Contrast Result Date: 11/26/2023 CLINICAL DATA:  PE suspected, sepsis EXAM: CT ANGIOGRAPHY CHEST CT ABDOMEN AND PELVIS WITH CONTRAST TECHNIQUE: Multidetector CT imaging of the chest was performed using the standard protocol during bolus administration of intravenous contrast. Multiplanar CT image reconstructions and MIPs were obtained to evaluate the vascular anatomy. Multidetector CT imaging of the abdomen and pelvis was performed using the standard protocol during bolus administration of intravenous contrast. RADIATION DOSE REDUCTION: This exam was performed according to the departmental dose-optimization program which includes automated exposure control, adjustment of the mA and/or kV according to patient size and/or use of iterative reconstruction technique. CONTRAST:  65mL OMNIPAQUE  IOHEXOL  350 MG/ML SOLN COMPARISON:  02/12/2023 FINDINGS: CT CHEST ANGIOGRAM FINDINGS Cardiovascular: Satisfactory opacification of the  pulmonary arteries to the segmental level. No evidence of pulmonary embolism. Normal heart size. No pericardial effusion. Aortic atherosclerosis. Mediastinum/Nodes: No enlarged mediastinal, hilar, or axillary lymph nodes. Small hiatal hernia. Thyroid  gland, trachea, and esophagus demonstrate no significant findings. Lungs/Pleura: Benjamine appearing bandlike scarring of the right middle lobe and left lower lobe (series 4, image 71). No pleural effusion or pneumothorax. Musculoskeletal: No chest wall abnormality. No acute osseous findings. Review of the MIP images confirms the above findings. CT ABDOMEN PELVIS FINDINGS Hepatobiliary: No solid liver abnormality is seen. No gallstones, gallbladder wall thickening, or biliary dilatation. Pancreas: Unremarkable. No pancreatic ductal dilatation or surrounding inflammatory changes. Spleen: Normal in size without significant abnormality. Adrenals/Urinary Tract: Adrenal glands are unremarkable. Kidneys are normal, without renal calculi, solid lesion, or hydronephrosis. Bladder is unremarkable. Stomach/Bowel: Stomach is within normal limits. Appendix appears normal. The colon is decompressed. There is some suggestion of long segment wall thickening and mucosal hyperenhancement, most conspicuously in the transverse colon (series 9, image 57), and in the sigmoid colon (series 6, image 47). Occasional sigmoid diverticula. Vascular/Lymphatic: Aortic atherosclerosis. No enlarged abdominal or pelvic lymph nodes. Reproductive: No mass or other significant abnormality. Other: No abdominal wall hernia or abnormality. No ascites. Musculoskeletal:  No acute or significant osseous findings. Review of the MIP images confirms the above findings. IMPRESSION: 1. Negative examination for pulmonary embolism. 2. The colon is decompressed. There is some suggestion of long segment wall thickening and mucosal hyperenhancement, most conspicuously in the transverse colon, and in the sigmoid colon.  Correlate for signs and symptoms of colitis. 3. Small hiatal hernia. Aortic Atherosclerosis (ICD10-I70.0). Electronically Signed   By: Marolyn JONETTA Jaksch M.D.   On: 11/26/2023 20:48   CT ABDOMEN PELVIS W CONTRAST Result Date: 11/26/2023 CLINICAL DATA:  PE suspected, sepsis EXAM: CT ANGIOGRAPHY CHEST CT ABDOMEN AND PELVIS WITH CONTRAST TECHNIQUE: Multidetector CT imaging of the chest was performed using the standard protocol during bolus administration of intravenous contrast. Multiplanar CT image reconstructions and MIPs were obtained to evaluate the vascular anatomy. Multidetector CT imaging of the abdomen and pelvis was performed using the standard protocol during bolus administration of intravenous contrast. RADIATION DOSE REDUCTION: This exam was performed according to the departmental dose-optimization program which includes automated exposure control, adjustment of the mA and/or kV according to patient size and/or use of iterative reconstruction technique. CONTRAST:  65mL OMNIPAQUE  IOHEXOL  350 MG/ML SOLN COMPARISON:  02/12/2023 FINDINGS: CT CHEST ANGIOGRAM FINDINGS Cardiovascular: Satisfactory opacification of the pulmonary arteries to the segmental level. No evidence of pulmonary embolism. Normal heart size. No pericardial effusion. Aortic atherosclerosis. Mediastinum/Nodes: No enlarged mediastinal, hilar, or axillary lymph nodes. Small hiatal hernia. Thyroid  gland, trachea, and esophagus demonstrate no significant findings. Lungs/Pleura: Benjamine appearing bandlike scarring of the right middle lobe and left lower lobe (series 4, image 71). No pleural effusion or pneumothorax. Musculoskeletal: No chest wall abnormality. No acute osseous findings. Review of the MIP images confirms the above findings. CT ABDOMEN PELVIS FINDINGS Hepatobiliary: No solid liver abnormality is seen. No gallstones, gallbladder wall thickening, or biliary dilatation. Pancreas: Unremarkable. No pancreatic ductal dilatation or surrounding  inflammatory changes. Spleen: Normal in size without significant abnormality. Adrenals/Urinary Tract: Adrenal glands are unremarkable. Kidneys are normal, without renal calculi, solid lesion, or hydronephrosis. Bladder is unremarkable. Stomach/Bowel: Stomach is within normal limits. Appendix appears normal. The colon is decompressed. There is some suggestion of long segment wall thickening and mucosal hyperenhancement, most conspicuously in the transverse colon (series 9, image 57), and in the sigmoid colon (series 6, image 47). Occasional sigmoid diverticula. Vascular/Lymphatic: Aortic atherosclerosis. No enlarged abdominal or pelvic lymph nodes. Reproductive: No mass or other significant abnormality. Other: No abdominal wall hernia or abnormality. No ascites. Musculoskeletal: No acute or significant osseous findings. Review of the MIP images confirms the above findings. IMPRESSION: 1. Negative examination for pulmonary embolism. 2. The colon is decompressed. There is some suggestion of long segment wall thickening and mucosal hyperenhancement, most conspicuously in the transverse colon, and in the sigmoid colon. Correlate for signs and symptoms of colitis. 3. Small hiatal hernia. Aortic Atherosclerosis (ICD10-I70.0). Electronically Signed   By: Marolyn JONETTA Jaksch M.D.   On: 11/26/2023 20:48   DG Chest Port 1 View Result Date: 11/26/2023 CLINICAL DATA:  Chest pain EXAM: PORTABLE CHEST 1 VIEW COMPARISON:  06/12/2021 FINDINGS: Stable pulmonary hyperinflation in keeping with changes of underlying COPD. New nodular opacity noted at the left peripheral lung base, indeterminate. Lungs are otherwise clear. No pneumothorax or pleural effusion. Cardiac size within normal limits. No acute bone abnormality. IMPRESSION: 1. COPD. 2. New nodular opacity at the left peripheral lung base, indeterminate. Standard two view chest radiograph would be helpful to exclude a imaging artifact after the patient's acute issues have resolved.  Electronically  Signed   By: Dorethia Molt M.D.   On: 11/26/2023 19:55    Review of Systems  Constitutional:  Positive for activity change, appetite change, fatigue and unexpected weight change. Negative for chills, diaphoresis and fever.  HENT: Negative.    Respiratory: Negative.    Cardiovascular: Negative.   Gastrointestinal:  Positive for abdominal pain and diarrhea. Negative for anal bleeding, blood in stool, constipation, nausea, rectal pain and vomiting.  Endocrine: Negative.   Genitourinary: Negative.   Musculoskeletal:  Positive for arthralgias.  Skin: Negative.   Allergic/Immunologic: Negative.   Neurological: Negative.   Hematological: Negative.   Psychiatric/Behavioral: Negative.     Blood pressure (!) 169/67, pulse 68, temperature 98.3 F (36.8 C), temperature source Oral, resp. rate 20, height 5' 2 (1.575 m), weight 44.8 kg, SpO2 100%. Physical Exam Constitutional:      General: She is not in acute distress.    Appearance: She is not toxic-appearing.  HENT:     Head: Normocephalic and atraumatic.     Mouth/Throat:     Pharynx: Oropharynx is clear.  Eyes:     Extraocular Movements: Extraocular movements intact.     Pupils: Pupils are equal, round, and reactive to light.  Cardiovascular:     Rate and Rhythm: Normal rate and regular rhythm.  Abdominal:     General: Abdomen is flat. There is no distension.     Palpations: Abdomen is soft.     Tenderness: There is abdominal tenderness in the periumbilical area and left lower quadrant.  Skin:    General: Skin is warm and dry.  Neurological:     Mental Status: She is alert and oriented to person, place, and time.  Psychiatric:        Mood and Affect: Mood normal.        Behavior: Behavior normal.   Assessment/Plan: 1) Acute colitis noted on CT scan with abdominal pain and diarrhea-stool studies are pending agree with IV hydration empiric antibiotics. If the stool studies are negative a colonoscopy will be  done. 2) Active asthma with acute exacerbation on steroids and short acting bronchodilators 3) Type 2 diabetes mellitus 4) Hypertension 5) Chronic kidney disease stage IIIa 6) Hypothyroidism 7) Unintentional weight loss  Renaye Sous 11/28/2023, 1:35 PM

## 2023-11-28 NOTE — Progress Notes (Signed)
 Progress Note   Patient: Hannah [name] FMW:[id number] DOB: 03-07-1933 DOA: 11/26/2023     2 DOS: the patient was seen and examined on 11/28/2023   Brief hospital course: Per HPI:  Hannah [name] is an 89 y.o. female with medical history significant for buccal cancer status postresection and radiation, hypertension, hypothyroidism, type 2 diabetes mellitus, chronic lumbar radiculopathy, asthma, and CKD 3A who presents with abdominal pain.   Patient complains of pain across her upper abdomen.  She also reports experiencing diarrhea every time she sits down to urinate.  She denies vomiting.  She also notes feeling short of breath recently but denies any significant cough.  Assessment and Plan: Possible Colitis Presented with abdominal pain.  Reportedly with no diarrhea though loose stools with urination, but no multiple bowel movements a day.  Found to have leukocytosis and elevated lactate on labs and CT abdomen findings concerning for colitis.  Patient had a stool. -Continue empiric antibiotics for now -Follow-up GI pathogen panel -GI consulted, appreciate assistance  Asthma, acute exacerbation Patient has as needed albuterol , Symbicort  but reportedly rarely uses.  She denies dyspnea but continues to have wheezing.   - Continue with systemic steroids for now - Start Symbicort   T2DM A1c 10.2 this hospitalization. Poorly controlled.  Will resume half dose insulin  given poor appetite.  -Continue CBG checks with SSI  CBG (last 3)  Recent Labs    11/28/23 0725 11/28/23 1155 11/28/23 1751  GLUCAP 191* 272* 294*    HTN Slightly elevated Continue metoprolol    CKD3a    Latest Ref Rng & Units 11/28/2023    4:55 AM 11/27/2023    2:59 AM 11/26/2023    7:37 PM  BMP  Glucose 70 - 99 mg/dL 815  630  718   BUN 8 - 23 mg/dL 20  17  18    Creatinine 0.44 - 1.00 mg/dL 9.10  9.03  8.85   Sodium 135 - 145 mmol/L 138  138  141   Potassium 3.5 - 5.1 mmol/L 4.0  5.1  4.3   Chloride 98 - 111  mmol/L 105  107  103   CO2 22 - 32 mmol/L 21  22  23    Calcium  8.9 - 10.3 mg/dL 8.9  8.4  89.5    Appears to be at baseline.    Hypothyroidism  Continue synthroid    Normocytic anemia  Iron  deficiency      Latest Ref Rng & Units 11/28/2023    4:55 AM 11/27/2023    2:59 AM 11/26/2023    7:37 PM  CBC  WBC 4.0 - 10.5 K/uL 20.1  18.5  15.8   Hemoglobin 12.0 - 15.0 g/dL 9.5  89.9  87.6   Hematocrit 36.0 - 46.0 % 30.1  32.2  38.3   Platelets 150 - 400 K/uL 255  234  262   Multifactorial. Has CKD and noted to have Fe deficiency as well.  -Start IV iron        Subjective: Abdominal pain is improved.  Able to drink fluids.   Physical Exam: Vitals:   11/27/23 1141 11/27/23 2052 11/28/23 0625 11/28/23 1353  BP: (!) 150/67 (!) 154/72 (!) 169/67 (!) 155/67  Pulse: 68 89 68 65  Resp: 20 20 20 14   Temp: 98.2 F (36.8 C) 98.2 F (36.8 C) 98.3 F (36.8 C) 98.3 F (36.8 C)  TempSrc: Oral Oral Oral Oral  SpO2: 100% 96% 100% 100%  Weight:      Height:  Physical Exam  Constitutional: In no distress.  Cardiovascular: Normal rate, regular rhythm. No lower extremity edema  Pulmonary: Non labored breathing on [place], no wheezing or rales.  No lower extremity edema Abdominal: Soft. Non distended. Mild TTP in periumbilical region  Musculoskeletal: Normal range of motion.     Neurological: Alert and oriented to person, place, and time. Non focal  Skin: Skin is warm and dry.    Data Reviewed:     Latest Ref Rng & Units 11/28/2023    4:55 AM 11/27/2023    2:59 AM 11/26/2023    7:37 PM  BMP  Glucose 70 - 99 mg/dL 815  630  718   BUN 8 - 23 mg/dL 20  17  18    Creatinine 0.44 - 1.00 mg/dL 9.10  9.03  8.85   Sodium 135 - 145 mmol/L 138  138  141   Potassium 3.5 - 5.1 mmol/L 4.0  5.1  4.3   Chloride 98 - 111 mmol/L 105  107  103   CO2 22 - 32 mmol/L 21  22  23    Calcium  8.9 - 10.3 mg/dL 8.9  8.4  89.5    Serum creatinine normalized.  Hyperglycemia      Latest Ref Rng & Units  11/28/2023    4:55 AM 11/27/2023    2:59 AM 11/26/2023    7:37 PM  CBC  WBC 4.0 - 10.5 K/uL 20.1  18.5  15.8   Hemoglobin 12.0 - 15.0 g/dL 9.5  89.9  87.6   Hematocrit 36.0 - 46.0 % 30.1  32.2  38.3   Platelets 150 - 400 K/uL 255  234  262    Worsening leukocytosis in the setting of steroid use   Family Communication: Daughter at bedside  Disposition: Status is: Inpatient Remains inpatient appropriate because: Further workup of patient's abdominal pain  Planned Discharge Destination: Home    Time spent: 35 minutes  Author: Alban Pepper, MD 11/28/2023 6:00 PM  For on call review [email or web address].

## 2023-11-28 NOTE — Inpatient Diabetes Management (Signed)
 Inpatient Diabetes Program Recommendations  AACE/ADA: New Consensus Statement on Inpatient Glycemic Control (2015)  Target Ranges:  Prepandial:   less than 140 mg/dL      Peak postprandial:   less than 180 mg/dL (1-2 hours)      Critically ill patients:  140 - 180 mg/dL   Lab Results  Component Value Date   GLUCAP 272 (H) 11/28/2023   HGBA1C 10.2 (H) 11/27/2023    Review of Glycemic Control  Latest Reference Range & Units 11/27/23 08:03 11/27/23 11:37 11/27/23 16:25 11/27/23 20:50 11/28/23 07:25 11/28/23 11:55  Glucose-Capillary 70 - 99 mg/dL 646 (H) 743 (H) 803 (H) 203 (H) 191 (H) 272 (H)  (H): Data is abnormally high  Diabetes history: DM2  Outpatient Diabetes medications:  Lantus  10 units every day Januvia 50 mg every day Freestyle Libre 14 day  Current orders for Inpatient glycemic control:  Novolog  0-6 units TID and 0-5 units QHS  Inpatient Diabetes Program Recommendations:    1-Novolog  0-15 units TID and 0-5 units at bedtime while receiving steroids; may need small amount of meal coverage TID as well.    2-Semglee  5 units every day (was ordered but now discontinued)  Thank you, Wyvonna Pinal, MSN, CDCES Diabetes Coordinator Inpatient Diabetes Program [phone] (team pager from 8a-5p)

## 2023-11-28 NOTE — TOC Initial Note (Signed)
 Transition of Care [facility]) - Initial/Assessment Note   Patient Details  Name: Hannah [name] MRN: [id number] Date of Birth: [birth year]  [facility] CM/SW Contact:    Duwaine GORMAN Aran, LCSW Phone Number: 11/28/2023, 8:53 AM  Clinical Narrative: Patient is from home with children. Patient is currently receiving IV antibiotics. TOC following for possible discharge needs.  Expected Discharge Plan:  (TBD) Barriers to Discharge: Continued Medical Work up  Expected Discharge Plan and Services In-house Referral: Clinical Social Work Living arrangements for the past 2 months: Single Family Home           DME Arranged: N/A DME Agency: NA  Prior Living Arrangements/Services Living arrangements for the past 2 months: Single Family Home Lives with:: Adult Children Patient language and need for interpreter reviewed:: Yes Do you feel safe going back to the place where you live?: Yes      Need for Family Participation in Patient Care: Yes (Comment) Care giver support system in place?: Yes (comment) Criminal Activity/Legal Involvement Pertinent to Current Situation/Hospitalization: No - Comment as needed  Activities of Daily Living ADL Screening (condition at time of admission) Independently performs ADLs?: No Does the patient have a NEW difficulty with bathing/dressing/toileting/self-feeding that is expected to last >3 days?: No Does the patient have a NEW difficulty with getting in/out of bed, walking, or climbing stairs that is expected to last >3 days?: No Does the patient have a NEW difficulty with communication that is expected to last >3 days?: No Is the patient deaf or have difficulty hearing?: Yes Does the patient have difficulty seeing, even when wearing glasses/contacts?: No Does the patient have difficulty concentrating, remembering, or making decisions?: No  Emotional Assessment Orientation: : Oriented to Self, Oriented to Place, Oriented to  Time Alcohol / Substance  Use: Not Applicable Psych Involvement: No (comment)  Admission diagnosis:  Colitis [K52.9] COPD exacerbation (HCC) [J44.1] Acute respiratory failure with hypoxia (HCC) [J96.01] Sepsis, due to unspecified organism, unspecified whether acute organ dysfunction present [facility]) [A41.9] Patient Active Problem List   Diagnosis Date Noted   Asthma, chronic obstructive, with acute exacerbation (HCC) 11/27/2023   CKD stage 3a, GFR 45-59 ml/min (HCC) 11/27/2023   Hypothyroidism    Hypertension    Insulin  dependent type 2 diabetes mellitus (HCC)    Colitis 11/26/2023   Sepsis (HCC) 02/27/2021   Restrictive lung disease 05/09/2019   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 ([address]), [place] - [address] [place] ([place]) [place] [address] Phone: [phone] Fax: [phone]  Social Drivers of Health (SDOH) Social History: SDOH Screenings   Food Insecurity: No Food Insecurity (11/27/2023)  Housing: Low Risk  (11/27/2023)  Transportation Needs: No Transportation Needs (11/27/2023)  Utilities: Not At Risk (11/27/2023)  Social Connections: Unknown (11/27/2023)  Tobacco Use: Low Risk  (11/27/2023)   SDOH Interventions:    Readmission Risk Interventions     No data to display

## 2023-11-28 NOTE — Progress Notes (Signed)
 Initial Nutrition Assessment   INTERVENTION:   -Ensure Plus High Protein po BID, each supplement provides 350 kcal and 20 grams of protein.   -Multivitamin with minerals daily  NUTRITION DIAGNOSIS:   Inadequate oral intake related to poor appetite, diarrhea as evidenced by per patient/family report.  GOAL:   Patient will meet greater than or equal to 90% of their needs  MONITOR:   PO intake, Supplement acceptance  REASON FOR ASSESSMENT:   Consult Assessment of nutrition requirement/status  ASSESSMENT:   89 y.o. female with medical history significant for buccal cancer status postresection and radiation, hypertension, hypothyroidism, type 2 diabetes mellitus, chronic lumbar radiculopathy, asthma, and CKD 3A who presents with abdominal pain.  Patient in room, daughter at bedside.  Patient follows a vegetarian at home but has had poor appetite and has eaten very little for 2 weeks. Pt having diarrhea and abdominal pain. At time of visit, pt was on clear liquids but diet was since advanced to vegetarian. Pt has drank Ensure in the past d/t h/o cancer. Pt does not like them but sometimes drinks them. Offered [employer] or [employer] as alternatives if Ensure does not work for patient. Will see.  Per daughter they are awaiting plans to see if pt will have colonoscopy.   Per family, pt had weighed as low as 70 lbs. Last MD visit, pt had increased to 90 lbs.  Current weight: 98 lbs. Monitor weight trends.   Medications: Prednisone , iron  infusion  Labs reviewed: CBGs:191-272   NUTRITION - FOCUSED PHYSICAL EXAM:  Declined.  Diet Order:   Diet Order             Diet vegetarian Room service appropriate? Yes; Fluid consistency: Thin  Diet effective now                   EDUCATION NEEDS:   Not appropriate for education at this time  Skin:  Skin Assessment: Reviewed RN Assessment  Last BM:  7/14  Height:   Ht Readings from Last 1 Encounters:  11/26/23 5' 2  (1.575 m)    Weight:   Wt Readings from Last 1 Encounters:  11/26/23 44.8 kg    BMI:  Body mass index is 18.06 kg/m.  Estimated Nutritional Needs:   Kcal:  1400-1600  Protein:  65-80g  Fluid:  1.6L/day   Morna Lee, MS, RD, LDN Inpatient Clinical Dietitian Contact via Secure chat

## 2023-11-29 ENCOUNTER — Inpatient Hospital Stay (HOSPITAL_COMMUNITY)

## 2023-11-29 DIAGNOSIS — K529 Noninfective gastroenteritis and colitis, unspecified: Secondary | ICD-10-CM | POA: Diagnosis not present

## 2023-11-29 LAB — BASIC METABOLIC PANEL WITH GFR
Anion gap: 8 (ref 5–15)
BUN: 19 mg/dL (ref 8–23)
CO2: 25 mmol/L (ref 22–32)
Calcium: 9 mg/dL (ref 8.9–10.3)
Chloride: 103 mmol/L (ref 98–111)
Creatinine, Ser: 0.82 mg/dL (ref 0.44–1.00)
GFR, Estimated: >60 mL/min (ref >60)
Glucose, Bld: 119 mg/dL — ABNORMAL HIGH (ref 70–99)
Potassium: 3.4 mmol/L — ABNORMAL LOW (ref 3.5–5.1)
Sodium: 136 mmol/L (ref 135–145)

## 2023-11-29 LAB — GLUCOSE, CAPILLARY
Glucose-Capillary: 125 mg/dL — ABNORMAL HIGH (ref 70–99)
Glucose-Capillary: 141 mg/dL — ABNORMAL HIGH (ref 70–99)
Glucose-Capillary: 256 mg/dL — ABNORMAL HIGH (ref 70–99)
Glucose-Capillary: 254 mg/dL — ABNORMAL HIGH (ref 70–99)

## 2023-11-29 LAB — MAGNESIUM: Magnesium: 1.9 mg/dL (ref 1.7–2.4)

## 2023-11-29 MED ORDER — DICLOFENAC SODIUM 1 % EX GEL: 2.0000 g | Freq: Three times a day (TID) | CUTANEOUS | Status: DC | PRN

## 2023-11-29 MED ORDER — POTASSIUM CHLORIDE 20 MEQ PO PACK
40.0000 meq | PACK | Freq: Once | ORAL | Status: AC
  Administered 2023-11-29: 40 meq via ORAL
  Filled 2023-11-29: qty 2

## 2023-11-29 MED ORDER — PREDNISONE 20 MG PO TABS
20.0000 mg | ORAL_TABLET | Freq: Every day | ORAL | Status: DC
  Administered 2023-11-30: 20 mg via ORAL
  Filled 2023-11-29: qty 1

## 2023-11-29 MED ORDER — INSULIN ASPART 100 UNIT/ML IJ SOLN
3.0000 [IU] | Freq: Three times a day (TID) | INTRAMUSCULAR | Status: DC
  Administered 2023-11-30 (×3): 3 [IU] via SUBCUTANEOUS

## 2023-11-29 MED ORDER — DEXTROSE IN LACTATED RINGERS 5 % IV SOLN: INTRAVENOUS | Status: DC

## 2023-11-29 MED ORDER — IOHEXOL 350 MG/ML SOLN
80.0000 mL | Freq: Once | INTRAVENOUS | Status: AC | PRN
  Administered 2023-11-29: 80 mL via INTRAVENOUS

## 2023-11-29 MED ORDER — SODIUM CHLORIDE 0.45 % IV SOLN: INTRAVENOUS | Status: AC

## 2023-11-29 MED ORDER — IRON SUCROSE 200 MG IVPB - SIMPLE MED
200.0000 mg | Freq: Once | Status: AC
  Administered 2023-11-30: 200 mg via INTRAVENOUS
  Filled 2023-11-29: qty 10

## 2023-11-29 NOTE — Plan of Care (Signed)

## 2023-11-29 NOTE — Progress Notes (Signed)
 Progress Note   Patient: Hannah [name] FMW:[id number] DOB: [birth year] DOA: 11/26/2023     3 DOS: the patient was seen and examined on 11/29/2023   Brief hospital course: Per HPI:  Hannah BEHARRY is an 89 y.o. female with medical history significant for buccal cancer status postresection and radiation, hypertension, hypothyroidism, type 2 diabetes mellitus, chronic lumbar radiculopathy, asthma, and CKD 3A who presents with abdominal pain.   Patient complains of pain across her upper abdomen.  She also reports experiencing diarrhea every time she sits down to urinate.  She denies vomiting.  She also notes feeling short of breath recently but denies any significant cough.  Assessment and Plan: Possible Colitis Presented with abdominal pain.  Reportedly with no diarrhea though loose stools with urination, but no multiple bowel movements a day.  Found to have leukocytosis and elevated lactate on labs and CT abdomen findings concerning for colitis.  Patient reportedly with 5-6 bowel movements today. -Continue empiric antibiotics for now -Follow-up GI pathogen panel -GI consulted, appreciate assistance  Asthma, acute exacerbation Patient has as needed albuterol , Symbicort  but reportedly rarely uses.  She denies dyspnea wheezing is improved.  - Continue with systemic steroids for now, taper this to 20mg  starting tmorrow - Continue Symbicort   T2DM A1c 10.2 this hospitalization. Poorly controlled.  Continue 10u LA at bedtime, start 3u TID with meals + SSI   CBG (last 3)  Recent Labs    11/28/23 1751 11/28/23 2106 11/29/23 0749  GLUCAP 294* 258* 125*    HTN Elevated Continue metoprolol   Hold on further titration of antihypertensives while inpatient given she is asx  CKD3a    Latest Ref Rng & Units 11/29/2023    4:53 AM 11/28/2023    4:55 AM 11/27/2023    2:59 AM  BMP  Glucose 70 - 99 mg/dL 880  815  630   BUN 8 - 23 mg/dL 19  20  17    Creatinine 0.44 - 1.00 mg/dL 9.17  9.10   9.03   Sodium 135 - 145 mmol/L 136  138  138   Potassium 3.5 - 5.1 mmol/L 3.4  4.0  5.1   Chloride 98 - 111 mmol/L 103  105  107   CO2 22 - 32 mmol/L 25  21  22    Calcium  8.9 - 10.3 mg/dL 9.0  8.9  8.4    Appears to be at baseline.    Hypothyroidism  Continue synthroid    Normocytic anemia  Iron  deficiency      Latest Ref Rng & Units 11/28/2023    7:11 PM 11/28/2023    4:55 AM 11/27/2023    2:59 AM  CBC  WBC 4.0 - 10.5 K/uL 18.4  20.1  18.5   Hemoglobin 12.0 - 15.0 g/dL 89.9  9.5  89.9   Hematocrit 36.0 - 46.0 % 30.8  30.1  32.2   Platelets 150 - 400 K/uL 281  255  234   Multifactorial. Has CKD and noted to have Fe deficiency as well.  -Continue IV iron  while inpatient       Subjective: No adominal pain has had 5-6 bowel movments Physical Exam: Vitals:   11/28/23 1947 11/28/23 2110 11/29/23 0424 11/29/23 1007  BP: (!) 180/80 (!) 185/75 (!) 185/80 (!) 158/77  Pulse: 87 68 71   Resp: 20  18   Temp: 98.7 F (37.1 C)  98.3 F (36.8 C)   TempSrc: Oral  Oral   SpO2: 100%  100%   Weight:  Height:        Physical Exam  Constitutional: In no distress.  Cardiovascular: Normal rate, regular rhythm. No lower extremity edema  Pulmonary: Non labored breathing on room air, no wheezing or rales.   Abdominal: Soft. Non distended and non tender Musculoskeletal: Normal range of motion.     Neurological: Alert and oriented to person, place, and time. Non focal  Skin: Skin is warm and dry. Bilateral lue on thighs with livedo reticularis.    Data Reviewed:     Latest Ref Rng & Units 11/29/2023    4:53 AM 11/28/2023    4:55 AM 11/27/2023    2:59 AM  BMP  Glucose 70 - 99 mg/dL 880  815  630   BUN 8 - 23 mg/dL 19  20  17    Creatinine 0.44 - 1.00 mg/dL 9.17  9.10  9.03   Sodium 135 - 145 mmol/L 136  138  138   Potassium 3.5 - 5.1 mmol/L 3.4  4.0  5.1   Chloride 98 - 111 mmol/L 103  105  107   CO2 22 - 32 mmol/L 25  21  22    Calcium  8.9 - 10.3 mg/dL 9.0  8.9  8.4    Serum  creatinine normalized.  Hyperglycemia      Latest Ref Rng & Units 11/28/2023    7:11 PM 11/28/2023    4:55 AM 11/27/2023    2:59 AM  CBC  WBC 4.0 - 10.5 K/uL 18.4  20.1  18.5   Hemoglobin 12.0 - 15.0 g/dL 89.9  9.5  89.9   Hematocrit 36.0 - 46.0 % 30.8  30.1  32.2   Platelets 150 - 400 K/uL 281  255  234    Leukocytosis improved  Hgb stable    Family Communication: Daughter at bedside  Disposition: Status is: Inpatient Remains inpatient appropriate because: Further workup of patient's abdominal pain. May require colonoscopy this hospitalization   Planned Discharge Destination: Home    Time spent: 35 minutes  Author: Alban Pepper, MD 11/29/2023 11:26 AM  For on call review [email or web address].

## 2023-11-29 NOTE — Progress Notes (Signed)
 Subjective: No complaints.  Feeling better.  Objective: Vital signs in last 24 hours: Temp:  [97.9 F (36.6 C)-98.7 F (37.1 C)] 97.9 F (36.6 C) (07/15 1215) Pulse Rate:  [68-87] 70 (07/15 1215) Resp:  [18-20] 18 (07/15 0424) BP: (158-185)/(70-80) 160/70 (07/15 1215) SpO2:  [100 %] 100 % (07/15 1215) Last BM Date : 11/29/23  Intake/Output from previous day: 07/14 0701 - 07/15 0700 In: 631.5 [P.O.:240; IV Piggyback:391.5] Out: 800 [Urine:800] Intake/Output this shift: Total I/O In: 100 [IV Piggyback:100] Out: -   General appearance: alert and no distress GI: soft, non-tender; bowel sounds normal; no masses,  no organomegaly  Lab Results: Recent Labs    11/27/23 0259 11/28/23 0455 11/28/23 1911  WBC 18.5* 20.1* 18.4*  HGB 10.0* 9.5* 10.0*  HCT 32.2* 30.1* 30.8*  PLT 234 255 281   BMET Recent Labs    11/27/23 0259 11/28/23 0455 11/29/23 0453  NA 138 138 136  K 5.1 4.0 3.4*  CL 107 105 103  CO2 22 21* 25  GLUCOSE 369* 184* 119*  BUN 17 20 19   CREATININE 0.96 0.89 0.82  CALCIUM  8.4* 8.9 9.0   LFT Recent Labs    11/28/23 0455  PROT 5.5*  ALBUMIN 2.7*  AST 15  ALT 12  ALKPHOS 27*  BILITOT 0.5   PT/INR Recent Labs    11/26/23 1948  LABPROT 14.4  INR 1.1   Hepatitis Panel No results for input(s): HEPBSAG, HCVAB, HEPAIGM, HEPBIGM in the last 72 hours. C-Diff No results for input(s): CDIFFTOX in the last 72 hours. Fecal Lactopherrin No results for input(s): FECLLACTOFRN in the last 72 hours.  Studies/Results: CT Angio Abd/Pel w/ and/or w/o Result Date: 11/29/2023 CLINICAL DATA:  Suspected chronic mesenteric ischemia abdominal pain, loose stools, elevated lactate with concern for colitis and possible ischemia EXAM: CTA ABDOMEN AND PELVIS WITHOUT AND WITH CONTRAST TECHNIQUE: Multidetector CT imaging of the abdomen and pelvis was performed using the standard protocol during bolus administration of intravenous contrast. Multiplanar  reconstructed images and MIPs were obtained and reviewed to evaluate the vascular anatomy. RADIATION DOSE REDUCTION: This exam was performed according to the departmental dose-optimization program which includes automated exposure control, adjustment of the mA and/or kV according to patient size and/or use of iterative reconstruction technique. CONTRAST:  80mL OMNIPAQUE  IOHEXOL  350 MG/ML SOLN COMPARISON:  November 26, 2023 and prior studies FINDINGS: VASCULAR Aorta: Normal caliber aorta with mild atherosclerotic plaque and without aneurysm, dissection, vasculitis or significant stenosis. Celiac: Patent. Minimal atherosclerotic plaque with no significant stenosis. SMA: Patent. Minimal atherosclerotic plaque with no significant stenosis. Renals: Patent with no significant stenosis. IMA: Patent with no significant stenosis. Inflow: Bilateral common, external, and internal iliac arteries are patent without significant stenosis. Proximal Outflow: Bilateral common femoral and visualized portions of the superficial and profunda femoral arteries are patent without significant stenosis. Veins: Portal vein, splenic vein, and SMV are patent. No portal venous gas. Image the [facility] veins are patent. Review of the MIP images confirms the above findings. NON-VASCULAR Lower chest: Trace left pleural effusion. Hepatobiliary: No suspicious liver lesion. No gallstones, gallbladder wall thickening, or biliary dilatation. Pancreas: Unremarkable. Spleen: Unremarkable. Adrenals/Urinary Tract: Adrenal glands are unremarkable. No nephrolithiasis or hydronephrosis. Stomach/Bowel: Nonspecific mild colonic wall thickening involving predominantly the sigmoid colon again identified. Unremarkable appearance of the transverse colon on the study. Appendix is unremarkable. No bowel obstruction. No pneumatosis. Small hiatal hernia. Lymphatic: No lymphadenopathy by size criteria. Reproductive: Unremarkable. Other: No free air or free fluid.  Musculoskeletal: Multilevel  degenerative vertebral changes. IMPRESSION: VASCULAR Minimal atherosclerotic disease with no arterial aneurysms, dissections, occlusions, or significant stenosis identified. No portal or mesenteric venous thrombosis identified. NON-VASCULAR 1. Improved appearance of the transverse colon on the study. Likely persistent colitis involving the sigmoid colon. Electronically Signed   By: Michaeline Blanch M.D.   On: 11/29/2023 13:06    Medications: Scheduled:  enoxaparin  (LOVENOX ) injection  30 mg Subcutaneous Q24H   feeding supplement  237 mL Oral BID BM   fluticasone  furoate-vilanterol  1 puff Inhalation Daily   insulin  aspart  0-5 Units Subcutaneous QHS   insulin  aspart  0-6 Units Subcutaneous TID WC   insulin  glargine-yfgn  10 Units Subcutaneous Q2200   latanoprost   1 drop Both Eyes QHS   levothyroxine   25 mcg Oral Q48H   levothyroxine   50 mcg Oral Q48H   metoprolol  tartrate  25 mg Oral BID   mirabegron  ER  25 mg Oral Daily   multivitamin with minerals  1 tablet Oral Daily   predniSONE   40 mg Oral Q breakfast   sodium chloride  flush  3 mL Intravenous Q12H   Continuous:  cefTRIAXone  (ROCEPHIN )  IV 1 g (11/28/23 2055)   metronidazole  Stopped (11/29/23 1106)    Assessment/Plan: 1) Colitis. 2) Diarrhea - ? resolved.   The patient appears well.  It is difficult to understand her completely with her accent, but it seems that she did not have any diarrheal bowel movements today.  Her WBC yesterday was at 18K, but no new results today.  She was able to tolerate PO without much difficulty.  The stool GI pathogen panel is pending.  She is on prednisone  for her history of asthma.  This is a contributing cause for the elevated WBC.  Her vital signs are stable and she is afebrile.  Plan: 1) Await stool tests. 2) Continue with IV and PO hydration. 3) Continue with ceftriaxone  and metronidazole  for now.   LOS: 3 days   Hannah [name] D 11/29/2023, 4:57 PM

## 2023-11-30 ENCOUNTER — Inpatient Hospital Stay (HOSPITAL_COMMUNITY)

## 2023-11-30 DIAGNOSIS — I10 Essential (primary) hypertension: Secondary | ICD-10-CM | POA: Diagnosis not present

## 2023-11-30 DIAGNOSIS — K529 Noninfective gastroenteritis and colitis, unspecified: Secondary | ICD-10-CM | POA: Diagnosis not present

## 2023-11-30 DIAGNOSIS — D509 Iron deficiency anemia, unspecified: Secondary | ICD-10-CM

## 2023-11-30 DIAGNOSIS — J441 Chronic obstructive pulmonary disease with (acute) exacerbation: Secondary | ICD-10-CM | POA: Diagnosis not present

## 2023-11-30 DIAGNOSIS — E039 Hypothyroidism, unspecified: Secondary | ICD-10-CM | POA: Diagnosis not present

## 2023-11-30 LAB — CBC WITH DIFFERENTIAL/PLATELET
Abs Immature Granulocytes: 1.01 K/uL — ABNORMAL HIGH (ref 0.00–0.07)
Basophils Absolute: 0.1 K/uL (ref 0.0–0.1)
Basophils Relative: 1 %
Eosinophils Absolute: 0 K/uL (ref 0.0–0.5)
Eosinophils Relative: 0 %
HCT: 34.3 % — ABNORMAL LOW (ref 36.0–46.0)
Hemoglobin: 11.1 g/dL — ABNORMAL LOW (ref 12.0–15.0)
Immature Granulocytes: 7 %
Lymphocytes Relative: 14 %
Lymphs Abs: 2.1 K/uL (ref 0.7–4.0)
MCH: 29.4 pg (ref 26.0–34.0)
MCHC: 32.4 g/dL (ref 30.0–36.0)
MCV: 91 fL (ref 80.0–100.0)
Monocytes Absolute: 1.1 K/uL — ABNORMAL HIGH (ref 0.1–1.0)
Monocytes Relative: 7 %
Neutro Abs: 11.1 K/uL — ABNORMAL HIGH (ref 1.7–7.7)
Neutrophils Relative %: 71 %
Platelets: 296 K/uL (ref 150–400)
RBC: 3.77 MIL/uL — ABNORMAL LOW (ref 3.87–5.11)
RDW: 12.6 % (ref 11.5–15.5)
WBC: 15.4 K/uL — ABNORMAL HIGH (ref 4.0–10.5)
nRBC: 0.3 % — ABNORMAL HIGH (ref 0.0–0.2)

## 2023-11-30 LAB — GASTROINTESTINAL PANEL BY PCR, STOOL (REPLACES STOOL CULTURE)

## 2023-11-30 LAB — BASIC METABOLIC PANEL WITH GFR
Anion gap: 10 (ref 5–15)
BUN: 17 mg/dL (ref 8–23)
CO2: 25 mmol/L (ref 22–32)
Calcium: 8.9 mg/dL (ref 8.9–10.3)
Chloride: 98 mmol/L (ref 98–111)
Creatinine, Ser: 0.69 mg/dL (ref 0.44–1.00)
GFR, Estimated: >60 mL/min (ref >60)
Glucose, Bld: 106 mg/dL — ABNORMAL HIGH (ref 70–99)
Potassium: 3.7 mmol/L (ref 3.5–5.1)
Sodium: 133 mmol/L — ABNORMAL LOW (ref 135–145)

## 2023-11-30 LAB — GLUCOSE, CAPILLARY
Glucose-Capillary: 117 mg/dL — ABNORMAL HIGH (ref 70–99)
Glucose-Capillary: 255 mg/dL — ABNORMAL HIGH (ref 70–99)
Glucose-Capillary: 304 mg/dL — ABNORMAL HIGH (ref 70–99)
Glucose-Capillary: 293 mg/dL — ABNORMAL HIGH (ref 70–99)

## 2023-11-30 LAB — MAGNESIUM: Magnesium: 2 mg/dL (ref 1.7–2.4)

## 2023-11-30 LAB — PHOSPHORUS: Phosphorus: 2.4 mg/dL — ABNORMAL LOW (ref 2.5–4.6)

## 2023-11-30 MED ORDER — PANTOPRAZOLE SODIUM 40 MG PO TBEC
40.0000 mg | DELAYED_RELEASE_TABLET | Freq: Every day | ORAL | Status: DC
  Administered 2023-11-30 – 2023-12-03 (×4): 40 mg via ORAL
  Filled 2023-11-30 (×4): qty 1

## 2023-11-30 MED ORDER — INSULIN GLARGINE-YFGN 100 UNIT/ML ~~LOC~~ SOLN
20.0000 [IU] | Freq: Every day | SUBCUTANEOUS | Status: DC
  Administered 2023-11-30: 20 [IU] via SUBCUTANEOUS
  Filled 2023-11-30: qty 0.2

## 2023-11-30 MED ORDER — METHYLPREDNISOLONE SODIUM SUCC 40 MG IJ SOLR
40.0000 mg | Freq: Two times a day (BID) | INTRAMUSCULAR | Status: DC
  Administered 2023-11-30 – 2023-12-01 (×2): 40 mg via INTRAVENOUS
  Filled 2023-11-30 (×2): qty 1

## 2023-11-30 MED ORDER — IPRATROPIUM-ALBUTEROL 0.5-2.5 (3) MG/3ML IN SOLN
3.0000 mL | Freq: Three times a day (TID) | RESPIRATORY_TRACT | Status: DC
  Administered 2023-11-30 – 2023-12-03 (×8): 3 mL via RESPIRATORY_TRACT
  Filled 2023-11-30 (×8): qty 3

## 2023-11-30 MED ORDER — INSULIN ASPART 100 UNIT/ML IJ SOLN: 4.0000 [IU] | Freq: Three times a day (TID) | INTRAMUSCULAR | Status: DC

## 2023-11-30 MED ORDER — AMLODIPINE BESYLATE 5 MG PO TABS
2.5000 mg | ORAL_TABLET | Freq: Every day | ORAL | Status: DC
  Administered 2023-11-30 – 2023-12-04 (×5): 2.5 mg via ORAL
  Filled 2023-11-30 (×5): qty 1

## 2023-11-30 MED ORDER — BUDESONIDE 0.5 MG/2ML IN SUSP
0.5000 mg | Freq: Two times a day (BID) | RESPIRATORY_TRACT | Status: DC
  Administered 2023-11-30 – 2023-12-04 (×8): 0.5 mg via RESPIRATORY_TRACT
  Filled 2023-11-30 (×8): qty 2

## 2023-11-30 MED ORDER — ARFORMOTEROL TARTRATE 15 MCG/2ML IN NEBU
15.0000 ug | INHALATION_SOLUTION | Freq: Two times a day (BID) | RESPIRATORY_TRACT | Status: DC
  Administered 2023-11-30 – 2023-12-04 (×8): 15 ug via RESPIRATORY_TRACT
  Filled 2023-11-30 (×8): qty 2

## 2023-11-30 MED ORDER — IPRATROPIUM-ALBUTEROL 0.5-2.5 (3) MG/3ML IN SOLN: 3.0000 mL | Freq: Three times a day (TID) | RESPIRATORY_TRACT | Status: DC

## 2023-11-30 MED ORDER — VITAMIN D3 25 MCG (1000 UNIT) PO TABS
5000.0000 [IU] | ORAL_TABLET | Freq: Every day | ORAL | Status: DC
  Administered 2023-11-30 – 2023-12-04 (×5): 5000 [IU] via ORAL
  Filled 2023-11-30 (×5): qty 5

## 2023-11-30 MED ORDER — LORATADINE 10 MG PO TABS
10.0000 mg | ORAL_TABLET | Freq: Every day | ORAL | Status: DC
  Administered 2023-11-30 – 2023-12-04 (×5): 10 mg via ORAL
  Filled 2023-11-30 (×5): qty 1

## 2023-11-30 MED ORDER — SODIUM CHLORIDE 0.9 % IV SOLN
2.0000 g | INTRAVENOUS | Status: AC
  Administered 2023-12-01 – 2023-12-03 (×3): 2 g via INTRAVENOUS
  Filled 2023-11-30 (×3): qty 20

## 2023-11-30 MED ORDER — SIMVASTATIN 20 MG PO TABS
20.0000 mg | ORAL_TABLET | Freq: Every day | ORAL | Status: DC
  Administered 2023-11-30 – 2023-12-03 (×4): 20 mg via ORAL
  Filled 2023-11-30 (×4): qty 1

## 2023-11-30 MED ORDER — EZETIMIBE-SIMVASTATIN 10-20 MG PO TABS: 1.0000 | ORAL_TABLET | Freq: Every day | ORAL | Status: DC

## 2023-11-30 MED ORDER — DONEPEZIL HCL 5 MG PO TABS
5.0000 mg | ORAL_TABLET | Freq: Every day | ORAL | Status: DC
  Administered 2023-11-30 – 2023-12-02 (×3): 5 mg via ORAL
  Filled 2023-11-30 (×4): qty 1

## 2023-11-30 MED ORDER — EZETIMIBE 10 MG PO TABS
10.0000 mg | ORAL_TABLET | Freq: Every day | ORAL | Status: DC
  Administered 2023-11-30 – 2023-12-03 (×4): 10 mg via ORAL
  Filled 2023-11-30 (×4): qty 1

## 2023-11-30 MED ORDER — INSULIN ASPART 100 UNIT/ML IJ SOLN
6.0000 [IU] | Freq: Three times a day (TID) | INTRAMUSCULAR | Status: DC
  Administered 2023-12-01: 6 [IU] via SUBCUTANEOUS

## 2023-11-30 MED ORDER — K PHOS MONO-SOD PHOS DI & MONO 155-852-130 MG PO TABS
250.0000 mg | ORAL_TABLET | Freq: Two times a day (BID) | ORAL | Status: AC
  Administered 2023-11-30 – 2023-12-02 (×5): 250 mg via ORAL
  Filled 2023-11-30 (×5): qty 1

## 2023-11-30 NOTE — Progress Notes (Addendum)
 PROGRESS NOTE    Hannah [name]  FMW:[id number] DOB: May 19, 1932 DOA: 11/26/2023 PCP: Sun, Vyvyan, MD   Chief Complaint  Patient presents with   Abdominal Pain    Brief Narrative:  Per HPI:  Hannah [name] is an 89 y.o. female with medical history significant for buccal cancer status postresection and radiation, hypertension, hypothyroidism, type 2 diabetes mellitus, chronic lumbar radiculopathy, asthma, and CKD 3A who presents with abdominal pain.   Patient complains of pain across her upper abdomen.  She also reports experiencing diarrhea every time she sits down to urinate.  She denies vomiting.  She also notes feeling short of breath recently but denies any significant cough.   Assessment & Plan:   Principal Problem:   Colitis Active Problems:   Asthma, chronic obstructive, with acute exacerbation (HCC)   Hypothyroidism   Hypertension   Insulin  dependent type 2 diabetes mellitus (HCC)   CKD stage 3a, GFR 45-59 ml/min (HCC)   Iron  deficiency anemia   #1 colitis -Patient presented with abdominal pain, noted to have diarrhea prior to admission. - CT abdomen and pelvis done concerning for colitis. -Per RN patient with decreased volume of stool with improvement with consistency. - GI pathogen panel negative. - Per RN C. difficile unable to be obtained as patient with no further watery diarrhea with improvement with stool consistency. - Patient on IV Rocephin  and IV Flagyl . - Patient noted to have received 4 days of IV Flagyl  and IV Rocephin  and currently still on IV Flagyl  - Will place on 3 more days of IV Rocephin . - Continue IV fluids, supportive care. - Per GI.  2.  Acute asthma exacerbation versus COPD exacerbation -Noted with wheezing on examination. - Chest x-ray obtained this afternoon negative for any acute infiltrate. - Discontinue Breo Ellipta . - Placed on Brovana , Pulmicort , scheduled DuoNebs. - Discontinue prednisone  and placed on IV Solu-Medrol  40 mg  every 12 hours. - Start Claritin , Flonase, PPI.  3.  Diabetes mellitus type 2 -Hemoglobin A1c noted at 10.2. - CBG of 117 this morning. - Increase long-acting Semglee  to home regimen of 20 units daily.   - Increase meal coverage NovoLog  6 units 3 times daily with meals.  - SSI.  4.  Hypertension - Continue metoprolol . - Start Norvasc  2.5 mg daily.  5.  CKD 3A -Stable.  6.  Hypothyroidism -Continue Synthroid .  7.  Normocytic anemia/iron  deficiency anemia -Hemoglobin currently stable at 11.1. - Status post IV iron .   DVT prophylaxis: SCDs Code Status: Full Family Communication: Updated patient and daughter at bedside. Disposition: TBD  Status is: Inpatient Remains inpatient appropriate because: Severity of illness   Consultants:  Gastroenterology: Dr. Kristie 11/28/2023  Procedures:  CT angiogram chest 11/26/2023 CT abdomen and pelvis 11/26/2023 CT angiogram abdomen and pelvis 11/29/2023 Chest x-ray 11/26/2023, 11/30/2023  Antimicrobials:  Anti-infectives (From admission, onward)    Start     Dose/Rate Route Frequency Ordered Stop   11/27/23 2000  ceFEPIme  (MAXIPIME ) 2 g in sodium chloride  0.9 % 100 mL IVPB  Status:  Discontinued        2 g 200 mL/hr over 30 Minutes Intravenous Every 24 hours 11/27/23 0100 11/27/23 0841   11/27/23 2000  cefTRIAXone  (ROCEPHIN ) 1 g in sodium chloride  0.9 % 100 mL IVPB       Note to Pharmacy: Need assistance with timing, given cefepime  dose at 2000 7/12   1 g 200 mL/hr over 30 Minutes Intravenous Every 24 hours 11/27/23 0841 11/30/23 2104   11/27/23  0900  metroNIDAZOLE  (FLAGYL ) IVPB 500 mg        500 mg 100 mL/hr over 60 Minutes Intravenous Every 12 hours 11/27/23 0038 12/04/23 0859   11/26/23 2000  aztreonam (AZACTAM) 2 g in sodium chloride  0.9 % 100 mL IVPB  Status:  Discontinued        2 g 200 mL/hr over 30 Minutes Intravenous  Once 11/26/23 1948 11/26/23 1957   11/26/23 2000  metroNIDAZOLE  (FLAGYL ) IVPB 500 mg        500 mg 100  mL/hr over 60 Minutes Intravenous  Once 11/26/23 1948 11/26/23 2220   11/26/23 2000  vancomycin  (VANCOCIN ) IVPB 1000 mg/200 mL premix        1,000 mg 200 mL/hr over 60 Minutes Intravenous  Once 11/26/23 1948 11/26/23 2326   11/26/23 2000  cefTRIAXone  (ROCEPHIN ) 2 g in sodium chloride  0.9 % 100 mL IVPB  Status:  Discontinued        2 g 200 mL/hr over 30 Minutes Intravenous  Once 11/26/23 1957 11/26/23 1959   11/26/23 2000  ceFEPIme  (MAXIPIME ) 2 g in sodium chloride  0.9 % 100 mL IVPB        2 g 200 mL/hr over 30 Minutes Intravenous  Once 11/26/23 1959 11/26/23 2126         Subjective: Patient lying in bed.  Denies any chest pain.  Feels abdominal pain has improved.  Patient states diarrhea has improved however daughter thinks otherwise.  Per RN patient with 1 bowel movement overnight and stool noted to be more formed.  Daughter states patient also with complaints of urinary incontinence.  Objective: Vitals:   11/30/23 0700 11/30/23 0800 11/30/23 1005 11/30/23 1140  BP: (!) 177/92  (!) 178/93 (!) 168/78  Pulse: 86   72  Resp: 14   19  Temp: 98.5 F (36.9 C)   99.1 F (37.3 C)  TempSrc: Oral   Oral  SpO2: 98% 100%  98%  Weight:      Height:        Intake/Output Summary (Last 24 hours) at 11/30/2023 2132 Last data filed at 11/30/2023 1600 Gross per 24 hour  Intake 1431.3 ml  Output 1800 ml  Net -368.7 ml   Filed Weights   11/26/23 2131  Weight: 44.8 kg    Examination:  General exam: Appears calm and comfortable  Respiratory system: Some diffuse wheezing.  No crackles.  No significant rhonchi.  Fair air movement.  Speaking in full sentences.  Cardiovascular system: S1 & S2 heard, RRR. No JVD, murmurs, rubs, gallops or clicks. No pedal edema. Gastrointestinal system: Abdomen is nondistended, soft and nontender. No organomegaly or masses felt. Normal bowel sounds heard. Central nervous system: Alert and oriented. No focal neurological deficits. Extremities: Symmetric 5 x 5  power. Skin: No rashes, lesions or ulcers Psychiatry: Judgement and insight appear normal. Mood & affect appropriate.     Data Reviewed: I have personally reviewed following labs and imaging studies  CBC: Recent Labs  Lab 11/26/23 1937 11/27/23 0259 11/28/23 0455 11/28/23 1911 11/30/23 0535  WBC 15.8* 18.5* 20.1* 18.4* 15.4*  NEUTROABS 13.1*  --  16.8* 15.7* 11.1*  HGB 12.3 10.0* 9.5* 10.0* 11.1*  HCT 38.3 32.2* 30.1* 30.8* 34.3*  MCV 94.6 97.6 96.5 93.3 91.0  PLT 262 234 255 281 296    Basic Metabolic Panel: Recent Labs  Lab 11/26/23 1937 11/27/23 0259 11/28/23 0455 11/29/23 0453 11/30/23 0535  NA 141 138 138 136 133*  K 4.3 5.1 4.0 3.4*  3.7  CL 103 107 105 103 98  CO2 23 22 21* 25 25  GLUCOSE 281* 369* 184* 119* 106*  BUN 18 17 20 19 17   CREATININE 1.14* 0.96 0.89 0.82 0.69  CALCIUM  10.4* 8.4* 8.9 9.0 8.9  MG  --   --   --  1.9 2.0  PHOS  --   --   --   --  2.4*    GFR: Estimated Creatinine Clearance: 34.4 mL/min (by C-G formula based on SCr of 0.69 mg/dL).  Liver Function Tests: Recent Labs  Lab 11/26/23 1937 11/28/23 0455  AST 21 15  ALT 14 12  ALKPHOS 49 27*  BILITOT 0.5 0.5  PROT 7.5 5.5*  ALBUMIN 4.2 2.7*    CBG: Recent Labs  Lab 11/29/23 1651 11/29/23 2146 11/30/23 0748 11/30/23 1135 11/30/23 1639  GLUCAP 256* 254* 117* 255* 304*     Recent Results (from the past 240 hours)  Resp panel by RT-PCR (RSV, Flu A&B, Covid) Anterior Nasal Swab     Status: None   Collection Time: 11/26/23  7:37 PM   Specimen: Anterior Nasal Swab  Result Value Ref Range Status   SARS Coronavirus 2 by RT PCR NEGATIVE NEGATIVE Final    Comment: (NOTE) SARS-CoV-2 target nucleic acids are NOT DETECTED.  The SARS-CoV-2 RNA is generally detectable in upper respiratory specimens during the acute phase of infection. The lowest concentration of SARS-CoV-2 viral copies this assay can detect is 138 copies/mL. A negative result does not preclude  SARS-Cov-2 infection and should not be used as the sole basis for treatment or other patient management decisions. A negative result may occur with  improper specimen collection/handling, submission of specimen other than nasopharyngeal swab, presence of viral mutation(s) within the areas targeted by this assay, and inadequate number of viral copies(<138 copies/mL). A negative result must be combined with clinical observations, patient history, and epidemiological information. The expected result is Negative.  Fact Sheet for Patients:  BloggerCourse.com  Fact Sheet for Healthcare Providers:  SeriousBroker.it  This test is no t yet approved or cleared by the [place]  FDA and  has been authorized for detection and/or diagnosis of SARS-CoV-2 by FDA under an Emergency Use Authorization (EUA). This EUA will remain  in effect (meaning this test can be used) for the duration of the COVID-19 declaration under Section 564(b)(1) of the Act, 21 U.S.C.section 360bbb-3(b)(1), unless the authorization is terminated  or revoked sooner.       Influenza A by PCR NEGATIVE NEGATIVE Final   Influenza B by PCR NEGATIVE NEGATIVE Final    Comment: (NOTE) The Xpert Xpress SARS-CoV-2/FLU/RSV plus assay is intended as an aid in the diagnosis of influenza from Nasopharyngeal swab specimens and should not be used as a sole basis for treatment. Nasal washings and aspirates are unacceptable for Xpert Xpress SARS-CoV-2/FLU/RSV testing.  Fact Sheet for Patients: BloggerCourse.com  Fact Sheet for Healthcare Providers: SeriousBroker.it  This test is not yet approved or cleared by the [place]  FDA and has been authorized for detection and/or diagnosis of SARS-CoV-2 by FDA under an Emergency Use Authorization (EUA). This EUA will remain in effect (meaning this test can be used) for the duration of  the COVID-19 declaration under Section 564(b)(1) of the Act, 21 U.S.C. section 360bbb-3(b)(1), unless the authorization is terminated or revoked.     Resp Syncytial Virus by PCR NEGATIVE NEGATIVE Final    Comment: (NOTE) Fact Sheet for Patients: BloggerCourse.com  Fact Sheet for Healthcare Providers: SeriousBroker.it  This test is not yet approved or cleared by the [place]  FDA and has been authorized for detection and/or diagnosis of SARS-CoV-2 by FDA under an Emergency Use Authorization (EUA). This EUA will remain in effect (meaning this test can be used) for the duration of the COVID-19 declaration under Section 564(b)(1) of the Act, 21 U.S.C. section 360bbb-3(b)(1), unless the authorization is terminated or revoked.  Performed at [facility], [address] [place], [place] [address]   Blood Culture (routine x 2)     Status: None (Preliminary result)   Collection Time: 11/26/23  7:48 PM   Specimen: BLOOD  Result Value Ref Range Status   Specimen Description   Final    BLOOD RIGHT ANTECUBITAL Performed at [facility], [address], [place], [place] [address]    Special Requests   Final    BOTTLES DRAWN AEROBIC AND ANAEROBIC Blood Culture results may not be optimal due to an inadequate volume of blood received in culture bottles   Culture   Final    NO GROWTH 4 DAYS Performed at [facility], [address] [place], [place] [address]    Report Status PENDING  Incomplete  Blood Culture (routine x 2)     Status: None (Preliminary result)   Collection Time: 11/26/23  8:09 PM   Specimen: BLOOD  Result Value Ref Range Status   Specimen Description   Final    BLOOD LEFT ANTECUBITAL Performed at [facility], [address] [place], [place] [address]    Special Requests   Final    BOTTLES DRAWN AEROBIC AND ANAEROBIC Blood Culture results may not be  optimal due to an inadequate volume of blood received in culture bottles Performed at [facility], [address] [place], [place] [address]    Culture   Final    NO GROWTH 4 DAYS Performed at [facility], [address] [place], [place] [address]    Report Status PENDING  Incomplete  Respiratory (~20 pathogens) panel by PCR     Status: None   Collection Time: 11/27/23 10:40 PM   Specimen: Nasopharyngeal Swab; Respiratory  Result Value Ref Range Status   Adenovirus NOT DETECTED NOT DETECTED Final   Coronavirus 229E NOT DETECTED NOT DETECTED Final    Comment: (NOTE) The Coronavirus on the Respiratory Panel, DOES NOT test for the novel  Coronavirus (2019 nCoV)    Coronavirus HKU1 NOT DETECTED NOT DETECTED Final   Coronavirus NL63 NOT DETECTED NOT DETECTED Final   Coronavirus OC43 NOT DETECTED NOT DETECTED Final   Metapneumovirus NOT DETECTED NOT DETECTED Final   Rhinovirus / Enterovirus NOT DETECTED NOT DETECTED Final   Influenza A NOT DETECTED NOT DETECTED Final   Influenza B NOT DETECTED NOT DETECTED Final   Parainfluenza Virus 1 NOT DETECTED NOT DETECTED Final   Parainfluenza Virus 2 NOT DETECTED NOT DETECTED Final   Parainfluenza Virus 3 NOT DETECTED NOT DETECTED Final   Parainfluenza Virus 4 NOT DETECTED NOT DETECTED Final   Respiratory Syncytial Virus NOT DETECTED NOT DETECTED Final   Bordetella pertussis NOT DETECTED NOT DETECTED Final   Bordetella Parapertussis NOT DETECTED NOT DETECTED Final   Chlamydophila pneumoniae NOT DETECTED NOT DETECTED Final   Mycoplasma pneumoniae NOT DETECTED NOT DETECTED Final    Comment: Performed at [facility], [address], [place], [place] [address]  Gastrointestinal Panel by PCR , Stool     Status: None   Collection Time: 11/29/23  1:18 PM   Specimen: Stool  Result Value Ref Range Status   Campylobacter species NOT DETECTED NOT DETECTED Final   Plesimonas shigelloides NOT DETECTED NOT DETECTED  Final   Salmonella species NOT DETECTED NOT DETECTED Final   Yersinia enterocolitica NOT DETECTED NOT DETECTED Final   Vibrio species NOT DETECTED NOT DETECTED Final   Vibrio cholerae NOT DETECTED NOT DETECTED Final   Enteroaggregative E coli (EAEC) NOT DETECTED NOT DETECTED Final   Enteropathogenic E coli (EPEC) NOT DETECTED NOT DETECTED Final   Enterotoxigenic E coli (ETEC) NOT DETECTED NOT DETECTED Final   Shiga like toxin producing E coli (STEC) NOT DETECTED NOT DETECTED Final   Shigella/Enteroinvasive E coli (EIEC) NOT DETECTED NOT DETECTED Final   Cryptosporidium NOT DETECTED NOT DETECTED Final   Cyclospora cayetanensis NOT DETECTED NOT DETECTED Final   Entamoeba histolytica NOT DETECTED NOT DETECTED Final   Giardia lamblia NOT DETECTED NOT DETECTED Final   Adenovirus F40/41 NOT DETECTED NOT DETECTED Final   Astrovirus NOT DETECTED NOT DETECTED Final   Norovirus GI/GII NOT DETECTED NOT DETECTED Final   Rotavirus A NOT DETECTED NOT DETECTED Final   Sapovirus (I, II, IV, and V) NOT DETECTED NOT DETECTED Final    Comment: Performed at [facility], [address], [place], [place] [address]         Radiology Studies: DG CHEST PORT 1 VIEW Result Date: 11/30/2023 CLINICAL DATA:  Wheezing.  History of colitis EXAM: PORTABLE CHEST 1 VIEW COMPARISON:  Chest x-ray and CTA chest 11/26/2023. FINDINGS: Hyperinflation. Film is rotated to the left. No consolidation,. Normal cardiopericardial silhouette. Calcifications along the tracheobronchial tree. Some kyphosis. IMPRESSION: Hyperinflation. No acute cardiopulmonary disease. Rotated radiograph. Electronically Signed   By: Ranell Bring M.D.   On: 11/30/2023 18:58   CT Angio Abd/Pel w/ and/or w/o Result Date: 11/29/2023 CLINICAL DATA:  Suspected chronic mesenteric ischemia abdominal pain, loose stools, elevated lactate with concern for colitis and possible ischemia EXAM: CTA ABDOMEN AND PELVIS WITHOUT AND WITH CONTRAST TECHNIQUE:  Multidetector CT imaging of the abdomen and pelvis was performed using the standard protocol during bolus administration of intravenous contrast. Multiplanar reconstructed images and MIPs were obtained and reviewed to evaluate the vascular anatomy. RADIATION DOSE REDUCTION: This exam was performed according to the departmental dose-optimization program which includes automated exposure control, adjustment of the mA and/or kV according to patient size and/or use of iterative reconstruction technique. CONTRAST:  80mL OMNIPAQUE  IOHEXOL  350 MG/ML SOLN COMPARISON:  November 26, 2023 and prior studies FINDINGS: VASCULAR Aorta: Normal caliber aorta with mild atherosclerotic plaque and without aneurysm, dissection, vasculitis or significant stenosis. Celiac: Patent. Minimal atherosclerotic plaque with no significant stenosis. SMA: Patent. Minimal atherosclerotic plaque with no significant stenosis. Renals: Patent with no significant stenosis. IMA: Patent with no significant stenosis. Inflow: Bilateral common, external, and internal iliac arteries are patent without significant stenosis. Proximal Outflow: Bilateral common femoral and visualized portions of the superficial and profunda femoral arteries are patent without significant stenosis. Veins: Portal vein, splenic vein, and SMV are patent. No portal venous gas. Image the [facility] veins are patent. Review of the MIP images confirms the above findings. NON-VASCULAR Lower chest: Trace left pleural effusion. Hepatobiliary: No suspicious liver lesion. No gallstones, gallbladder wall thickening, or biliary dilatation. Pancreas: Unremarkable. Spleen: Unremarkable. Adrenals/Urinary Tract: Adrenal glands are unremarkable. No nephrolithiasis or hydronephrosis. Stomach/Bowel: Nonspecific mild colonic wall thickening involving predominantly the sigmoid colon again identified. Unremarkable appearance of the transverse colon on the study. Appendix is unremarkable. No  bowel obstruction. No  pneumatosis. Small hiatal hernia. Lymphatic: No lymphadenopathy by size criteria. Reproductive: Unremarkable. Other: No free air or free fluid. Musculoskeletal: Multilevel degenerative vertebral changes. IMPRESSION: VASCULAR Minimal atherosclerotic disease with no arterial aneurysms, dissections, occlusions, or significant stenosis identified. No portal or mesenteric venous thrombosis identified. NON-VASCULAR 1. Improved appearance of the transverse colon on the study. Likely persistent colitis involving the sigmoid colon. Electronically Signed   By: Michaeline Blanch M.D.   On: 11/29/2023 13:06        Scheduled Meds:  amLODipine   2.5 mg Oral Daily   arformoterol   15 mcg Nebulization BID   budesonide  (PULMICORT ) nebulizer solution  0.5 mg Nebulization BID   cholecalciferol   5,000 Units Oral Daily   donepezil   5 mg Oral QHS   ezetimibe   10 mg Oral q1800   And   simvastatin   20 mg Oral q1800   feeding supplement  237 mL Oral BID BM   insulin  aspart  0-5 Units Subcutaneous QHS   insulin  aspart  0-6 Units Subcutaneous TID WC   insulin  aspart  3 Units Subcutaneous TID WC   insulin  glargine-yfgn  10 Units Subcutaneous Q2200   ipratropium-albuterol   3 mL Nebulization TID   latanoprost   1 drop Both Eyes QHS   levothyroxine   25 mcg Oral Q48H   levothyroxine   50 mcg Oral Q48H   loratadine   10 mg Oral Daily   metoprolol  tartrate  25 mg Oral BID   mirabegron  ER  25 mg Oral Daily   multivitamin with minerals  1 tablet Oral Daily   pantoprazole   40 mg Oral Q0600   phosphorus  250 mg Oral BID   predniSONE   20 mg Oral Q breakfast   sodium chloride  flush  3 mL Intravenous Q12H   Continuous Infusions:  metronidazole  500 mg (11/30/23 2126)     LOS: 4 days    Time spent: 40 minutes    Toribio Hummer, MD [facility]   To contact the attending provider between 7A-7P or the covering provider during after hours 7P-7A, please log into the web site [email or web address] and access using universal  [place] password for that web site. If you do not have the password, please call the hospital operator.  11/30/2023, 9:32 PM

## 2023-11-30 NOTE — Progress Notes (Signed)
 Subjective: Patient seems to be doing much better today.  She has had 1 small volume BM today with no abdominal pain whatsoever.  She is tolerating her diet well.  Her daughters are bringing her food from home but she is a very vegetarian.  She is anxious to go home but she developed some wheezing in the left side of her chest and some urinary frequency which the family wants addressed before they can take her home.  Her daughters were at the bedside requesting whether she will have a colonoscopy done or not.  Objective: Vital signs in last 24 hours: Temp:  [98.4 F (36.9 C)-99.1 F (37.3 C)] 99.1 F (37.3 C) (07/16 1140) Pulse Rate:  [72-110] 72 (07/16 1140) Resp:  [14-19] 19 (07/16 1140) BP: (153-178)/(78-93) 168/78 (07/16 1140) SpO2:  [98 %-100 %] 98 % (07/16 1140) Last BM Date : 11/30/23  Intake/Output from previous day: 07/15 0701 - 07/16 0700 In: 986.8 [P.O.:240; I.V.:546.8; IV Piggyback:200] Out: 200 [Urine:200] Intake/Output this shift: Total I/O In: 544.5 [P.O.:120; I.V.:424.5] Out: 1600 [Urine:1600]  General appearance: alert, cooperative, appears stated age, fatigued, and no distress Resp: wheezes LLL and LUL Cardio: regular rate and rhythm, S1, S2 normal, no murmur, click, rub or gallop GI: soft, non-tender; bowel sounds normal; no masses,  no organomegaly  Lab Results: Recent Labs    11/28/23 0455 11/28/23 1911 11/30/23 0535  WBC 20.1* 18.4* 15.4*  HGB 9.5* 10.0* 11.1*  HCT 30.1* 30.8* 34.3*  PLT 255 281 296   BMET Recent Labs    11/28/23 0455 11/29/23 0453 11/30/23 0535  NA 138 136 133*  K 4.0 3.4* 3.7  CL 105 103 98  CO2 21* 25 25  GLUCOSE 184* 119* 106*  BUN 20 19 17   CREATININE 0.89 0.82 0.69  CALCIUM  8.9 9.0 8.9   LFT Recent Labs    11/28/23 0455  PROT 5.5*  ALBUMIN 2.7*  AST 15  ALT 12  ALKPHOS 27*  BILITOT 0.5   PT/INR No results for input(s): LABPROT, INR in the last 72 hours. Hepatitis Panel No results for input(s):  HEPBSAG, HCVAB, HEPAIGM, HEPBIGM in the last 72 hours. C-Diff No results for input(s): CDIFFTOX in the last 72 hours. No results for input(s): CDIFFPCR in the last 72 hours. Fecal Lactopherrin No results for input(s): FECLLACTOFRN in the last 72 hours.  Studies/Results: CT Angio Abd/Pel w/ and/or w/o Result Date: 11/29/2023 CLINICAL DATA:  Suspected chronic mesenteric ischemia abdominal pain, loose stools, elevated lactate with concern for colitis and possible ischemia EXAM: CTA ABDOMEN AND PELVIS WITHOUT AND WITH CONTRAST TECHNIQUE: Multidetector CT imaging of the abdomen and pelvis was performed using the standard protocol during bolus administration of intravenous contrast. Multiplanar reconstructed images and MIPs were obtained and reviewed to evaluate the vascular anatomy. RADIATION DOSE REDUCTION: This exam was performed according to the departmental dose-optimization program which includes automated exposure control, adjustment of the mA and/or kV according to patient size and/or use of iterative reconstruction technique. CONTRAST:  80mL OMNIPAQUE  IOHEXOL  350 MG/ML SOLN COMPARISON:  November 26, 2023 and prior studies FINDINGS: VASCULAR Aorta: Normal caliber aorta with mild atherosclerotic plaque and without aneurysm, dissection, vasculitis or significant stenosis. Celiac: Patent. Minimal atherosclerotic plaque with no significant stenosis. SMA: Patent. Minimal atherosclerotic plaque with no significant stenosis. Renals: Patent with no significant stenosis. IMA: Patent with no significant stenosis. Inflow: Bilateral common, external, and internal iliac arteries are patent without significant stenosis. Proximal Outflow: Bilateral common femoral and visualized portions of the superficial  and profunda femoral arteries are patent without significant stenosis. Veins: Portal vein, splenic vein, and SMV are patent. No portal venous gas. Image the [facility] veins are patent. Review of the MIP images  confirms the above findings. NON-VASCULAR Lower chest: Trace left pleural effusion. Hepatobiliary: No suspicious liver lesion. No gallstones, gallbladder wall thickening, or biliary dilatation. Pancreas: Unremarkable. Spleen: Unremarkable. Adrenals/Urinary Tract: Adrenal glands are unremarkable. No nephrolithiasis or hydronephrosis. Stomach/Bowel: Nonspecific mild colonic wall thickening involving predominantly the sigmoid colon again identified. Unremarkable appearance of the transverse colon on the study. Appendix is unremarkable. No bowel obstruction. No pneumatosis. Small hiatal hernia. Lymphatic: No lymphadenopathy by size criteria. Reproductive: Unremarkable. Other: No free air or free fluid. Musculoskeletal: Multilevel degenerative vertebral changes. IMPRESSION: VASCULAR Minimal atherosclerotic disease with no arterial aneurysms, dissections, occlusions, or significant stenosis identified. No portal or mesenteric venous thrombosis identified. NON-VASCULAR 1. Improved appearance of the transverse colon on the study. Likely persistent colitis involving the sigmoid colon. Electronically Signed   By: Michaeline Blanch M.D.   On: 11/29/2023 13:06    Medications: I have reviewed the patient's current medications. Prior to Admission:  Medications Prior to Admission  Medication Sig Dispense Refill Last Dose/Taking   acetaminophen  (TYLENOL ) 325 MG tablet Take 650 mg by mouth every 6 (six) hours as needed for mild pain.    Unknown   albuterol  (VENTOLIN  HFA) 108 (90 Base) MCG/ACT inhaler Inhale 2 puffs into the lungs every 6 (six) hours as needed for wheezing or shortness of breath. 18 g 3 Unknown   Cholecalciferol  (VITAMIN D -3) 125 MCG (5000 UT) TABS Take 5,000 Units by mouth daily.   Past Week   Coenzyme Q10 (CO Q 10 PO) Take 1 capsule by mouth daily.   Past Week   Continuous Glucose Sensor (FREESTYLE LIBRE 14 DAY SENSOR) MISC Inject 1 Device into the skin every 14 (fourteen) days.   Past Month   Cyanocobalamin   (B-12 SL) Place 1 tablet under the tongue daily.   Past Week   diclofenac  sodium (VOLTAREN ) 1 % GEL Apply 2 g topically 3 (three) times daily as needed (joint pain- affected areas).   Taking As Needed   donepezil  (ARICEPT ) 5 MG tablet Take 5 mg by mouth at bedtime.   11/25/2023   ezetimibe -simvastatin  (VYTORIN ) 10-20 MG per tablet Take 1 tablet by mouth at bedtime.   11/25/2023   HYDROcodone -acetaminophen  (NORCO/VICODIN) 5-325 MG tablet Take 1 tablet by mouth See admin instructions. Take 1 tablet by mouth with lunch and in the evening   11/26/2023   insulin  glargine (LANTUS  SOLOSTAR) 100 UNIT/ML Solostar Pen Inject 20 Units into the skin at bedtime.   11/25/2023   insulin  lispro (HUMALOG) 100 UNIT/ML KwikPen Inject 2-12 Units into the skin See admin instructions. Inject 2-12 units into the skin three times a day with meals, per sliding scale   11/26/2023   JANUVIA 50 MG tablet Take 50 mg by mouth daily.   11/26/2023   latanoprost  (XALATAN ) 0.005 % ophthalmic solution Place 1 drop into both eyes at bedtime.   11/25/2023   levothyroxine  (SYNTHROID ) 50 MCG tablet Take 1 tablet (50 mcg total) by mouth See admin instructions. Take 25mcg alternating 50mcg every other day. (Patient taking differently: Take 25-50 mcg by mouth See admin instructions. Take 25 mcg by mouth 30 minutes before breakfast ALTERNATING WITH 50 mcg every OTHER morning) 30 tablet 0 11/26/2023   lidocaine  (XYLOCAINE ) 2 % solution Use as directed 3 mLs in the mouth or throat daily as needed for  mouth pain.   Unknown   megestrol  (MEGACE ) 400 MG/10ML suspension Take 600 mg by mouth daily before supper.   11/25/2023   metoprolol  tartrate (LOPRESSOR ) 25 MG tablet Take 1 tablet (25 mg total) by mouth 2 (two) times daily. (Patient taking differently: Take 25 mg by mouth in the morning and at bedtime.) 60 tablet 0 11/26/2023   MYRBETRIQ  25 MG TB24 tablet Take 25 mg by mouth in the morning.   11/26/2023   omeprazole (PRILOSEC) 20 MG capsule Take 20 mg by  mouth every other day.   Past Week   ondansetron  (ZOFRAN ) 4 MG tablet Take 1 tablet (4 mg total) by mouth every 8 (eight) hours as needed for nausea or vomiting. 12 tablet 0 Unknown   SYMBICORT  160-4.5 MCG/ACT inhaler Inhale 2 puffs by mouth twice daily (Patient taking differently: Inhale 2 puffs into the lungs in the morning and at bedtime.) 11 g 0 Unknown   traMADol  (ULTRAM ) 50 MG tablet Take 1 tablet (50 mg total) by mouth every 6 (six) hours as needed. (Patient not taking: Reported on 11/28/2023) 15 tablet 0 Not Taking   Scheduled:  amLODipine   2.5 mg Oral Daily   arformoterol   15 mcg Nebulization BID   budesonide  (PULMICORT ) nebulizer solution  0.5 mg Nebulization BID   cholecalciferol   5,000 Units Oral Daily   donepezil   5 mg Oral QHS   ezetimibe   10 mg Oral q1800   And   simvastatin   20 mg Oral q1800   feeding supplement  237 mL Oral BID BM   insulin  aspart  0-5 Units Subcutaneous QHS   insulin  aspart  0-6 Units Subcutaneous TID WC   insulin  aspart  3 Units Subcutaneous TID WC   insulin  glargine-yfgn  10 Units Subcutaneous Q2200   ipratropium-albuterol   3 mL Nebulization TID   latanoprost   1 drop Both Eyes QHS   levothyroxine   25 mcg Oral Q48H   levothyroxine   50 mcg Oral Q48H   loratadine   10 mg Oral Daily   metoprolol  tartrate  25 mg Oral BID   mirabegron  ER  25 mg Oral Daily   multivitamin with minerals  1 tablet Oral Daily   pantoprazole   40 mg Oral Q0600   phosphorus  250 mg Oral BID   predniSONE   20 mg Oral Q breakfast   sodium chloride  flush  3 mL Intravenous Q12H   Continuous:  cefTRIAXone  (ROCEPHIN )  IV 1 g (11/29/23 2042)   metronidazole  500 mg (11/30/23 0825)    Assessment/Plan: 1) Acute colitis noted on CT scan with abdominal pain and diarrhea-G pathogen panel is negative-continue with IV hydration empiric antibiotics. If the stool studies are negative a colonoscopy will be done. 2) Active asthma with acute exacerbation on steroids and short acting  bronchodilators 3) Type 2 diabetes mellitus 4) Hypertension 5) Chronic kidney disease stage IIIa 6) Hypothyroidism 7) Unintentional weight loss. 8) Iron  deficiency anemia-she has received iron  supplementation IV. 9) History of asthma with wheezing on left side of the chest-chest x-ray and breathing treatment ordered by Dr. Sebastian.   LOS: 4 days   Renaye Sous 11/30/2023, 5:03 PM

## 2023-12-01 DIAGNOSIS — J441 Chronic obstructive pulmonary disease with (acute) exacerbation: Secondary | ICD-10-CM | POA: Diagnosis not present

## 2023-12-01 DIAGNOSIS — K529 Noninfective gastroenteritis and colitis, unspecified: Secondary | ICD-10-CM | POA: Diagnosis not present

## 2023-12-01 DIAGNOSIS — I10 Essential (primary) hypertension: Secondary | ICD-10-CM | POA: Diagnosis not present

## 2023-12-01 DIAGNOSIS — E039 Hypothyroidism, unspecified: Secondary | ICD-10-CM | POA: Diagnosis not present

## 2023-12-01 LAB — CBC WITH DIFFERENTIAL/PLATELET
Abs Immature Granulocytes: 1.54 K/uL — ABNORMAL HIGH (ref 0.00–0.07)
Basophils Absolute: 0 K/uL (ref 0.0–0.1)
Basophils Relative: 0 %
Eosinophils Absolute: 0 K/uL (ref 0.0–0.5)
Eosinophils Relative: 0 %
HCT: 35.2 % — ABNORMAL LOW (ref 36.0–46.0)
Hemoglobin: 11.5 g/dL — ABNORMAL LOW (ref 12.0–15.0)
Immature Granulocytes: 9 %
Lymphocytes Relative: 8 %
Lymphs Abs: 1.4 K/uL (ref 0.7–4.0)
MCH: 30.2 pg (ref 26.0–34.0)
MCHC: 32.7 g/dL (ref 30.0–36.0)
MCV: 92.4 fL (ref 80.0–100.0)
Monocytes Absolute: 0.4 K/uL (ref 0.1–1.0)
Monocytes Relative: 2 %
Neutro Abs: 13.7 K/uL — ABNORMAL HIGH (ref 1.7–7.7)
Neutrophils Relative %: 81 %
Platelets: 326 K/uL (ref 150–400)
RBC: 3.81 MIL/uL — ABNORMAL LOW (ref 3.87–5.11)
RDW: 12.7 % (ref 11.5–15.5)
WBC: 17 K/uL — ABNORMAL HIGH (ref 4.0–10.5)
nRBC: 0.2 % (ref 0.0–0.2)

## 2023-12-01 LAB — CULTURE, BLOOD (ROUTINE X 2)
Culture: NO GROWTH
Culture: NO GROWTH

## 2023-12-01 LAB — BASIC METABOLIC PANEL WITH GFR
Anion gap: 12 (ref 5–15)
BUN: 15 mg/dL (ref 8–23)
CO2: 25 mmol/L (ref 22–32)
Calcium: 8.8 mg/dL — ABNORMAL LOW (ref 8.9–10.3)
Chloride: 100 mmol/L (ref 98–111)
Creatinine, Ser: 0.74 mg/dL (ref 0.44–1.00)
GFR, Estimated: >60 mL/min (ref >60)
Glucose, Bld: 199 mg/dL — ABNORMAL HIGH (ref 70–99)
Potassium: 3.6 mmol/L (ref 3.5–5.1)
Sodium: 137 mmol/L (ref 135–145)

## 2023-12-01 LAB — GLUCOSE, CAPILLARY
Glucose-Capillary: 351 mg/dL — ABNORMAL HIGH (ref 70–99)
Glucose-Capillary: 452 mg/dL — ABNORMAL HIGH (ref 70–99)
Glucose-Capillary: 385 mg/dL — ABNORMAL HIGH (ref 70–99)
Glucose-Capillary: 358 mg/dL — ABNORMAL HIGH (ref 70–99)

## 2023-12-01 LAB — MAGNESIUM: Magnesium: 2 mg/dL (ref 1.7–2.4)

## 2023-12-01 MED ORDER — INSULIN GLARGINE-YFGN 100 UNIT/ML ~~LOC~~ SOLN
4.0000 [IU] | Freq: Once | SUBCUTANEOUS | Status: AC
  Administered 2023-12-01: 4 [IU] via SUBCUTANEOUS
  Filled 2023-12-01: qty 0.04

## 2023-12-01 MED ORDER — INSULIN ASPART 100 UNIT/ML IJ SOLN
8.0000 [IU] | Freq: Three times a day (TID) | INTRAMUSCULAR | Status: DC
  Administered 2023-12-01 – 2023-12-02 (×5): 8 [IU] via SUBCUTANEOUS

## 2023-12-01 MED ORDER — METHYLPREDNISOLONE SODIUM SUCC 40 MG IJ SOLR
40.0000 mg | Freq: Every day | INTRAMUSCULAR | Status: DC
  Filled 2023-12-01: qty 1

## 2023-12-01 MED ORDER — INSULIN GLARGINE-YFGN 100 UNIT/ML ~~LOC~~ SOLN
24.0000 [IU] | Freq: Every day | SUBCUTANEOUS | Status: DC
  Filled 2023-12-01: qty 0.24

## 2023-12-01 MED ORDER — INSULIN GLARGINE-YFGN 100 UNIT/ML ~~LOC~~ SOLN
30.0000 [IU] | Freq: Every day | SUBCUTANEOUS | Status: DC
  Administered 2023-12-01 – 2023-12-02 (×2): 30 [IU] via SUBCUTANEOUS
  Filled 2023-12-01 (×2): qty 0.3

## 2023-12-01 NOTE — Progress Notes (Addendum)
 PROGRESS NOTE    Hannah [name]  FMW:[id number] DOB: Nov 22, 1932 DOA: 11/26/2023 PCP: Sun, Vyvyan, MD   Chief Complaint  Patient presents with   Abdominal Pain    Brief Narrative:  Per HPI:  Hannah [name] is an 89 y.o. female with medical history significant for buccal cancer status postresection and radiation, hypertension, hypothyroidism, type 2 diabetes mellitus, chronic lumbar radiculopathy, asthma, and CKD 3A who presents with abdominal pain.   Patient complains of pain across her upper abdomen.  She also reports experiencing diarrhea every time she sits down to urinate.  She denies vomiting.  She also notes feeling short of breath recently but denies any significant cough.   Assessment & Plan:   Principal Problem:   Colitis Active Problems:   Asthma, chronic obstructive, with acute exacerbation (HCC)   Hypothyroidism   Hypertension   Insulin  dependent type 2 diabetes mellitus (HCC)   CKD stage 3a, GFR 45-59 ml/min (HCC)   Iron  deficiency anemia   #1 colitis -Patient presented with abdominal pain, noted to have diarrhea prior to admission. - CT abdomen and pelvis done concerning for colitis. -Per RN patient with decreased volume of stool with improvement with consistency. - GI pathogen panel negative. - Per RN C. difficile unable to be obtained as patient with no further watery diarrhea with improvement with stool consistency. - Continue IV Rocephin , IV Flagyl  and can likely transition to oral antibiotics on discharge to complete a 7-day course of treatment.  -Saline lock IV fluids.   - Per GI.   2.  Acute asthma exacerbation versus COPD exacerbation -Noted with wheezing on examination on 11/30/2023. - Chest x-ray obtained negative for any acute infiltrate. -Improving clinically. - Discontinued Breo Ellipta . - Continue Brovana , Pulmicort , scheduled DuoNebs. - Currently on IV Solu-Medrol  40 mg IV every 12 hours which we will decrease to IV Solu-Medrol  60 mg daily.   - Continue Claritin , Flonase, PPI.   3.  Diabetes mellitus type 2 -Hemoglobin A1c noted at 10.2. - CBG elevated at 351 this morning, secondary to IV steroids.   - Increase Semglee  to 30 units daily.  - Increase meal coverage NovoLog  to 8 units 3 times daily with meals.  - Monitor CBGs with steroid taper.  - Continue SSI.   4.  Hypertension - Continue metoprolol . - Continue Norvasc  2.5 mg daily.    5.  CKD 3A -Stable.  6.  Hypothyroidism - Synthroid .   7.  Normocytic anemia/iron  deficiency anemia -Hemoglobin currently stable at 11.5. - Status post IV iron .  8.  Urinary incontinence -Family requesting urology assessment for patient's urinary incontinence which has worsened over the past 2 months affecting the quality of life. - Patient currently on Myrbetriq  25 mg daily. ??  Increase dose to 50 mg daily. - Consult with urology for further evaluation and management per family request.   DVT prophylaxis: SCDs Code Status: Full Family Communication: Updated patient and daughter at bedside. Disposition: Home when clinically improved hopefully in the next 24 to 48 hours for  Status is: Inpatient Remains inpatient appropriate because: Severity of illness   Consultants:  Gastroenterology: Dr. Kristie 11/28/2023  Procedures:  CT angiogram chest 11/26/2023 CT abdomen and pelvis 11/26/2023 CT angiogram abdomen and pelvis 11/29/2023 Chest x-ray 11/26/2023, 11/30/2023  Antimicrobials:  Anti-infectives (From admission, onward)    Start     Dose/Rate Route Frequency Ordered Stop   12/01/23 0800  cefTRIAXone  (ROCEPHIN ) 2 g in sodium chloride  0.9 % 100 mL IVPB  2 g 200 mL/hr over 30 Minutes Intravenous Every 24 hours 11/30/23 2133 12/04/23 0759   11/27/23 2000  ceFEPIme  (MAXIPIME ) 2 g in sodium chloride  0.9 % 100 mL IVPB  Status:  Discontinued        2 g 200 mL/hr over 30 Minutes Intravenous Every 24 hours 11/27/23 0100 11/27/23 0841   11/27/23 2000  cefTRIAXone  (ROCEPHIN ) 1 g  in sodium chloride  0.9 % 100 mL IVPB       Note to Pharmacy: Need assistance with timing, given cefepime  dose at 2000 7/12   1 g 200 mL/hr over 30 Minutes Intravenous Every 24 hours 11/27/23 0841 11/30/23 2104   11/27/23 0900  metroNIDAZOLE  (FLAGYL ) IVPB 500 mg        500 mg 100 mL/hr over 60 Minutes Intravenous Every 12 hours 11/27/23 0038 12/04/23 0859   11/26/23 2000  aztreonam (AZACTAM) 2 g in sodium chloride  0.9 % 100 mL IVPB  Status:  Discontinued        2 g 200 mL/hr over 30 Minutes Intravenous  Once 11/26/23 1948 11/26/23 1957   11/26/23 2000  metroNIDAZOLE  (FLAGYL ) IVPB 500 mg        500 mg 100 mL/hr over 60 Minutes Intravenous  Once 11/26/23 1948 11/26/23 2220   11/26/23 2000  vancomycin  (VANCOCIN ) IVPB 1000 mg/200 mL premix        1,000 mg 200 mL/hr over 60 Minutes Intravenous  Once 11/26/23 1948 11/26/23 2326   11/26/23 2000  cefTRIAXone  (ROCEPHIN ) 2 g in sodium chloride  0.9 % 100 mL IVPB  Status:  Discontinued        2 g 200 mL/hr over 30 Minutes Intravenous  Once 11/26/23 1957 11/26/23 1959   11/26/23 2000  ceFEPIme  (MAXIPIME ) 2 g in sodium chloride  0.9 % 100 mL IVPB        2 g 200 mL/hr over 30 Minutes Intravenous  Once 11/26/23 1959 11/26/23 2126         Subjective: Patient lying in bed.  Daughter at bedside.  Patient denies any chest pain.  Patient states some improvement with shortness of breath and wheezing.  Patient denies any abdominal pain.  Per RN patient with 1 stool this morning which was mostly and not watery.  Daughter at bedside.  Daughter requesting urological evaluation due to recent urinary incontinence over the past 2 months which has been affecting patient's quality of life and.  Objective: Vitals:   11/30/23 1140 11/30/23 2151 11/30/23 2247 12/01/23 0415  BP: (!) 168/78 (!) 151/77  (!) 169/78  Pulse: 72 (!) 128 (!) 101 92  Resp: 19     Temp: 99.1 F (37.3 C) 98.7 F (37.1 C)  99 F (37.2 C)  TempSrc: Oral Oral  Oral  SpO2: 98% 96% 97% 98%   Weight:      Height:        Intake/Output Summary (Last 24 hours) at 12/01/2023 1108 Last data filed at 12/01/2023 0556 Gross per 24 hour  Intake 784.51 ml  Output 1100 ml  Net -315.49 ml   Filed Weights   11/26/23 2131  Weight: 44.8 kg    Examination:  General exam: NAD. Respiratory system: Decreased diffuse wheezing.  No significant crackles noted.  No significant rhonchi.  Fair air movement.  Cardiovascular system: RRR no murmurs rubs or gallops.  No JVD.  No pitting lower extremity edema.  Gastrointestinal system: Abdomen soft, nontender, nondistended, positive bowel sounds.  No rebound.  No guarding. Central nervous system: Alert and oriented.  No focal neurological deficits. Extremities: Symmetric 5 x 5 power. Skin: No rashes, lesions or ulcers Psychiatry: Judgement and insight appear normal. Mood & affect appropriate.     Data Reviewed: I have personally reviewed following labs and imaging studies  CBC: Recent Labs  Lab 11/26/23 1937 11/27/23 0259 11/28/23 0455 11/28/23 1911 11/30/23 0535 12/01/23 0513  WBC 15.8* 18.5* 20.1* 18.4* 15.4* 17.0*  NEUTROABS 13.1*  --  16.8* 15.7* 11.1* 13.7*  HGB 12.3 10.0* 9.5* 10.0* 11.1* 11.5*  HCT 38.3 32.2* 30.1* 30.8* 34.3* 35.2*  MCV 94.6 97.6 96.5 93.3 91.0 92.4  PLT 262 234 255 281 296 326    Basic Metabolic Panel: Recent Labs  Lab 11/27/23 0259 11/28/23 0455 11/29/23 0453 11/30/23 0535 12/01/23 0513  NA 138 138 136 133* 137  K 5.1 4.0 3.4* 3.7 3.6  CL 107 105 103 98 100  CO2 22 21* 25 25 25   GLUCOSE 369* 184* 119* 106* 199*  BUN 17 20 19 17 15   CREATININE 0.96 0.89 0.82 0.69 0.74  CALCIUM  8.4* 8.9 9.0 8.9 8.8*  MG  --   --  1.9 2.0 2.0  PHOS  --   --   --  2.4*  --     GFR: Estimated Creatinine Clearance: 34.4 mL/min (by C-G formula based on SCr of 0.74 mg/dL).  Liver Function Tests: Recent Labs  Lab 11/26/23 1937 11/28/23 0455  AST 21 15  ALT 14 12  ALKPHOS 49 27*  BILITOT 0.5 0.5  PROT 7.5  5.5*  ALBUMIN 4.2 2.7*    CBG: Recent Labs  Lab 11/30/23 0748 11/30/23 1135 11/30/23 1639 11/30/23 2151 12/01/23 0750  GLUCAP 117* 255* 304* 293* 351*     Recent Results (from the past 240 hours)  Resp panel by RT-PCR (RSV, Flu A&B, Covid) Anterior Nasal Swab     Status: None   Collection Time: 11/26/23  7:37 PM   Specimen: Anterior Nasal Swab  Result Value Ref Range Status   SARS Coronavirus 2 by RT PCR NEGATIVE NEGATIVE Final    Comment: (NOTE) SARS-CoV-2 target nucleic acids are NOT DETECTED.  The SARS-CoV-2 RNA is generally detectable in upper respiratory specimens during the acute phase of infection. The lowest concentration of SARS-CoV-2 viral copies this assay can detect is 138 copies/mL. A negative result does not preclude SARS-Cov-2 infection and should not be used as the sole basis for treatment or other patient management decisions. A negative result may occur with  improper specimen collection/handling, submission of specimen other than nasopharyngeal swab, presence of viral mutation(s) within the areas targeted by this assay, and inadequate number of viral copies(<138 copies/mL). A negative result must be combined with clinical observations, patient history, and epidemiological information. The expected result is Negative.  Fact Sheet for Patients:  BloggerCourse.com  Fact Sheet for Healthcare Providers:  SeriousBroker.it  This test is no t yet approved or cleared by the [place]  FDA and  has been authorized for detection and/or diagnosis of SARS-CoV-2 by FDA under an Emergency Use Authorization (EUA). This EUA will remain  in effect (meaning this test can be used) for the duration of the COVID-19 declaration under Section 564(b)(1) of the Act, 21 U.S.C.section 360bbb-3(b)(1), unless the authorization is terminated  or revoked sooner.       Influenza A by PCR NEGATIVE NEGATIVE Final   Influenza B  by PCR NEGATIVE NEGATIVE Final    Comment: (NOTE) The Xpert Xpress SARS-CoV-2/FLU/RSV plus assay is intended as an aid in the  diagnosis of influenza from Nasopharyngeal swab specimens and should not be used as a sole basis for treatment. Nasal washings and aspirates are unacceptable for Xpert Xpress SARS-CoV-2/FLU/RSV testing.  Fact Sheet for Patients: BloggerCourse.com  Fact Sheet for Healthcare Providers: SeriousBroker.it  This test is not yet approved or cleared by the [place]  FDA and has been authorized for detection and/or diagnosis of SARS-CoV-2 by FDA under an Emergency Use Authorization (EUA). This EUA will remain in effect (meaning this test can be used) for the duration of the COVID-19 declaration under Section 564(b)(1) of the Act, 21 U.S.C. section 360bbb-3(b)(1), unless the authorization is terminated or revoked.     Resp Syncytial Virus by PCR NEGATIVE NEGATIVE Final    Comment: (NOTE) Fact Sheet for Patients: BloggerCourse.com  Fact Sheet for Healthcare Providers: SeriousBroker.it  This test is not yet approved or cleared by the [place]  FDA and has been authorized for detection and/or diagnosis of SARS-CoV-2 by FDA under an Emergency Use Authorization (EUA). This EUA will remain in effect (meaning this test can be used) for the duration of the COVID-19 declaration under Section 564(b)(1) of the Act, 21 U.S.C. section 360bbb-3(b)(1), unless the authorization is terminated or revoked.  Performed at [facility], [address], [place], [place] [address]   Blood Culture (routine x 2)     Status: None   Collection Time: 11/26/23  7:48 PM   Specimen: BLOOD  Result Value Ref Range Status   Specimen Description   Final    BLOOD RIGHT ANTECUBITAL Performed at [facility], [address] [place], [place]  [address]    Special Requests   Final    BOTTLES DRAWN AEROBIC AND ANAEROBIC Blood Culture results may not be optimal due to an inadequate volume of blood received in culture bottles   Culture   Final    NO GROWTH 5 DAYS Performed at [facility], [address] [place], [place] [address]    Report Status 12/01/2023 FINAL  Final  Blood Culture (routine x 2)     Status: None   Collection Time: 11/26/23  8:09 PM   Specimen: BLOOD  Result Value Ref Range Status   Specimen Description   Final    BLOOD LEFT ANTECUBITAL Performed at [facility], [address] [place], [place] [address]    Special Requests   Final    BOTTLES DRAWN AEROBIC AND ANAEROBIC Blood Culture results may not be optimal due to an inadequate volume of blood received in culture bottles Performed at [facility], [address], [place], [place] [address]    Culture   Final    NO GROWTH 5 DAYS Performed at [facility], [address] [place], [place] [address]    Report Status 12/01/2023 FINAL  Final  Respiratory (~20 pathogens) panel by PCR     Status: None   Collection Time: 11/27/23 10:40 PM   Specimen: Nasopharyngeal Swab; Respiratory  Result Value Ref Range Status   Adenovirus NOT DETECTED NOT DETECTED Final   Coronavirus 229E NOT DETECTED NOT DETECTED Final    Comment: (NOTE) The Coronavirus on the Respiratory Panel, DOES NOT test for the novel  Coronavirus (2019 nCoV)    Coronavirus HKU1 NOT DETECTED NOT DETECTED Final   Coronavirus NL63 NOT DETECTED NOT DETECTED Final   Coronavirus OC43 NOT DETECTED NOT DETECTED Final   Metapneumovirus NOT DETECTED NOT DETECTED Final   Rhinovirus / Enterovirus NOT DETECTED NOT DETECTED Final  Influenza A NOT DETECTED NOT DETECTED Final   Influenza B NOT DETECTED NOT DETECTED Final   Parainfluenza Virus 1 NOT DETECTED NOT DETECTED Final   Parainfluenza Virus 2 NOT DETECTED NOT DETECTED Final   Parainfluenza Virus 3  NOT DETECTED NOT DETECTED Final   Parainfluenza Virus 4 NOT DETECTED NOT DETECTED Final   Respiratory Syncytial Virus NOT DETECTED NOT DETECTED Final   Bordetella pertussis NOT DETECTED NOT DETECTED Final   Bordetella Parapertussis NOT DETECTED NOT DETECTED Final   Chlamydophila pneumoniae NOT DETECTED NOT DETECTED Final   Mycoplasma pneumoniae NOT DETECTED NOT DETECTED Final    Comment: Performed at [facility], [address] [place], [place] [address]  Gastrointestinal Panel by PCR , Stool     Status: None   Collection Time: 11/29/23  1:18 PM   Specimen: Stool  Result Value Ref Range Status   Campylobacter species NOT DETECTED NOT DETECTED Final   Plesimonas shigelloides NOT DETECTED NOT DETECTED Final   Salmonella species NOT DETECTED NOT DETECTED Final   Yersinia enterocolitica NOT DETECTED NOT DETECTED Final   Vibrio species NOT DETECTED NOT DETECTED Final   Vibrio cholerae NOT DETECTED NOT DETECTED Final   Enteroaggregative E coli (EAEC) NOT DETECTED NOT DETECTED Final   Enteropathogenic E coli (EPEC) NOT DETECTED NOT DETECTED Final   Enterotoxigenic E coli (ETEC) NOT DETECTED NOT DETECTED Final   Shiga like toxin producing E coli (STEC) NOT DETECTED NOT DETECTED Final   Shigella/Enteroinvasive E coli (EIEC) NOT DETECTED NOT DETECTED Final   Cryptosporidium NOT DETECTED NOT DETECTED Final   Cyclospora cayetanensis NOT DETECTED NOT DETECTED Final   Entamoeba histolytica NOT DETECTED NOT DETECTED Final   Giardia lamblia NOT DETECTED NOT DETECTED Final   Adenovirus F40/41 NOT DETECTED NOT DETECTED Final   Astrovirus NOT DETECTED NOT DETECTED Final   Norovirus GI/GII NOT DETECTED NOT DETECTED Final   Rotavirus A NOT DETECTED NOT DETECTED Final   Sapovirus (I, II, IV, and V) NOT DETECTED NOT DETECTED Final    Comment: Performed at [facility], [address], [place], [place] [address]         Radiology Studies: DG CHEST PORT 1 VIEW Result Date:  11/30/2023 CLINICAL DATA:  Wheezing.  History of colitis EXAM: PORTABLE CHEST 1 VIEW COMPARISON:  Chest x-ray and CTA chest 11/26/2023. FINDINGS: Hyperinflation. Film is rotated to the left. No consolidation,. Normal cardiopericardial silhouette. Calcifications along the tracheobronchial tree. Some kyphosis. IMPRESSION: Hyperinflation. No acute cardiopulmonary disease. Rotated radiograph. Electronically Signed   By: Ranell Bring M.D.   On: 11/30/2023 18:58        Scheduled Meds:  amLODipine   2.5 mg Oral Daily   arformoterol   15 mcg Nebulization BID   budesonide  (PULMICORT ) nebulizer solution  0.5 mg Nebulization BID   cholecalciferol   5,000 Units Oral Daily   donepezil   5 mg Oral QHS   ezetimibe   10 mg Oral q1800   And   simvastatin   20 mg Oral q1800   feeding supplement  237 mL Oral BID BM   insulin  aspart  0-5 Units Subcutaneous QHS   insulin  aspart  0-6 Units Subcutaneous TID WC   insulin  aspart  8 Units Subcutaneous TID WC   insulin  glargine-yfgn  24 Units Subcutaneous Q2200   ipratropium-albuterol   3 mL Nebulization TID   latanoprost   1 drop Both Eyes QHS   levothyroxine   25 mcg Oral Q48H   levothyroxine   50 mcg Oral Q48H   loratadine   10  mg Oral Daily   methylPREDNISolone  (SOLU-MEDROL ) injection  40 mg Intravenous Q12H   metoprolol  tartrate  25 mg Oral BID   mirabegron  ER  25 mg Oral Daily   multivitamin with minerals  1 tablet Oral Daily   pantoprazole   40 mg Oral Q0600   phosphorus  250 mg Oral BID   sodium chloride  flush  3 mL Intravenous Q12H   Continuous Infusions:  cefTRIAXone  (ROCEPHIN )  IV 2 g (12/01/23 0809)   metronidazole  500 mg (12/01/23 0940)     LOS: 5 days    Time spent: 40 minutes    Toribio Hummer, MD [facility]   To contact the attending provider between 7A-7P or the covering provider during after hours 7P-7A, please log into the web site [email or web address] and access using universal [place] password for that web site. If you do not  have the password, please call the hospital operator.  12/01/2023, 11:08 AM

## 2023-12-01 NOTE — Consult Note (Signed)
 Urology Consult Note   Requesting Attending Physician:  Sebastian Toribio GAILS, MD Service Providing Consult: Urology  Consulting Attending: Dr. Devere   Reason for Consult:  urinary incontinence  HPI: Hannah [name] is seen in consultation for reasons noted above at the request of Sebastian Toribio GAILS, MD. patient is an 89 year old female initially presenting to [facility] department with complaints of abdominal pain and diarrhea every time she sits down to urinate.  She was found to have slightly elevated creatinine and blood glucose of 281 on arrival.  She was transferred to [facility] for admission and further workup.  CT A/P was suggestive of acute colitis.  She is being followed by Dr. Kristie of GI.   [facility] is consulted to address urinary incontinence, reportedly at the insistence of family.  Patient is known to our practice and was followed by Dr. Elisabeth.  Last seen 07/24/2020.  She was reliant on incontinence briefs and experiencing severe urgency and incontinence at least as far back as 2022.  She remains on 25 mg daily Myrbetriq  as prescribed by her PCP.  She has been on this for several years and does not appreciate any benefit.  Conversation was primarily had with her daughter, her primary caretaker, as patient does not speak English ------------------  Assessment:   89 y.o. female with urinary incontinence, OAB   Recommendations: # Urinary incontinence  She would benefit from urodynamic studies.  This equipment is not available in hospital.  Daughter is amenable to reestablishing with our practice and following up on an outpatient basis.  She will continue wearing incontinence briefs for the time being.  Dr. Glendia MacDiarmid specializes in this field.    Signing off.  Urology will not need to follow.   Case and plan discussed with Dr. Devere and Dr. Vicci  Past Medical History: Past Medical History:  Diagnosis Date   Depression    Diabetes  mellitus    Hypercholesteremia    Hypertension    Hypothyroidism    Oral cancer (HCC) 2012   surgery and radiation   Spinal stenosis    Tuberculosis    1978   Weight loss, unintentional     Past Surgical History:  Past Surgical History:  Procedure Laterality Date   AORTIC ARCH ANGIOGRAPHY  09/2019   BACK SURGERY     COLONOSCOPY     EYE SURGERY Bilateral    cataracts   LIPOMA EXCISION     back   LUMBAR LAMINECTOMY/DECOMPRESSION MICRODISCECTOMY Right 04/12/2018   Procedure: Laminectomy and Foraminotomy - Lumbar four-Lumbar five - Lumbar five-Sacral one - right;  Surgeon: Joshua Alm RAMAN, MD;  Location: [facility] OR;  Service: Neurosurgery;  Laterality: Right;   LUMBAR SPINE SURGERY  05/2019   MASS EXCISION Left 04/02/2019   Procedure: EXCISION OF LIPOMATOUS MASS OF THE LEFT BACK;  Surgeon: Gladis Cough, MD;  Location: [facility];  Service: General;  Laterality: Left;   OTHER SURGICAL HISTORY     squamous cell removal;skin graft   RIGHT/LEFT HEART CATH AND CORONARY ANGIOGRAPHY N/A 09/18/2019   Procedure: RIGHT/LEFT HEART CATH AND CORONARY ANGIOGRAPHY;  Surgeon: Ladona Heinz, MD;  Location: [facility];  Service: Cardiovascular;  Laterality: N/A;   TOOTH EXTRACTION     all pulled    Medication: Current Facility-Administered Medications  Medication Dose Route Frequency Provider Last Rate Last Admin   acetaminophen  (TYLENOL ) tablet 650 mg  650 mg Oral Q6H PRN Opyd, Timothy S, MD  650 mg at 11/30/23 0545   Or   acetaminophen  (TYLENOL ) suppository 650 mg  650 mg Rectal Q6H PRN Opyd, Timothy S, MD       albuterol  (PROVENTIL ) (2.5 MG/3ML) 0.083% nebulizer solution 2.5 mg  2.5 mg Nebulization Q2H PRN Opyd, Timothy S, MD       amLODipine  (NORVASC ) tablet 2.5 mg  2.5 mg Oral Daily Sebastian Toribio GAILS, MD   2.5 mg at 12/01/23 1054   arformoterol  (BROVANA ) nebulizer solution 15 mcg  15 mcg Nebulization BID Sebastian Toribio GAILS, MD   15 mcg at 12/01/23 9379   budesonide   (PULMICORT ) nebulizer solution 0.5 mg  0.5 mg Nebulization BID Sebastian Toribio GAILS, MD   0.5 mg at 12/01/23 9379   cefTRIAXone  (ROCEPHIN ) 2 g in sodium chloride  0.9 % 100 mL IVPB  2 g Intravenous Q24H Sebastian Toribio GAILS, MD 200 mL/hr at 12/01/23 0809 2 g at 12/01/23 0809   cholecalciferol  (VITAMIN D3) tablet 5,000 Units  5,000 Units Oral Daily Sebastian Toribio GAILS, MD   5,000 Units at 12/01/23 1054   diclofenac  Sodium (VOLTAREN ) 1 % topical gel 2 g  2 g Topical TID PRN Franchot Novel, MD       donepezil  (ARICEPT ) tablet 5 mg  5 mg Oral QHS Sebastian Toribio GAILS, MD   5 mg at 11/30/23 2126   ezetimibe  (ZETIA ) tablet 10 mg  10 mg Oral q1800 Madueme, Elvira C, RPH   10 mg at 11/30/23 1758   And   simvastatin  (ZOCOR ) tablet 20 mg  20 mg Oral q1800 Madueme, Elvira C, RPH   20 mg at 11/30/23 1758   feeding supplement (ENSURE PLUS HIGH PROTEIN) liquid 237 mL  237 mL Oral BID BM Franchot Novel, MD   237 mL at 12/01/23 1058   fentaNYL  (SUBLIMAZE ) injection 12.5-50 mcg  12.5-50 mcg Intravenous Q2H PRN Opyd, Timothy S, MD       insulin  aspart (novoLOG ) injection 0-5 Units  0-5 Units Subcutaneous QHS Opyd, Timothy S, MD   3 Units at 11/30/23 2201   insulin  aspart (novoLOG ) injection 0-6 Units  0-6 Units Subcutaneous TID WC Opyd, Timothy S, MD   6 Units at 12/01/23 1209   insulin  aspart (novoLOG ) injection 8 Units  8 Units Subcutaneous TID WC Sebastian Toribio GAILS, MD   8 Units at 12/01/23 1209   insulin  glargine-yfgn (SEMGLEE ) injection 24 Units  24 Units Subcutaneous Q2200 Sebastian Toribio GAILS, MD       ipratropium-albuterol  (DUONEB) 0.5-2.5 (3) MG/3ML nebulizer solution 3 mL  3 mL Nebulization TID Sebastian Toribio GAILS, MD   3 mL at 12/01/23 0620   latanoprost  (XALATAN ) 0.005 % ophthalmic solution 1 drop  1 drop Both Eyes QHS Opyd, Timothy S, MD   1 drop at 11/30/23 2127   levothyroxine  (SYNTHROID ) tablet 25 mcg  25 mcg Oral Q48H Opyd, Timothy S, MD   25 mcg at 11/30/23 0546   levothyroxine  (SYNTHROID ) tablet 50 mcg   50 mcg Oral Q48H Opyd, Timothy S, MD   50 mcg at 12/01/23 0555   loratadine  (CLARITIN ) tablet 10 mg  10 mg Oral Daily Sebastian Toribio GAILS, MD   10 mg at 12/01/23 1054   methylPREDNISolone  sodium succinate (SOLU-MEDROL ) 40 mg/mL injection 40 mg  40 mg Intravenous Q12H Sebastian Toribio GAILS, MD   40 mg at 12/01/23 1051   metoprolol  tartrate (LOPRESSOR ) tablet 25 mg  25 mg Oral BID Keith, Kayla N, PA-C   25 mg at 12/01/23 1054   metroNIDAZOLE  (  FLAGYL ) IVPB 500 mg  500 mg Intravenous Q12H Opyd, Timothy S, MD 100 mL/hr at 12/01/23 0940 500 mg at 12/01/23 0940   mirabegron  ER (MYRBETRIQ ) tablet 25 mg  25 mg Oral Daily Opyd, Timothy S, MD   25 mg at 12/01/23 1114   multivitamin with minerals tablet 1 tablet  1 tablet Oral Daily Franchot Novel, MD   1 tablet at 12/01/23 1054   ondansetron  (ZOFRAN ) tablet 4 mg  4 mg Oral Q6H PRN Opyd, Timothy S, MD       Or   ondansetron  (ZOFRAN ) injection 4 mg  4 mg Intravenous Q6H PRN Opyd, Timothy S, MD       oxyCODONE  (Oxy IR/ROXICODONE ) immediate release tablet 2.5 mg  2.5 mg Oral Q4H PRN Opyd, Timothy S, MD       pantoprazole  (PROTONIX ) EC tablet 40 mg  40 mg Oral Q0600 Sebastian Toribio GAILS, MD   40 mg at 12/01/23 0555   phosphorus (K PHOS  NEUTRAL) tablet 250 mg  250 mg Oral BID Sebastian Toribio GAILS, MD   250 mg at 12/01/23 1054   sodium chloride  flush (NS) 0.9 % injection 3 mL  3 mL Intravenous Q12H Opyd, Evalene RAMAN, MD   3 mL at 12/01/23 1058    Allergies: Allergies  Allergen Reactions   Penicillins Rash and Other (See Comments)    1980's   Sulfa Antibiotics Rash    Social History: Social History   Tobacco Use   Smoking status: Never   Smokeless tobacco: Never  Vaping Use   Vaping status: Never Used  Substance Use Topics   Alcohol use: No    Alcohol/week: 0.0 standard drinks of alcohol   Drug use: No    Family History Family History  Problem Relation Age of Onset   Arthritis Mother        Late 90s   Heart attack Father        Deceased, 31    Heart disease Brother    Hypertension Other    Hyperlipidemia Other    Diabetes type II Sister    Diabetes Brother     Review of Systems  Genitourinary:  Positive for frequency and urgency.     Objective   Vital signs in last 24 hours: BP (!) 163/88 (BP Location: Left Arm)   Pulse 92   Temp 99 F (37.2 C) (Oral)   Resp 20   Ht 5' 2 (1.575 m)   Wt 44.8 kg   SpO2 98%   BMI 18.06 kg/m   Physical Exam General: A&O, resting, appropriate HEENT: [place]/AT Pulmonary: Normal work of breathing Cardiovascular: no cyanosis Abdomen: Soft, NTTP, non-distended GU: Shared decision made to defer until outpatient follow-up.  No benefit at this time Neuro: Appropriate, no focal neurological deficits  Most Recent Labs: Lab Results  Component Value Date   WBC 17.0 (H) 12/01/2023   HGB 11.5 (L) 12/01/2023   HCT 35.2 (L) 12/01/2023   PLT 326 12/01/2023    Lab Results  Component Value Date   NA 137 12/01/2023   K 3.6 12/01/2023   CL 100 12/01/2023   CO2 25 12/01/2023   BUN 15 12/01/2023   CREATININE 0.74 12/01/2023   CALCIUM  8.8 (L) 12/01/2023   MG 2.0 12/01/2023   PHOS 2.4 (L) 11/30/2023    Lab Results  Component Value Date   INR 1.1 11/26/2023     Urine Culture: @[id number] (laburin,org,[id number])@   IMAGING: DG CHEST PORT 1 VIEW Result Date: 11/30/2023 CLINICAL DATA:  Wheezing.  History of colitis EXAM: PORTABLE CHEST 1 VIEW COMPARISON:  Chest x-ray and CTA chest 11/26/2023. FINDINGS: Hyperinflation. Film is rotated to the left. No consolidation,. Normal cardiopericardial silhouette. Calcifications along the tracheobronchial tree. Some kyphosis. IMPRESSION: Hyperinflation. No acute cardiopulmonary disease. Rotated radiograph. Electronically Signed   By: Ranell Bring M.D.   On: 11/30/2023 18:58    ------  Ole Bourdon, NP Pager: [phone]   Please contact the urology consult pager with any further questions/concerns.

## 2023-12-01 NOTE — Inpatient Diabetes Management (Addendum)
 Inpatient Diabetes Program Recommendations  AACE/ADA: New Consensus Statement on Inpatient Glycemic Control (2015)  Target Ranges:  Prepandial:   less than 140 mg/dL      Peak postprandial:   less than 180 mg/dL (1-2 hours)      Critically ill patients:  140 - 180 mg/dL   Lab Results  Component Value Date   GLUCAP 351 (H) 12/01/2023   HGBA1C 10.2 (H) 11/27/2023    Review of Glycemic Control  Latest Reference Range & Units 11/30/23 07:48 11/30/23 11:35 11/30/23 16:39 11/30/23 21:51 12/01/23 07:50  Glucose-Capillary 70 - 99 mg/dL 882 (H) 744 (H) 695 (H) 293 (H) 351 (H)  (H): Data is abnormally high  Diabetes history: DM2 Outpatient Diabetes medications: Lantus  20 units every day, Lispro 2-8 units TID and Januvia 50 mg every day, Freestyle Libre 14 day Current orders for Inpatient glycemic control: Semglee  20 units every day, Novolog  0-6 units TID and 0-5 units at bedtime  Inpatient Diabetes Program Recommendations:    Please consider:  Semglee  25 units every day Novolog  3 units TID with meals if she consumes at least 50%  Met with patient and daughter at bedside.  Reviewed patient's current A1c of 10.2%. Explained what a A1c is and what it measures. Also reviewed goal A1c with patient, importance of good glucose control @ home, and blood sugar goals.  Daughter has been struggling to get her blood sugars down at home.  They have been consistently > 300 mg/dL and sometimes just reading HI on her CGM.  Explained impact of infection and steroids on blood glucose.  Ms. Devargas does not drink any caloric drinks at home and follows a healthy diet.  Her daughter is her caregiver.  Daughter states her A1C was 10% in January as well.  May need to increase her basal insulin  and add meal coverage TID at DC.   Thank you, Wyvonna Pinal, MSN, CDCES Diabetes Coordinator Inpatient Diabetes Program [phone] (team pager from 8a-5p)

## 2023-12-02 DIAGNOSIS — E039 Hypothyroidism, unspecified: Secondary | ICD-10-CM | POA: Diagnosis not present

## 2023-12-02 DIAGNOSIS — I10 Essential (primary) hypertension: Secondary | ICD-10-CM | POA: Diagnosis not present

## 2023-12-02 DIAGNOSIS — K529 Noninfective gastroenteritis and colitis, unspecified: Secondary | ICD-10-CM | POA: Diagnosis not present

## 2023-12-02 DIAGNOSIS — J441 Chronic obstructive pulmonary disease with (acute) exacerbation: Secondary | ICD-10-CM | POA: Diagnosis not present

## 2023-12-02 LAB — CBC WITH DIFFERENTIAL/PLATELET
Abs Immature Granulocytes: 1.75 K/uL — ABNORMAL HIGH (ref 0.00–0.07)
Basophils Absolute: 0.2 K/uL — ABNORMAL HIGH (ref 0.0–0.1)
Basophils Relative: 1 %
Eosinophils Absolute: 0 K/uL (ref 0.0–0.5)
Eosinophils Relative: 0 %
HCT: 33.2 % — ABNORMAL LOW (ref 36.0–46.0)
Hemoglobin: 10.8 g/dL — ABNORMAL LOW (ref 12.0–15.0)
Immature Granulocytes: 8 %
Lymphocytes Relative: 9 %
Lymphs Abs: 1.9 K/uL (ref 0.7–4.0)
MCH: 30.4 pg (ref 26.0–34.0)
MCHC: 32.5 g/dL (ref 30.0–36.0)
MCV: 93.5 fL (ref 80.0–100.0)
Monocytes Absolute: 1.6 K/uL — ABNORMAL HIGH (ref 0.1–1.0)
Monocytes Relative: 8 %
Neutro Abs: 15.5 K/uL — ABNORMAL HIGH (ref 1.7–7.7)
Neutrophils Relative %: 74 %
Platelets: 307 K/uL (ref 150–400)
RBC: 3.55 MIL/uL — ABNORMAL LOW (ref 3.87–5.11)
RDW: 12.9 % (ref 11.5–15.5)
WBC: 20.8 K/uL — ABNORMAL HIGH (ref 4.0–10.5)
nRBC: 0.2 % (ref 0.0–0.2)

## 2023-12-02 LAB — GLUCOSE, CAPILLARY
Glucose-Capillary: 83 mg/dL (ref 70–99)
Glucose-Capillary: 110 mg/dL — ABNORMAL HIGH (ref 70–99)
Glucose-Capillary: 196 mg/dL — ABNORMAL HIGH (ref 70–99)
Glucose-Capillary: 226 mg/dL — ABNORMAL HIGH (ref 70–99)

## 2023-12-02 LAB — BASIC METABOLIC PANEL WITH GFR
Anion gap: 11 (ref 5–15)
BUN: 17 mg/dL (ref 8–23)
CO2: 26 mmol/L (ref 22–32)
Calcium: 8.7 mg/dL — ABNORMAL LOW (ref 8.9–10.3)
Chloride: 103 mmol/L (ref 98–111)
Creatinine, Ser: 0.81 mg/dL (ref 0.44–1.00)
GFR, Estimated: >60 mL/min (ref >60)
Glucose, Bld: 91 mg/dL (ref 70–99)
Potassium: 3.1 mmol/L — ABNORMAL LOW (ref 3.5–5.1)
Sodium: 140 mmol/L (ref 135–145)

## 2023-12-02 MED ORDER — SIMETHICONE 80 MG PO CHEW
80.0000 mg | CHEWABLE_TABLET | Freq: Four times a day (QID) | ORAL | Status: DC
  Administered 2023-12-02 – 2023-12-04 (×6): 80 mg via ORAL
  Filled 2023-12-02 (×7): qty 1

## 2023-12-02 MED ORDER — POTASSIUM CHLORIDE CRYS ER 20 MEQ PO TBCR
40.0000 meq | EXTENDED_RELEASE_TABLET | ORAL | Status: AC
  Administered 2023-12-02 (×2): 40 meq via ORAL
  Filled 2023-12-02 (×2): qty 2

## 2023-12-02 MED ORDER — PREDNISONE 20 MG PO TABS
40.0000 mg | ORAL_TABLET | Freq: Every day | ORAL | Status: AC
  Administered 2023-12-02 – 2023-12-04 (×3): 40 mg via ORAL
  Filled 2023-12-02 (×3): qty 2

## 2023-12-02 MED ORDER — FUROSEMIDE 10 MG/ML IJ SOLN
20.0000 mg | Freq: Once | INTRAMUSCULAR | Status: AC
  Administered 2023-12-02: 20 mg via INTRAVENOUS
  Filled 2023-12-02: qty 2

## 2023-12-02 MED ORDER — GUAIFENESIN ER 600 MG PO TB12
1200.0000 mg | ORAL_TABLET | Freq: Two times a day (BID) | ORAL | Status: DC
  Administered 2023-12-02 – 2023-12-04 (×3): 1200 mg via ORAL
  Filled 2023-12-02 (×4): qty 2

## 2023-12-02 NOTE — Plan of Care (Signed)
  Problem: Education: Goal: Knowledge of General Education information will improve Description: Including pain rating scale, medication(s)/side effects and non-pharmacologic comfort measures Outcome: Progressing   Problem: Clinical Measurements: Goal: Respiratory complications will improve Outcome: Progressing   Problem: Nutrition: Goal: Adequate nutrition will be maintained Outcome: Progressing   Problem: Coping: Goal: Level of anxiety will decrease Outcome: Progressing   Problem: Pain Managment: Goal: General experience of comfort will improve and/or be controlled Outcome: Progressing

## 2023-12-02 NOTE — Progress Notes (Signed)
 PROGRESS NOTE    Hannah [name]  FMW:[id number] DOB: 01/28/1933 DOA: 11/26/2023 PCP: Sun, Vyvyan, MD   Chief Complaint  Patient presents with   Abdominal Pain    Brief Narrative:  Per HPI:  Hannah [name] is an 89 y.o. female with medical history significant for buccal cancer status postresection and radiation, hypertension, hypothyroidism, type 2 diabetes mellitus, chronic lumbar radiculopathy, asthma, and CKD 3A who presents with abdominal pain.   Patient complains of pain across her upper abdomen.  She also reports experiencing diarrhea every time she sits down to urinate.  She denies vomiting.  She also notes feeling short of breath recently but denies any significant cough.   Assessment & Plan:   Principal Problem:   Colitis Active Problems:   Asthma, chronic obstructive, with acute exacerbation (HCC)   Hypothyroidism   Hypertension   Insulin  dependent type 2 diabetes mellitus (HCC)   CKD stage 3a, GFR 45-59 ml/min (HCC)   Iron  deficiency anemia   #1 colitis -Patient presented with abdominal pain, noted to have diarrhea prior to admission. - CT abdomen and pelvis done concerning for colitis. -Per RN patient with decreased volume of stool with improvement with consistency. - GI pathogen panel negative. - Per RN C. difficile unable to be obtained as patient with no further watery diarrhea with improvement with stool consistency. - Continue IV Rocephin , IV Flagyl  and can likely transition to oral antibiotics on discharge to complete a 7-day course of treatment.  -Saline lock IV fluids.   - Per GI.   2.  Acute asthma exacerbation versus COPD exacerbation -Noted with wheezing on examination on 11/30/2023. - Chest x-ray obtained negative for any acute infiltrate. -Improving clinically. - Discontinued Breo Ellipta . - Continue Brovana , Pulmicort , scheduled DuoNebs. - Transition from IV Solu-Medrol  to oral prednisone .   - Continue Claritin , Flonase, PPI.   - Mucinex 1200  mg twice daily.  - Will need outpatient follow-up with pulmonary.  3.  Diabetes mellitus type 2 -Hemoglobin A1c noted at 10.2. - CBG improved and currently at 83 this morning.   - Continue Semglee  30 units daily, NovoLog  meal coverage 8 units 3 times daily with meals, SSI.   - Monitor CBGs with steroid taper.    4.  Hypertension - Continue metoprolol  25 mg twice daily, Norvasc  2.5 mg daily.  - Outpatient follow-up with PCP.  5.  CKD 3A -Stable.  6.  Hypothyroidism - Continue Synthroid .   7.  Normocytic anemia/iron  deficiency anemia -Hemoglobin currently stable at 10.8. - Status post IV iron .  8.  Urinary incontinence -Family requesting urology assessment for patient's urinary incontinence which has worsened over the past 2 months affecting the quality of life. - Patient currently on Myrbetriq  25 mg daily. -Urology consulted at family's request who assessed patient and recommended outpatient urodynamic studies with Dr. Glendia MacDiarmid.  9.??  Livedo reticularis -Reddish-blue, not like/lacelike pattern noted on bilateral thighs. - Outpatient follow-up with PCP.  10.  Hypokalemia -Replete.   DVT prophylaxis: SCDs Code Status: Full Family Communication: Updated patient and daughters at bedside. Disposition: Home when clinically improved hopefully in the next 24 hours.   Status is: Inpatient Remains inpatient appropriate because: Severity of illness   Consultants:  Gastroenterology: Dr. Kristie 11/28/2023 Urology  Procedures:  CT angiogram chest 11/26/2023 CT abdomen and pelvis 11/26/2023 CT angiogram abdomen and pelvis 11/29/2023 Chest x-ray 11/26/2023, 11/30/2023  Antimicrobials:  Anti-infectives (From admission, onward)    Start     Dose/Rate Route Frequency Ordered Stop  12/01/23 0800  cefTRIAXone  (ROCEPHIN ) 2 g in sodium chloride  0.9 % 100 mL IVPB        2 g 200 mL/hr over 30 Minutes Intravenous Every 24 hours 11/30/23 2133 12/04/23 0759   11/27/23 2000  ceFEPIme   (MAXIPIME ) 2 g in sodium chloride  0.9 % 100 mL IVPB  Status:  Discontinued        2 g 200 mL/hr over 30 Minutes Intravenous Every 24 hours 11/27/23 0100 11/27/23 0841   11/27/23 2000  cefTRIAXone  (ROCEPHIN ) 1 g in sodium chloride  0.9 % 100 mL IVPB       Note to Pharmacy: Need assistance with timing, given cefepime  dose at 2000 7/12   1 g 200 mL/hr over 30 Minutes Intravenous Every 24 hours 11/27/23 0841 11/30/23 2104   11/27/23 0900  metroNIDAZOLE  (FLAGYL ) IVPB 500 mg        500 mg 100 mL/hr over 60 Minutes Intravenous Every 12 hours 11/27/23 0038 12/04/23 0859   11/26/23 2000  aztreonam (AZACTAM) 2 g in sodium chloride  0.9 % 100 mL IVPB  Status:  Discontinued        2 g 200 mL/hr over 30 Minutes Intravenous  Once 11/26/23 1948 11/26/23 1957   11/26/23 2000  metroNIDAZOLE  (FLAGYL ) IVPB 500 mg        500 mg 100 mL/hr over 60 Minutes Intravenous  Once 11/26/23 1948 11/26/23 2220   11/26/23 2000  vancomycin  (VANCOCIN ) IVPB 1000 mg/200 mL premix        1,000 mg 200 mL/hr over 60 Minutes Intravenous  Once 11/26/23 1948 11/26/23 2326   11/26/23 2000  cefTRIAXone  (ROCEPHIN ) 2 g in sodium chloride  0.9 % 100 mL IVPB  Status:  Discontinued        2 g 200 mL/hr over 30 Minutes Intravenous  Once 11/26/23 1957 11/26/23 1959   11/26/23 2000  ceFEPIme  (MAXIPIME ) 2 g in sodium chloride  0.9 % 100 mL IVPB        2 g 200 mL/hr over 30 Minutes Intravenous  Once 11/26/23 1959 11/26/23 2126         Subjective: Patient lying in bed.  Denies any chest pain or significant shortness of breath.  Some improvement with abdominal pain.  Patient with soft mushy stools per nurse tech today.  Daughters at bedside.    Objective: Vitals:   12/02/23 0720 12/02/23 1330 12/02/23 1401 12/02/23 1413  BP:  (!) 156/74    Pulse:  92    Resp:  20    Temp:  (!) 97.5 F (36.4 C)    TempSrc:  Oral    SpO2: 99% 99% 96% 99%  Weight:      Height:        Intake/Output Summary (Last 24 hours) at 12/02/2023 1651 Last data  filed at 12/02/2023 1637 Gross per 24 hour  Intake 400 ml  Output 250 ml  Net 150 ml   Filed Weights   11/26/23 2131  Weight: 44.8 kg    Examination:  General exam: NAD. Respiratory system: Minimal expiratory wheezing.  Some coarse breath sounds.  No crackles.  Fair air movement.  Cardiovascular system: Regular rate and rhythm no murmurs rubs or gallops.  No JVD.  No pitting lower extremity edema. Gastrointestinal system: Abdomen is soft, nontender, nondistended, positive bowel sounds.  No rebound.  No guarding.   Central nervous system: Alert and oriented. No focal neurological deficits. Extremities: Symmetric 5 x 5 power. Skin: Bilateral lower extremities on thighs with reddish-blue net like/lacelike pattern.  Psychiatry: Judgement and insight appear normal. Mood & affect appropriate.     Data Reviewed: I have personally reviewed following labs and imaging studies  CBC: Recent Labs  Lab 11/28/23 0455 11/28/23 1911 11/30/23 0535 12/01/23 0513 12/02/23 0442  WBC 20.1* 18.4* 15.4* 17.0* 20.8*  NEUTROABS 16.8* 15.7* 11.1* 13.7* 15.5*  HGB 9.5* 10.0* 11.1* 11.5* 10.8*  HCT 30.1* 30.8* 34.3* 35.2* 33.2*  MCV 96.5 93.3 91.0 92.4 93.5  PLT 255 281 296 326 307    Basic Metabolic Panel: Recent Labs  Lab 11/28/23 0455 11/29/23 0453 11/30/23 0535 12/01/23 0513 12/02/23 0442  NA 138 136 133* 137 140  K 4.0 3.4* 3.7 3.6 3.1*  CL 105 103 98 100 103  CO2 21* 25 25 25 26   GLUCOSE 184* 119* 106* 199* 91  BUN 20 19 17 15 17   CREATININE 0.89 0.82 0.69 0.74 0.81  CALCIUM  8.9 9.0 8.9 8.8* 8.7*  MG  --  1.9 2.0 2.0  --   PHOS  --   --  2.4*  --   --     GFR: Estimated Creatinine Clearance: 34 mL/min (by C-G formula based on SCr of 0.81 mg/dL).  Liver Function Tests: Recent Labs  Lab 11/26/23 1937 11/28/23 0455  AST 21 15  ALT 14 12  ALKPHOS 49 27*  BILITOT 0.5 0.5  PROT 7.5 5.5*  ALBUMIN 4.2 2.7*    CBG: Recent Labs  Lab 12/01/23 1617 12/01/23 1946  12/02/23 0723 12/02/23 1148 12/02/23 1550  GLUCAP 385* 358* 83 110* 196*     Recent Results (from the past 240 hours)  Resp panel by RT-PCR (RSV, Flu A&B, Covid) Anterior Nasal Swab     Status: None   Collection Time: 11/26/23  7:37 PM   Specimen: Anterior Nasal Swab  Result Value Ref Range Status   SARS Coronavirus 2 by RT PCR NEGATIVE NEGATIVE Final    Comment: (NOTE) SARS-CoV-2 target nucleic acids are NOT DETECTED.  The SARS-CoV-2 RNA is generally detectable in upper respiratory specimens during the acute phase of infection. The lowest concentration of SARS-CoV-2 viral copies this assay can detect is 138 copies/mL. A negative result does not preclude SARS-Cov-2 infection and should not be used as the sole basis for treatment or other patient management decisions. A negative result may occur with  improper specimen collection/handling, submission of specimen other than nasopharyngeal swab, presence of viral mutation(s) within the areas targeted by this assay, and inadequate number of viral copies(<138 copies/mL). A negative result must be combined with clinical observations, patient history, and epidemiological information. The expected result is Negative.  Fact Sheet for Patients:  BloggerCourse.com  Fact Sheet for Healthcare Providers:  SeriousBroker.it  This test is no t yet approved or cleared by the [place]  FDA and  has been authorized for detection and/or diagnosis of SARS-CoV-2 by FDA under an Emergency Use Authorization (EUA). This EUA will remain  in effect (meaning this test can be used) for the duration of the COVID-19 declaration under Section 564(b)(1) of the Act, 21 U.S.C.section 360bbb-3(b)(1), unless the authorization is terminated  or revoked sooner.       Influenza A by PCR NEGATIVE NEGATIVE Final   Influenza B by PCR NEGATIVE NEGATIVE Final    Comment: (NOTE) The Xpert Xpress  SARS-CoV-2/FLU/RSV plus assay is intended as an aid in the diagnosis of influenza from Nasopharyngeal swab specimens and should not be used as a sole basis for treatment. Nasal washings and aspirates are unacceptable for Xpert  Xpress SARS-CoV-2/FLU/RSV testing.  Fact Sheet for Patients: BloggerCourse.com  Fact Sheet for Healthcare Providers: SeriousBroker.it  This test is not yet approved or cleared by the [place]  FDA and has been authorized for detection and/or diagnosis of SARS-CoV-2 by FDA under an Emergency Use Authorization (EUA). This EUA will remain in effect (meaning this test can be used) for the duration of the COVID-19 declaration under Section 564(b)(1) of the Act, 21 U.S.C. section 360bbb-3(b)(1), unless the authorization is terminated or revoked.     Resp Syncytial Virus by PCR NEGATIVE NEGATIVE Final    Comment: (NOTE) Fact Sheet for Patients: BloggerCourse.com  Fact Sheet for Healthcare Providers: SeriousBroker.it  This test is not yet approved or cleared by the [place]  FDA and has been authorized for detection and/or diagnosis of SARS-CoV-2 by FDA under an Emergency Use Authorization (EUA). This EUA will remain in effect (meaning this test can be used) for the duration of the COVID-19 declaration under Section 564(b)(1) of the Act, 21 U.S.C. section 360bbb-3(b)(1), unless the authorization is terminated or revoked.  Performed at [facility], [address], [place], [place] [address]   Blood Culture (routine x 2)     Status: None   Collection Time: 11/26/23  7:48 PM   Specimen: BLOOD  Result Value Ref Range Status   Specimen Description   Final    BLOOD RIGHT ANTECUBITAL Performed at [facility], [address] [place], [place] [address]    Special Requests   Final    BOTTLES DRAWN AEROBIC AND  ANAEROBIC Blood Culture results may not be optimal due to an inadequate volume of blood received in culture bottles   Culture   Final    NO GROWTH 5 DAYS Performed at [facility], [address], [place], [place] [address]    Report Status 12/01/2023 FINAL  Final  Blood Culture (routine x 2)     Status: None   Collection Time: 11/26/23  8:09 PM   Specimen: BLOOD  Result Value Ref Range Status   Specimen Description   Final    BLOOD LEFT ANTECUBITAL Performed at [facility], [address] [place], [place] [address]    Special Requests   Final    BOTTLES DRAWN AEROBIC AND ANAEROBIC Blood Culture results may not be optimal due to an inadequate volume of blood received in culture bottles Performed at [facility], [address] [place], [place] [address]    Culture   Final    NO GROWTH 5 DAYS Performed at [facility], [address], [place], [place] [address]    Report Status 12/01/2023 FINAL  Final  Respiratory (~20 pathogens) panel by PCR     Status: None   Collection Time: 11/27/23 10:40 PM   Specimen: Nasopharyngeal Swab; Respiratory  Result Value Ref Range Status   Adenovirus NOT DETECTED NOT DETECTED Final   Coronavirus 229E NOT DETECTED NOT DETECTED Final    Comment: (NOTE) The Coronavirus on the Respiratory Panel, DOES NOT test for the novel  Coronavirus (2019 nCoV)    Coronavirus HKU1 NOT DETECTED NOT DETECTED Final   Coronavirus NL63 NOT DETECTED NOT DETECTED Final   Coronavirus OC43 NOT DETECTED NOT DETECTED Final   Metapneumovirus NOT DETECTED NOT DETECTED Final   Rhinovirus / Enterovirus NOT DETECTED NOT DETECTED Final   Influenza A NOT DETECTED NOT DETECTED Final   Influenza B NOT DETECTED NOT DETECTED Final   Parainfluenza Virus 1 NOT DETECTED NOT DETECTED  Final   Parainfluenza Virus 2 NOT DETECTED NOT DETECTED Final   Parainfluenza Virus 3 NOT DETECTED NOT DETECTED Final   Parainfluenza Virus 4 NOT  DETECTED NOT DETECTED Final   Respiratory Syncytial Virus NOT DETECTED NOT DETECTED Final   Bordetella pertussis NOT DETECTED NOT DETECTED Final   Bordetella Parapertussis NOT DETECTED NOT DETECTED Final   Chlamydophila pneumoniae NOT DETECTED NOT DETECTED Final   Mycoplasma pneumoniae NOT DETECTED NOT DETECTED Final    Comment: Performed at [facility], [address], [place], [place] [address]  Gastrointestinal Panel by PCR , Stool     Status: None   Collection Time: 11/29/23  1:18 PM   Specimen: Stool  Result Value Ref Range Status   Campylobacter species NOT DETECTED NOT DETECTED Final   Plesimonas shigelloides NOT DETECTED NOT DETECTED Final   Salmonella species NOT DETECTED NOT DETECTED Final   Yersinia enterocolitica NOT DETECTED NOT DETECTED Final   Vibrio species NOT DETECTED NOT DETECTED Final   Vibrio cholerae NOT DETECTED NOT DETECTED Final   Enteroaggregative E coli (EAEC) NOT DETECTED NOT DETECTED Final   Enteropathogenic E coli (EPEC) NOT DETECTED NOT DETECTED Final   Enterotoxigenic E coli (ETEC) NOT DETECTED NOT DETECTED Final   Shiga like toxin producing E coli (STEC) NOT DETECTED NOT DETECTED Final   Shigella/Enteroinvasive E coli (EIEC) NOT DETECTED NOT DETECTED Final   Cryptosporidium NOT DETECTED NOT DETECTED Final   Cyclospora cayetanensis NOT DETECTED NOT DETECTED Final   Entamoeba histolytica NOT DETECTED NOT DETECTED Final   Giardia lamblia NOT DETECTED NOT DETECTED Final   Adenovirus F40/41 NOT DETECTED NOT DETECTED Final   Astrovirus NOT DETECTED NOT DETECTED Final   Norovirus GI/GII NOT DETECTED NOT DETECTED Final   Rotavirus A NOT DETECTED NOT DETECTED Final   Sapovirus (I, II, IV, and V) NOT DETECTED NOT DETECTED Final    Comment: Performed at [facility], [address], [place], [place] [address]         Radiology Studies: DG CHEST PORT 1 VIEW Result Date: 11/30/2023 CLINICAL DATA:  Wheezing.  History of colitis EXAM:  PORTABLE CHEST 1 VIEW COMPARISON:  Chest x-ray and CTA chest 11/26/2023. FINDINGS: Hyperinflation. Film is rotated to the left. No consolidation,. Normal cardiopericardial silhouette. Calcifications along the tracheobronchial tree. Some kyphosis. IMPRESSION: Hyperinflation. No acute cardiopulmonary disease. Rotated radiograph. Electronically Signed   By: Ranell Bring M.D.   On: 11/30/2023 18:58        Scheduled Meds:  amLODipine   2.5 mg Oral Daily   arformoterol   15 mcg Nebulization BID   budesonide  (PULMICORT ) nebulizer solution  0.5 mg Nebulization BID   cholecalciferol   5,000 Units Oral Daily   donepezil   5 mg Oral QHS   ezetimibe   10 mg Oral q1800   And   simvastatin   20 mg Oral q1800   feeding supplement  237 mL Oral BID BM   insulin  aspart  0-5 Units Subcutaneous QHS   insulin  aspart  0-6 Units Subcutaneous TID WC   insulin  aspart  8 Units Subcutaneous TID WC   insulin  glargine-yfgn  30 Units Subcutaneous Q2200   ipratropium-albuterol   3 mL Nebulization TID   latanoprost   1 drop Both Eyes QHS   levothyroxine   25 mcg Oral Q48H   levothyroxine   50 mcg Oral Q48H   loratadine   10 mg Oral Daily   metoprolol  tartrate  25 mg Oral BID   mirabegron  ER  25 mg Oral Daily   multivitamin with  minerals  1 tablet Oral Daily   pantoprazole   40 mg Oral Q0600   phosphorus  250 mg Oral BID   predniSONE   40 mg Oral QAC breakfast   sodium chloride  flush  3 mL Intravenous Q12H   Continuous Infusions:  cefTRIAXone  (ROCEPHIN )  IV Stopped (12/02/23 9161)   metronidazole  Stopped (12/02/23 0945)     LOS: 6 days    Time spent: 40 minutes    Toribio Hummer, MD [facility]   To contact the attending provider between 7A-7P or the covering provider during after hours 7P-7A, please log into the web site [email or web address] and access using universal [place] password for that web site. If you do not have the password, please call the hospital operator.  12/02/2023, 4:51 PM

## 2023-12-02 NOTE — Evaluation (Signed)
 Physical Therapy Evaluation Patient Details Name: Hannah [name] MRN: [id number] DOB: 11/27/1932 Today's Date: 12/02/2023  History of Present Illness  89 y.o. female admitted on 11/26/23 for colitis and acute asthma exacerbation versus COPD exacerbation.  Past medical history significant for buccal cancer status post resection and radiation, hypertension, hypothyroidism, type 2 diabetes mellitus, chronic lumbar radiculopathy, asthma, and CKD 3A  Clinical Impression  Pt admitted with above diagnosis.  Pt currently with functional limitations due to the deficits listed below (see PT Problem List). Pt will benefit from acute skilled PT to increase their independence and safety with mobility to allow discharge.  Pt able to speak very basic English and reports feeling okay today and appeared agreeable to ambulate.  Pt's family not present at time of PT evaluation however per reviewing chart, pt is from home and lives with her children.  Pt able to ambulate short distance in hallway and utilized RW for support/endurance today.  Pt currently requiring min assist.  Pt is likely close to her mobility baseline and appears capable of returning home with family support.  Recommend HHPT and RW if pt/family agreeable.      If plan is discharge home, recommend the following: Assistance with cooking/housework;Assist for transportation;Help with stairs or ramp for entrance   Can travel by private vehicle        Equipment Recommendations Rolling walker (2 wheels)  Recommendations for Other Services       Functional Status Assessment Patient has had a recent decline in their functional status and demonstrates the ability to make significant improvements in function in a reasonable and predictable amount of time.     Precautions / Restrictions Precautions Precautions: Fall      Mobility  Bed Mobility Overal bed mobility: Needs Assistance Bed Mobility: Supine to Sit, Sit to Supine     Supine to sit:  Min assist, HOB elevated Sit to supine: Contact guard assist, HOB elevated   General bed mobility comments: assist for trunk upright    Transfers Overall transfer level: Needs assistance Equipment used: Rolling walker (2 wheels) Transfers: Sit to/from Stand Sit to Stand: Min assist           General transfer comment: light assist to rise, multimodal cues for hand placement    Ambulation/Gait Ambulation/Gait assistance: Contact guard assist, Min assist Gait Distance (Feet): 50 Feet Assistive device: Rolling walker (2 wheels) Gait Pattern/deviations: Step-through pattern, Decreased stride length Gait velocity: decr     General Gait Details: assist for stabilizing with turning; multimodal cues for use of RW and posture; pt coughing during session; SPO2 99% on room air upon return to bed  Stairs            Wheelchair Mobility     Tilt Bed    Modified Rankin (Stroke Patients Only)       Balance Overall balance assessment: Mild deficits observed, not formally tested                                           Pertinent Vitals/Pain Pain Assessment Pain Assessment: Faces Faces Pain Scale: No hurt Pain Intervention(s): Repositioned, Monitored during session    Home Living Family/patient expects to be discharged to:: Private residence Living Arrangements: Children                 Additional Comments: per chart review, pt from home with children,  family not present and pt speaks a language other then [place] (speaks/understands very basic English only)    Prior Function               Mobility Comments: ambulatory       Extremity/Trunk Assessment        Lower Extremity Assessment Lower Extremity Assessment: Generalized weakness    Cervical / Trunk Assessment Cervical / Trunk Assessment: Kyphotic  Communication   Communication Factors Affecting Communication: Non - English speaking, interpreter not available (no family  present, no active banner in chart of language, requested charge RN look into need for interpreter/language/dialect (family not present at time of PT evaluation))    Cognition Arousal: Alert Behavior During Therapy: WFL for tasks assessed/performed   PT - Cognitive impairments: No family/caregiver present to determine baseline                         Following commands: Intact       Cueing Cueing Techniques: Gestural cues, Verbal cues     General Comments      Exercises     Assessment/Plan    PT Assessment Patient needs continued PT services  PT Problem List Decreased mobility;Decreased activity tolerance;Decreased strength;Decreased knowledge of use of DME       PT Treatment Interventions DME instruction;Gait training;Balance training;Functional mobility training;Therapeutic activities;Therapeutic exercise;Patient/family education    PT Goals (Current goals can be found in the Care Plan section)  Acute Rehab PT Goals PT Goal Formulation: With patient Time For Goal Achievement: 12/16/23 Potential to Achieve Goals: Good    Frequency Min 2X/week     Co-evaluation               AM-PAC PT 6 Clicks Mobility  Outcome Measure Help needed turning from your back to your side while in a flat bed without using bedrails?: A Little Help needed moving from lying on your back to sitting on the side of a flat bed without using bedrails?: A Little Help needed moving to and from a bed to a chair (including a wheelchair)?: A Little Help needed standing up from a chair using your arms (e.g., wheelchair or bedside chair)?: A Little Help needed to walk in hospital room?: A Little Help needed climbing 3-5 steps with a railing? : A Lot 6 Click Score: 17    End of Session Equipment Utilized During Treatment: Gait belt Activity Tolerance: Patient tolerated treatment well Patient left: in bed;with call bell/phone within reach   PT Visit Diagnosis: Difficulty in  walking, not elsewhere classified (R26.2);Muscle weakness (generalized) (M62.81)    Time: 8488-8474 PT Time Calculation (min) (ACUTE ONLY): 14 min   Charges:   PT Evaluation $PT Eval Low Complexity: 1 Low   PT General Charges $$ ACUTE PT VISIT: 1 Visit        Tari PT, DPT Physical Therapist Acute Rehabilitation Services Office: [phone]   Tari CROME Payson 12/02/2023, 3:52 PM

## 2023-12-02 NOTE — Plan of Care (Signed)
  Problem: Education: Goal: Knowledge of General Education information will improve Description: Including pain rating scale, medication(s)/side effects and non-pharmacologic comfort measures Outcome: Progressing   Problem: Clinical Measurements: Goal: Diagnostic test results will improve Outcome: Progressing   Problem: Activity: Goal: Risk for activity intolerance will decrease Outcome: Progressing   Problem: Nutrition: Goal: Adequate nutrition will be maintained Outcome: Progressing   Problem: Coping: Goal: Level of anxiety will decrease Outcome: Progressing   Problem: Pain Managment: Goal: General experience of comfort will improve and/or be controlled Outcome: Progressing

## 2023-12-02 NOTE — Progress Notes (Signed)
 Mobility Specialist - Progress Note   12/02/23 1007  Mobility  Activity Ambulated with assistance in room;Transferred from bed to chair  Level of Assistance Minimal assist, patient does 75% or more  Assistive Device Front wheel walker  Distance Ambulated (ft) 3 ft  Range of Motion/Exercises Active  Activity Response Tolerated well  Mobility visit 1 Mobility  Mobility Specialist Start Time (ACUTE ONLY) 0950  Mobility Specialist Stop Time (ACUTE ONLY) 1007  Mobility Specialist Time Calculation (min) (ACUTE ONLY) 17 min   Pt was found in bed and agreeable to transfer to recliner chair. C/o headache and had urine incontinence once standing. At EOS was left on recliner chair with all needs met. Daughter in room.  Erminio Leos,  Mobility Specialist Can be reached via Secure Chat

## 2023-12-02 NOTE — Plan of Care (Incomplete)
  Problem: Education: Goal: Knowledge of General Education information will improve Description: Including pain rating scale, medication(s)/side effects and non-pharmacologic comfort measures Outcome: Progressing   Problem: Health Behavior/Discharge Planning: Goal: Ability to manage health-related needs will improve Outcome: Progressing   Problem: Clinical Measurements: Goal: Ability to maintain clinical measurements within normal limits will improve Outcome: Progressing Goal: Will remain free from infection Outcome: Progressing Goal: Diagnostic test results will improve Outcome: Progressing   Problem: Activity: Goal: Risk for activity intolerance will decrease Outcome: Progressing   Problem: Nutrition: Goal: Adequate nutrition will be maintained Outcome: Progressing   Problem: Elimination: Goal: Will not experience complications related to bowel motility Outcome: Progressing Goal: Will not experience complications related to urinary retention Outcome: Progressing   Problem: Fluid Volume: Goal: Ability to maintain a balanced intake and output will improve Outcome: Progressing   Problem: Health Behavior/Discharge Planning: Goal: Ability to identify and utilize available resources and services will improve Outcome: Progressing Goal: Ability to manage health-related needs will improve Outcome: Progressing   Problem: Metabolic: Goal: Ability to maintain appropriate glucose levels will improve Outcome: Progressing   Problem: Fluid Volume: Goal: Hemodynamic stability will improve Outcome: Progressing   Problem: Clinical Measurements: Goal: Respiratory complications will improve Outcome: Adequate for Discharge Goal: Cardiovascular complication will be avoided Outcome: Adequate for Discharge   Problem: Coping: Goal: Level of anxiety will decrease Outcome: Adequate for Discharge   Problem: Safety: Goal: Ability to remain free from injury will improve Outcome:  Adequate for Discharge   Problem: Skin Integrity: Goal: Risk for impaired skin integrity will decrease Outcome: Adequate for Discharge   Problem: Skin Integrity: Goal: Risk for impaired skin integrity will decrease Outcome: Adequate for Discharge   Problem: Respiratory: Goal: Ability to maintain adequate ventilation will improve Outcome: Adequate for Discharge

## 2023-12-03 DIAGNOSIS — J441 Chronic obstructive pulmonary disease with (acute) exacerbation: Secondary | ICD-10-CM | POA: Diagnosis not present

## 2023-12-03 DIAGNOSIS — K529 Noninfective gastroenteritis and colitis, unspecified: Secondary | ICD-10-CM | POA: Diagnosis not present

## 2023-12-03 DIAGNOSIS — I10 Essential (primary) hypertension: Secondary | ICD-10-CM | POA: Diagnosis not present

## 2023-12-03 DIAGNOSIS — E039 Hypothyroidism, unspecified: Secondary | ICD-10-CM | POA: Diagnosis not present

## 2023-12-03 LAB — CBC WITH DIFFERENTIAL/PLATELET
Abs Immature Granulocytes: 2.4 K/uL — ABNORMAL HIGH (ref 0.00–0.07)
Basophils Absolute: 0 K/uL (ref 0.0–0.1)
Basophils Relative: 0 %
Eosinophils Absolute: 0 K/uL (ref 0.0–0.5)
Eosinophils Relative: 0 %
HCT: 37.9 % (ref 36.0–46.0)
Hemoglobin: 12 g/dL (ref 12.0–15.0)
Immature Granulocytes: 10 %
Lymphocytes Relative: 12 %
Lymphs Abs: 2.9 K/uL (ref 0.7–4.0)
MCH: 29.9 pg (ref 26.0–34.0)
MCHC: 31.7 g/dL (ref 30.0–36.0)
MCV: 94.3 fL (ref 80.0–100.0)
Monocytes Absolute: 2.1 K/uL — ABNORMAL HIGH (ref 0.1–1.0)
Monocytes Relative: 9 %
Neutro Abs: 15.9 K/uL — ABNORMAL HIGH (ref 1.7–7.7)
Neutrophils Relative %: 69 %
Platelets: 358 K/uL (ref 150–400)
RBC: 4.02 MIL/uL (ref 3.87–5.11)
RDW: 13.2 % (ref 11.5–15.5)
WBC: 23.3 K/uL — ABNORMAL HIGH (ref 4.0–10.5)
nRBC: 0.4 % — ABNORMAL HIGH (ref 0.0–0.2)

## 2023-12-03 LAB — GLUCOSE, CAPILLARY
Glucose-Capillary: 65 mg/dL — ABNORMAL LOW (ref 70–99)
Glucose-Capillary: 208 mg/dL — ABNORMAL HIGH (ref 70–99)
Glucose-Capillary: 425 mg/dL — ABNORMAL HIGH (ref 70–99)
Glucose-Capillary: 450 mg/dL — ABNORMAL HIGH (ref 70–99)
Glucose-Capillary: 415 mg/dL — ABNORMAL HIGH (ref 70–99)
Glucose-Capillary: 416 mg/dL — ABNORMAL HIGH (ref 70–99)
Glucose-Capillary: 370 mg/dL — ABNORMAL HIGH (ref 70–99)
Glucose-Capillary: 195 mg/dL — ABNORMAL HIGH (ref 70–99)
Glucose-Capillary: 105 mg/dL — ABNORMAL HIGH (ref 70–99)
Glucose-Capillary: 52 mg/dL — ABNORMAL LOW (ref 70–99)

## 2023-12-03 LAB — BASIC METABOLIC PANEL WITH GFR
Anion gap: 11 (ref 5–15)
BUN: 20 mg/dL (ref 8–23)
CO2: 28 mmol/L (ref 22–32)
Calcium: 9.4 mg/dL (ref 8.9–10.3)
Chloride: 99 mmol/L (ref 98–111)
Creatinine, Ser: 0.81 mg/dL (ref 0.44–1.00)
GFR, Estimated: >60 mL/min (ref >60)
Glucose, Bld: 59 mg/dL — ABNORMAL LOW (ref 70–99)
Potassium: 3.9 mmol/L (ref 3.5–5.1)
Sodium: 138 mmol/L (ref 135–145)

## 2023-12-03 MED ORDER — INSULIN ASPART 100 UNIT/ML IJ SOLN: 6.0000 [IU] | Freq: Three times a day (TID) | INTRAMUSCULAR | Status: DC

## 2023-12-03 MED ORDER — INSULIN ASPART 100 UNIT/ML IJ SOLN: 0.0000 [IU] | INTRAMUSCULAR | Status: DC

## 2023-12-03 MED ORDER — PREDNISONE 20 MG PO TABS: ORAL_TABLET | ORAL | 0 refills | Status: AC

## 2023-12-03 MED ORDER — INSULIN ASPART 100 UNIT/ML IJ SOLN
4.0000 [IU] | Freq: Three times a day (TID) | INTRAMUSCULAR | Status: DC
  Administered 2023-12-03: 4 [IU] via SUBCUTANEOUS

## 2023-12-03 MED ORDER — INSULIN ASPART 100 UNIT/ML IJ SOLN
6.0000 [IU] | Freq: Once | INTRAMUSCULAR | Status: AC
  Administered 2023-12-03: 6 [IU] via SUBCUTANEOUS

## 2023-12-03 MED ORDER — IPRATROPIUM-ALBUTEROL 0.5-2.5 (3) MG/3ML IN SOLN
3.0000 mL | Freq: Two times a day (BID) | RESPIRATORY_TRACT | Status: DC
  Administered 2023-12-03 – 2023-12-04 (×2): 3 mL via RESPIRATORY_TRACT
  Filled 2023-12-03 (×2): qty 3

## 2023-12-03 MED ORDER — LORATADINE 10 MG PO TABS: 10.0000 mg | ORAL_TABLET | Freq: Every day | ORAL | 0 refills | Status: DC

## 2023-12-03 MED ORDER — GUAIFENESIN ER 600 MG PO TB12: 1200.0000 mg | ORAL_TABLET | Freq: Two times a day (BID) | ORAL | 0 refills | Status: AC

## 2023-12-03 MED ORDER — LANTUS SOLOSTAR 100 UNIT/ML ~~LOC~~ SOPN: 25.0000 [IU] | PEN_INJECTOR | Freq: Every day | SUBCUTANEOUS | 0 refills | Status: DC

## 2023-12-03 MED ORDER — ALBUTEROL SULFATE HFA 108 (90 BASE) MCG/ACT IN AERS: INHALATION_SPRAY | RESPIRATORY_TRACT | 0 refills | Status: DC

## 2023-12-03 MED ORDER — INSULIN ASPART 100 UNIT/ML IJ SOLN: 0.0000 [IU] | Freq: Every day | INTRAMUSCULAR | Status: DC

## 2023-12-03 MED ORDER — AMLODIPINE BESYLATE 2.5 MG PO TABS: 2.5000 mg | ORAL_TABLET | Freq: Every day | ORAL | 0 refills | Status: DC

## 2023-12-03 MED ORDER — INSULIN ASPART 100 UNIT/ML IJ SOLN
11.0000 [IU] | Freq: Once | INTRAMUSCULAR | Status: AC
  Administered 2023-12-03: 11 [IU] via SUBCUTANEOUS

## 2023-12-03 MED ORDER — INSULIN GLARGINE-YFGN 100 UNIT/ML ~~LOC~~ SOLN
25.0000 [IU] | Freq: Every day | SUBCUTANEOUS | Status: DC
  Filled 2023-12-03 (×2): qty 0.25

## 2023-12-03 MED ORDER — INSULIN ASPART 100 UNIT/ML IJ SOLN
8.0000 [IU] | Freq: Three times a day (TID) | INTRAMUSCULAR | Status: DC
  Administered 2023-12-03: 8 [IU] via SUBCUTANEOUS

## 2023-12-03 MED ORDER — INSULIN ASPART 100 UNIT/ML IJ SOLN
0.0000 [IU] | Freq: Three times a day (TID) | INTRAMUSCULAR | Status: DC
  Administered 2023-12-03: 9 [IU] via SUBCUTANEOUS

## 2023-12-03 MED ORDER — SIMETHICONE 80 MG PO CHEW: 80.0000 mg | CHEWABLE_TABLET | Freq: Four times a day (QID) | ORAL | Status: DC | PRN

## 2023-12-03 NOTE — Evaluation (Signed)
 Occupational Therapy Evaluation Patient Details Name: Hannah [name] MRN: [id number] DOB: 1932-11-09 Today's Date: 12/03/2023   History of Present Illness   89 y.o. female admitted on 11/26/23 for colitis and acute asthma exacerbation versus COPD exacerbation.  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 Pt able to complete bed mobility with min A, transfers with multimodal cues for safe hand placement, ambulation to bathroom, GCA, peri care with GCA, sink level grooming GCA. Simulated min A for LB ADL, seated position for energy conservation instead of standing. OT will follow acutely and recommend HHOT post-acute to maximize safety and independence in ADL and functional transfers in the home environment. Next session to continue OOB activity and standing tolerance for ADL activity.      If plan is discharge home, recommend the following:   A little help with walking and/or transfers;A little help with bathing/dressing/bathroom;Assistance with cooking/housework;Help with stairs or ramp for entrance     Functional Status Assessment   Patient has had a recent decline in their functional status and demonstrates the ability to make significant improvements in function in a reasonable and predictable amount of time.     Equipment Recommendations   Tub/shower seat     Recommendations for Other Services   PT consult     Precautions/Restrictions   Precautions Precautions: Fall Restrictions Weight Bearing Restrictions Per Provider Order: No     Mobility Bed Mobility Overal bed mobility: Needs Assistance Bed Mobility: Supine to Sit, Sit  to Supine     Supine to sit: Min assist, HOB elevated Sit to supine: Contact guard assist, HOB elevated   General bed mobility comments: increased time, use of rails, assist for trunk    Transfers Overall transfer level: Needs assistance Equipment used: Rolling walker (2 wheels) Transfers: Sit to/from Stand Sit to Stand: Min assist           General transfer comment: light assist to rise, cues for safe hand placement      Balance Overall balance assessment: Mild deficits observed, not formally tested                                         ADL either performed or assessed with clinical judgement   ADL Overall ADL's : Needs assistance/impaired Eating/Feeding: Modified independent   Grooming: Contact guard assist Grooming Details (indicate cue type and reason): limited activity tolerance for standing activities Upper Body Bathing: Contact guard assist   Lower Body Bathing: Contact guard assist   Upper Body Dressing : Minimal assistance Upper Body Dressing Details (indicate cue type and reason): gown like robe Lower Body Dressing: Minimal assistance   Toilet Transfer: Contact guard assist   Toileting- Clothing Manipulation and Hygiene: Contact guard assist       Functional mobility during ADLs: Contact guard assist;Rolling walker (2 wheels) General ADL Comments: decreased activity tolerance     Vision Patient Visual Report: No change from baseline       Perception         Praxis         Pertinent  Vitals/Pain Pain Assessment Pain Assessment: Faces Faces Pain Scale: No hurt Pain Intervention(s): Repositioned, Monitored during session     Extremity/Trunk Assessment Upper Extremity Assessment Upper Extremity Assessment: Generalized weakness   Lower Extremity Assessment Lower Extremity Assessment: Defer to PT evaluation   Cervical / Trunk Assessment Cervical / Trunk Assessment: Kyphotic   Communication  Communication Communication: Other (comment) Factors Affecting Communication: Non - English speaking, interpreter not available   Cognition Arousal: Alert Behavior During Therapy: WFL for tasks assessed/performed Cognition: No apparent impairments             OT - Cognition Comments: follows directions, pleasant and cooperative                 Following commands: Intact       Cueing  General Comments   Cueing Techniques: Gestural cues;Verbal cues      Exercises     Shoulder Instructions      Home Living Family/patient expects to be discharged to:: Private residence Living Arrangements: Children Available Help at Discharge: Family;Available 24 hours/day Type of Home: House Home Access: Stairs to enter [employer] of Steps: 2 Entrance Stairs-Rails: None Home Layout: One level         Firefighter: Standard     Home Equipment: None          Prior Functioning/Environment Prior Level of Function : Needs assist             Mobility Comments: ambulatory ADLs Comments: family assists with IADL, PRN assist for bathing/dressing    OT Problem List: Decreased strength;Decreased activity tolerance;Impaired balance (sitting and/or standing);Decreased safety awareness;Decreased knowledge of use of DME or AE   OT Treatment/Interventions: Self-care/ADL training;DME and/or AE instruction;Patient/family education;Balance training      OT Goals(Current goals can be found in the care plan section)   Acute Rehab OT Goals Patient Stated Goal: get home OT Goal Formulation: With patient Time For Goal Achievement: 12/17/23 Potential to Achieve Goals: Good ADL Goals Pt Will Perform Grooming: with supervision;standing Pt Will Perform Upper Body Dressing: with modified independence;sitting Pt Will Perform Lower Body Dressing: with supervision;sit to/from stand Pt Will Transfer to Toilet: with supervision;ambulating Pt Will Perform Toileting -  Clothing Manipulation and hygiene: with modified independence;sitting/lateral leans   OT Frequency:  Min 2X/week    Co-evaluation              AM-PAC OT 6 Clicks Daily Activity     Outcome Measure Help from another person eating meals?: None Help from another person taking care of personal grooming?: A Little Help from another person toileting, which includes using toliet, bedpan, or urinal?: A Little Help from another person bathing (including washing, rinsing, drying)?: A Little Help from another person to put on and taking off regular upper body clothing?: A Little Help from another person to put on and taking off regular lower body clothing?: A Little 6 Click Score: 19   End of Session Equipment Utilized During Treatment: Gait belt;Rolling walker (2 wheels) Nurse Communication: Mobility status  Activity Tolerance: Patient tolerated treatment well Patient left: in bed;with call bell/phone within reach;with bed alarm set  OT Visit Diagnosis: Unsteadiness on feet (R26.81);Muscle weakness (generalized) (M62.81)                Time: 8481-8464 OT Time Calculation (min): 17 min Charges:  OT General Charges $OT Visit: 1 Visit OT Evaluation $OT Eval Low Complexity: 1 Low  Leita DEL OTR/L Acute Rehabilitation Services Office: [phone]  Leita PARAS Derwood Becraft 12/03/2023, 5:52 PM

## 2023-12-03 NOTE — Progress Notes (Signed)
 PROGRESS NOTE    Hannah [name]  FMW:[id number] DOB: [birth year] DOA: 11/26/2023 PCP: Sun, Vyvyan, MD   Chief Complaint  Patient presents with   Abdominal Pain    Brief Narrative:  Per HPI:  Hannah [name] is an 89 y.o. female with medical history significant for buccal cancer status postresection and radiation, hypertension, hypothyroidism, type 2 diabetes mellitus, chronic lumbar radiculopathy, asthma, and CKD 3A who presents with abdominal pain.   Patient complains of pain across her upper abdomen.  She also reports experiencing diarrhea every time she sits down to urinate.  She denies vomiting.  She also notes feeling short of breath recently but denies any significant cough.   Assessment & Plan:   Principal Problem:   Colitis Active Problems:   Asthma, chronic obstructive, with acute exacerbation (HCC)   Hypothyroidism   Hypertension   Insulin  dependent type 2 diabetes mellitus (HCC)   CKD stage 3a, GFR 45-59 ml/min (HCC)   Iron  deficiency anemia   #1 colitis -Patient presented with abdominal pain, noted to have diarrhea prior to admission. - CT abdomen and pelvis done concerning for colitis. -Per RN patient with decreased volume of stool with improvement with consistency. - GI pathogen panel negative. - Per RN C. difficile unable to be obtained as patient with no further watery diarrhea with improvement with stool consistency. - Continue IV Rocephin , IV Flagyl  to complete a 7-day course of treatment.   - Per GI.   2.  Acute asthma exacerbation versus COPD exacerbation -Noted with wheezing on examination on 11/30/2023. - Chest x-ray obtained negative for any acute infiltrate. -Improving clinically. - Discontinued Breo Ellipta . - Continue Brovana , Pulmicort , scheduled DuoNebs. - Transitioned from IV Solu-Medrol  to oral prednisone .   - Continue Claritin , Flonase, PPI.   - Mucinex  1200 mg twice daily.  - Will need outpatient follow-up with pulmonary.  3.   Diabetes mellitus type 2 -Hemoglobin A1c noted at 10.2. - CBG improved and noted at 65 this morning.   - Semglee  decreased to 25 units daily and NovoLog  meal coverage decreased to 4 units 3 times daily with meals and SSI.   - Repeat CBG this afternoon with CBG of 425.   - Increase meal coverage NovoLog  to 8 units 3 times daily with meals.  -Monitor CBGs with steroid taper.   4.  Hypertension - Continue metoprolol  25 mg twice daily, Norvasc  2.5 mg daily.  - Outpatient follow-up with PCP.  5.  CKD 3A -Stable.  6.  Hypothyroidism - Synthroid .   7.  Normocytic anemia/iron  deficiency anemia -Hemoglobin currently stable at 12.0. - Status post IV iron .  8.  Urinary incontinence -Family requesting urology assessment for patient's urinary incontinence which has worsened over the past 2 months affecting the quality of life. - Patient currently on Myrbetriq  25 mg daily. -Urology consulted at family's request who assessed patient and recommended outpatient urodynamic studies with Dr. Glendia MacDiarmid.  9.??  Livedo reticularis -Reddish-blue, not like/lacelike pattern noted on bilateral thighs. - Outpatient follow-up with PCP.  10.  Hypokalemia -Repleted.   DVT prophylaxis: SCDs Code Status: Full Family Communication: Updated patient and daughter at bedside. Disposition: Home when clinically improved and better control of CBGs hopefully in the next 24 hours.   Status is: Inpatient Remains inpatient appropriate because: Severity of illness   Consultants:  Gastroenterology: Dr. Kristie 11/28/2023 Urology  Procedures:  CT angiogram chest 11/26/2023 CT abdomen and pelvis 11/26/2023 CT angiogram abdomen and pelvis 11/29/2023 Chest x-ray 11/26/2023, 11/30/2023  Antimicrobials:  Anti-infectives (From admission, onward)    Start     Dose/Rate Route Frequency Ordered Stop   12/01/23 0800  cefTRIAXone  (ROCEPHIN ) 2 g in sodium chloride  0.9 % 100 mL IVPB        2 g 200 mL/hr over 30 Minutes  Intravenous Every 24 hours 11/30/23 2133 12/03/23 0814   11/27/23 2000  ceFEPIme  (MAXIPIME ) 2 g in sodium chloride  0.9 % 100 mL IVPB  Status:  Discontinued        2 g 200 mL/hr over 30 Minutes Intravenous Every 24 hours 11/27/23 0100 11/27/23 0841   11/27/23 2000  cefTRIAXone  (ROCEPHIN ) 1 g in sodium chloride  0.9 % 100 mL IVPB       Note to Pharmacy: Need assistance with timing, given cefepime  dose at 2000 7/12   1 g 200 mL/hr over 30 Minutes Intravenous Every 24 hours 11/27/23 0841 11/30/23 2104   11/27/23 0900  metroNIDAZOLE  (FLAGYL ) IVPB 500 mg        500 mg 100 mL/hr over 60 Minutes Intravenous Every 12 hours 11/27/23 0038 12/04/23 0859   11/26/23 2000  aztreonam (AZACTAM) 2 g in sodium chloride  0.9 % 100 mL IVPB  Status:  Discontinued        2 g 200 mL/hr over 30 Minutes Intravenous  Once 11/26/23 1948 11/26/23 1957   11/26/23 2000  metroNIDAZOLE  (FLAGYL ) IVPB 500 mg        500 mg 100 mL/hr over 60 Minutes Intravenous  Once 11/26/23 1948 11/26/23 2220   11/26/23 2000  vancomycin  (VANCOCIN ) IVPB 1000 mg/200 mL premix        1,000 mg 200 mL/hr over 60 Minutes Intravenous  Once 11/26/23 1948 11/26/23 2326   11/26/23 2000  cefTRIAXone  (ROCEPHIN ) 2 g in sodium chloride  0.9 % 100 mL IVPB  Status:  Discontinued        2 g 200 mL/hr over 30 Minutes Intravenous  Once 11/26/23 1957 11/26/23 1959   11/26/23 2000  ceFEPIme  (MAXIPIME ) 2 g in sodium chloride  0.9 % 100 mL IVPB        2 g 200 mL/hr over 30 Minutes Intravenous  Once 11/26/23 1959 11/26/23 2126         Subjective: Patient sitting up in bed.  Denies any chest pain no significant shortness of breath.  Denies any significant abdominal pain.  Stool consistency improved.  Daughter at bedside.   Objective: Vitals:   12/02/23 2056 12/03/23 0454 12/03/23 0854 12/03/23 1225  BP: (!) 149/78 (!) 143/83  (!) 140/69  Pulse:    99  Resp: 20 18  16   Temp: 99.1 F (37.3 C) 98.5 F (36.9 C)  98.9 F (37.2 C)  TempSrc: Oral Oral  Oral   SpO2: 99% 99% 98% 98%  Weight:      Height:        Intake/Output Summary (Last 24 hours) at 12/03/2023 1510 Last data filed at 12/03/2023 0200 Gross per 24 hour  Intake 300 ml  Output 1100 ml  Net -800 ml   Filed Weights   11/26/23 2131  Weight: 44.8 kg    Examination:  General exam: NAD. Respiratory system: Some scattered coarse breath sounds.  No crackles.  No significant expiratory wheezing.  Fair air movement.  Speaking in full sentences. Cardiovascular system: RRR no murmurs rubs or gallops.  No JVD.  No pitting lower extremity edema.  Gastrointestinal system: Abdomen is soft, nontender, nondistended, positive bowel sounds.  No rebound.  No guarding.  Central nervous system: Alert  and oriented. No focal neurological deficits. Extremities: Symmetric 5 x 5 power. Skin: Bilateral lower extremities on thighs with reddish-blue net like/lacelike pattern.  Psychiatry: Judgement and insight appear normal. Mood & affect appropriate.     Data Reviewed: I have personally reviewed following labs and imaging studies  CBC: Recent Labs  Lab 11/28/23 1911 11/30/23 0535 12/01/23 0513 12/02/23 0442 12/03/23 0544  WBC 18.4* 15.4* 17.0* 20.8* 23.3*  NEUTROABS 15.7* 11.1* 13.7* 15.5* 15.9*  HGB 10.0* 11.1* 11.5* 10.8* 12.0  HCT 30.8* 34.3* 35.2* 33.2* 37.9  MCV 93.3 91.0 92.4 93.5 94.3  PLT 281 296 326 307 358    Basic Metabolic Panel: Recent Labs  Lab 11/29/23 0453 11/30/23 0535 12/01/23 0513 12/02/23 0442 12/03/23 0544  NA 136 133* 137 140 138  K 3.4* 3.7 3.6 3.1* 3.9  CL 103 98 100 103 99  CO2 25 25 25 26 28   GLUCOSE 119* 106* 199* 91 59*  BUN 19 17 15 17 20   CREATININE 0.82 0.69 0.74 0.81 0.81  CALCIUM  9.0 8.9 8.8* 8.7* 9.4  MG 1.9 2.0 2.0  --   --   PHOS  --  2.4*  --   --   --     GFR: Estimated Creatinine Clearance: 34 mL/min (by C-G formula based on SCr of 0.81 mg/dL).  Liver Function Tests: Recent Labs  Lab 11/26/23 1937 11/28/23 0455  AST 21 15   ALT 14 12  ALKPHOS 49 27*  BILITOT 0.5 0.5  PROT 7.5 5.5*  ALBUMIN 4.2 2.7*    CBG: Recent Labs  Lab 12/02/23 2051 12/03/23 0738 12/03/23 0912 12/03/23 1226 12/03/23 1450  GLUCAP 226* 65* 208* 425* 450*     Recent Results (from the past 240 hours)  Resp panel by RT-PCR (RSV, Flu A&B, Covid) Anterior Nasal Swab     Status: None   Collection Time: 11/26/23  7:37 PM   Specimen: Anterior Nasal Swab  Result Value Ref Range Status   SARS Coronavirus 2 by RT PCR NEGATIVE NEGATIVE Final    Comment: (NOTE) SARS-CoV-2 target nucleic acids are NOT DETECTED.  The SARS-CoV-2 RNA is generally detectable in upper respiratory specimens during the acute phase of infection. The lowest concentration of SARS-CoV-2 viral copies this assay can detect is 138 copies/mL. A negative result does not preclude SARS-Cov-2 infection and should not be used as the sole basis for treatment or other patient management decisions. A negative result may occur with  improper specimen collection/handling, submission of specimen other than nasopharyngeal swab, presence of viral mutation(s) within the areas targeted by this assay, and inadequate number of viral copies(<138 copies/mL). A negative result must be combined with clinical observations, patient history, and epidemiological information. The expected result is Negative.  Fact Sheet for Patients:  BloggerCourse.com  Fact Sheet for Healthcare Providers:  SeriousBroker.it  This test is no t yet approved or cleared by the [place]  FDA and  has been authorized for detection and/or diagnosis of SARS-CoV-2 by FDA under an Emergency Use Authorization (EUA). This EUA will remain  in effect (meaning this test can be used) for the duration of the COVID-19 declaration under Section 564(b)(1) of the Act, 21 U.S.C.section 360bbb-3(b)(1), unless the authorization is terminated  or revoked sooner.        Influenza A by PCR NEGATIVE NEGATIVE Final   Influenza B by PCR NEGATIVE NEGATIVE Final    Comment: (NOTE) The Xpert Xpress SARS-CoV-2/FLU/RSV plus assay is intended as an aid in the diagnosis of  influenza from Nasopharyngeal swab specimens and should not be used as a sole basis for treatment. Nasal washings and aspirates are unacceptable for Xpert Xpress SARS-CoV-2/FLU/RSV testing.  Fact Sheet for Patients: BloggerCourse.com  Fact Sheet for Healthcare Providers: SeriousBroker.it  This test is not yet approved or cleared by the [place]  FDA and has been authorized for detection and/or diagnosis of SARS-CoV-2 by FDA under an Emergency Use Authorization (EUA). This EUA will remain in effect (meaning this test can be used) for the duration of the COVID-19 declaration under Section 564(b)(1) of the Act, 21 U.S.C. section 360bbb-3(b)(1), unless the authorization is terminated or revoked.     Resp Syncytial Virus by PCR NEGATIVE NEGATIVE Final    Comment: (NOTE) Fact Sheet for Patients: BloggerCourse.com  Fact Sheet for Healthcare Providers: SeriousBroker.it  This test is not yet approved or cleared by the [place]  FDA and has been authorized for detection and/or diagnosis of SARS-CoV-2 by FDA under an Emergency Use Authorization (EUA). This EUA will remain in effect (meaning this test can be used) for the duration of the COVID-19 declaration under Section 564(b)(1) of the Act, 21 U.S.C. section 360bbb-3(b)(1), unless the authorization is terminated or revoked.  Performed at [facility], [address], [place], [place] [address]   Blood Culture (routine x 2)     Status: None   Collection Time: 11/26/23  7:48 PM   Specimen: BLOOD  Result Value Ref Range Status   Specimen Description   Final    BLOOD RIGHT ANTECUBITAL Performed at [facility], [address], [place], [place] [address]    Special Requests   Final    BOTTLES DRAWN AEROBIC AND ANAEROBIC Blood Culture results may not be optimal due to an inadequate volume of blood received in culture bottles   Culture   Final    NO GROWTH 5 DAYS Performed at [facility], [address], [place], [place] [address]    Report Status 12/01/2023 FINAL  Final  Blood Culture (routine x 2)     Status: None   Collection Time: 11/26/23  8:09 PM   Specimen: BLOOD  Result Value Ref Range Status   Specimen Description   Final    BLOOD LEFT ANTECUBITAL Performed at [facility], [address] [place], [place] [address]    Special Requests   Final    BOTTLES DRAWN AEROBIC AND ANAEROBIC Blood Culture results may not be optimal due to an inadequate volume of blood received in culture bottles Performed at [facility], [address] [place], [place] [address]    Culture   Final    NO GROWTH 5 DAYS Performed at [facility], [address] [place], [place] [address]    Report Status 12/01/2023 FINAL  Final  Respiratory (~20 pathogens) panel by PCR     Status: None   Collection Time: 11/27/23 10:40 PM   Specimen: Nasopharyngeal Swab; Respiratory  Result Value Ref Range Status   Adenovirus NOT DETECTED NOT DETECTED Final   Coronavirus 229E NOT DETECTED NOT DETECTED Final    Comment: (NOTE) The Coronavirus on the Respiratory Panel, DOES NOT test for the novel  Coronavirus (2019 nCoV)    Coronavirus HKU1 NOT DETECTED NOT DETECTED Final   Coronavirus NL63 NOT DETECTED NOT DETECTED Final   Coronavirus OC43 NOT DETECTED NOT DETECTED Final   Metapneumovirus NOT DETECTED NOT DETECTED Final   Rhinovirus / Enterovirus NOT DETECTED NOT DETECTED Final   Influenza  A NOT DETECTED NOT DETECTED Final   Influenza B NOT DETECTED NOT DETECTED Final   Parainfluenza Virus 1 NOT DETECTED NOT DETECTED Final   Parainfluenza  Virus 2 NOT DETECTED NOT DETECTED Final   Parainfluenza Virus 3 NOT DETECTED NOT DETECTED Final   Parainfluenza Virus 4 NOT DETECTED NOT DETECTED Final   Respiratory Syncytial Virus NOT DETECTED NOT DETECTED Final   Bordetella pertussis NOT DETECTED NOT DETECTED Final   Bordetella Parapertussis NOT DETECTED NOT DETECTED Final   Chlamydophila pneumoniae NOT DETECTED NOT DETECTED Final   Mycoplasma pneumoniae NOT DETECTED NOT DETECTED Final    Comment: Performed at [facility], [address] [place], [place] [address]  Gastrointestinal Panel by PCR , Stool     Status: None   Collection Time: 11/29/23  1:18 PM   Specimen: Stool  Result Value Ref Range Status   Campylobacter species NOT DETECTED NOT DETECTED Final   Plesimonas shigelloides NOT DETECTED NOT DETECTED Final   Salmonella species NOT DETECTED NOT DETECTED Final   Yersinia enterocolitica NOT DETECTED NOT DETECTED Final   Vibrio species NOT DETECTED NOT DETECTED Final   Vibrio cholerae NOT DETECTED NOT DETECTED Final   Enteroaggregative E coli (EAEC) NOT DETECTED NOT DETECTED Final   Enteropathogenic E coli (EPEC) NOT DETECTED NOT DETECTED Final   Enterotoxigenic E coli (ETEC) NOT DETECTED NOT DETECTED Final   Shiga like toxin producing E coli (STEC) NOT DETECTED NOT DETECTED Final   Shigella/Enteroinvasive E coli (EIEC) NOT DETECTED NOT DETECTED Final   Cryptosporidium NOT DETECTED NOT DETECTED Final   Cyclospora cayetanensis NOT DETECTED NOT DETECTED Final   Entamoeba histolytica NOT DETECTED NOT DETECTED Final   Giardia lamblia NOT DETECTED NOT DETECTED Final   Adenovirus F40/41 NOT DETECTED NOT DETECTED Final   Astrovirus NOT DETECTED NOT DETECTED Final   Norovirus GI/GII NOT DETECTED NOT DETECTED Final   Rotavirus A NOT DETECTED NOT DETECTED Final   Sapovirus (I, II, IV, and V) NOT DETECTED NOT DETECTED Final    Comment: Performed at [facility], [address] [place], [place] [address]          Radiology Studies: No results found.       Scheduled Meds:  amLODipine   2.5 mg Oral Daily   arformoterol   15 mcg Nebulization BID   budesonide  (PULMICORT ) nebulizer solution  0.5 mg Nebulization BID   cholecalciferol   5,000 Units Oral Daily   donepezil   5 mg Oral QHS   ezetimibe   10 mg Oral q1800   And   simvastatin   20 mg Oral q1800   feeding supplement  237 mL Oral BID BM   guaiFENesin   1,200 mg Oral BID   insulin  aspart  0-5 Units Subcutaneous QHS   insulin  aspart  0-9 Units Subcutaneous TID WC   insulin  aspart  11 Units Subcutaneous Once   insulin  aspart  6 Units Subcutaneous TID WC   insulin  glargine-yfgn  25 Units Subcutaneous Q2200   ipratropium-albuterol   3 mL Nebulization BID   latanoprost   1 drop Both Eyes QHS   levothyroxine   25 mcg Oral Q48H   levothyroxine   50 mcg Oral Q48H   loratadine   10 mg Oral Daily   metoprolol  tartrate  25 mg Oral BID   mirabegron  ER  25 mg Oral Daily   multivitamin with minerals  1 tablet Oral Daily   pantoprazole   40 mg Oral Q0600   predniSONE   40 mg Oral QAC breakfast   simethicone   80  mg Oral QID   sodium chloride  flush  3 mL Intravenous Q12H   Continuous Infusions:  metronidazole  500 mg (12/03/23 0827)     LOS: 7 days    Time spent: 40 minutes    Toribio Hummer, MD [facility]   To contact the attending provider between 7A-7P or the covering provider during after hours 7P-7A, please log into the web site [email or web address] and access using universal [place] password for that web site. If you do not have the password, please call the hospital operator.  12/03/2023, 3:10 PM

## 2023-12-03 NOTE — Discharge Summary (Incomplete)
 Physician Discharge Summary  Hannah [name] FMW:[id number] DOB: [birth year] DOA: 11/26/2023  PCP: Sun, Vyvyan, MD  Admit date: 11/26/2023 Discharge date: 12/03/2023  Time spent: *** minutes  Recommendations for Outpatient Follow-up:  ***  *** if discharging to SNF, indicate specific facility (include homehealth, outpatient follow-up instructions, specific recommendations for PCP to follow-up on, etc.)   Discharge Diagnoses:  Principal Problem:   Colitis Active Problems:   Asthma, chronic obstructive, with acute exacerbation (HCC)   Hypothyroidism   Hypertension   Insulin  dependent type 2 diabetes mellitus (HCC)   CKD stage 3a, GFR 45-59 ml/min (HCC)   Iron  deficiency anemia   Discharge Condition: ***  Diet recommendation: ***  Filed Weights   11/26/23 2131  Weight: 44.8 kg    History of present illness:  ***  Hospital Course:  ***  Procedures: *** (i.e. Studies not automatically included, echos, thoracentesis, etc; not x-rays)  Consultations: ***  Discharge Exam: Vitals:   12/03/23 0854 12/03/23 1225  BP:  (!) 140/69  Pulse:  99  Resp:  16  Temp:  98.9 F (37.2 C)  SpO2: 98% 98%    General: *** Cardiovascular: *** Respiratory: ***  Discharge Instructions   Discharge Instructions     Diet general   Complete by: As directed    Vegetarian   Increase activity slowly   Complete by: As directed    Pulmonary Visit   Complete by: As directed    Hospital follow-up for asthma exacerbation versus COPD exacerbation   Reason for referral: Other Pulmonary      Allergies as of 12/03/2023       Reactions   Penicillins Rash, Other (See Comments)   1980's   Sulfa Antibiotics Rash        Medication List     STOP taking these medications    traMADol  50 MG tablet Commonly known as: ULTRAM        TAKE these medications    acetaminophen  325 MG tablet Commonly known as: TYLENOL  Take 650 mg by mouth every 6 (six) hours as needed for mild  pain.   albuterol  108 (90 Base) MCG/ACT inhaler Commonly known as: VENTOLIN  HFA Inhale 2 puffs into the lungs 3 (three) times daily for 3 days, THEN 2 puffs every 6 (six) hours as needed for wheezing or shortness of breath. Start taking on: December 03, 2023 What changed: See the new instructions.   amLODipine  2.5 MG tablet Commonly known as: NORVASC  Take 1 tablet (2.5 mg total) by mouth daily. Start taking on: December 04, 2023   B-12 SL Place 1 tablet under the tongue daily.   CO Q 10 PO Take 1 capsule by mouth daily.   diclofenac  sodium 1 % Gel Commonly known as: VOLTAREN  Apply 2 g topically 3 (three) times daily as needed (joint pain- affected areas).   donepezil  5 MG tablet Commonly known as: ARICEPT  Take 5 mg by mouth at bedtime.   ezetimibe -simvastatin  10-20 MG tablet Commonly known as: VYTORIN  Take 1 tablet by mouth at bedtime.   FreeStyle Libre 14 Day Sensor Misc Inject 1 Device into the skin every 14 (fourteen) days.   guaiFENesin  600 MG 12 hr tablet Commonly known as: MUCINEX  Take 2 tablets (1,200 mg total) by mouth 2 (two) times daily for 5 days.   HYDROcodone -acetaminophen  5-325 MG tablet Commonly known as: NORCO/VICODIN Take 1 tablet by mouth See admin instructions. Take 1 tablet by mouth with lunch and in the evening   insulin  lispro 100 UNIT/ML KwikPen Commonly  known as: HUMALOG Inject 2-12 Units into the skin See admin instructions. Inject 2-12 units into the skin three times a day with meals, per sliding scale   Januvia 50 MG tablet Generic drug: sitaGLIPtin Take 50 mg by mouth daily.   Lantus  SoloStar 100 UNIT/ML Solostar Pen Generic drug: insulin  glargine Inject 25 Units into the skin at bedtime. What changed: how much to take   latanoprost  0.005 % ophthalmic solution Commonly known as: XALATAN  Place 1 drop into both eyes at bedtime.   levothyroxine  50 MCG tablet Commonly known as: SYNTHROID  Take 1 tablet (50 mcg total) by mouth See admin  instructions. Take 25mcg alternating 50mcg every other day. What changed:  how much to take additional instructions   lidocaine  2 % solution Commonly known as: XYLOCAINE  Use as directed 3 mLs in the mouth or throat daily as needed for mouth pain.   loratadine  10 MG tablet Commonly known as: CLARITIN  Take 1 tablet (10 mg total) by mouth daily. Start taking on: December 04, 2023   megestrol  400 MG/10ML suspension Commonly known as: MEGACE  Take 600 mg by mouth daily before supper.   metoprolol  tartrate 25 MG tablet Commonly known as: LOPRESSOR  Take 1 tablet (25 mg total) by mouth 2 (two) times daily. What changed: when to take this   Myrbetriq  25 MG Tb24 tablet Generic drug: mirabegron  ER Take 25 mg by mouth in the morning.   omeprazole  20 MG capsule Commonly known as: PRILOSEC Take 20 mg by mouth every other day.   ondansetron  4 MG tablet Commonly known as: ZOFRAN  Take 1 tablet (4 mg total) by mouth every 8 (eight) hours as needed for nausea or vomiting.   predniSONE  20 MG tablet Commonly known as: DELTASONE  Take 2 tablets (40 mg total) by mouth daily before breakfast for 1 day, THEN 1 tablet (20 mg total) daily before breakfast for 3 days. Start taking on: December 04, 2023   simethicone  80 MG chewable tablet Commonly known as: MYLICON Chew 1 tablet (80 mg total) by mouth every 6 (six) hours as needed for flatulence.   Symbicort  160-4.5 MCG/ACT inhaler Generic drug: budesonide -formoterol  Inhale 2 puffs by mouth twice daily What changed: when to take this   Vitamin D -3 125 MCG (5000 UT) Tabs Take 5,000 Units by mouth daily.       Allergies  Allergen Reactions   Penicillins Rash and Other (See Comments)    1980's   Sulfa Antibiotics Rash    Follow-up Information     Sun, Vyvyan, MD. Schedule an appointment as soon as possible for a visit in 2 week(s).   Specialty: Family Medicine Why: f/u in 1-2 weeks. Contact information: [address] [place] [address] [phone]         Gaston Hamilton, MD. Schedule an appointment as soon as possible for a visit in 2 week(s).   Specialty: Urology Why: Follow-up in 1 to 2 weeks. Contact information: [address] [place] [address] [phone]         Kristie Lamprey, MD. Schedule an appointment as soon as possible for a visit in 2 week(s).   Specialty: Gastroenterology Contact information: [address] [place] [address] [phone]         [facility] [place]. Schedule an appointment as soon as possible for a visit in 2 week(s).   Specialty: Pulmonology Contact information: [address] [place]  [address] [phone] Additional  information: [address]  [address]  [place], [place] [address]                 The results of significant diagnostics from this hospitalization (including imaging, microbiology, ancillary and laboratory) are listed below for reference.    Significant Diagnostic Studies: DG CHEST PORT 1 VIEW Result Date: 11/30/2023 CLINICAL DATA:  Wheezing.  History of colitis EXAM: PORTABLE CHEST 1 VIEW COMPARISON:  Chest x-ray and CTA chest 11/26/2023. FINDINGS: Hyperinflation. Film is rotated to the left. No consolidation,. Normal cardiopericardial silhouette. Calcifications along the tracheobronchial tree. Some kyphosis. IMPRESSION: Hyperinflation. No acute cardiopulmonary disease. Rotated radiograph. Electronically Signed   By: Ranell Bring M.D.   On: 11/30/2023 18:58   CT Angio Abd/Pel w/ and/or w/o Result Date: 11/29/2023 CLINICAL DATA:  Suspected chronic mesenteric ischemia abdominal pain, loose stools, elevated lactate with concern for colitis and possible ischemia EXAM: CTA ABDOMEN AND PELVIS WITHOUT AND WITH CONTRAST TECHNIQUE: Multidetector CT imaging of the abdomen and pelvis was performed using the standard protocol during bolus administration of  intravenous contrast. Multiplanar reconstructed images and MIPs were obtained and reviewed to evaluate the vascular anatomy. RADIATION DOSE REDUCTION: This exam was performed according to the departmental dose-optimization program which includes automated exposure control, adjustment of the mA and/or kV according to patient size and/or use of iterative reconstruction technique. CONTRAST:  80mL OMNIPAQUE  IOHEXOL  350 MG/ML SOLN COMPARISON:  November 26, 2023 and prior studies FINDINGS: VASCULAR Aorta: Normal caliber aorta with mild atherosclerotic plaque and without aneurysm, dissection, vasculitis or significant stenosis. Celiac: Patent. Minimal atherosclerotic plaque with no significant stenosis. SMA: Patent. Minimal atherosclerotic plaque with no significant stenosis. Renals: Patent with no significant stenosis. IMA: Patent with no significant stenosis. Inflow: Bilateral common, external, and internal iliac arteries are patent without significant stenosis. Proximal Outflow: Bilateral common femoral and visualized portions of the superficial and profunda femoral arteries are patent without significant stenosis. Veins: Portal vein, splenic vein, and SMV are patent. No portal venous gas. Image the [facility] veins are patent. Review of the MIP images confirms the above findings. NON-VASCULAR Lower chest: Trace left pleural effusion. Hepatobiliary: No suspicious liver lesion. No gallstones, gallbladder wall thickening, or biliary dilatation. Pancreas: Unremarkable. Spleen: Unremarkable. Adrenals/Urinary Tract: Adrenal glands are unremarkable. No nephrolithiasis or hydronephrosis. Stomach/Bowel: Nonspecific mild colonic wall thickening involving predominantly the sigmoid colon again identified. Unremarkable appearance of the transverse colon on the study. Appendix is unremarkable. No bowel obstruction. No pneumatosis. Small hiatal hernia. Lymphatic: No lymphadenopathy by size criteria. Reproductive: Unremarkable. Other: No  free air or free fluid. Musculoskeletal: Multilevel degenerative vertebral changes. IMPRESSION: VASCULAR Minimal atherosclerotic disease with no arterial aneurysms, dissections, occlusions, or significant stenosis identified. No portal or mesenteric venous thrombosis identified. NON-VASCULAR 1. Improved appearance of the transverse colon on the study. Likely persistent colitis involving the sigmoid colon. Electronically Signed   By: Michaeline Blanch M.D.   On: 11/29/2023 13:06   CT Angio Chest PE W and/or Wo Contrast Result Date: 11/26/2023 CLINICAL DATA:  PE suspected, sepsis EXAM: CT ANGIOGRAPHY CHEST CT ABDOMEN AND PELVIS WITH CONTRAST TECHNIQUE: Multidetector CT imaging of the chest was performed using the standard protocol during bolus administration of intravenous contrast. Multiplanar CT image reconstructions and MIPs were obtained to evaluate the vascular anatomy. Multidetector CT imaging of the abdomen and pelvis was performed using the standard protocol during bolus administration of intravenous contrast. RADIATION DOSE REDUCTION: This exam was performed according to the departmental dose-optimization program which includes automated exposure control,  adjustment of the mA and/or kV according to patient size and/or use of iterative reconstruction technique. CONTRAST:  65mL OMNIPAQUE  IOHEXOL  350 MG/ML SOLN COMPARISON:  02/12/2023 FINDINGS: CT CHEST ANGIOGRAM FINDINGS Cardiovascular: Satisfactory opacification of the pulmonary arteries to the segmental level. No evidence of pulmonary embolism. Normal heart size. No pericardial effusion. Aortic atherosclerosis. Mediastinum/Nodes: No enlarged mediastinal, hilar, or axillary lymph nodes. Small hiatal hernia. Thyroid  gland, trachea, and esophagus demonstrate no significant findings. Lungs/Pleura: Benjamine appearing bandlike scarring of the right middle lobe and left lower lobe (series 4, image 71). No pleural effusion or pneumothorax. Musculoskeletal: No chest wall  abnormality. No acute osseous findings. Review of the MIP images confirms the above findings. CT ABDOMEN PELVIS FINDINGS Hepatobiliary: No solid liver abnormality is seen. No gallstones, gallbladder wall thickening, or biliary dilatation. Pancreas: Unremarkable. No pancreatic ductal dilatation or surrounding inflammatory changes. Spleen: Normal in size without significant abnormality. Adrenals/Urinary Tract: Adrenal glands are unremarkable. Kidneys are normal, without renal calculi, solid lesion, or hydronephrosis. Bladder is unremarkable. Stomach/Bowel: Stomach is within normal limits. Appendix appears normal. The colon is decompressed. There is some suggestion of long segment wall thickening and mucosal hyperenhancement, most conspicuously in the transverse colon (series 9, image 57), and in the sigmoid colon (series 6, image 47). Occasional sigmoid diverticula. Vascular/Lymphatic: Aortic atherosclerosis. No enlarged abdominal or pelvic lymph nodes. Reproductive: No mass or other significant abnormality. Other: No abdominal wall hernia or abnormality. No ascites. Musculoskeletal: No acute or significant osseous findings. Review of the MIP images confirms the above findings. IMPRESSION: 1. Negative examination for pulmonary embolism. 2. The colon is decompressed. There is some suggestion of long segment wall thickening and mucosal hyperenhancement, most conspicuously in the transverse colon, and in the sigmoid colon. Correlate for signs and symptoms of colitis. 3. Small hiatal hernia. Aortic Atherosclerosis (ICD10-I70.0). Electronically Signed   By: Marolyn JONETTA Jaksch M.D.   On: 11/26/2023 20:48   CT ABDOMEN PELVIS W CONTRAST Result Date: 11/26/2023 CLINICAL DATA:  PE suspected, sepsis EXAM: CT ANGIOGRAPHY CHEST CT ABDOMEN AND PELVIS WITH CONTRAST TECHNIQUE: Multidetector CT imaging of the chest was performed using the standard protocol during bolus administration of intravenous contrast. Multiplanar CT image  reconstructions and MIPs were obtained to evaluate the vascular anatomy. Multidetector CT imaging of the abdomen and pelvis was performed using the standard protocol during bolus administration of intravenous contrast. RADIATION DOSE REDUCTION: This exam was performed according to the departmental dose-optimization program which includes automated exposure control, adjustment of the mA and/or kV according to patient size and/or use of iterative reconstruction technique. CONTRAST:  65mL OMNIPAQUE  IOHEXOL  350 MG/ML SOLN COMPARISON:  02/12/2023 FINDINGS: CT CHEST ANGIOGRAM FINDINGS Cardiovascular: Satisfactory opacification of the pulmonary arteries to the segmental level. No evidence of pulmonary embolism. Normal heart size. No pericardial effusion. Aortic atherosclerosis. Mediastinum/Nodes: No enlarged mediastinal, hilar, or axillary lymph nodes. Small hiatal hernia. Thyroid  gland, trachea, and esophagus demonstrate no significant findings. Lungs/Pleura: Benjamine appearing bandlike scarring of the right middle lobe and left lower lobe (series 4, image 71). No pleural effusion or pneumothorax. Musculoskeletal: No chest wall abnormality. No acute osseous findings. Review of the MIP images confirms the above findings. CT ABDOMEN PELVIS FINDINGS Hepatobiliary: No solid liver abnormality is seen. No gallstones, gallbladder wall thickening, or biliary dilatation. Pancreas: Unremarkable. No pancreatic ductal dilatation or surrounding inflammatory changes. Spleen: Normal in size without significant abnormality. Adrenals/Urinary Tract: Adrenal glands are unremarkable. Kidneys are normal, without renal calculi, solid lesion, or hydronephrosis. Bladder is unremarkable. Stomach/Bowel: Stomach is within  normal limits. Appendix appears normal. The colon is decompressed. There is some suggestion of long segment wall thickening and mucosal hyperenhancement, most conspicuously in the transverse colon (series 9, image 57), and in the  sigmoid colon (series 6, image 47). Occasional sigmoid diverticula. Vascular/Lymphatic: Aortic atherosclerosis. No enlarged abdominal or pelvic lymph nodes. Reproductive: No mass or other significant abnormality. Other: No abdominal wall hernia or abnormality. No ascites. Musculoskeletal: No acute or significant osseous findings. Review of the MIP images confirms the above findings. IMPRESSION: 1. Negative examination for pulmonary embolism. 2. The colon is decompressed. There is some suggestion of long segment wall thickening and mucosal hyperenhancement, most conspicuously in the transverse colon, and in the sigmoid colon. Correlate for signs and symptoms of colitis. 3. Small hiatal hernia. Aortic Atherosclerosis (ICD10-I70.0). Electronically Signed   By: Marolyn JONETTA Jaksch M.D.   On: 11/26/2023 20:48   DG Chest Port 1 View Result Date: 11/26/2023 CLINICAL DATA:  Chest pain EXAM: PORTABLE CHEST 1 VIEW COMPARISON:  06/12/2021 FINDINGS: Stable pulmonary hyperinflation in keeping with changes of underlying COPD. New nodular opacity noted at the left peripheral lung base, indeterminate. Lungs are otherwise clear. No pneumothorax or pleural effusion. Cardiac size within normal limits. No acute bone abnormality. IMPRESSION: 1. COPD. 2. New nodular opacity at the left peripheral lung base, indeterminate. Standard two view chest radiograph would be helpful to exclude a imaging artifact after the patient's acute issues have resolved. Electronically Signed   By: Dorethia Molt M.D.   On: 11/26/2023 19:55    Microbiology: Recent Results (from the past 240 hours)  Resp panel by RT-PCR (RSV, Flu A&B, Covid) Anterior Nasal Swab     Status: None   Collection Time: 11/26/23  7:37 PM   Specimen: Anterior Nasal Swab  Result Value Ref Range Status   SARS Coronavirus 2 by RT PCR NEGATIVE NEGATIVE Final    Comment: (NOTE) SARS-CoV-2 target nucleic acids are NOT DETECTED.  The SARS-CoV-2 RNA is generally detectable in upper  respiratory specimens during the acute phase of infection. The lowest concentration of SARS-CoV-2 viral copies this assay can detect is 138 copies/mL. A negative result does not preclude SARS-Cov-2 infection and should not be used as the sole basis for treatment or other patient management decisions. A negative result may occur with  improper specimen collection/handling, submission of specimen other than nasopharyngeal swab, presence of viral mutation(s) within the areas targeted by this assay, and inadequate number of viral copies(<138 copies/mL). A negative result must be combined with clinical observations, patient history, and epidemiological information. The expected result is Negative.  Fact Sheet for Patients:  BloggerCourse.com  Fact Sheet for Healthcare Providers:  SeriousBroker.it  This test is no t yet approved or cleared by the [place]  FDA and  has been authorized for detection and/or diagnosis of SARS-CoV-2 by FDA under an Emergency Use Authorization (EUA). This EUA will remain  in effect (meaning this test can be used) for the duration of the COVID-19 declaration under Section 564(b)(1) of the Act, 21 U.S.C.section 360bbb-3(b)(1), unless the authorization is terminated  or revoked sooner.       Influenza A by PCR NEGATIVE NEGATIVE Final   Influenza B by PCR NEGATIVE NEGATIVE Final    Comment: (NOTE) The Xpert Xpress SARS-CoV-2/FLU/RSV plus assay is intended as an aid in the diagnosis of influenza from Nasopharyngeal swab specimens and should not be used as a sole basis for treatment. Nasal washings and aspirates are unacceptable for Xpert Xpress SARS-CoV-2/FLU/RSV testing.  Fact  Sheet for Patients: BloggerCourse.com  Fact Sheet for Healthcare Providers: SeriousBroker.it  This test is not yet approved or cleared by the [place]  FDA and has been  authorized for detection and/or diagnosis of SARS-CoV-2 by FDA under an Emergency Use Authorization (EUA). This EUA will remain in effect (meaning this test can be used) for the duration of the COVID-19 declaration under Section 564(b)(1) of the Act, 21 U.S.C. section 360bbb-3(b)(1), unless the authorization is terminated or revoked.     Resp Syncytial Virus by PCR NEGATIVE NEGATIVE Final    Comment: (NOTE) Fact Sheet for Patients: BloggerCourse.com  Fact Sheet for Healthcare Providers: SeriousBroker.it  This test is not yet approved or cleared by the [place]  FDA and has been authorized for detection and/or diagnosis of SARS-CoV-2 by FDA under an Emergency Use Authorization (EUA). This EUA will remain in effect (meaning this test can be used) for the duration of the COVID-19 declaration under Section 564(b)(1) of the Act, 21 U.S.C. section 360bbb-3(b)(1), unless the authorization is terminated or revoked.  Performed at [facility], [address], [place], [place] [address]   Blood Culture (routine x 2)     Status: None   Collection Time: 11/26/23  7:48 PM   Specimen: BLOOD  Result Value Ref Range Status   Specimen Description   Final    BLOOD RIGHT ANTECUBITAL Performed at [facility], [address] [place], [place] [address]    Special Requests   Final    BOTTLES DRAWN AEROBIC AND ANAEROBIC Blood Culture results may not be optimal due to an inadequate volume of blood received in culture bottles   Culture   Final    NO GROWTH 5 DAYS Performed at [facility], [address], [place], [place] [address]    Report Status 12/01/2023 FINAL  Final  Blood Culture (routine x 2)     Status: None   Collection Time: 11/26/23  8:09 PM   Specimen: BLOOD  Result Value Ref Range Status   Specimen Description   Final    BLOOD LEFT ANTECUBITAL Performed at [facility], [address] [place], [place] [address]    Special Requests   Final    BOTTLES DRAWN AEROBIC AND ANAEROBIC Blood Culture results may not be optimal due to an inadequate volume of blood received in culture bottles Performed at [facility], [address], [place], [place] [address]    Culture   Final    NO GROWTH 5 DAYS Performed at [facility], [address], [place], [place] [address]    Report Status 12/01/2023 FINAL  Final  Respiratory (~20 pathogens) panel by PCR     Status: None   Collection Time: 11/27/23 10:40 PM   Specimen: Nasopharyngeal Swab; Respiratory  Result Value Ref Range Status   Adenovirus NOT DETECTED NOT DETECTED Final   Coronavirus 229E NOT DETECTED NOT DETECTED Final    Comment: (NOTE) The Coronavirus on the Respiratory Panel, DOES NOT test for the novel  Coronavirus (2019 nCoV)    Coronavirus HKU1 NOT DETECTED NOT DETECTED Final   Coronavirus NL63 NOT DETECTED NOT DETECTED Final   Coronavirus OC43 NOT DETECTED NOT DETECTED Final   Metapneumovirus NOT DETECTED NOT DETECTED Final   Rhinovirus / Enterovirus NOT DETECTED NOT DETECTED Final   Influenza A NOT DETECTED NOT DETECTED Final   Influenza B NOT DETECTED NOT DETECTED Final   Parainfluenza Virus 1 NOT DETECTED NOT DETECTED Final   Parainfluenza  Virus 2 NOT DETECTED NOT DETECTED Final   Parainfluenza Virus 3 NOT DETECTED NOT DETECTED Final   Parainfluenza Virus 4 NOT DETECTED NOT DETECTED Final   Respiratory Syncytial Virus NOT DETECTED NOT DETECTED Final   Bordetella pertussis NOT DETECTED NOT DETECTED Final   Bordetella Parapertussis NOT DETECTED NOT DETECTED Final   Chlamydophila pneumoniae NOT DETECTED NOT DETECTED Final   Mycoplasma pneumoniae NOT DETECTED NOT DETECTED Final    Comment: Performed at [facility], [address], [place], [place] [address]  Gastrointestinal Panel by PCR , Stool     Status: None   Collection Time: 11/29/23  1:18 PM    Specimen: Stool  Result Value Ref Range Status   Campylobacter species NOT DETECTED NOT DETECTED Final   Plesimonas shigelloides NOT DETECTED NOT DETECTED Final   Salmonella species NOT DETECTED NOT DETECTED Final   Yersinia enterocolitica NOT DETECTED NOT DETECTED Final   Vibrio species NOT DETECTED NOT DETECTED Final   Vibrio cholerae NOT DETECTED NOT DETECTED Final   Enteroaggregative E coli (EAEC) NOT DETECTED NOT DETECTED Final   Enteropathogenic E coli (EPEC) NOT DETECTED NOT DETECTED Final   Enterotoxigenic E coli (ETEC) NOT DETECTED NOT DETECTED Final   Shiga like toxin producing E coli (STEC) NOT DETECTED NOT DETECTED Final   Shigella/Enteroinvasive E coli (EIEC) NOT DETECTED NOT DETECTED Final   Cryptosporidium NOT DETECTED NOT DETECTED Final   Cyclospora cayetanensis NOT DETECTED NOT DETECTED Final   Entamoeba histolytica NOT DETECTED NOT DETECTED Final   Giardia lamblia NOT DETECTED NOT DETECTED Final   Adenovirus F40/41 NOT DETECTED NOT DETECTED Final   Astrovirus NOT DETECTED NOT DETECTED Final   Norovirus GI/GII NOT DETECTED NOT DETECTED Final   Rotavirus A NOT DETECTED NOT DETECTED Final   Sapovirus (I, II, IV, and V) NOT DETECTED NOT DETECTED Final    Comment: Performed at [facility], [address] [place], [place] [address]     Labs: Basic Metabolic Panel: Recent Labs  Lab 11/29/23 0453 11/30/23 0535 12/01/23 0513 12/02/23 0442 12/03/23 0544  NA 136 133* 137 140 138  K 3.4* 3.7 3.6 3.1* 3.9  CL 103 98 100 103 99  CO2 25 25 25 26 28   GLUCOSE 119* 106* 199* 91 59*  BUN 19 17 15 17 20   CREATININE 0.82 0.69 0.74 0.81 0.81  CALCIUM  9.0 8.9 8.8* 8.7* 9.4  MG 1.9 2.0 2.0  --   --   PHOS  --  2.4*  --   --   --    Liver Function Tests: Recent Labs  Lab 11/26/23 1937 11/28/23 0455  AST 21 15  ALT 14 12  ALKPHOS 49 27*  BILITOT 0.5 0.5  PROT 7.5 5.5*  ALBUMIN 4.2 2.7*   Recent Labs  Lab 11/26/23 1937  LIPASE 17   No results for  input(s): AMMONIA in the last 168 hours. CBC: Recent Labs  Lab 11/28/23 1911 11/30/23 0535 12/01/23 0513 12/02/23 0442 12/03/23 0544  WBC 18.4* 15.4* 17.0* 20.8* 23.3*  NEUTROABS 15.7* 11.1* 13.7* 15.5* 15.9*  HGB 10.0* 11.1* 11.5* 10.8* 12.0  HCT 30.8* 34.3* 35.2* 33.2* 37.9  MCV 93.3 91.0 92.4 93.5 94.3  PLT 281 296 326 307 358   Cardiac Enzymes: No results for input(s): CKTOTAL, CKMB, CKMBINDEX, TROPONINI in the last 168 hours. BNP: BNP (last 3 results) No results for input(s): BNP in the last 8760 hours.  ProBNP (last 3 results) No results for input(s): PROBNP in the last  8760 hours.  CBG: Recent Labs  Lab 12/02/23 2051 12/03/23 0738 12/03/23 0912 12/03/23 1226 12/03/23 1450  GLUCAP 226* 65* 208* 425* 450*       Signed:  Toribio Hummer MD.  [facility] 12/03/2023, 2:55 PM

## 2023-12-03 NOTE — Plan of Care (Incomplete)
  Problem: Clinical Measurements: Goal: Will remain free from infection Outcome: Progressing Goal: Diagnostic test results will improve Outcome: Progressing Goal: Respiratory complications will improve Outcome: Progressing   Problem: Activity: Goal: Risk for activity intolerance will decrease Outcome: Progressing   Problem: Nutrition: Goal: Adequate nutrition will be maintained Outcome: Progressing   Problem: Elimination: Goal: Will not experience complications related to bowel motility Outcome: Progressing   Problem: Safety: Goal: Ability to remain free from injury will improve Outcome: Progressing   Problem: Education: Goal: Ability to describe self-care measures that may prevent or decrease complications (Diabetes Survival Skills Education) will improve Outcome: Progressing   Problem: Coping: Goal: Ability to adjust to condition or change in health will improve Outcome: Progressing   Problem: Fluid Volume: Goal: Ability to maintain a balanced intake and output will improve Outcome: Progressing   Problem: Metabolic: Goal: Ability to maintain appropriate glucose levels will improve Outcome: Progressing   Problem: Nutritional: Goal: Maintenance of adequate nutrition will improve Outcome: Progressing   Problem: Tissue Perfusion: Goal: Adequacy of tissue perfusion will improve Outcome: Progressing   Problem: Clinical Measurements: Goal: Signs and symptoms of infection will decrease Outcome: Progressing   Problem: Respiratory: Goal: Ability to maintain adequate ventilation will improve Outcome: Progressing   Problem: Education: Goal: Knowledge of General Education information will improve Description: Including pain rating scale, medication(s)/side effects and non-pharmacologic comfort measures Outcome: Adequate for Discharge   Problem: Health Behavior/Discharge Planning: Goal: Ability to manage health-related needs will improve Outcome: Adequate for  Discharge   Problem: Clinical Measurements: Goal: Ability to maintain clinical measurements within normal limits will improve Outcome: Adequate for Discharge Goal: Cardiovascular complication will be avoided Outcome: Adequate for Discharge   Problem: Coping: Goal: Level of anxiety will decrease Outcome: Adequate for Discharge   Problem: Elimination: Goal: Will not experience complications related to urinary retention Outcome: Adequate for Discharge   Problem: Pain Managment: Goal: General experience of comfort will improve and/or be controlled Outcome: Adequate for Discharge   Problem: Skin Integrity: Goal: Risk for impaired skin integrity will decrease Outcome: Adequate for Discharge   Problem: Skin Integrity: Goal: Risk for impaired skin integrity will decrease Outcome: Adequate for Discharge

## 2023-12-04 DIAGNOSIS — I10 Essential (primary) hypertension: Secondary | ICD-10-CM | POA: Diagnosis not present

## 2023-12-04 DIAGNOSIS — D509 Iron deficiency anemia, unspecified: Secondary | ICD-10-CM | POA: Diagnosis not present

## 2023-12-04 DIAGNOSIS — J441 Chronic obstructive pulmonary disease with (acute) exacerbation: Secondary | ICD-10-CM | POA: Diagnosis not present

## 2023-12-04 DIAGNOSIS — K529 Noninfective gastroenteritis and colitis, unspecified: Secondary | ICD-10-CM | POA: Diagnosis not present

## 2023-12-04 LAB — BASIC METABOLIC PANEL WITH GFR
Anion gap: 11 (ref 5–15)
BUN: 22 mg/dL (ref 8–23)
CO2: 24 mmol/L (ref 22–32)
Calcium: 9.2 mg/dL (ref 8.9–10.3)
Chloride: 101 mmol/L (ref 98–111)
Creatinine, Ser: 0.77 mg/dL (ref 0.44–1.00)
GFR, Estimated: >60 mL/min (ref >60)
Glucose, Bld: 110 mg/dL — ABNORMAL HIGH (ref 70–99)
Potassium: 4 mmol/L (ref 3.5–5.1)
Sodium: 136 mmol/L (ref 135–145)

## 2023-12-04 LAB — CBC WITH DIFFERENTIAL/PLATELET
Abs Immature Granulocytes: 2.26 K/uL — ABNORMAL HIGH (ref 0.00–0.07)
Basophils Absolute: 0 K/uL (ref 0.0–0.1)
Basophils Relative: 0 %
Eosinophils Absolute: 0 K/uL (ref 0.0–0.5)
Eosinophils Relative: 0 %
HCT: 36.2 % (ref 36.0–46.0)
Hemoglobin: 11.8 g/dL — ABNORMAL LOW (ref 12.0–15.0)
Immature Granulocytes: 10 %
Lymphocytes Relative: 11 %
Lymphs Abs: 2.5 K/uL (ref 0.7–4.0)
MCH: 30.4 pg (ref 26.0–34.0)
MCHC: 32.6 g/dL (ref 30.0–36.0)
MCV: 93.3 fL (ref 80.0–100.0)
Monocytes Absolute: 1.6 K/uL — ABNORMAL HIGH (ref 0.1–1.0)
Monocytes Relative: 8 %
Neutro Abs: 15.4 K/uL — ABNORMAL HIGH (ref 1.7–7.7)
Neutrophils Relative %: 71 %
Platelets: 322 K/uL (ref 150–400)
RBC: 3.88 MIL/uL (ref 3.87–5.11)
RDW: 13.2 % (ref 11.5–15.5)
WBC: 21.8 K/uL — ABNORMAL HIGH (ref 4.0–10.5)
nRBC: 0.3 % — ABNORMAL HIGH (ref 0.0–0.2)

## 2023-12-04 LAB — GLUCOSE, CAPILLARY
Glucose-Capillary: 77 mg/dL (ref 70–99)
Glucose-Capillary: 124 mg/dL — ABNORMAL HIGH (ref 70–99)
Glucose-Capillary: 99 mg/dL (ref 70–99)

## 2023-12-04 MED ORDER — OMEPRAZOLE 20 MG PO CPDR: 20.0000 mg | DELAYED_RELEASE_CAPSULE | ORAL | 1 refills | Status: DC

## 2023-12-04 MED ORDER — INSULIN ASPART 100 UNIT/ML IJ SOLN: 0.0000 [IU] | Freq: Three times a day (TID) | INTRAMUSCULAR | Status: DC

## 2023-12-04 MED ORDER — SYMBICORT 160-4.5 MCG/ACT IN AERO
2.0000 | INHALATION_SPRAY | Freq: Two times a day (BID) | RESPIRATORY_TRACT | 1 refills | Status: DC
Start: 2023-12-04 — End: 2023-12-28

## 2023-12-04 MED ORDER — LANTUS SOLOSTAR 100 UNIT/ML ~~LOC~~ SOPN: 20.0000 [IU] | PEN_INJECTOR | Freq: Every day | SUBCUTANEOUS | 0 refills | Status: DC

## 2023-12-04 NOTE — TOC Transition Note (Signed)
 Transition of Care [facility]) - Discharge Note   Patient Details  Name: Hannah [name] MRN: [id number] Date of Birth: Dec 16, 1932  Transition of Care [facility]) CM/SW Contact:  Sonda Manuella Quill, RN Phone Number: 12/04/2023, 10:06 AM   Clinical Narrative:    D/C orders received; also orders for RW, shower chair, HHPt/OT; spoke w/ pt and family in room; pt's dtr Hannah [name] agreed to receive recc services; she also agreed to receive RW; she says pt does not need shower chair; pt's dtr does not have agency preference for DME or [facility] services; referral given to Jermaine at [place]; he says RW will be delivered to pt's room; referral for HHPT/OT given to [facility]; he says agency can provide services; agency contact info placed in follow up provider section of d/c instructions; pt/family notified; no TOC needs.   Final next level of care: Home w Home Health Services Barriers to Discharge: No Barriers Identified   Patient Goals and CMS Choice            Discharge Placement                    Patient and family notified of of transfer: 12/04/23  Discharge Plan and Services Additional resources added to the After Visit Summary for   In-house Referral: Clinical Social Work              DME Arranged: Vannie rolling DME Agency: [employer] Date DME Agency Contacted: 12/04/23 Time DME Agency Contacted: 1005 Representative spoke with at DME Agency: [place] HH Arranged: PT, OT HH Agency: [facility] Date [facility] Agency Contacted: 12/04/23 Time HH Agency Contacted: 1006 Representative spoke with at [facility] Agency: [place]  Social Drivers of Health (SDOH) Interventions SDOH Screenings   Food Insecurity: No Food Insecurity (11/27/2023)  Housing: Low Risk  (11/27/2023)  Transportation Needs: No Transportation Needs (11/27/2023)  Utilities: Not At Risk (11/27/2023)  Social Connections: Unknown (11/27/2023)  Tobacco Use: Low Risk  (11/27/2023)     Readmission Risk  Interventions     No data to display

## 2023-12-04 NOTE — Progress Notes (Signed)
 Patient refused most of her meds this shift stating too much medicine, no more.  Patient rested well, no BM's this shift.

## 2023-12-04 NOTE — Plan of Care (Signed)
  Problem: Education: Goal: Knowledge of General Education information will improve Description: Including pain rating scale, medication(s)/side effects and non-pharmacologic comfort measures Outcome: Progressing   Problem: Coping: Goal: Level of anxiety will decrease Outcome: Progressing   Problem: Pain Managment: Goal: General experience of comfort will improve and/or be controlled Outcome: Progressing

## 2023-12-04 NOTE — Discharge Summary (Addendum)
 Physician Discharge Summary  Hannah [name] FMW:[id number] DOB: [birth year] DOA: 11/26/2023  PCP: Sun, Vyvyan, MD  Admit date: 11/26/2023 Discharge date: 12/04/2023  Time spent: 60 minutes  Recommendations for Outpatient Follow-up:  Follow-up with Sun, Vyvyan, MD in 2 weeks.  On follow-up patient will need a basic metabolic profile done to follow-up on electrolytes and renal function.  Patient will need a CBC done to follow-up on counts.  Patient's diabetes will need to be reassessed on follow-up.  Patient's blood pressure also needs to be reassessed on follow-up. Follow-up with Dr.MacDiarmid, urology in 1 to 2 weeks for probable urodynamic studies and further evaluation of urinary incontinence. Follow-up with Dr. Kristie, gastroenterology in 2 weeks. Follow-up with [facility] in 2 weeks.   Discharge Diagnoses:  Principal Problem:   Colitis Active Problems:   Asthma, chronic obstructive, with acute exacerbation (HCC)   Hypothyroidism   Hypertension   Insulin  dependent type 2 diabetes mellitus (HCC)   CKD stage 3a, GFR 45-59 ml/min (HCC)   Iron  deficiency anemia   Discharge Condition: Stable and improved.  Diet recommendation: Carb modified diet  Filed Weights   11/26/23 2131  Weight: 44.8 kg    History of present illness:  HPI per Dr. Charlton Hannah [name] is an 89 y.o. female with medical history significant for buccal cancer status postresection and radiation, hypertension, hypothyroidism, type 2 diabetes mellitus, chronic lumbar radiculopathy, asthma, and CKD 3A who presents with abdominal pain.   Patient complains of pain across her upper abdomen.  She also reports experiencing diarrhea every time she sits down to urinate.  She denies vomiting.  She also notes feeling short of breath recently but denies any significant cough.   [facility] ED Course: Upon arrival to the ED, patient is found to be afebrile and saturating 90% on room air with tachypnea,  tachycardia, and elevated BP.  Labs are most notable for glucose 281, creatinine 1.14, WBC 15,800, and lactic acid 3.4.  CTA chest is negative for PE.  CT of the abdomen and pelvis reveals long segment of colonic wall thickening and mural hyperenhancement.   Blood cultures were collected and the patient was given 500 mL of NS, 2 g IV magnesium , Zofran , morphine , Lopressor , vancomycin , cefepime , Flagyl , DuoNeb, albuterol , and IV Solu-Medrol .  She was transferred to [facility] for admission.  Hospital Course:  1 sepsis secondary to colitis -Patient presented with abdominal pain, noted to have diarrhea prior to admission. -Patient on admission noted to have met criteria for sepsis with tachycardia, tachypnea and a leukocytosis. - CT abdomen and pelvis done concerning for colitis. -Per RN patient with decreased volume of stool with improvement with consistency. - GI pathogen panel negative. - Per RN C. difficile unable to be obtained as patient with no further watery diarrhea with improvement with stool consistency. - Patient completed 7-day course of IV Rocephin  and IV Flagyl  during the hospitalization improved clinically.   -Sepsis physiology had resolved by day of discharge. - Patient seen by GI during the hospitalization and followed by GI.  -Patient was discharged home in stable improved condition with outpatient follow-up with GI, 2 weeks postdischarge.   2.  Acute asthma exacerbation versus COPD exacerbation -Noted with wheezing on examination on 11/30/2023. - Chest x-ray obtained negative for any acute infiltrate. -Patient initially was on Breo Ellipta  which was subsequently discontinued patient placed on Brovana , Pulmicort  and scheduled DuoNebs as well is IV Solu-Medrol . -Patient improved clinically and IV Solu-Medrol  was transitioned to oral prednisone   taper. -Patient will discharge home on prednisone  taper, home regimen Symbicort , Mucinex , Claritin , Flonase, PPI, albuterol   MDI. -Outpatient follow-up with PCP and pulmonary. -Ambulatory referral made to pulmonary.   3.  Diabetes mellitus type 2 -Hemoglobin A1c noted at 10.2. - CBG fluctuated during the hospitalization.   - Patient placed on long-acting Semglee  and dose was uptitrated to 30 units daily and patient placed on NovoLog  meal coverage as well as sliding scale insulin  due to elevated blood glucose levels as patient was placed on steroids for acute asthma exacerbation.   - CBGs improved with steroid taper.   - Patient will discharge home on home regimen of long-acting insulin  and sliding scale insulin .   - Outpatient follow-up with PCP.    4.  Hypertension - Patient maintained on home regimen metoprolol  25 mg twice daily. - Patient started on low-dose Norvasc  2.5 mg daily for better blood pressure control. - Outpatient follow-up with PCP.   5.  CKD 3A -Stable.   6.  Hypothyroidism - Patient maintained on home regimen Synthroid .    7.  Normocytic anemia/iron  deficiency anemia -Hemoglobin stabilized at 11.8 by day of discharge.  - Status post IV iron .   8.  Urinary incontinence -Family requesting urology assessment for patient's urinary incontinence which has worsened over the past 2 months affecting the quality of life. - Patient was maintained on home regimen of Myrbetriq  25 mg daily. -Urology consulted at family's request who assessed patient and recommended outpatient follow-up and probable urodynamic studies with Dr. Glendia MacDiarmid.   9.??  Livedo reticularis -Reddish-blue, not like/lacelike pattern noted on bilateral thighs. - Outpatient follow-up with PCP.   10.  Hypokalemia -Repleted.      Procedures: CT angiogram chest 11/26/2023 CT abdomen and pelvis 11/26/2023 CT angiogram abdomen and pelvis 11/29/2023 Chest x-ray 11/26/2023, 11/30/2023  Consultations: Gastroenterology: Dr. Kristie 11/28/2023 Urology  Discharge Exam: Vitals:   12/04/23 0742 12/04/23 0744  BP:    Pulse:     Resp:    Temp:    SpO2: 98% 100%    General: NAD Cardiovascular: RRR no murmurs rubs or gallops.  No JVD.  No lower extremity edema. Respiratory: No wheezing.  No crackles.  Fair air movement.  No increased work of breathing.  No use of accessory muscles of respiration.  Discharge Instructions   Discharge Instructions     Diet Carb Modified   Complete by: As directed    Increase activity slowly   Complete by: As directed    Increase activity slowly   Complete by: As directed    Pulmonary Visit   Complete by: As directed    Hospital follow-up for asthma exacerbation versus COPD exacerbation   Reason for referral: Other Pulmonary      Allergies as of 12/04/2023       Reactions   Penicillins Rash, Other (See Comments)   1980's   Sulfa Antibiotics Rash        Medication List     STOP taking these medications    traMADol  50 MG tablet Commonly known as: ULTRAM        TAKE these medications    acetaminophen  325 MG tablet Commonly known as: TYLENOL  Take 650 mg by mouth every 6 (six) hours as needed for mild pain.   albuterol  108 (90 Base) MCG/ACT inhaler Commonly known as: VENTOLIN  HFA Inhale 2 puffs into the lungs 3 (three) times daily for 3 days, THEN 2 puffs every 6 (six) hours as needed for wheezing or shortness of  breath. Start taking on: December 03, 2023 What changed: See the new instructions.   amLODipine  2.5 MG tablet Commonly known as: NORVASC  Take 1 tablet (2.5 mg total) by mouth daily.   B-12 SL Place 1 tablet under the tongue daily.   CO Q 10 PO Take 1 capsule by mouth daily.   diclofenac  sodium 1 % Gel Commonly known as: VOLTAREN  Apply 2 g topically 3 (three) times daily as needed (joint pain- affected areas).   donepezil  5 MG tablet Commonly known as: ARICEPT  Take 5 mg by mouth at bedtime.   ezetimibe -simvastatin  10-20 MG tablet Commonly known as: VYTORIN  Take 1 tablet by mouth at bedtime.   FreeStyle Libre 14 Day Sensor  Misc Inject 1 Device into the skin every 14 (fourteen) days.   guaiFENesin  600 MG 12 hr tablet Commonly known as: MUCINEX  Take 2 tablets (1,200 mg total) by mouth 2 (two) times daily for 5 days.   HYDROcodone -acetaminophen  5-325 MG tablet Commonly known as: NORCO/VICODIN Take 1 tablet by mouth See admin instructions. Take 1 tablet by mouth with lunch and in the evening   insulin  lispro 100 UNIT/ML KwikPen Commonly known as: HUMALOG Inject 2-12 Units into the skin See admin instructions. Inject 2-12 units into the skin three times a day with meals, per sliding scale   Januvia 50 MG tablet Generic drug: sitaGLIPtin Take 50 mg by mouth daily.   Lantus  SoloStar 100 UNIT/ML Solostar Pen Generic drug: insulin  glargine Inject 20 Units into the skin at bedtime.   latanoprost  0.005 % ophthalmic solution Commonly known as: XALATAN  Place 1 drop into both eyes at bedtime.   levothyroxine  50 MCG tablet Commonly known as: SYNTHROID  Take 1 tablet (50 mcg total) by mouth See admin instructions. Take 25mcg alternating 50mcg every other day. What changed:  how much to take additional instructions   lidocaine  2 % solution Commonly known as: XYLOCAINE  Use as directed 3 mLs in the mouth or throat daily as needed for mouth pain.   loratadine  10 MG tablet Commonly known as: CLARITIN  Take 1 tablet (10 mg total) by mouth daily.   megestrol  400 MG/10ML suspension Commonly known as: MEGACE  Take 600 mg by mouth daily before supper.   metoprolol  tartrate 25 MG tablet Commonly known as: LOPRESSOR  Take 1 tablet (25 mg total) by mouth 2 (two) times daily. What changed: when to take this   Myrbetriq  25 MG Tb24 tablet Generic drug: mirabegron  ER Take 25 mg by mouth in the morning.   omeprazole  20 MG capsule Commonly known as: PRILOSEC Take 1 capsule (20 mg total) by mouth every other day.   ondansetron  4 MG tablet Commonly known as: ZOFRAN  Take 1 tablet (4 mg total) by mouth every 8  (eight) hours as needed for nausea or vomiting.   predniSONE  20 MG tablet Commonly known as: DELTASONE  Take 2 tablets (40 mg total) by mouth daily before breakfast for 1 day, THEN 1 tablet (20 mg total) daily before breakfast for 3 days. Start taking on: December 04, 2023   simethicone  80 MG chewable tablet Commonly known as: MYLICON Chew 1 tablet (80 mg total) by mouth every 6 (six) hours as needed for flatulence.   Symbicort  160-4.5 MCG/ACT inhaler Generic drug: budesonide -formoterol  Inhale 2 puffs into the lungs in the morning and at bedtime.   Vitamin D -3 125 MCG (5000 UT) Tabs Take 5,000 Units by mouth daily.               Durable Medical Equipment  (From  admission, onward)           Start     Ordered   12/04/23 0907  For home use only DME Walker rolling  Once       Question Answer Comment  Walker: With 5 Inch Wheels   Patient needs a walker to treat with the following condition Walker as ambulation aid      12/04/23 0907   12/04/23 0907  For home use only DME Shower stool  Once        12/04/23 0907           Allergies  Allergen Reactions   Penicillins Rash and Other (See Comments)    1980's   Sulfa Antibiotics Rash    Follow-up Information     Sun, Vyvyan, MD. Schedule an appointment as soon as possible for a visit in 2 week(s).   Specialty: Family Medicine Why: f/u in 1-2 weeks. Contact information: [address] [place] [address] [phone]         Gaston Hamilton, MD. Schedule an appointment as soon as possible for a visit in 2 week(s).   Specialty: Urology Why: Follow-up in 1 to 2 weeks. Contact information: [address] [place] [address] [phone]         Kristie Lamprey, MD. Schedule an appointment as soon as possible for a visit in 2 week(s).   Specialty: Gastroenterology Contact information: [address] [place] [address] [phone]         [facility]. Schedule an appointment as soon as possible for a visit in 2 week(s).   Specialty: Pulmonology Contact information: [address] [place] [address] [place]  [address] [phone] Additional information: [address]  [address]  [place], [place] [address]                 The results of significant diagnostics from this hospitalization (including imaging, microbiology, ancillary and laboratory) are listed below for reference.    Significant Diagnostic Studies: DG CHEST PORT 1 VIEW Result Date: 11/30/2023 CLINICAL DATA:  Wheezing.  History of colitis EXAM: PORTABLE CHEST 1 VIEW COMPARISON:  Chest x-ray and CTA chest 11/26/2023. FINDINGS: Hyperinflation. Film is rotated to the left. No consolidation,. Normal cardiopericardial silhouette. Calcifications along the tracheobronchial tree. Some kyphosis. IMPRESSION: Hyperinflation. No acute cardiopulmonary disease. Rotated radiograph. Electronically Signed   By: Ranell Bring M.D.   On: 11/30/2023 18:58   CT Angio Abd/Pel w/ and/or w/o Result Date: 11/29/2023 CLINICAL DATA:  Suspected chronic mesenteric ischemia abdominal pain, loose stools, elevated lactate with concern for colitis and possible ischemia EXAM: CTA ABDOMEN AND PELVIS WITHOUT AND WITH CONTRAST TECHNIQUE: Multidetector CT imaging of the abdomen and pelvis was performed using the standard protocol during bolus administration of intravenous contrast. Multiplanar reconstructed images and MIPs were obtained and reviewed to evaluate the vascular anatomy. RADIATION DOSE REDUCTION: This exam was performed according to the departmental dose-optimization program which includes automated exposure control, adjustment of the mA and/or kV according to patient size and/or use of iterative reconstruction technique. CONTRAST:  80mL OMNIPAQUE  IOHEXOL  350 MG/ML SOLN COMPARISON:  November 26, 2023 and prior studies FINDINGS: VASCULAR Aorta: Normal caliber aorta with  mild atherosclerotic plaque and without aneurysm, dissection, vasculitis or significant stenosis. Celiac: Patent. Minimal atherosclerotic plaque with no significant stenosis. SMA: Patent. Minimal atherosclerotic plaque with no significant stenosis. Renals: Patent with no significant stenosis. IMA: Patent with no significant  stenosis. Inflow: Bilateral common, external, and internal iliac arteries are patent without significant stenosis. Proximal Outflow: Bilateral common femoral and visualized portions of the superficial and profunda femoral arteries are patent without significant stenosis. Veins: Portal vein, splenic vein, and SMV are patent. No portal venous gas. Image the [facility] veins are patent. Review of the MIP images confirms the above findings. NON-VASCULAR Lower chest: Trace left pleural effusion. Hepatobiliary: No suspicious liver lesion. No gallstones, gallbladder wall thickening, or biliary dilatation. Pancreas: Unremarkable. Spleen: Unremarkable. Adrenals/Urinary Tract: Adrenal glands are unremarkable. No nephrolithiasis or hydronephrosis. Stomach/Bowel: Nonspecific mild colonic wall thickening involving predominantly the sigmoid colon again identified. Unremarkable appearance of the transverse colon on the study. Appendix is unremarkable. No bowel obstruction. No pneumatosis. Small hiatal hernia. Lymphatic: No lymphadenopathy by size criteria. Reproductive: Unremarkable. Other: No free air or free fluid. Musculoskeletal: Multilevel degenerative vertebral changes. IMPRESSION: VASCULAR Minimal atherosclerotic disease with no arterial aneurysms, dissections, occlusions, or significant stenosis identified. No portal or mesenteric venous thrombosis identified. NON-VASCULAR 1. Improved appearance of the transverse colon on the study. Likely persistent colitis involving the sigmoid colon. Electronically Signed   By: Michaeline [name] M.D.   On: 11/29/2023 13:06   CT Angio Chest PE W and/or Wo Contrast Result  Date: 11/26/2023 CLINICAL DATA:  PE suspected, sepsis EXAM: CT ANGIOGRAPHY CHEST CT ABDOMEN AND PELVIS WITH CONTRAST TECHNIQUE: Multidetector CT imaging of the chest was performed using the standard protocol during bolus administration of intravenous contrast. Multiplanar CT image reconstructions and MIPs were obtained to evaluate the vascular anatomy. Multidetector CT imaging of the abdomen and pelvis was performed using the standard protocol during bolus administration of intravenous contrast. RADIATION DOSE REDUCTION: This exam was performed according to the departmental dose-optimization program which includes automated exposure control, adjustment of the mA and/or kV according to patient size and/or use of iterative reconstruction technique. CONTRAST:  65mL OMNIPAQUE  IOHEXOL  350 MG/ML SOLN COMPARISON:  02/12/2023 FINDINGS: CT CHEST ANGIOGRAM FINDINGS Cardiovascular: Satisfactory opacification of the pulmonary arteries to the segmental level. No evidence of pulmonary embolism. Normal heart size. No pericardial effusion. Aortic atherosclerosis. Mediastinum/Nodes: No enlarged mediastinal, hilar, or axillary lymph nodes. Small hiatal hernia. Thyroid  gland, trachea, and esophagus demonstrate no significant findings. Lungs/Pleura: Benjamine appearing bandlike scarring of the right middle lobe and left lower lobe (series 4, image 71). No pleural effusion or pneumothorax. Musculoskeletal: No chest wall abnormality. No acute osseous findings. Review of the MIP images confirms the above findings. CT ABDOMEN PELVIS FINDINGS Hepatobiliary: No solid liver abnormality is seen. No gallstones, gallbladder wall thickening, or biliary dilatation. Pancreas: Unremarkable. No pancreatic ductal dilatation or surrounding inflammatory changes. Spleen: Normal in size without significant abnormality. Adrenals/Urinary Tract: Adrenal glands are unremarkable. Kidneys are normal, without renal calculi, solid lesion, or hydronephrosis. Bladder  is unremarkable. Stomach/Bowel: Stomach is within normal limits. Appendix appears normal. The colon is decompressed. There is some suggestion of long segment wall thickening and mucosal hyperenhancement, most conspicuously in the transverse colon (series 9, image 57), and in the sigmoid colon (series 6, image 47). Occasional sigmoid diverticula. Vascular/Lymphatic: Aortic atherosclerosis. No enlarged abdominal or pelvic lymph nodes. Reproductive: No mass or other significant abnormality. Other: No abdominal wall hernia or abnormality. No ascites. Musculoskeletal: No acute or significant osseous findings. Review of the MIP images confirms the above findings. IMPRESSION: 1. Negative examination for pulmonary embolism. 2. The colon is decompressed. There is some suggestion of long segment wall thickening and mucosal hyperenhancement, most conspicuously in the transverse colon, and in the sigmoid  colon. Correlate for signs and symptoms of colitis. 3. Small hiatal hernia. Aortic Atherosclerosis (ICD10-I70.0). Electronically Signed   By: Marolyn JONETTA Jaksch M.D.   On: 11/26/2023 20:48   CT ABDOMEN PELVIS W CONTRAST Result Date: 11/26/2023 CLINICAL DATA:  PE suspected, sepsis EXAM: CT ANGIOGRAPHY CHEST CT ABDOMEN AND PELVIS WITH CONTRAST TECHNIQUE: Multidetector CT imaging of the chest was performed using the standard protocol during bolus administration of intravenous contrast. Multiplanar CT image reconstructions and MIPs were obtained to evaluate the vascular anatomy. Multidetector CT imaging of the abdomen and pelvis was performed using the standard protocol during bolus administration of intravenous contrast. RADIATION DOSE REDUCTION: This exam was performed according to the departmental dose-optimization program which includes automated exposure control, adjustment of the mA and/or kV according to patient size and/or use of iterative reconstruction technique. CONTRAST:  65mL OMNIPAQUE  IOHEXOL  350 MG/ML SOLN COMPARISON:   02/12/2023 FINDINGS: CT CHEST ANGIOGRAM FINDINGS Cardiovascular: Satisfactory opacification of the pulmonary arteries to the segmental level. No evidence of pulmonary embolism. Normal heart size. No pericardial effusion. Aortic atherosclerosis. Mediastinum/Nodes: No enlarged mediastinal, hilar, or axillary lymph nodes. Small hiatal hernia. Thyroid  gland, trachea, and esophagus demonstrate no significant findings. Lungs/Pleura: Benjamine appearing bandlike scarring of the right middle lobe and left lower lobe (series 4, image 71). No pleural effusion or pneumothorax. Musculoskeletal: No chest wall abnormality. No acute osseous findings. Review of the MIP images confirms the above findings. CT ABDOMEN PELVIS FINDINGS Hepatobiliary: No solid liver abnormality is seen. No gallstones, gallbladder wall thickening, or biliary dilatation. Pancreas: Unremarkable. No pancreatic ductal dilatation or surrounding inflammatory changes. Spleen: Normal in size without significant abnormality. Adrenals/Urinary Tract: Adrenal glands are unremarkable. Kidneys are normal, without renal calculi, solid lesion, or hydronephrosis. Bladder is unremarkable. Stomach/Bowel: Stomach is within normal limits. Appendix appears normal. The colon is decompressed. There is some suggestion of long segment wall thickening and mucosal hyperenhancement, most conspicuously in the transverse colon (series 9, image 57), and in the sigmoid colon (series 6, image 47). Occasional sigmoid diverticula. Vascular/Lymphatic: Aortic atherosclerosis. No enlarged abdominal or pelvic lymph nodes. Reproductive: No mass or other significant abnormality. Other: No abdominal wall hernia or abnormality. No ascites. Musculoskeletal: No acute or significant osseous findings. Review of the MIP images confirms the above findings. IMPRESSION: 1. Negative examination for pulmonary embolism. 2. The colon is decompressed. There is some suggestion of long segment wall thickening and  mucosal hyperenhancement, most conspicuously in the transverse colon, and in the sigmoid colon. Correlate for signs and symptoms of colitis. 3. Small hiatal hernia. Aortic Atherosclerosis (ICD10-I70.0). Electronically Signed   By: Marolyn JONETTA Jaksch M.D.   On: 11/26/2023 20:48   DG Chest Port 1 View Result Date: 11/26/2023 CLINICAL DATA:  Chest pain EXAM: PORTABLE CHEST 1 VIEW COMPARISON:  06/12/2021 FINDINGS: Stable pulmonary hyperinflation in keeping with changes of underlying COPD. New nodular opacity noted at the left peripheral lung base, indeterminate. Lungs are otherwise clear. No pneumothorax or pleural effusion. Cardiac size within normal limits. No acute bone abnormality. IMPRESSION: 1. COPD. 2. New nodular opacity at the left peripheral lung base, indeterminate. Standard two view chest radiograph would be helpful to exclude a imaging artifact after the patient's acute issues have resolved. Electronically Signed   By: Dorethia Molt M.D.   On: 11/26/2023 19:55    Microbiology: Recent Results (from the past 240 hours)  Resp panel by RT-PCR (RSV, Flu A&B, Covid) Anterior Nasal Swab     Status: None   Collection Time: 11/26/23  7:37 PM  Specimen: Anterior Nasal Swab  Result Value Ref Range Status   SARS Coronavirus 2 by RT PCR NEGATIVE NEGATIVE Final    Comment: (NOTE) SARS-CoV-2 target nucleic acids are NOT DETECTED.  The SARS-CoV-2 RNA is generally detectable in upper respiratory specimens during the acute phase of infection. The lowest concentration of SARS-CoV-2 viral copies this assay can detect is 138 copies/mL. A negative result does not preclude SARS-Cov-2 infection and should not be used as the sole basis for treatment or other patient management decisions. A negative result may occur with  improper specimen collection/handling, submission of specimen other than nasopharyngeal swab, presence of viral mutation(s) within the areas targeted by this assay, and inadequate number of  viral copies(<138 copies/mL). A negative result must be combined with clinical observations, patient history, and epidemiological information. The expected result is Negative.  Fact Sheet for Patients:  BloggerCourse.com  Fact Sheet for Healthcare Providers:  SeriousBroker.it  This test is no t yet approved or cleared by the [place]  FDA and  has been authorized for detection and/or diagnosis of SARS-CoV-2 by FDA under an Emergency Use Authorization (EUA). This EUA will remain  in effect (meaning this test can be used) for the duration of the COVID-19 declaration under Section 564(b)(1) of the Act, 21 U.S.C.section 360bbb-3(b)(1), unless the authorization is terminated  or revoked sooner.       Influenza A by PCR NEGATIVE NEGATIVE Final   Influenza B by PCR NEGATIVE NEGATIVE Final    Comment: (NOTE) The Xpert Xpress SARS-CoV-2/FLU/RSV plus assay is intended as an aid in the diagnosis of influenza from Nasopharyngeal swab specimens and should not be used as a sole basis for treatment. Nasal washings and aspirates are unacceptable for Xpert Xpress SARS-CoV-2/FLU/RSV testing.  Fact Sheet for Patients: BloggerCourse.com  Fact Sheet for Healthcare Providers: SeriousBroker.it  This test is not yet approved or cleared by the [place]  FDA and has been authorized for detection and/or diagnosis of SARS-CoV-2 by FDA under an Emergency Use Authorization (EUA). This EUA will remain in effect (meaning this test can be used) for the duration of the COVID-19 declaration under Section 564(b)(1) of the Act, 21 U.S.C. section 360bbb-3(b)(1), unless the authorization is terminated or revoked.     Resp Syncytial Virus by PCR NEGATIVE NEGATIVE Final    Comment: (NOTE) Fact Sheet for Patients: BloggerCourse.com  Fact Sheet for Healthcare  Providers: SeriousBroker.it  This test is not yet approved or cleared by the [place]  FDA and has been authorized for detection and/or diagnosis of SARS-CoV-2 by FDA under an Emergency Use Authorization (EUA). This EUA will remain in effect (meaning this test can be used) for the duration of the COVID-19 declaration under Section 564(b)(1) of the Act, 21 U.S.C. section 360bbb-3(b)(1), unless the authorization is terminated or revoked.  Performed at [facility], [address], [place], [place] [address]   Blood Culture (routine x 2)     Status: None   Collection Time: 11/26/23  7:48 PM   Specimen: BLOOD  Result Value Ref Range Status   Specimen Description   Final    BLOOD RIGHT ANTECUBITAL Performed at [facility], [address], [name], [place] [address]    Special Requests   Final    BOTTLES DRAWN AEROBIC AND ANAEROBIC Blood Culture results may not be optimal due to an inadequate volume of blood received in culture bottles   Culture   Final    NO GROWTH 5 DAYS Performed at [facility],  [address] [place], [place] [address]    Report Status 12/01/2023 FINAL  Final  Blood Culture (routine x 2)     Status: None   Collection Time: 11/26/23  8:09 PM   Specimen: BLOOD  Result Value Ref Range Status   Specimen Description   Final    BLOOD LEFT ANTECUBITAL Performed at [facility], [address], [place], [place] [address]    Special Requests   Final    BOTTLES DRAWN AEROBIC AND ANAEROBIC Blood Culture results may not be optimal due to an inadequate volume of blood received in culture bottles Performed at [facility], [address] [place], [place] [address]    Culture   Final    NO GROWTH 5 DAYS Performed at [facility], [address], [place], [place] [address]    Report Status 12/01/2023 FINAL  Final  Respiratory (~20 pathogens) panel  by PCR     Status: None   Collection Time: 11/27/23 10:40 PM   Specimen: Nasopharyngeal Swab; Respiratory  Result Value Ref Range Status   Adenovirus NOT DETECTED NOT DETECTED Final   Coronavirus 229E NOT DETECTED NOT DETECTED Final    Comment: (NOTE) The Coronavirus on the Respiratory Panel, DOES NOT test for the novel  Coronavirus (2019 nCoV)    Coronavirus HKU1 NOT DETECTED NOT DETECTED Final   Coronavirus NL63 NOT DETECTED NOT DETECTED Final   Coronavirus OC43 NOT DETECTED NOT DETECTED Final   Metapneumovirus NOT DETECTED NOT DETECTED Final   Rhinovirus / Enterovirus NOT DETECTED NOT DETECTED Final   Influenza A NOT DETECTED NOT DETECTED Final   Influenza B NOT DETECTED NOT DETECTED Final   Parainfluenza Virus 1 NOT DETECTED NOT DETECTED Final   Parainfluenza Virus 2 NOT DETECTED NOT DETECTED Final   Parainfluenza Virus 3 NOT DETECTED NOT DETECTED Final   Parainfluenza Virus 4 NOT DETECTED NOT DETECTED Final   Respiratory Syncytial Virus NOT DETECTED NOT DETECTED Final   Bordetella pertussis NOT DETECTED NOT DETECTED Final   Bordetella Parapertussis NOT DETECTED NOT DETECTED Final   Chlamydophila pneumoniae NOT DETECTED NOT DETECTED Final   Mycoplasma pneumoniae NOT DETECTED NOT DETECTED Final    Comment: Performed at [facility], [address] [place], [place] [address]  Gastrointestinal Panel by PCR , Stool     Status: None   Collection Time: 11/29/23  1:18 PM   Specimen: Stool  Result Value Ref Range Status   Campylobacter species NOT DETECTED NOT DETECTED Final   Plesimonas shigelloides NOT DETECTED NOT DETECTED Final   Salmonella species NOT DETECTED NOT DETECTED Final   Yersinia enterocolitica NOT DETECTED NOT DETECTED Final   Vibrio species NOT DETECTED NOT DETECTED Final   Vibrio cholerae NOT DETECTED NOT DETECTED Final   Enteroaggregative E coli (EAEC) NOT DETECTED NOT DETECTED Final   Enteropathogenic E coli (EPEC) NOT DETECTED NOT DETECTED Final    Enterotoxigenic E coli (ETEC) NOT DETECTED NOT DETECTED Final   Shiga like toxin producing E coli (STEC) NOT DETECTED NOT DETECTED Final   Shigella/Enteroinvasive E coli (EIEC) NOT DETECTED NOT DETECTED Final   Cryptosporidium NOT DETECTED NOT DETECTED Final   Cyclospora cayetanensis NOT DETECTED NOT DETECTED Final   Entamoeba histolytica NOT DETECTED NOT DETECTED Final   Giardia lamblia NOT DETECTED NOT DETECTED Final   Adenovirus F40/41 NOT DETECTED NOT DETECTED Final   Astrovirus NOT DETECTED NOT DETECTED Final   Norovirus GI/GII NOT DETECTED NOT DETECTED Final   Rotavirus A  NOT DETECTED NOT DETECTED Final   Sapovirus (I, II, IV, and V) NOT DETECTED NOT DETECTED Final    Comment: Performed at [facility], [address] [place], [place] [address]     Labs: Basic Metabolic Panel: Recent Labs  Lab 11/29/23 0453 11/30/23 0535 12/01/23 0513 12/02/23 0442 12/03/23 0544 12/04/23 0516  NA 136 133* 137 140 138 136  K 3.4* 3.7 3.6 3.1* 3.9 4.0  CL 103 98 100 103 99 101  CO2 25 25 25 26 28 24   GLUCOSE 119* 106* 199* 91 59* 110*  BUN 19 17 15 17 20 22   CREATININE 0.82 0.69 0.74 0.81 0.81 0.77  CALCIUM  9.0 8.9 8.8* 8.7* 9.4 9.2  MG 1.9 2.0 2.0  --   --   --   PHOS  --  2.4*  --   --   --   --    Liver Function Tests: Recent Labs  Lab 11/28/23 0455  AST 15  ALT 12  ALKPHOS 27*  BILITOT 0.5  PROT 5.5*  ALBUMIN 2.7*   No results for input(s): LIPASE, AMYLASE in the last 168 hours. No results for input(s): AMMONIA in the last 168 hours. CBC: Recent Labs  Lab 11/30/23 0535 12/01/23 0513 12/02/23 0442 12/03/23 0544 12/04/23 0516  WBC 15.4* 17.0* 20.8* 23.3* 21.8*  NEUTROABS 11.1* 13.7* 15.5* 15.9* 15.4*  HGB 11.1* 11.5* 10.8* 12.0 11.8*  HCT 34.3* 35.2* 33.2* 37.9 36.2  MCV 91.0 92.4 93.5 94.3 93.3  PLT 296 326 307 358 322   Cardiac Enzymes: No results for input(s): CKTOTAL, CKMB, CKMBINDEX, TROPONINI in the last 168 hours. BNP: BNP (last  3 results) No results for input(s): BNP in the last 8760 hours.  ProBNP (last 3 results) No results for input(s): PROBNP in the last 8760 hours.  CBG: Recent Labs  Lab 12/03/23 2033 12/03/23 2119 12/04/23 0146 12/04/23 0523 12/04/23 0726  GLUCAP 52* 105* 77 124* 99       Signed:  Toribio Hummer MD.  [facility] 12/04/2023, 9:51 AM

## 2023-12-06 DIAGNOSIS — E7800 Pure hypercholesterolemia, unspecified: Secondary | ICD-10-CM | POA: Diagnosis not present

## 2023-12-06 DIAGNOSIS — I700 Atherosclerosis of aorta: Secondary | ICD-10-CM | POA: Diagnosis not present

## 2023-12-06 DIAGNOSIS — I129 Hypertensive chronic kidney disease with stage 1 through stage 4 chronic kidney disease, or unspecified chronic kidney disease: Secondary | ICD-10-CM | POA: Diagnosis not present

## 2023-12-06 DIAGNOSIS — J45901 Unspecified asthma with (acute) exacerbation: Secondary | ICD-10-CM | POA: Diagnosis not present

## 2023-12-06 DIAGNOSIS — Z7984 Long term (current) use of oral hypoglycemic drugs: Secondary | ICD-10-CM | POA: Diagnosis not present

## 2023-12-06 DIAGNOSIS — E039 Hypothyroidism, unspecified: Secondary | ICD-10-CM | POA: Diagnosis not present

## 2023-12-06 DIAGNOSIS — D631 Anemia in chronic kidney disease: Secondary | ICD-10-CM | POA: Diagnosis not present

## 2023-12-06 DIAGNOSIS — Z9181 History of falling: Secondary | ICD-10-CM | POA: Diagnosis not present

## 2023-12-06 DIAGNOSIS — Z85828 Personal history of other malignant neoplasm of skin: Secondary | ICD-10-CM | POA: Diagnosis not present

## 2023-12-06 DIAGNOSIS — Z7952 Long term (current) use of systemic steroids: Secondary | ICD-10-CM | POA: Diagnosis not present

## 2023-12-06 DIAGNOSIS — E1122 Type 2 diabetes mellitus with diabetic chronic kidney disease: Secondary | ICD-10-CM | POA: Diagnosis not present

## 2023-12-06 DIAGNOSIS — J441 Chronic obstructive pulmonary disease with (acute) exacerbation: Secondary | ICD-10-CM | POA: Diagnosis not present

## 2023-12-06 DIAGNOSIS — Z794 Long term (current) use of insulin: Secondary | ICD-10-CM | POA: Diagnosis not present

## 2023-12-06 DIAGNOSIS — K529 Noninfective gastroenteritis and colitis, unspecified: Secondary | ICD-10-CM | POA: Diagnosis not present

## 2023-12-06 DIAGNOSIS — Z85818 Personal history of malignant neoplasm of other sites of lip, oral cavity, and pharynx: Secondary | ICD-10-CM | POA: Diagnosis not present

## 2023-12-06 DIAGNOSIS — D509 Iron deficiency anemia, unspecified: Secondary | ICD-10-CM | POA: Diagnosis not present

## 2023-12-06 DIAGNOSIS — K449 Diaphragmatic hernia without obstruction or gangrene: Secondary | ICD-10-CM | POA: Diagnosis not present

## 2023-12-06 DIAGNOSIS — N1831 Chronic kidney disease, stage 3a: Secondary | ICD-10-CM | POA: Diagnosis not present

## 2023-12-06 DIAGNOSIS — M5416 Radiculopathy, lumbar region: Secondary | ICD-10-CM | POA: Diagnosis not present

## 2023-12-06 DIAGNOSIS — Z86018 Personal history of other benign neoplasm: Secondary | ICD-10-CM | POA: Diagnosis not present

## 2023-12-06 DIAGNOSIS — Z7951 Long term (current) use of inhaled steroids: Secondary | ICD-10-CM | POA: Diagnosis not present

## 2023-12-07 DIAGNOSIS — E1165 Type 2 diabetes mellitus with hyperglycemia: Secondary | ICD-10-CM | POA: Diagnosis not present

## 2023-12-07 DIAGNOSIS — K529 Noninfective gastroenteritis and colitis, unspecified: Secondary | ICD-10-CM | POA: Diagnosis not present

## 2023-12-07 DIAGNOSIS — J449 Chronic obstructive pulmonary disease, unspecified: Secondary | ICD-10-CM | POA: Diagnosis not present

## 2023-12-07 DIAGNOSIS — I10 Essential (primary) hypertension: Secondary | ICD-10-CM | POA: Diagnosis not present

## 2023-12-08 DIAGNOSIS — I129 Hypertensive chronic kidney disease with stage 1 through stage 4 chronic kidney disease, or unspecified chronic kidney disease: Secondary | ICD-10-CM | POA: Diagnosis not present

## 2023-12-08 DIAGNOSIS — J441 Chronic obstructive pulmonary disease with (acute) exacerbation: Secondary | ICD-10-CM | POA: Diagnosis not present

## 2023-12-08 DIAGNOSIS — E1122 Type 2 diabetes mellitus with diabetic chronic kidney disease: Secondary | ICD-10-CM | POA: Diagnosis not present

## 2023-12-08 DIAGNOSIS — Z9181 History of falling: Secondary | ICD-10-CM | POA: Diagnosis not present

## 2023-12-08 DIAGNOSIS — Z7951 Long term (current) use of inhaled steroids: Secondary | ICD-10-CM | POA: Diagnosis not present

## 2023-12-08 DIAGNOSIS — J45901 Unspecified asthma with (acute) exacerbation: Secondary | ICD-10-CM | POA: Diagnosis not present

## 2023-12-08 DIAGNOSIS — Z86018 Personal history of other benign neoplasm: Secondary | ICD-10-CM | POA: Diagnosis not present

## 2023-12-08 DIAGNOSIS — E039 Hypothyroidism, unspecified: Secondary | ICD-10-CM | POA: Diagnosis not present

## 2023-12-08 DIAGNOSIS — Z7984 Long term (current) use of oral hypoglycemic drugs: Secondary | ICD-10-CM | POA: Diagnosis not present

## 2023-12-08 DIAGNOSIS — E7800 Pure hypercholesterolemia, unspecified: Secondary | ICD-10-CM | POA: Diagnosis not present

## 2023-12-08 DIAGNOSIS — D509 Iron deficiency anemia, unspecified: Secondary | ICD-10-CM | POA: Diagnosis not present

## 2023-12-08 DIAGNOSIS — N1831 Chronic kidney disease, stage 3a: Secondary | ICD-10-CM | POA: Diagnosis not present

## 2023-12-08 DIAGNOSIS — K529 Noninfective gastroenteritis and colitis, unspecified: Secondary | ICD-10-CM | POA: Diagnosis not present

## 2023-12-08 DIAGNOSIS — Z7952 Long term (current) use of systemic steroids: Secondary | ICD-10-CM | POA: Diagnosis not present

## 2023-12-08 DIAGNOSIS — M5416 Radiculopathy, lumbar region: Secondary | ICD-10-CM | POA: Diagnosis not present

## 2023-12-08 DIAGNOSIS — Z85828 Personal history of other malignant neoplasm of skin: Secondary | ICD-10-CM | POA: Diagnosis not present

## 2023-12-08 DIAGNOSIS — Z85818 Personal history of malignant neoplasm of other sites of lip, oral cavity, and pharynx: Secondary | ICD-10-CM | POA: Diagnosis not present

## 2023-12-08 DIAGNOSIS — K449 Diaphragmatic hernia without obstruction or gangrene: Secondary | ICD-10-CM | POA: Diagnosis not present

## 2023-12-08 DIAGNOSIS — D631 Anemia in chronic kidney disease: Secondary | ICD-10-CM | POA: Diagnosis not present

## 2023-12-08 DIAGNOSIS — Z794 Long term (current) use of insulin: Secondary | ICD-10-CM | POA: Diagnosis not present

## 2023-12-08 DIAGNOSIS — I700 Atherosclerosis of aorta: Secondary | ICD-10-CM | POA: Diagnosis not present

## 2023-12-09 DIAGNOSIS — I129 Hypertensive chronic kidney disease with stage 1 through stage 4 chronic kidney disease, or unspecified chronic kidney disease: Secondary | ICD-10-CM | POA: Diagnosis not present

## 2023-12-09 DIAGNOSIS — Z794 Long term (current) use of insulin: Secondary | ICD-10-CM | POA: Diagnosis not present

## 2023-12-09 DIAGNOSIS — Z85828 Personal history of other malignant neoplasm of skin: Secondary | ICD-10-CM | POA: Diagnosis not present

## 2023-12-09 DIAGNOSIS — Z85818 Personal history of malignant neoplasm of other sites of lip, oral cavity, and pharynx: Secondary | ICD-10-CM | POA: Diagnosis not present

## 2023-12-09 DIAGNOSIS — N1831 Chronic kidney disease, stage 3a: Secondary | ICD-10-CM | POA: Diagnosis not present

## 2023-12-09 DIAGNOSIS — Z7952 Long term (current) use of systemic steroids: Secondary | ICD-10-CM | POA: Diagnosis not present

## 2023-12-09 DIAGNOSIS — J45901 Unspecified asthma with (acute) exacerbation: Secondary | ICD-10-CM | POA: Diagnosis not present

## 2023-12-09 DIAGNOSIS — E1122 Type 2 diabetes mellitus with diabetic chronic kidney disease: Secondary | ICD-10-CM | POA: Diagnosis not present

## 2023-12-09 DIAGNOSIS — E7800 Pure hypercholesterolemia, unspecified: Secondary | ICD-10-CM | POA: Diagnosis not present

## 2023-12-09 DIAGNOSIS — Z7951 Long term (current) use of inhaled steroids: Secondary | ICD-10-CM | POA: Diagnosis not present

## 2023-12-09 DIAGNOSIS — D631 Anemia in chronic kidney disease: Secondary | ICD-10-CM | POA: Diagnosis not present

## 2023-12-09 DIAGNOSIS — Z86018 Personal history of other benign neoplasm: Secondary | ICD-10-CM | POA: Diagnosis not present

## 2023-12-09 DIAGNOSIS — D509 Iron deficiency anemia, unspecified: Secondary | ICD-10-CM | POA: Diagnosis not present

## 2023-12-09 DIAGNOSIS — J441 Chronic obstructive pulmonary disease with (acute) exacerbation: Secondary | ICD-10-CM | POA: Diagnosis not present

## 2023-12-09 DIAGNOSIS — Z9181 History of falling: Secondary | ICD-10-CM | POA: Diagnosis not present

## 2023-12-09 DIAGNOSIS — K449 Diaphragmatic hernia without obstruction or gangrene: Secondary | ICD-10-CM | POA: Diagnosis not present

## 2023-12-09 DIAGNOSIS — Z7984 Long term (current) use of oral hypoglycemic drugs: Secondary | ICD-10-CM | POA: Diagnosis not present

## 2023-12-09 DIAGNOSIS — K529 Noninfective gastroenteritis and colitis, unspecified: Secondary | ICD-10-CM | POA: Diagnosis not present

## 2023-12-09 DIAGNOSIS — M5416 Radiculopathy, lumbar region: Secondary | ICD-10-CM | POA: Diagnosis not present

## 2023-12-09 DIAGNOSIS — E039 Hypothyroidism, unspecified: Secondary | ICD-10-CM | POA: Diagnosis not present

## 2023-12-09 DIAGNOSIS — I700 Atherosclerosis of aorta: Secondary | ICD-10-CM | POA: Diagnosis not present

## 2023-12-12 ENCOUNTER — Telehealth: Payer: Self-pay

## 2023-12-12 DIAGNOSIS — Z85818 Personal history of malignant neoplasm of other sites of lip, oral cavity, and pharynx: Secondary | ICD-10-CM | POA: Diagnosis not present

## 2023-12-12 DIAGNOSIS — E039 Hypothyroidism, unspecified: Secondary | ICD-10-CM | POA: Diagnosis not present

## 2023-12-12 DIAGNOSIS — Z7952 Long term (current) use of systemic steroids: Secondary | ICD-10-CM | POA: Diagnosis not present

## 2023-12-12 DIAGNOSIS — Z7984 Long term (current) use of oral hypoglycemic drugs: Secondary | ICD-10-CM | POA: Diagnosis not present

## 2023-12-12 DIAGNOSIS — N1831 Chronic kidney disease, stage 3a: Secondary | ICD-10-CM | POA: Diagnosis not present

## 2023-12-12 DIAGNOSIS — Z86018 Personal history of other benign neoplasm: Secondary | ICD-10-CM | POA: Diagnosis not present

## 2023-12-12 DIAGNOSIS — Z85828 Personal history of other malignant neoplasm of skin: Secondary | ICD-10-CM | POA: Diagnosis not present

## 2023-12-12 DIAGNOSIS — D509 Iron deficiency anemia, unspecified: Secondary | ICD-10-CM | POA: Diagnosis not present

## 2023-12-12 DIAGNOSIS — J441 Chronic obstructive pulmonary disease with (acute) exacerbation: Secondary | ICD-10-CM | POA: Diagnosis not present

## 2023-12-12 DIAGNOSIS — M5416 Radiculopathy, lumbar region: Secondary | ICD-10-CM | POA: Diagnosis not present

## 2023-12-12 DIAGNOSIS — D631 Anemia in chronic kidney disease: Secondary | ICD-10-CM | POA: Diagnosis not present

## 2023-12-12 DIAGNOSIS — K449 Diaphragmatic hernia without obstruction or gangrene: Secondary | ICD-10-CM | POA: Diagnosis not present

## 2023-12-12 DIAGNOSIS — Z794 Long term (current) use of insulin: Secondary | ICD-10-CM | POA: Diagnosis not present

## 2023-12-12 DIAGNOSIS — E7800 Pure hypercholesterolemia, unspecified: Secondary | ICD-10-CM | POA: Diagnosis not present

## 2023-12-12 DIAGNOSIS — Z7951 Long term (current) use of inhaled steroids: Secondary | ICD-10-CM | POA: Diagnosis not present

## 2023-12-12 DIAGNOSIS — E1122 Type 2 diabetes mellitus with diabetic chronic kidney disease: Secondary | ICD-10-CM | POA: Diagnosis not present

## 2023-12-12 DIAGNOSIS — I700 Atherosclerosis of aorta: Secondary | ICD-10-CM | POA: Diagnosis not present

## 2023-12-12 DIAGNOSIS — Z9181 History of falling: Secondary | ICD-10-CM | POA: Diagnosis not present

## 2023-12-12 DIAGNOSIS — I129 Hypertensive chronic kidney disease with stage 1 through stage 4 chronic kidney disease, or unspecified chronic kidney disease: Secondary | ICD-10-CM | POA: Diagnosis not present

## 2023-12-12 DIAGNOSIS — K529 Noninfective gastroenteritis and colitis, unspecified: Secondary | ICD-10-CM | POA: Diagnosis not present

## 2023-12-12 DIAGNOSIS — J45901 Unspecified asthma with (acute) exacerbation: Secondary | ICD-10-CM | POA: Diagnosis not present

## 2023-12-12 NOTE — Telephone Encounter (Signed)
 Copied from CRM [id number]. Topic: Clinical - Medical Advice >> Dec 09, 2023  2:58 PM Joesph PARAS wrote: Reason for CRM: Patient's daughter is calling to request a spirometer. States her primary care physician wants her to have one and that they were directed to contact us . Please advise patient's daughter.  Patient is scheduled to establish care in three weeks.  Tried calling the pt- no answer, left detailed vm per DPR stating pt has ov 8/13 with Dr. Donzetta and will have to be seen in order for spirometer to be ordered. Nfn

## 2023-12-13 ENCOUNTER — Telehealth: Payer: Self-pay | Admitting: Cardiology

## 2023-12-13 DIAGNOSIS — K5730 Diverticulosis of large intestine without perforation or abscess without bleeding: Secondary | ICD-10-CM | POA: Diagnosis not present

## 2023-12-13 DIAGNOSIS — R194 Change in bowel habit: Secondary | ICD-10-CM | POA: Diagnosis not present

## 2023-12-13 DIAGNOSIS — R140 Abdominal distension (gaseous): Secondary | ICD-10-CM | POA: Diagnosis not present

## 2023-12-13 DIAGNOSIS — K219 Gastro-esophageal reflux disease without esophagitis: Secondary | ICD-10-CM | POA: Diagnosis not present

## 2023-12-13 DIAGNOSIS — R634 Abnormal weight loss: Secondary | ICD-10-CM | POA: Diagnosis not present

## 2023-12-13 NOTE — Telephone Encounter (Signed)
 Spoke to patient's daughter Gordy.She stated mother has been having fast heart beat.Yesterday averaging 122 beats per min.She feels very fatigued.She was discharged from [facility] hospital 7/20.She is taking Metoprolol  25 mg twice a day.Appointment scheduled with Dr.Ganji 7/31 at 3:20 pm.

## 2023-12-13 NOTE — Telephone Encounter (Signed)
 STAT if HR is under 50 or over 120  (normal HR is 60-100 beats per minute)  What is your heart rate? 122 yesterday, 90 this morning   Do you have a log of your heart rate readings (document readings)? No  Do you have any other symptoms? Very Fatigue and was discharged from hospital on 7/20. Please advise

## 2023-12-14 NOTE — Telephone Encounter (Signed)
 Called patient's daughter Gordy left message on personal voice mail to call back.

## 2023-12-14 NOTE — Telephone Encounter (Signed)
 Not sure she needs an appointment to see me, she was just admitted to the hospital with colitis and acute exacerbation of bronchial asthma which can both lead to elevated heart rate.  She would be better served by seeing her PCP to see if anything needs to be adjusted.

## 2023-12-14 NOTE — Telephone Encounter (Signed)
 Received call back from patient's daughter Gordy.Dr.Ganji's advice given.Stated PCP advised her to see Dr.Ganji.She will keep appointment with him 7/31 at 3:20 pm.

## 2023-12-15 ENCOUNTER — Encounter (INDEPENDENT_AMBULATORY_CARE_PROVIDER_SITE_OTHER): Payer: Self-pay

## 2023-12-15 ENCOUNTER — Ambulatory Visit: Admitting: Cardiology

## 2023-12-15 DIAGNOSIS — Z9181 History of falling: Secondary | ICD-10-CM | POA: Diagnosis not present

## 2023-12-15 DIAGNOSIS — E1122 Type 2 diabetes mellitus with diabetic chronic kidney disease: Secondary | ICD-10-CM | POA: Diagnosis not present

## 2023-12-15 DIAGNOSIS — D631 Anemia in chronic kidney disease: Secondary | ICD-10-CM | POA: Diagnosis not present

## 2023-12-15 DIAGNOSIS — Z794 Long term (current) use of insulin: Secondary | ICD-10-CM | POA: Diagnosis not present

## 2023-12-15 DIAGNOSIS — J45901 Unspecified asthma with (acute) exacerbation: Secondary | ICD-10-CM | POA: Diagnosis not present

## 2023-12-15 DIAGNOSIS — E7800 Pure hypercholesterolemia, unspecified: Secondary | ICD-10-CM | POA: Diagnosis not present

## 2023-12-15 DIAGNOSIS — E039 Hypothyroidism, unspecified: Secondary | ICD-10-CM | POA: Diagnosis not present

## 2023-12-15 DIAGNOSIS — J441 Chronic obstructive pulmonary disease with (acute) exacerbation: Secondary | ICD-10-CM | POA: Diagnosis not present

## 2023-12-15 DIAGNOSIS — Z85828 Personal history of other malignant neoplasm of skin: Secondary | ICD-10-CM | POA: Diagnosis not present

## 2023-12-15 DIAGNOSIS — N1831 Chronic kidney disease, stage 3a: Secondary | ICD-10-CM | POA: Diagnosis not present

## 2023-12-15 DIAGNOSIS — M5416 Radiculopathy, lumbar region: Secondary | ICD-10-CM | POA: Diagnosis not present

## 2023-12-15 DIAGNOSIS — D509 Iron deficiency anemia, unspecified: Secondary | ICD-10-CM | POA: Diagnosis not present

## 2023-12-15 DIAGNOSIS — Z7951 Long term (current) use of inhaled steroids: Secondary | ICD-10-CM | POA: Diagnosis not present

## 2023-12-15 DIAGNOSIS — Z7952 Long term (current) use of systemic steroids: Secondary | ICD-10-CM | POA: Diagnosis not present

## 2023-12-15 DIAGNOSIS — Z85818 Personal history of malignant neoplasm of other sites of lip, oral cavity, and pharynx: Secondary | ICD-10-CM | POA: Diagnosis not present

## 2023-12-15 DIAGNOSIS — K449 Diaphragmatic hernia without obstruction or gangrene: Secondary | ICD-10-CM | POA: Diagnosis not present

## 2023-12-15 DIAGNOSIS — Z86018 Personal history of other benign neoplasm: Secondary | ICD-10-CM | POA: Diagnosis not present

## 2023-12-15 DIAGNOSIS — I700 Atherosclerosis of aorta: Secondary | ICD-10-CM | POA: Diagnosis not present

## 2023-12-15 DIAGNOSIS — I129 Hypertensive chronic kidney disease with stage 1 through stage 4 chronic kidney disease, or unspecified chronic kidney disease: Secondary | ICD-10-CM | POA: Diagnosis not present

## 2023-12-15 DIAGNOSIS — Z7984 Long term (current) use of oral hypoglycemic drugs: Secondary | ICD-10-CM | POA: Diagnosis not present

## 2023-12-15 DIAGNOSIS — K529 Noninfective gastroenteritis and colitis, unspecified: Secondary | ICD-10-CM | POA: Diagnosis not present

## 2023-12-16 DIAGNOSIS — R350 Frequency of micturition: Secondary | ICD-10-CM | POA: Diagnosis not present

## 2023-12-16 DIAGNOSIS — D72829 Elevated white blood cell count, unspecified: Secondary | ICD-10-CM | POA: Diagnosis not present

## 2023-12-19 DIAGNOSIS — J45901 Unspecified asthma with (acute) exacerbation: Secondary | ICD-10-CM | POA: Diagnosis not present

## 2023-12-19 DIAGNOSIS — E119 Type 2 diabetes mellitus without complications: Secondary | ICD-10-CM | POA: Diagnosis not present

## 2023-12-19 DIAGNOSIS — H40023 Open angle with borderline findings, high risk, bilateral: Secondary | ICD-10-CM | POA: Diagnosis not present

## 2023-12-19 DIAGNOSIS — I700 Atherosclerosis of aorta: Secondary | ICD-10-CM | POA: Diagnosis not present

## 2023-12-19 DIAGNOSIS — E1122 Type 2 diabetes mellitus with diabetic chronic kidney disease: Secondary | ICD-10-CM | POA: Diagnosis not present

## 2023-12-19 DIAGNOSIS — E039 Hypothyroidism, unspecified: Secondary | ICD-10-CM | POA: Diagnosis not present

## 2023-12-19 DIAGNOSIS — E7800 Pure hypercholesterolemia, unspecified: Secondary | ICD-10-CM | POA: Diagnosis not present

## 2023-12-19 DIAGNOSIS — D509 Iron deficiency anemia, unspecified: Secondary | ICD-10-CM | POA: Diagnosis not present

## 2023-12-19 DIAGNOSIS — J441 Chronic obstructive pulmonary disease with (acute) exacerbation: Secondary | ICD-10-CM | POA: Diagnosis not present

## 2023-12-19 DIAGNOSIS — Z86018 Personal history of other benign neoplasm: Secondary | ICD-10-CM | POA: Diagnosis not present

## 2023-12-19 DIAGNOSIS — I129 Hypertensive chronic kidney disease with stage 1 through stage 4 chronic kidney disease, or unspecified chronic kidney disease: Secondary | ICD-10-CM | POA: Diagnosis not present

## 2023-12-19 DIAGNOSIS — M5416 Radiculopathy, lumbar region: Secondary | ICD-10-CM | POA: Diagnosis not present

## 2023-12-19 DIAGNOSIS — K449 Diaphragmatic hernia without obstruction or gangrene: Secondary | ICD-10-CM | POA: Diagnosis not present

## 2023-12-19 DIAGNOSIS — Z7952 Long term (current) use of systemic steroids: Secondary | ICD-10-CM | POA: Diagnosis not present

## 2023-12-19 DIAGNOSIS — Z9181 History of falling: Secondary | ICD-10-CM | POA: Diagnosis not present

## 2023-12-19 DIAGNOSIS — Z7951 Long term (current) use of inhaled steroids: Secondary | ICD-10-CM | POA: Diagnosis not present

## 2023-12-19 DIAGNOSIS — Z794 Long term (current) use of insulin: Secondary | ICD-10-CM | POA: Diagnosis not present

## 2023-12-19 DIAGNOSIS — N1831 Chronic kidney disease, stage 3a: Secondary | ICD-10-CM | POA: Diagnosis not present

## 2023-12-19 DIAGNOSIS — Z7984 Long term (current) use of oral hypoglycemic drugs: Secondary | ICD-10-CM | POA: Diagnosis not present

## 2023-12-19 DIAGNOSIS — Z85818 Personal history of malignant neoplasm of other sites of lip, oral cavity, and pharynx: Secondary | ICD-10-CM | POA: Diagnosis not present

## 2023-12-19 DIAGNOSIS — Z961 Presence of intraocular lens: Secondary | ICD-10-CM | POA: Diagnosis not present

## 2023-12-19 DIAGNOSIS — Z85828 Personal history of other malignant neoplasm of skin: Secondary | ICD-10-CM | POA: Diagnosis not present

## 2023-12-19 DIAGNOSIS — D631 Anemia in chronic kidney disease: Secondary | ICD-10-CM | POA: Diagnosis not present

## 2023-12-21 DIAGNOSIS — Z7952 Long term (current) use of systemic steroids: Secondary | ICD-10-CM | POA: Diagnosis not present

## 2023-12-22 DIAGNOSIS — R54 Age-related physical debility: Secondary | ICD-10-CM | POA: Diagnosis not present

## 2023-12-27 DIAGNOSIS — I700 Atherosclerosis of aorta: Secondary | ICD-10-CM | POA: Diagnosis not present

## 2023-12-27 DIAGNOSIS — N1831 Chronic kidney disease, stage 3a: Secondary | ICD-10-CM | POA: Diagnosis not present

## 2023-12-27 DIAGNOSIS — Z794 Long term (current) use of insulin: Secondary | ICD-10-CM | POA: Diagnosis not present

## 2023-12-27 DIAGNOSIS — E039 Hypothyroidism, unspecified: Secondary | ICD-10-CM | POA: Diagnosis not present

## 2023-12-27 DIAGNOSIS — Z7951 Long term (current) use of inhaled steroids: Secondary | ICD-10-CM | POA: Diagnosis not present

## 2023-12-27 DIAGNOSIS — I129 Hypertensive chronic kidney disease with stage 1 through stage 4 chronic kidney disease, or unspecified chronic kidney disease: Secondary | ICD-10-CM | POA: Diagnosis not present

## 2023-12-27 DIAGNOSIS — K529 Noninfective gastroenteritis and colitis, unspecified: Secondary | ICD-10-CM | POA: Diagnosis not present

## 2023-12-27 DIAGNOSIS — D631 Anemia in chronic kidney disease: Secondary | ICD-10-CM | POA: Diagnosis not present

## 2023-12-27 DIAGNOSIS — Z9181 History of falling: Secondary | ICD-10-CM | POA: Diagnosis not present

## 2023-12-27 DIAGNOSIS — D509 Iron deficiency anemia, unspecified: Secondary | ICD-10-CM | POA: Diagnosis not present

## 2023-12-27 DIAGNOSIS — K449 Diaphragmatic hernia without obstruction or gangrene: Secondary | ICD-10-CM | POA: Diagnosis not present

## 2023-12-27 DIAGNOSIS — Z7984 Long term (current) use of oral hypoglycemic drugs: Secondary | ICD-10-CM | POA: Diagnosis not present

## 2023-12-27 DIAGNOSIS — J45901 Unspecified asthma with (acute) exacerbation: Secondary | ICD-10-CM | POA: Diagnosis not present

## 2023-12-27 DIAGNOSIS — Z86018 Personal history of other benign neoplasm: Secondary | ICD-10-CM | POA: Diagnosis not present

## 2023-12-27 DIAGNOSIS — E7800 Pure hypercholesterolemia, unspecified: Secondary | ICD-10-CM | POA: Diagnosis not present

## 2023-12-27 DIAGNOSIS — M5416 Radiculopathy, lumbar region: Secondary | ICD-10-CM | POA: Diagnosis not present

## 2023-12-27 DIAGNOSIS — Z7952 Long term (current) use of systemic steroids: Secondary | ICD-10-CM | POA: Diagnosis not present

## 2023-12-27 DIAGNOSIS — E1122 Type 2 diabetes mellitus with diabetic chronic kidney disease: Secondary | ICD-10-CM | POA: Diagnosis not present

## 2023-12-27 DIAGNOSIS — Z85818 Personal history of malignant neoplasm of other sites of lip, oral cavity, and pharynx: Secondary | ICD-10-CM | POA: Diagnosis not present

## 2023-12-27 DIAGNOSIS — J441 Chronic obstructive pulmonary disease with (acute) exacerbation: Secondary | ICD-10-CM | POA: Diagnosis not present

## 2023-12-28 ENCOUNTER — Ambulatory Visit (INDEPENDENT_AMBULATORY_CARE_PROVIDER_SITE_OTHER)

## 2023-12-28 VITALS — HR 81 | Ht 61.5 in | Wt 97.4 lb

## 2023-12-28 DIAGNOSIS — N1831 Chronic kidney disease, stage 3a: Secondary | ICD-10-CM | POA: Diagnosis not present

## 2023-12-28 DIAGNOSIS — J984 Other disorders of lung: Secondary | ICD-10-CM | POA: Diagnosis not present

## 2023-12-28 DIAGNOSIS — J4520 Mild intermittent asthma, uncomplicated: Secondary | ICD-10-CM

## 2023-12-28 DIAGNOSIS — I700 Atherosclerosis of aorta: Secondary | ICD-10-CM | POA: Diagnosis not present

## 2023-12-28 DIAGNOSIS — Z9181 History of falling: Secondary | ICD-10-CM | POA: Diagnosis not present

## 2023-12-28 DIAGNOSIS — E7800 Pure hypercholesterolemia, unspecified: Secondary | ICD-10-CM | POA: Diagnosis not present

## 2023-12-28 DIAGNOSIS — E039 Hypothyroidism, unspecified: Secondary | ICD-10-CM | POA: Diagnosis not present

## 2023-12-28 DIAGNOSIS — E1122 Type 2 diabetes mellitus with diabetic chronic kidney disease: Secondary | ICD-10-CM | POA: Diagnosis not present

## 2023-12-28 DIAGNOSIS — Z85818 Personal history of malignant neoplasm of other sites of lip, oral cavity, and pharynx: Secondary | ICD-10-CM | POA: Diagnosis not present

## 2023-12-28 DIAGNOSIS — Z86018 Personal history of other benign neoplasm: Secondary | ICD-10-CM | POA: Diagnosis not present

## 2023-12-28 DIAGNOSIS — Z794 Long term (current) use of insulin: Secondary | ICD-10-CM | POA: Diagnosis not present

## 2023-12-28 DIAGNOSIS — K449 Diaphragmatic hernia without obstruction or gangrene: Secondary | ICD-10-CM | POA: Diagnosis not present

## 2023-12-28 DIAGNOSIS — M5416 Radiculopathy, lumbar region: Secondary | ICD-10-CM | POA: Diagnosis not present

## 2023-12-28 DIAGNOSIS — D631 Anemia in chronic kidney disease: Secondary | ICD-10-CM | POA: Diagnosis not present

## 2023-12-28 DIAGNOSIS — Z7984 Long term (current) use of oral hypoglycemic drugs: Secondary | ICD-10-CM | POA: Diagnosis not present

## 2023-12-28 DIAGNOSIS — Z7951 Long term (current) use of inhaled steroids: Secondary | ICD-10-CM | POA: Diagnosis not present

## 2023-12-28 DIAGNOSIS — J441 Chronic obstructive pulmonary disease with (acute) exacerbation: Secondary | ICD-10-CM | POA: Diagnosis not present

## 2023-12-28 DIAGNOSIS — Z85828 Personal history of other malignant neoplasm of skin: Secondary | ICD-10-CM | POA: Diagnosis not present

## 2023-12-28 DIAGNOSIS — I129 Hypertensive chronic kidney disease with stage 1 through stage 4 chronic kidney disease, or unspecified chronic kidney disease: Secondary | ICD-10-CM | POA: Diagnosis not present

## 2023-12-28 DIAGNOSIS — Z7952 Long term (current) use of systemic steroids: Secondary | ICD-10-CM | POA: Diagnosis not present

## 2023-12-28 DIAGNOSIS — K529 Noninfective gastroenteritis and colitis, unspecified: Secondary | ICD-10-CM | POA: Diagnosis not present

## 2023-12-28 DIAGNOSIS — D509 Iron deficiency anemia, unspecified: Secondary | ICD-10-CM | POA: Diagnosis not present

## 2023-12-28 DIAGNOSIS — J45901 Unspecified asthma with (acute) exacerbation: Secondary | ICD-10-CM | POA: Diagnosis not present

## 2023-12-28 MED ORDER — ALBUTEROL SULFATE HFA 108 (90 BASE) MCG/ACT IN AERS: INHALATION_SPRAY | RESPIRATORY_TRACT | 0 refills | Status: DC

## 2023-12-28 MED ORDER — BUDESONIDE 0.5 MG/2ML IN SUSP: 0.5000 mg | Freq: Two times a day (BID) | RESPIRATORY_TRACT | 5 refills | Status: DC

## 2023-12-28 MED ORDER — ALBUTEROL SULFATE (2.5 MG/3ML) 0.083% IN NEBU: 2.5000 mg | INHALATION_SOLUTION | RESPIRATORY_TRACT | 5 refills | Status: DC | PRN

## 2023-12-28 NOTE — Progress Notes (Signed)
 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  She was recently discharged from the hospital on 12/04/2023.  She was admitted to the hospital with colitis and sepsis and improved with a course of antibiotics with Rocephin  and Flagyl .  During her hospitalization she was noted to be wheezing on exam.  She was treated for an asthma exacerbation with IV steroids and nebulizer treatments.  Referral to pulmonary was made.   On further review of her chart, patient was seen in the pulmonary clinic between the period of October and December 2020.   Patient is present with her daughter today who helps her with managing her medical appointments. Patient is hard of hearing and relies on her daughter to help with communication.   Daughter states that ever since the patient was discharged from the hospital she has had wheezing episodes and gets short of breath.  She states that she has had the wheezing episodes despite being on prednisone .  She ran out of Symbicort  and was not taking any inhalers.  Daughter states that it is very hard for her mother to use the inhaler as she has problems with putting the inhaler device in her mouth and inhaling the medicine.            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   Spinal stenosis    Tuberculosis    1978   Weight loss, unintentional      Family History  Problem Relation Age of Onset   Arthritis Mother        Late 90s   Heart attack Father        Deceased, 55   Heart disease Brother    Hypertension Other    Hyperlipidemia Other    Diabetes type II  Sister    Diabetes Brother      Social History   Socioeconomic History   Marital status: Widowed    Spouse name: Not on file   Number of children: 4   Years of education: Not on file   Highest education level: Not on file  Occupational History   Not on file  Tobacco Use   Smoking status: Never   Smokeless tobacco: Never  Vaping Use   Vaping status: Never Used  Substance and Sexual Activity   Alcohol use: No    Alcohol/week: 0.0 standard drinks of alcohol   Drug use: No   Sexual activity: Never  Other Topics Concern   Not on file  Social History Narrative   Lives at home with two daughters.      Social Drivers of Corporate investment banker Strain: Not on file  Food Insecurity: No Food Insecurity (11/27/2023)   Hunger Vital Sign    Worried About Running Out of Food in the Last Year: Never true    Ran Out of Food in the Last Year: Never true  Transportation Needs: No Transportation Needs (11/27/2023)   PRAPARE - Administrator, Civil Service (Medical): No    Lack of Transportation (Non-Medical): No  Physical Activity: Not on file  Stress: Not on file  Social Connections: Unknown (11/27/2023)   Social Connection and Isolation Panel    Frequency of Communication with Friends and Family: Patient unable to answer    Frequency of Social Gatherings with Friends and Family: Patient unable to answer    Attends Religious Services: Patient unable to answer    Active Member of Clubs or Organizations: No    Attends Banker Meetings: Never    Marital Status: Patient unable to answer  Intimate Partner Violence: Not At Risk (11/27/2023)   Humiliation, Afraid, Rape, and Kick questionnaire    Fear of Current or Ex-Partner: No    Emotionally Abused: No    Physically Abused: No    Sexually Abused: No     Allergies  Allergen Reactions   Penicillins Rash and Other (See Comments)    1980's   Sulfa Antibiotics Rash     Outpatient Medications Prior to Visit   Medication Sig Dispense Refill   acetaminophen  (TYLENOL ) 325 MG tablet Take 650 mg by mouth every 6 (six) hours as needed for mild pain.      albuterol  (VENTOLIN  HFA) 108 (90 Base) MCG/ACT inhaler Inhale 2 puffs into the lungs 3 (three) times daily for 3 days, THEN 2 puffs every 6 (six) hours as needed for wheezing or shortness of breath. 18 g 0   amLODipine  (NORVASC ) 2.5 MG tablet Take 1 tablet (2.5 mg total) by mouth daily. 30 tablet 0   Cholecalciferol  (VITAMIN D -3) 125 MCG (5000 UT) TABS Take 5,000 Units by mouth daily.     Coenzyme Q10 (CO Q 10 PO) Take 1 capsule by mouth daily.     Continuous Glucose Sensor (FREESTYLE LIBRE 14 DAY SENSOR) MISC Inject 1 Device into the skin every 14 (fourteen) days.     Cyanocobalamin  (B-12 SL) Place 1 tablet under the tongue daily.     diclofenac  sodium (VOLTAREN ) 1 % GEL Apply 2 g topically 3 (three) times daily as needed (joint pain- affected areas).     donepezil  (ARICEPT ) 5 MG tablet Take 5 mg by mouth at bedtime.     ezetimibe -simvastatin  (VYTORIN ) 10-20 MG per tablet Take 1 tablet by mouth at bedtime.     HYDROcodone -acetaminophen  (NORCO/VICODIN) 5-325 MG tablet Take 1 tablet by mouth See admin instructions. Take 1 tablet by mouth with lunch and in the evening     insulin  glargine (LANTUS  SOLOSTAR) 100 UNIT/ML Solostar Pen Inject 20 Units into the skin at bedtime. 15 mL 0   insulin  lispro (HUMALOG) 100 UNIT/ML KwikPen Inject 2-12 Units into the skin See admin instructions. Inject 2-12 units into the skin three times a day with meals, per sliding scale     JANUVIA 50 MG tablet Take 50 mg by mouth daily.     latanoprost  (XALATAN ) 0.005 % ophthalmic solution Place 1 drop into both eyes at bedtime.     levothyroxine  (SYNTHROID ) 50 MCG tablet Take 1 tablet (50 mcg total) by mouth See admin instructions. Take 25mcg alternating 50mcg every other day. (Patient taking differently: Take 25-50 mcg by mouth See admin instructions. Take 25 mcg by mouth 30 minutes  before breakfast ALTERNATING WITH 50 mcg every OTHER morning) 30 tablet 0   lidocaine  (XYLOCAINE ) 2 % solution Use as directed 3 mLs in the mouth or throat daily as needed for mouth pain.     loratadine  (CLARITIN ) 10 MG tablet Take 1 tablet (10 mg total) by mouth daily. 30 tablet 0   megestrol  (MEGACE ) 400 MG/10ML suspension Take 600 mg by mouth  daily before supper.     metoprolol  tartrate (LOPRESSOR ) 25 MG tablet Take 1 tablet (25 mg total) by mouth 2 (two) times daily. (Patient taking differently: Take 25 mg by mouth in the morning and at bedtime.) 60 tablet 0   MYRBETRIQ  25 MG TB24 tablet Take 25 mg by mouth in the morning.     omeprazole  (PRILOSEC) 20 MG capsule Take 1 capsule (20 mg total) by mouth every other day. 30 capsule 1   ondansetron  (ZOFRAN ) 4 MG tablet Take 1 tablet (4 mg total) by mouth every 8 (eight) hours as needed for nausea or vomiting. 12 tablet 0   simethicone  (MYLICON) 80 MG chewable tablet Chew 1 tablet (80 mg total) by mouth every 6 (six) hours as needed for flatulence.     SYMBICORT  160-4.5 MCG/ACT inhaler Inhale 2 puffs into the lungs in the morning and at bedtime. 6 g 1   No facility-administered medications prior to visit.    ROS    Objective:  There were no vitals filed for this visit.   Physical Exam General: Elderly female not in acute distress Chest: Clear to auscultation bilaterally, coughs with exhalation, minimal rhonchi heard at the bases of the lungs Cardiac: Regular rate and rhythm, normal S1, normal S2 Lower extremities: Trace edema Neuro: Grossly intact     CBC    Component Value Date/Time   WBC 21.8 (H) 12/04/2023 0516   RBC 3.88 12/04/2023 0516   HGB 11.8 (L) 12/04/2023 0516   HGB 11.8 09/14/2019 0837   HCT 36.2 12/04/2023 0516   HCT 37.4 09/14/2019 0837   PLT 322 12/04/2023 0516   PLT 267 09/14/2019 0837   MCV 93.3 12/04/2023 0516   MCV 88 09/14/2019 0837   MCH 30.4 12/04/2023 0516   MCHC 32.6 12/04/2023 0516   RDW 13.2  12/04/2023 0516   RDW 13.2 09/14/2019 0837   LYMPHSABS 2.5 12/04/2023 0516   MONOABS 1.6 (H) 12/04/2023 0516   EOSABS 0.0 12/04/2023 0516   BASOSABS 0.0 12/04/2023 0516     Chest imaging:  I reviewed the CT chest performed on 11/26/2023.  Evidence of apical scarring at the right lung appears stable compared to CT chest performed in 2020.  There is no evidence of interstitial lung disease.  PFT:    Latest Ref Rng & Units 04/19/2019    1:47 PM  PFT Results  FVC-Pre L 1.53   FVC-Predicted Pre % 69   FVC-Post L 1.57   FVC-Predicted Post % 71   Pre FEV1/FVC % % 66   Post FEV1/FCV % % 69   FEV1-Pre L 1.01   FEV1-Predicted Pre % 62   FEV1-Post L 1.08   DLCO uncorrected ml/min/mmHg 9.57   DLCO UNC% % 55   DLVA Predicted % 86   TLC L 3.87   TLC % Predicted % 81   RV % Predicted % 101     Patient's PFTs show evidence of obstruction with a reduced FEV1/FVC ratio.  She has a preserved TLC and a reduced DLCO.  Labs: On review of her laboratory workup she has had elevated eosinophils in the past but nothing of recently 2025. On 11/26/2023 she was noted to have 300 eos but with sepsis and steroids that he was dropped to 0.  Echo: Complete echocardiogram 2022 normal LVEF.  Normal RV systolic function and volume.  Left ventricular diastolic parameters consistent with diastolic dysfunction         Assessment & Plan:   No diagnosis  found. Assessment and Plan This is an 89 year old female with a past medical history of suspected asthma previously on Symbicort  and albuterol .  Recently hospitalized with colitis and was noted to be wheezing and was discharged on a course of prednisone .  She has run out of prednisone , as well as her home inhalers including Symbicort  and albuterol .   Her PFTs show evidence of mixed obstruction and restriction, however I question her ability to perform adequate PFTs.  She has had elevated eosinophils in the past that can suggest an element of asthma.  She is  not able to do FeNO despite 3 attempts today in the clinic.  Plan to treat as suspected asthma  -Plan to order nebulizer machine for her today - Start Pulmicort  nebs twice daily - Start albuterol  nebs as needed - I refilled her as needed albuterol  inhaler as well  #Asthma  She is not able to use inhalers at this point given her prior resection of buccal cancer, as well as being an elderly frail woman.  Discussed with daughter plan to start Pulmicort  nebs twice daily as well as albuterol  nebs as needed.  In the future can consider adding Brovana  and Yupelri if no improvement.  Discussed with daughter differential diagnosis, she does not report any GERD symptoms or postnasal drip.  I reviewed her chest CT and there is no evidence of structural lung damage other than minimal apical scaring that has been stable over the years.  She does have some trace edema in her lower extremities which the daughter also notes.  I advised her to keep checking on her lower extremities to assess if there is more swelling with time, and should there be any worsening swelling she should reach out to her primary care.  PFTs with restriction  As discussed above   #Biapical lung scarring  I reviewed her chest CTs and her biapical scarring appears stable.  Slightly worse apical scarring at the right apex.  This could possibly contribute to her reduced DLCO although not to the extent of how low her DLCO is.  She had an echocardiogram which shows some diastolic dysfunction however she does not have any evidence of pulmonary hypertension.   #Hx of diastolic congestive heart failure  Advised to continue to check on her lower extremities for any worsening swelling.  She is currently not on any diuretic however her lower extremity edema is is only trace at this time.            Zola Herter, MD [facility] Office: [phone]

## 2023-12-29 DIAGNOSIS — N1831 Chronic kidney disease, stage 3a: Secondary | ICD-10-CM | POA: Diagnosis not present

## 2023-12-29 DIAGNOSIS — E039 Hypothyroidism, unspecified: Secondary | ICD-10-CM | POA: Diagnosis not present

## 2023-12-29 DIAGNOSIS — J45901 Unspecified asthma with (acute) exacerbation: Secondary | ICD-10-CM | POA: Diagnosis not present

## 2023-12-29 DIAGNOSIS — Z7952 Long term (current) use of systemic steroids: Secondary | ICD-10-CM | POA: Diagnosis not present

## 2023-12-29 DIAGNOSIS — D509 Iron deficiency anemia, unspecified: Secondary | ICD-10-CM | POA: Diagnosis not present

## 2023-12-29 DIAGNOSIS — D631 Anemia in chronic kidney disease: Secondary | ICD-10-CM | POA: Diagnosis not present

## 2023-12-29 DIAGNOSIS — Z794 Long term (current) use of insulin: Secondary | ICD-10-CM | POA: Diagnosis not present

## 2023-12-29 DIAGNOSIS — E7800 Pure hypercholesterolemia, unspecified: Secondary | ICD-10-CM | POA: Diagnosis not present

## 2023-12-29 DIAGNOSIS — Z9181 History of falling: Secondary | ICD-10-CM | POA: Diagnosis not present

## 2023-12-29 DIAGNOSIS — E1122 Type 2 diabetes mellitus with diabetic chronic kidney disease: Secondary | ICD-10-CM | POA: Diagnosis not present

## 2023-12-29 DIAGNOSIS — I700 Atherosclerosis of aorta: Secondary | ICD-10-CM | POA: Diagnosis not present

## 2023-12-29 DIAGNOSIS — I129 Hypertensive chronic kidney disease with stage 1 through stage 4 chronic kidney disease, or unspecified chronic kidney disease: Secondary | ICD-10-CM | POA: Diagnosis not present

## 2023-12-29 DIAGNOSIS — K449 Diaphragmatic hernia without obstruction or gangrene: Secondary | ICD-10-CM | POA: Diagnosis not present

## 2023-12-29 DIAGNOSIS — J4520 Mild intermittent asthma, uncomplicated: Secondary | ICD-10-CM

## 2023-12-29 DIAGNOSIS — Z85828 Personal history of other malignant neoplasm of skin: Secondary | ICD-10-CM | POA: Diagnosis not present

## 2023-12-29 DIAGNOSIS — M5416 Radiculopathy, lumbar region: Secondary | ICD-10-CM | POA: Diagnosis not present

## 2023-12-29 DIAGNOSIS — Z7951 Long term (current) use of inhaled steroids: Secondary | ICD-10-CM | POA: Diagnosis not present

## 2023-12-29 DIAGNOSIS — Z7984 Long term (current) use of oral hypoglycemic drugs: Secondary | ICD-10-CM | POA: Diagnosis not present

## 2023-12-29 DIAGNOSIS — K529 Noninfective gastroenteritis and colitis, unspecified: Secondary | ICD-10-CM | POA: Diagnosis not present

## 2023-12-29 DIAGNOSIS — Z85818 Personal history of malignant neoplasm of other sites of lip, oral cavity, and pharynx: Secondary | ICD-10-CM | POA: Diagnosis not present

## 2023-12-29 DIAGNOSIS — J984 Other disorders of lung: Secondary | ICD-10-CM

## 2023-12-29 DIAGNOSIS — J441 Chronic obstructive pulmonary disease with (acute) exacerbation: Secondary | ICD-10-CM | POA: Diagnosis not present

## 2023-12-29 DIAGNOSIS — Z86018 Personal history of other benign neoplasm: Secondary | ICD-10-CM | POA: Diagnosis not present

## 2023-12-30 DIAGNOSIS — J4520 Mild intermittent asthma, uncomplicated: Secondary | ICD-10-CM | POA: Diagnosis not present

## 2023-12-30 DIAGNOSIS — R54 Age-related physical debility: Secondary | ICD-10-CM | POA: Diagnosis not present

## 2023-12-30 NOTE — Telephone Encounter (Signed)
 Please advise as I do see a steroid in AVS

## 2024-01-01 MED ORDER — BUDESONIDE 0.5 MG/2ML IN SUSP: 0.5000 mg | Freq: Two times a day (BID) | RESPIRATORY_TRACT | 5 refills | Status: AC

## 2024-01-03 DIAGNOSIS — J4520 Mild intermittent asthma, uncomplicated: Secondary | ICD-10-CM | POA: Diagnosis not present

## 2024-01-10 DIAGNOSIS — R54 Age-related physical debility: Secondary | ICD-10-CM | POA: Diagnosis not present

## 2024-01-13 DIAGNOSIS — J449 Chronic obstructive pulmonary disease, unspecified: Secondary | ICD-10-CM | POA: Diagnosis not present

## 2024-01-13 DIAGNOSIS — Z0000 Encounter for general adult medical examination without abnormal findings: Secondary | ICD-10-CM | POA: Diagnosis not present

## 2024-01-13 DIAGNOSIS — E1165 Type 2 diabetes mellitus with hyperglycemia: Secondary | ICD-10-CM | POA: Diagnosis not present

## 2024-01-13 DIAGNOSIS — E785 Hyperlipidemia, unspecified: Secondary | ICD-10-CM | POA: Diagnosis not present

## 2024-01-13 DIAGNOSIS — R600 Localized edema: Secondary | ICD-10-CM | POA: Diagnosis not present

## 2024-01-13 DIAGNOSIS — E43 Unspecified severe protein-calorie malnutrition: Secondary | ICD-10-CM | POA: Diagnosis not present

## 2024-01-13 DIAGNOSIS — I10 Essential (primary) hypertension: Secondary | ICD-10-CM | POA: Diagnosis not present

## 2024-01-13 DIAGNOSIS — M85859 Other specified disorders of bone density and structure, unspecified thigh: Secondary | ICD-10-CM | POA: Diagnosis not present

## 2024-01-13 DIAGNOSIS — N183 Chronic kidney disease, stage 3 unspecified: Secondary | ICD-10-CM | POA: Diagnosis not present

## 2024-01-13 DIAGNOSIS — E1122 Type 2 diabetes mellitus with diabetic chronic kidney disease: Secondary | ICD-10-CM | POA: Diagnosis not present

## 2024-01-13 DIAGNOSIS — E039 Hypothyroidism, unspecified: Secondary | ICD-10-CM | POA: Diagnosis not present

## 2024-01-20 DIAGNOSIS — R350 Frequency of micturition: Secondary | ICD-10-CM | POA: Diagnosis not present

## 2024-02-10 ENCOUNTER — Ambulatory Visit (INDEPENDENT_AMBULATORY_CARE_PROVIDER_SITE_OTHER): Admitting: Audiology

## 2024-02-10 ENCOUNTER — Ambulatory Visit (INDEPENDENT_AMBULATORY_CARE_PROVIDER_SITE_OTHER): Admitting: Otolaryngology

## 2024-02-10 VITALS — BP 109/74 | HR 105 | Temp 97.9°F | Ht 61.0 in | Wt 97.0 lb

## 2024-02-10 DIAGNOSIS — H903 Sensorineural hearing loss, bilateral: Secondary | ICD-10-CM

## 2024-02-10 DIAGNOSIS — D333 Benign neoplasm of cranial nerves: Secondary | ICD-10-CM

## 2024-02-10 NOTE — Progress Notes (Signed)
 CC: Hearing loss, right ear vestibular schwannoma  HPI:  Hannah [name] is a 89 y.o. female who presents today for evaluation of her hearing loss and right ear vestibular schwannoma.  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  The mass extends to the fundus.  Her last MRI scan was in 2021.  According to her daughters, the patient was admitted to the hospital in July for treatment of her colitis.  She was treated with multiple IV antibiotics.  Since the hospitalization, the patient has been complaining of decreased hearing.  Currently the patient denies any otalgia, otorrhea, or dizziness.  She currently wears bilateral hearing aids.  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   Spinal stenosis    Tuberculosis    1978   Weight loss, unintentional     Past Surgical History:  Procedure Laterality Date   AORTIC ARCH ANGIOGRAPHY  09/2019   BACK SURGERY     COLONOSCOPY     EYE SURGERY Bilateral    cataracts   LIPOMA EXCISION     back   LUMBAR LAMINECTOMY/DECOMPRESSION MICRODISCECTOMY Right 04/12/2018   Procedure: Laminectomy and Foraminotomy - Lumbar four-Lumbar five - Lumbar five-Sacral one - right;  Surgeon: Joshua Alm RAMAN, MD;  Location: [facility] OR;  Service: Neurosurgery;  Laterality: Right;   LUMBAR SPINE SURGERY  05/2019   MASS EXCISION Left 04/02/2019   Procedure: EXCISION OF LIPOMATOUS MASS OF THE LEFT BACK;  Surgeon: Gladis Cough, MD;  Location: [facility];  Service: General;  Laterality: Left;   OTHER SURGICAL HISTORY     squamous cell removal;skin graft   RIGHT/LEFT HEART CATH AND CORONARY ANGIOGRAPHY N/A 09/18/2019   Procedure: RIGHT/LEFT HEART CATH AND CORONARY ANGIOGRAPHY;  Surgeon: Ladona Heinz, MD;  Location: [facility];  Service: Cardiovascular;  Laterality: N/A;   TOOTH  EXTRACTION     all pulled    Family History  Problem Relation Age of Onset   Arthritis Mother        Late 90s   Heart attack Father        Deceased, 47   Heart disease Brother    Hypertension Other    Hyperlipidemia Other    Diabetes type II Sister    Diabetes Brother     Social History:  reports that she has never smoked. She has been exposed to tobacco smoke. She has never used smokeless tobacco. She reports that she does not drink alcohol and does not use drugs.  Allergies:  Allergies  Allergen Reactions   Penicillins Rash and Other (See Comments)    1980's   Sulfa Antibiotics Rash    Prior to Admission medications   Medication Sig Start Date End Date Taking? Authorizing Provider  acetaminophen  (TYLENOL ) 325 MG tablet Take 650 mg by mouth every 6 (six) hours as needed for mild pain.    Yes [provider]  albuterol  (PROVENTIL ) (2.5 MG/3ML) 0.083% nebulizer solution Take 3 mLs (2.5 mg total) by nebulization every 4 (four) hours as needed for wheezing or shortness of breath. 12/28/23 12/27/24 Yes Hattar, Zola SAILOR, MD  albuterol  (VENTOLIN  HFA) 108 (90 Base) MCG/ACT inhaler Inhale 2 puffs into the lungs 3 (three) times daily for 3 days, THEN 2 puffs every 6 (six) hours as needed for wheezing  or shortness of breath. 12/28/23 12/30/24 Yes Hattar, Zola SAILOR, MD  amLODipine  (NORVASC ) 2.5 MG tablet Take 1 tablet (2.5 mg total) by mouth daily. 12/04/23  Yes Sebastian Toribio GAILS, MD  budesonide  (PULMICORT ) 0.5 MG/2ML nebulizer solution Take 2 mLs (0.5 mg total) by nebulization in the morning and at bedtime. 01/01/24  Yes Hattar, Zola SAILOR, MD  Ca Carbonate-Mag Hydroxide 1000-200 MG CHEW Chew by mouth.   Yes [provider]  CARBONYL IRON  PO Take 1 tablet by mouth daily.   Yes [provider]  Cholecalciferol  (VITAMIN D -3) 125 MCG (5000 UT) TABS Take 5,000 Units by mouth daily.   Yes [provider]  Coenzyme Q10 (CO Q 10 PO) Take 1 capsule by mouth daily.   Yes  [provider]  Continuous Glucose Sensor (FREESTYLE LIBRE 14 DAY SENSOR) MISC Inject 1 Device into the skin every 14 (fourteen) days. 11/18/23  Yes [provider]  Cyanocobalamin  (B-12 SL) Place 1 tablet under the tongue daily.   Yes [provider]  diclofenac  sodium (VOLTAREN ) 1 % GEL Apply 2 g topically 3 (three) times daily as needed (joint pain- affected areas).   Yes [provider]  donepezil  (ARICEPT ) 5 MG tablet Take 5 mg by mouth at bedtime.   Yes [provider]  ezetimibe -simvastatin  (VYTORIN ) 10-20 MG per tablet Take 1 tablet by mouth at bedtime.   Yes [provider]  HYDROcodone -acetaminophen  (NORCO/VICODIN) 5-325 MG tablet Take 1 tablet by mouth See admin instructions. Take 1 tablet by mouth with lunch and in the evening 10/24/23  Yes [provider]  insulin  glargine (LANTUS  SOLOSTAR) 100 UNIT/ML Solostar Pen Inject 20 Units into the skin at bedtime. 12/04/23  Yes Sebastian Toribio GAILS, MD  insulin  lispro (HUMALOG) 100 UNIT/ML KwikPen Inject 2-12 Units into the skin See admin instructions. Inject 2-12 units into the skin three times a day with meals, per sliding scale   Yes [provider]  JANUVIA 50 MG tablet Take 50 mg by mouth daily.   Yes [provider]  latanoprost  (XALATAN ) 0.005 % ophthalmic solution Place 1 drop into both eyes at bedtime. 02/20/21  Yes [provider]  levothyroxine  (SYNTHROID ) 50 MCG tablet Take 1 tablet (50 mcg total) by mouth See admin instructions. Take 25mcg alternating 50mcg every other day. 03/03/21  Yes Gherghe, Costin M, MD  lidocaine  (XYLOCAINE ) 2 % solution Use as directed 3 mLs in the mouth or throat daily as needed for mouth pain. 04/28/18  Yes [provider]  loratadine  (CLARITIN ) 10 MG tablet Take 1 tablet (10 mg total) by mouth daily. 12/04/23  Yes Sebastian Toribio GAILS, MD  magnesium  oxide (MAG-OX) 400 (240 Mg) MG tablet Take 400 mg by mouth daily.   Yes  [provider]  megestrol  (MEGACE ) 400 MG/10ML suspension Take 600 mg by mouth daily before supper.   Yes [provider]  metoprolol  tartrate (LOPRESSOR ) 25 MG tablet Take 1 tablet (25 mg total) by mouth 2 (two) times daily. Patient taking differently: Take 25 mg by mouth in the morning and at bedtime. 03/03/21  Yes Gherghe, Costin M, MD  MYRBETRIQ  25 MG TB24 tablet Take 25 mg by mouth in the morning. 12/03/20  Yes [provider]  omeprazole  (PRILOSEC) 20 MG capsule Take 1 capsule (20 mg total) by mouth every other day. 12/04/23  Yes Sebastian Toribio GAILS, MD  ondansetron  (ZOFRAN ) 4 MG tablet Take 1 tablet (4 mg total) by mouth every 8 (eight) hours as needed  for nausea or vomiting. 02/15/23  Yes Jerrol Agent, MD  simethicone  (MYLICON) 80 MG chewable tablet Chew 1 tablet (80 mg total) by mouth every 6 (six) hours as needed for flatulence. 12/03/23  Yes Sebastian Toribio GAILS, MD  Vibegron (GEMTESA) 75 MG TABS Take 75 mg by mouth daily.   Yes [provider]    Blood pressure 109/74, pulse (!) 105, temperature 97.9 F (36.6 C), height 5' 1 (1.549 m), weight 97 lb (44 kg), SpO2 91%. Exam: General: Communicates without difficulty, well nourished, no acute distress. Head: Normocephalic, no evidence injury, no tenderness, facial buttresses intact without stepoff. Face/sinus: No tenderness to palpation and percussion. Facial movement is normal and symmetric. Eyes: PERRL, EOMI. No scleral icterus, conjunctivae clear. Neuro: CN II exam reveals vision grossly intact.  No nystagmus at any point of gaze. Ears: Auricles well formed without lesions.  Ear canals are intact without mass or lesion.  No erythema or edema is appreciated.  The TMs are intact without fluid. Nose: External evaluation reveals normal support and skin without lesions.  Dorsum is intact.  Anterior rhinoscopy reveals normal mucosa over anterior aspect of inferior turbinates and intact septum.  No purulence noted.  Oral:  Oral cavity and oropharynx are intact, symmetric, without erythema or edema.  Mucosa is moist without lesions. Neck: Full range of motion without pain.  There is no significant lymphadenopathy.  No masses palpable.  Thyroid  bed within normal limits to palpation.  Parotid glands and submandibular glands equal bilaterally without mass.  Trachea is midline. Neuro:  CN 2-12 grossly intact.   Her hearing test shows stable bilateral symmetric high-frequency sensorineural hearing loss.  Assessment: 1.  Right ear intracanalicular vestibular schwannoma.  Her last MRI in 2021 showed a 4.7 mm tumor.  The patient is currently being treated with conservative observation. 2.  Bilateral symmetric sensorineural hearing loss.  Her hearing thresholds are similar to her previous audiogram in 2021.  Plan: 1.  The physical exam findings and the hearing test results are reviewed with the patient. 2.  The treatment options for vestibular schwannoma are extensively discussed.  The options include conservative observation, surgical resection, or gamma knife radiation.  The pros, cons, and details of each treatment modality are reviewed. 3.  Repeat MRI scan to evaluate the stability of her vestibular schwannoma. 4.  The patient would like to continue with conservative observation. 5.  Hearing aid adjustment. 6.  The patient will return for reevaluation in 6 months.  Ioannis Schuh W Yevette Knust 02/10/2024, 11:11 AM

## 2024-02-10 NOTE — Progress Notes (Signed)
  [address], [address] [place], [place] [address] [phone]  Audiological Evaluation    Name: Hannah [name]     DOB:   [birth year]      MRN:   [id number]                                                                                     Service Date: 02/10/2024     Accompanied by: unaccompanied   Patient comes today after Dr. Karis, ENT sent a referral for a hearing evaluation due to concerns with hearing loss.   Symptoms Yes Details  Hearing loss  [x]  Patient with a longstanding history of right ear hearing loss. Has new hearing aids fit in May 2025 , then went to the ED in July and since then has not been able to hear well.   Tinnitus  []    Ear pain/ infections/pressure  []    Balance problems  []    Noise exposure history  []    Previous ear surgeries  []    Family history of hearing loss  [x]  Has a set of Phonak hearing aids from [facility]  Amplification  []    Other  [x]  Family has some concerns with her having some memory loss.    Otoscopy: Right ear: Clear external ear canal and notable landmarks visualized on the tympanic membrane. Left ear:  Clear external ear canal and notable landmarks visualized on the tympanic membrane.  Tympanometry: Right ear: Type A- Normal external ear canal volume with normal middle ear pressure and tympanic membrane compliance. Left ear: Type A- Normal external ear canal volume with normal middle ear pressure and tympanic membrane compliance.    Pure tone Audiometry: Both ears -Moderate to severe sensorineural hearing loss from 125 Hz - 8000 Hz.   Speech Audiometry: Right ear- Speech Reception Threshold (SRT) was obtained at 60 dBHL. Left ear-Speech Reception Threshold (SRT) was obtained at 50(?) dBHL.   Word Recognition Score Tested using PBK-50 (live) Noticed patient was able to repeat words better in the left ear than the right ear. Unable to fairly assess this portion due language barrier.    The hearing test results were  completed under headphones and results are deemed to be of fair reliability. Test technique:  conventional    Impression: There is not a significant difference in pure-tone thresholds between ears. No previous audiograms available to compare with today's test results.   Recommendations: Follow up with ENT as scheduled for today. Return for a hearing evaluation if concerns with hearing changes arise or per MD recommendation. Recommend to  have his hering aids reporgrammed based on today's hearing test results, if there was a change in hearing. Recommend completing Real Ear Measurements .  Also recommended cosnidering a TV connector for her phonak hearing aids or using TV ears/pockettalker to helpher be able to watch TV with the family.    Deane Melick MARIE LEROUX-MARTINEZ, AUD

## 2024-02-12 DIAGNOSIS — H903 Sensorineural hearing loss, bilateral: Secondary | ICD-10-CM | POA: Insufficient documentation

## 2024-02-12 DIAGNOSIS — D333 Benign neoplasm of cranial nerves: Secondary | ICD-10-CM | POA: Insufficient documentation

## 2024-02-17 ENCOUNTER — Other Ambulatory Visit (HOSPITAL_COMMUNITY): Payer: Self-pay

## 2024-02-17 ENCOUNTER — Ambulatory Visit

## 2024-02-17 ENCOUNTER — Ambulatory Visit: Attending: Cardiology | Admitting: Cardiology

## 2024-02-17 ENCOUNTER — Encounter: Payer: Self-pay | Admitting: Cardiology

## 2024-02-17 ENCOUNTER — Telehealth: Payer: Self-pay | Admitting: Pharmacy Technician

## 2024-02-17 VITALS — BP 120/68 | HR 112 | Resp 16 | Ht 61.0 in | Wt 101.8 lb

## 2024-02-17 VITALS — BP 112/70 | HR 102 | Temp 98.1°F | Ht 61.0 in | Wt 101.8 lb

## 2024-02-17 DIAGNOSIS — Z79899 Other long term (current) drug therapy: Secondary | ICD-10-CM | POA: Diagnosis not present

## 2024-02-17 DIAGNOSIS — M961 Postlaminectomy syndrome, not elsewhere classified: Secondary | ICD-10-CM | POA: Diagnosis not present

## 2024-02-17 DIAGNOSIS — M47816 Spondylosis without myelopathy or radiculopathy, lumbar region: Secondary | ICD-10-CM | POA: Diagnosis not present

## 2024-02-17 DIAGNOSIS — R0609 Other forms of dyspnea: Secondary | ICD-10-CM | POA: Diagnosis not present

## 2024-02-17 DIAGNOSIS — J4489 Other specified chronic obstructive pulmonary disease: Secondary | ICD-10-CM

## 2024-02-17 DIAGNOSIS — I5033 Acute on chronic diastolic (congestive) heart failure: Secondary | ICD-10-CM

## 2024-02-17 DIAGNOSIS — J984 Other disorders of lung: Secondary | ICD-10-CM

## 2024-02-17 DIAGNOSIS — J4520 Mild intermittent asthma, uncomplicated: Secondary | ICD-10-CM

## 2024-02-17 DIAGNOSIS — M5416 Radiculopathy, lumbar region: Secondary | ICD-10-CM | POA: Diagnosis not present

## 2024-02-17 MED ORDER — FUROSEMIDE 20 MG PO TABS
20.0000 mg | ORAL_TABLET | Freq: Every day | ORAL | 2 refills | Status: DC | PRN
  Filled 2024-02-17: qty 30, 30d supply, fill #0

## 2024-02-17 MED ORDER — ARFORMOTEROL TARTRATE 15 MCG/2ML IN NEBU: 15.0000 ug | INHALATION_SOLUTION | Freq: Two times a day (BID) | RESPIRATORY_TRACT | 6 refills | Status: DC

## 2024-02-17 MED ORDER — KERENDIA 10 MG PO TABS
1.0000 | ORAL_TABLET | Freq: Every day | ORAL | 2 refills | Status: DC
  Filled 2024-02-17: qty 30, 30d supply, fill #0

## 2024-02-17 NOTE — Telephone Encounter (Signed)
 Pharmacy Patient Advocate Encounter  Received notification from [facility] that Prior Authorization for Hannah [name] has been APPROVED from 02/17/24 to 05/16/25   PA #/Case ID/Reference #: [id number]

## 2024-02-17 NOTE — Progress Notes (Signed)
 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   She was recently discharged from the hospital on 12/04/2023.  She was admitted to the hospital with colitis and sepsis and improved with a course of antibiotics with Rocephin  and Flagyl .  During her hospitalization she was noted to be wheezing on exam.  She was treated for an asthma exacerbation with IV steroids and nebulizer treatments.  Referral to pulmonary was made.     On further review of her chart, patient was seen in the pulmonary clinic between the period of October and December 2020.    Patient is present with her daughter today who helps her with managing her medical appointments. Patient is hard of hearing and relies on her daughter to help with communication.     Daughter states that ever since the patient was discharged from the hospital she has had wheezing episodes and gets short of breath.  She states that she has had the wheezing episodes despite being on prednisone .  She ran out of Symbicort  and was not taking any inhalers.   Daughter states that it is very hard for her mother to use the inhaler as she has problems with putting the inhaler device in her mouth and inhaling the medicine.     02/17/24:  Patient was doing much better with nebulized steroids and albuterol , some regression in her improvement with more fluid build up. Recently seen in the cardiology office and started on lasix . Using albuterol  often with the Pulmicort .    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   Spinal stenosis    Tuberculosis    1978   Weight loss,  unintentional      Family History  Problem Relation Age of Onset   Arthritis Mother        Late 90s   Heart attack Father        Deceased, 65   Heart disease Brother    Hypertension Other    Hyperlipidemia Other    Diabetes type II Sister    Diabetes Brother      Social History   Socioeconomic History   Marital status: Widowed    Spouse name: Not on file   Number of children: 4   Years of education: Not on file   Highest education level: Not on file  Occupational History   Not on file  Tobacco Use   Smoking status: Never    Passive exposure: Past   Smokeless tobacco: Never  Vaping Use   Vaping status: Never Used  Substance and Sexual Activity   Alcohol use: No    Alcohol/week: 0.0 standard drinks of alcohol   Drug use: No   Sexual activity: Never  Other Topics Concern   Not on file  Social History Narrative   Lives at home with two daughters.      Social Drivers of Corporate investment banker Strain: Not on file  Food Insecurity: No Food Insecurity (11/27/2023)   Hunger Vital Sign    Worried About Running Out of Food in the Last Year: Never true    Ran Out  of Food in the Last Year: Never true  Transportation Needs: No Transportation Needs (11/27/2023)   PRAPARE - Administrator, Civil Service (Medical): No    Lack of Transportation (Non-Medical): No  Physical Activity: Not on file  Stress: Not on file  Social Connections: Unknown (11/27/2023)   Social Connection and Isolation Panel    Frequency of Communication with Friends and Family: Patient unable to answer    Frequency of Social Gatherings with Friends and Family: Patient unable to answer    Attends Religious Services: Patient unable to answer    Active Member of Clubs or Organizations: No    Attends Banker Meetings: Never    Marital Status: Patient unable to answer  Intimate Partner Violence: Not At Risk (11/27/2023)   Humiliation, Afraid, Rape, and Kick questionnaire    Fear  of Current or Ex-Partner: No    Emotionally Abused: No    Physically Abused: No    Sexually Abused: No     Allergies  Allergen Reactions   Penicillins Rash and Other (See Comments)    1980's   Sulfa Antibiotics Rash     Outpatient Medications Prior to Visit  Medication Sig Dispense Refill   acetaminophen  (TYLENOL ) 325 MG tablet Take 650 mg by mouth every 6 (six) hours as needed for mild pain.      albuterol  (PROVENTIL ) (2.5 MG/3ML) 0.083% nebulizer solution Take 3 mLs (2.5 mg total) by nebulization every 4 (four) hours as needed for wheezing or shortness of breath. 75 mL 5   albuterol  (VENTOLIN  HFA) 108 (90 Base) MCG/ACT inhaler Inhale 2 puffs into the lungs 3 (three) times daily for 3 days, THEN 2 puffs every 6 (six) hours as needed for wheezing or shortness of breath. 18 g 0   amLODipine  (NORVASC ) 2.5 MG tablet Take 1 tablet (2.5 mg total) by mouth daily. 30 tablet 0   budesonide  (PULMICORT ) 0.5 MG/2ML nebulizer solution Take 2 mLs (0.5 mg total) by nebulization in the morning and at bedtime. 360 mL 5   Ca Carbonate-Mag Hydroxide 1000-200 MG CHEW Chew by mouth.     CARBONYL IRON  PO Take 1 tablet by mouth daily.     Cholecalciferol  (VITAMIN D -3) 125 MCG (5000 UT) TABS Take 5,000 Units by mouth daily.     Coenzyme Q10 (CO Q 10 PO) Take 1 capsule by mouth daily.     Continuous Glucose Sensor (FREESTYLE LIBRE 14 DAY SENSOR) MISC Inject 1 Device into the skin every 14 (fourteen) days.     Cyanocobalamin  (B-12 SL) Place 1 tablet under the tongue daily.     diclofenac  sodium (VOLTAREN ) 1 % GEL Apply 2 g topically 3 (three) times daily as needed (joint pain- affected areas).     donepezil  (ARICEPT ) 5 MG tablet Take 5 mg by mouth at bedtime.     ezetimibe -simvastatin  (VYTORIN ) 10-20 MG per tablet Take 1 tablet by mouth at bedtime.     Finerenone (KERENDIA) 10 MG TABS Take 1 tablet (10 mg total) by mouth daily. 30 tablet 2   furosemide  (LASIX ) 20 MG tablet Take 1 tablet (20 mg total) by mouth  daily as needed. 30 tablet 2   HYDROcodone -acetaminophen  (NORCO/VICODIN) 5-325 MG tablet Take 1 tablet by mouth See admin instructions. Take 1 tablet by mouth with lunch and in the evening     insulin  glargine (LANTUS  SOLOSTAR) 100 UNIT/ML Solostar Pen Inject 20 Units into the skin at bedtime. 15 mL 0   insulin  lispro (HUMALOG) 100  UNIT/ML KwikPen Inject 2-12 Units into the skin See admin instructions. Inject 2-12 units into the skin three times a day with meals, per sliding scale     JANUVIA 50 MG tablet Take 50 mg by mouth daily.     latanoprost  (XALATAN ) 0.005 % ophthalmic solution Place 1 drop into both eyes at bedtime.     levothyroxine  (SYNTHROID ) 50 MCG tablet Take 1 tablet (50 mcg total) by mouth See admin instructions. Take 25mcg alternating 50mcg every other day. 30 tablet 0   lidocaine  (XYLOCAINE ) 2 % solution Use as directed 3 mLs in the mouth or throat daily as needed for mouth pain.     loratadine  (CLARITIN ) 10 MG tablet Take 1 tablet (10 mg total) by mouth daily. 30 tablet 0   magnesium  oxide (MAG-OX) 400 (240 Mg) MG tablet Take 400 mg by mouth daily.     megestrol  (MEGACE ) 400 MG/10ML suspension Take 600 mg by mouth daily before supper.     metoprolol  tartrate (LOPRESSOR ) 25 MG tablet Take 1 tablet (25 mg total) by mouth 2 (two) times daily. 60 tablet 0   MYRBETRIQ  25 MG TB24 tablet Take 25 mg by mouth in the morning.     omeprazole  (PRILOSEC) 20 MG capsule Take 1 capsule (20 mg total) by mouth every other day. 30 capsule 1   ondansetron  (ZOFRAN ) 4 MG tablet Take 1 tablet (4 mg total) by mouth every 8 (eight) hours as needed for nausea or vomiting. 12 tablet 0   oxybutynin (DITROPAN-XL) 10 MG 24 hr tablet Take 10 mg by mouth daily.     simethicone  (MYLICON) 80 MG chewable tablet Chew 1 tablet (80 mg total) by mouth every 6 (six) hours as needed for flatulence.     Vibegron (GEMTESA) 75 MG TABS Take 75 mg by mouth daily.     No facility-administered medications prior to visit.     ROS Reviewed all systems and reported negative except as above     Objective:   Vitals:   02/17/24 1058  BP: 112/70  Pulse: (!) 102  Temp: 98.1 F (36.7 C)  TempSrc: Oral  SpO2: 99%  Weight: 101 lb 12.8 oz (46.2 kg)  Height: 5' 1 (1.549 m)    Physical Exam General: Elderly female not in distress Chest: CTAB, no rhonci or wheezing Cardiac: RRR, Nl S1,S2 Lower extremities :  bilateral ankle edema  Neuro : Intact     CBC    Component Value Date/Time   WBC 21.8 (H) 12/04/2023 0516   RBC 3.88 12/04/2023 0516   HGB 11.8 (L) 12/04/2023 0516   HGB 11.8 09/14/2019 0837   HCT 36.2 12/04/2023 0516   HCT 37.4 09/14/2019 0837   PLT 322 12/04/2023 0516   PLT 267 09/14/2019 0837   MCV 93.3 12/04/2023 0516   MCV 88 09/14/2019 0837   MCH 30.4 12/04/2023 0516   MCHC 32.6 12/04/2023 0516   RDW 13.2 12/04/2023 0516   RDW 13.2 09/14/2019 0837   LYMPHSABS 2.5 12/04/2023 0516   MONOABS 1.6 (H) 12/04/2023 0516   EOSABS 0.0 12/04/2023 0516   BASOSABS 0.0 12/04/2023 0516     Chest imaging: CT chest performed on 11/26/2023. Evidence of apical scarring at the right lung appears stable compared to CT chest performed in 2020. There is no evidence of interstitial lung disease.   PFT:    Latest Ref Rng & Units 04/19/2019    1:47 PM  PFT Results  FVC-Pre L 1.53   FVC-Predicted Pre %  69   FVC-Post L 1.57   FVC-Predicted Post % 71   Pre FEV1/FVC % % 66   Post FEV1/FCV % % 69   FEV1-Pre L 1.01   FEV1-Predicted Pre % 62   FEV1-Post L 1.08   DLCO uncorrected ml/min/mmHg 9.57   DLCO UNC% % 55   DLVA Predicted % 86   TLC L 3.87   TLC % Predicted % 81   RV % Predicted % 101    Patient's PFTs show evidence of obstruction with a reduced FEV1/FVC ratio.  She has a preserved TLC and a reduced DLCO.   Labs: On review of her laboratory workup she has had elevated eosinophils in the past but nothing of recently 2025. On 11/26/2023 she was noted to have 300 eos but with sepsis and  steroids that he was dropped to 0.   Echo: Complete echocardiogram 2022 normal LVEF.  Normal RV systolic function and volume.  Left ventricular diastolic parameters consistent with diastolic dysfunction       Assessment & Plan:   Assessment & Plan Mild intermittent asthma without complication 89 year old with history of asthma, previously on Symbicort  and albuterol .  No longer tolerating inhalers.  Switch to nebulizer treatments with Pulmicort  and albuterol  as needed.  She has been doing much better on nebulized treatments.  However had some regression due to volume overload and she is currently on Lasix .  Given needed albuterol  throughout the day will add Brovana  nebs twice a day.  She will follow-up in the clinic in 6 months.  No further intervention needed. Orders:   arformoterol  (BROVANA ) 15 MCG/2ML NEBU; Take 2 mLs (15 mcg total) by nebulization 2 (two) times daily.  Apical lung scarring       Zola Herter, MD [facility] Office: [phone]

## 2024-02-17 NOTE — Progress Notes (Signed)
 Cardiology Office Note:  .   Date:  02/18/2024  ID:  Hannah [name], DOB 03/14/1933, MRN [id number] PCP: Sun, Vyvyan, MD  [facility] Providers Cardiologist:  None   History of Present Illness: .   Hannah [name] is a 89 y.o. Asian [place] female with mild dementia, h/o buccal cancer in remission, type 2 DM, depression, chronic dyspnea due to reactive airway disease and bronchial asthma.  Cardiac workup in 2022 revealed echocardiogram with normal LF, grade II diastolic dysfunction, no ischemia/infarct on stress test. High resolution CT had revealed no pulmonary abnormality (H/O TB), but revealed aortic atherosclerosis and LAD calcification on 04/30/2019. Right and left heart catheterization on 09/18/2019 and found to have moderately elevated LVEDP and normal coronary arteries and normal right heart pressures.    Admitted to the hospital in July 2025 with sepsis and colitis.  She had negative workup with CT chest for PE on 11/26/2023.  She had mild abdominal and aortic atherosclerosis but no coronary calcification.  Cardiac Studies relevent.    Left and Right Heart Catheterization 09/18/19:   Normal right heart pressure. Elevated LVEDP.    ECHOCARDIOGRAM COMPLETE 03/01/2021  1. Left ventricular ejection fraction, by estimation, is 55 to 60%. The left ventricle has normal function. The left ventricle has no regional wall motion abnormalities. Left ventricular diastolic parameters are consistent with Grade I diastolic dysfunction (impaired relaxation). 2. Right ventricular systolic function is normal. The right ventricular size is normal. Tricuspid regurgitation signal is inadequate for assessing PA pressure.    Discussed the use of AI scribe software for clinical note transcription with the patient, who gave verbal consent to proceed.  History of Present Illness Hannah [name] is an 89 year old female with chronic diastolic heart failure and restrictive lung disease who presents with  worsening leg swelling and breathing difficulties.  She experiences worsening bilateral leg and foot swelling, which began during a recent hospitalization. The swelling is most pronounced in the ankles and worsens throughout the day despite elevation. She requires larger shoes due to the swelling.  She has breathing difficulties, using a nebulizer due to restricted mouth opening from oral cancer. Symptoms include wheezing and shortness of breath. Her history of tuberculosis may have contributed to lung scarring.  Her appetite is decreased, but she has gained weight from 86 to 101 pounds since hospitalization, likely due to fluid retention. Her blood pressure is generally well-controlled, though slightly elevated at the clinic.   Labs   Lab Results  Component Value Date   NA 141 02/17/2024   K 5.3 (H) 02/17/2024   CO2 23 02/17/2024   GLUCOSE 157 (H) 02/17/2024   BUN 15 02/17/2024   CREATININE 1.09 (H) 02/17/2024   CALCIUM  10.2 02/17/2024   EGFR 49 (L) 02/17/2024   GFRNONAA >60 12/04/2023    Recent Labs    12/02/23 0442 12/03/23 0544 12/04/23 0516 02/17/24 0934  NA 140 138 136 141  K 3.1* 3.9 4.0 5.3*  CL 103 99 101 101  CO2 26 28 24 23   GLUCOSE 91 59* 110* 157*  BUN 17 20 22 15   CREATININE 0.81 0.81 0.77 1.09*  CALCIUM  8.7* 9.4 9.2 10.2  GFRNONAA >60 >60 >60  --     Lab Results  Component Value Date   ALT 12 11/28/2023   AST 15 11/28/2023   ALKPHOS 27 (L) 11/28/2023   BILITOT 0.5 11/28/2023      Latest Ref Rng & Units 12/04/2023    5:16 AM  12/03/2023    5:44 AM 12/02/2023    4:42 AM  CBC  WBC 4.0 - 10.5 K/uL 21.8  23.3  20.8   Hemoglobin 12.0 - 15.0 g/dL 88.1  87.9  89.1   Hematocrit 36.0 - 46.0 % 36.2  37.9  33.2   Platelets 150 - 400 K/uL 322  358  307    Lab Results  Component Value Date   HGBA1C 10.2 (H) 11/27/2023    Lab Results  Component Value Date   TSH 0.632 11/28/2023    BNP (last 3 results) No results for input(s): BNP in the last 8760  hours.  ProBNP (last 3 results) Recent Labs    02/17/24 0934  PROBNP 308   ROS  Review of Systems  Cardiovascular:  Positive for dyspnea on exertion and leg swelling (bilateral ankles). Negative for chest pain.  Respiratory:  Positive for wheezing.    Physical Exam:   VS:  BP 120/68 (BP Location: Left Arm, Patient Position: Sitting, Cuff Size: Normal)   Pulse (!) 112   Resp 16   Ht 5' 1 (1.549 m)   Wt 101 lb 12.8 oz (46.2 kg)   SpO2 97%   BMI 19.23 kg/m    Wt Readings from Last 3 Encounters:  02/17/24 101 lb 12.8 oz (46.2 kg)  02/17/24 101 lb 12.8 oz (46.2 kg)  02/10/24 97 lb (44 kg)    BP Readings from Last 3 Encounters:  02/17/24 112/70  02/17/24 120/68  02/10/24 109/74   Physical Exam Neck:     Vascular: No carotid bruit or JVD.  Cardiovascular:     Rate and Rhythm: Normal rate and regular rhythm.     Pulses: Intact distal pulses.     Heart sounds: Normal heart sounds. No murmur heard.    No gallop.  Pulmonary:     Effort: Pulmonary effort is normal.     Breath sounds: Normal breath sounds.  Abdominal:     General: Bowel sounds are normal. There is distension (mild).     Palpations: Abdomen is soft.  Musculoskeletal:     Right lower leg: Edema (2+ ankle pitting) present.     Left lower leg: Edema (2+ ankle pitting) present.    EKG:         ASSESSMENT AND PLAN: .      ICD-10-CM   1. Dyspnea on exertion  R06.09     2. Chronic obstructive asthma (HCC)  J44.89     3. Acute on chronic heart failure with preserved ejection fraction (HFpEF) (HCC)  I50.33 Finerenone (KERENDIA) 10 MG TABS    furosemide  (LASIX ) 20 MG tablet    Pro b natriuretic peptide (BNP)    Pro b natriuretic peptide (BNP)    Pro b natriuretic peptide (BNP)    Pro b natriuretic peptide (BNP)    4. Medication management  Z79.899 Basic metabolic panel with GFR    Basic metabolic panel with GFR      Assessment and Plan Assessment & Plan Chronic diastolic heart failure with fluid  overload and lower extremity and abdominal edema Chronic diastolic heart failure with fluid overload, presenting with worsening edema in both lower extremities and abdominal region. The heart failure is due to a stiff heart that does not relax well, compounded by restrictive lung disease. There is a 12-13 pound weight gain since hospitalization, indicating significant fluid retention. Concerns about using diuretics due to overactive bladder and incontinence. - Prescribe Lasix  20 mg once daily in the morning  for 4-5 days to reduce fluid overload, then use as needed based on weight and fluid status. - Prescribe Leonore to manage heart failure and fluid retention long-term, pending prior authorization. - Order ProBNP blood test to assess fluid status. - Monitor kidney function and potassium levels during diuretic therapy. - Although NT proBNP today is not elevated, clinically she is volume overloaded with abdominal distention and bilateral leg edema and dyspnea. - BMP reviewed, acute renal insufficiency probably due to cardiorenal syndrome.  Will continue to monitor with repeat BMP in 2 to 3 weeks.  Restrictive and reactive airway disease with history of pulmonary tuberculosis and residual lung scarring Restrictive and reactive airway disease with pulmonary tuberculosis leading to lung scarring. The condition is characterized by bronchospasm and reduced lung expansion due to calcified cartilage. The condition contributes to shortness of breath and is exacerbated by fluid overload. - Advise steam inhalation to help liquefy and clear respiratory secretions.  Type 2 diabetes mellitus Type 2 diabetes mellitus with consideration of using SGLT2 inhibitors like Jardiance or Farxiga for fluid management. However, concerns about potential UTIs and yeast infections due to incontinence and diabetes. The potential benefits include fluid reduction and kidney protection, but the risk of groin infections and UTIs is  significant.  Follow-Up Follow-up plan to monitor response to treatment and adjust as necessary. - Schedule follow-up appointment with a PA or nurse practitioner in 3 weeks to assess response to treatment and fluid status. - Plan for a follow-up visit with the cardiologist in 3 months.   Signed,  Gordy Bergamo, MD, [facility] 02/18/2024, 6:56 AM [facility] [address] [place], [place] [address] Phone: [phone]. Fax:  [phone]

## 2024-02-17 NOTE — Patient Instructions (Signed)
 It was nice seeing you again in the clinic  Please start taking Brovana  through your nebulizer twice a day  Continue pulmicort  and albuterol  as instructed   We will see you back in the clinic in 3 months

## 2024-02-17 NOTE — Patient Instructions (Signed)
 Medication Instructions:  Your physician has recommended you make the following change in your medication:   ** Begin Kerendia 10mg  - 1 tablet by mouth daily  ** Furosemide  20mg  - 1 tablet by mouth each morning x 5 days then you will only take as needed for weight gain greater than 3 pounds or leg swelling.  *If you need a refill on your cardiac medications before your next appointment, please call your pharmacy*  Lab Work: ** Pro BNP today  ** Pro BNP and BMP in 2 weeks If you have labs (blood work) drawn today and your tests are completely normal, you will receive your results only by: MyChart Message (if you have MyChart) OR A paper copy in the mail If you have any lab test that is abnormal or we need to change your treatment, we will call you to review the results.  Testing/Procedures: None ordered.   Follow-Up: At [facility], you and your health needs are our priority.  As part of our continuing mission to provide you with exceptional heart care, our providers are all part of one team.  This team includes your primary Cardiologist (physician) and Advanced Practice Providers or APPs (Physician Assistants and Nurse Practitioners) who all work together to provide you with the care you need, when you need it.  Your next appointment:   3 weeks with APP  3 months with Dr Ladona

## 2024-02-17 NOTE — Telephone Encounter (Addendum)
 Pharmacy Patient Advocate Encounter   Received notification from [facility] that prior authorization for kerendia 10mg  is required/requested.   Insurance verification completed.   The patient is insured through [employer].   Per test claim: PA required; PA submitted to above mentioned insurance via Latent Key/confirmation #/[facility] Status is pending

## 2024-02-18 ENCOUNTER — Ambulatory Visit: Payer: Self-pay | Admitting: Cardiology

## 2024-02-18 LAB — BASIC METABOLIC PANEL WITH GFR
BUN/Creatinine Ratio: 14 (ref 12–28)
BUN: 15 mg/dL (ref 8–27)
CO2: 23 mmol/L (ref 20–29)
Calcium: 10.2 mg/dL (ref 8.7–10.3)
Chloride: 101 mmol/L (ref 96–106)
Creatinine, Ser: 1.09 mg/dL — ABNORMAL HIGH (ref 0.57–1.00)
Glucose: 157 mg/dL — ABNORMAL HIGH (ref 70–99)
Potassium: 5.3 mmol/L — ABNORMAL HIGH (ref 3.5–5.2)
Sodium: 141 mmol/L (ref 134–144)
eGFR: 49 mL/min/1.73 — ABNORMAL LOW (ref >59)

## 2024-02-18 LAB — PRO B NATRIURETIC PEPTIDE: NT-Pro BNP: 308 pg/mL (ref 0–738)

## 2024-02-18 NOTE — Progress Notes (Signed)
 Mild kidney dysfunction, will keep an eye. NT pro BNP does not suggest heart failure, but clinically she looked volume loaded. So no change in plans and continue what we discussed

## 2024-02-20 ENCOUNTER — Ambulatory Visit (HOSPITAL_COMMUNITY)
Admission: RE | Admit: 2024-02-20 | Discharge: 2024-02-20 | Disposition: A | Discharge status: Home / Self Care | Source: Ambulatory Visit | Attending: Otolaryngology | Admitting: Otolaryngology

## 2024-02-20 DIAGNOSIS — D333 Benign neoplasm of cranial nerves: Secondary | ICD-10-CM | POA: Insufficient documentation

## 2024-02-20 DIAGNOSIS — E1165 Type 2 diabetes mellitus with hyperglycemia: Secondary | ICD-10-CM | POA: Diagnosis not present

## 2024-02-20 DIAGNOSIS — I6782 Cerebral ischemia: Secondary | ICD-10-CM | POA: Diagnosis not present

## 2024-02-20 DIAGNOSIS — G319 Degenerative disease of nervous system, unspecified: Secondary | ICD-10-CM | POA: Diagnosis not present

## 2024-02-20 MED ORDER — GADOBUTROL 1 MMOL/ML IV SOLN
5.0000 mL | Freq: Once | INTRAVENOUS | Status: AC | PRN
Start: 2024-02-20 — End: 2024-02-20
  Administered 2024-02-20: 5 mL via INTRAVENOUS

## 2024-02-21 DIAGNOSIS — R54 Age-related physical debility: Secondary | ICD-10-CM | POA: Diagnosis not present

## 2024-02-24 DIAGNOSIS — N3281 Overactive bladder: Secondary | ICD-10-CM | POA: Diagnosis not present

## 2024-02-24 DIAGNOSIS — R350 Frequency of micturition: Secondary | ICD-10-CM | POA: Diagnosis not present

## 2024-03-02 DIAGNOSIS — J4489 Other specified chronic obstructive pulmonary disease: Secondary | ICD-10-CM | POA: Diagnosis not present

## 2024-03-02 DIAGNOSIS — R0609 Other forms of dyspnea: Secondary | ICD-10-CM | POA: Diagnosis not present

## 2024-03-02 DIAGNOSIS — I5033 Acute on chronic diastolic (congestive) heart failure: Secondary | ICD-10-CM | POA: Diagnosis not present

## 2024-03-02 DIAGNOSIS — Z79899 Other long term (current) drug therapy: Secondary | ICD-10-CM | POA: Diagnosis not present

## 2024-03-02 LAB — BASIC METABOLIC PANEL WITH GFR
BUN/Creatinine Ratio: 13 (ref 12–28)
BUN: 14 mg/dL (ref 8–27)
CO2: 21 mmol/L (ref 20–29)
Calcium: 10.4 mg/dL — ABNORMAL HIGH (ref 8.7–10.3)
Chloride: 102 mmol/L (ref 96–106)
Creatinine, Ser: 1.06 mg/dL — ABNORMAL HIGH (ref 0.57–1.00)
Glucose: 56 mg/dL — ABNORMAL LOW (ref 70–99)
Potassium: 4.6 mmol/L (ref 3.5–5.2)
Sodium: 140 mmol/L (ref 134–144)
eGFR: 50 mL/min/1.73 — ABNORMAL LOW (ref >59)

## 2024-03-02 LAB — PRO B NATRIURETIC PEPTIDE: NT-Pro BNP: 237 pg/mL (ref 0–738)

## 2024-03-03 NOTE — Progress Notes (Signed)
 Very stable labs and no change in kidney function and Pro BNP is reduced suggesting no heart failure. Continue present meds

## 2024-03-05 NOTE — Progress Notes (Signed)
 " Cardiology Office Note   Date:  03/09/2024  ID:  IVYONNA HOELZEL, DOB 05-02-1933, MRN [id number] PCP: Sun, Vyvyan, MD  [facility] Providers Cardiologist:  Gordy Bergamo, MD     History of Present Illness Hannah [name] is a 89 y.o. female with a past medical history of HFpEF, hypertension, asthma, COPD, mild to moderate MR, hypothyroidism, insulin -dependent diabetes type 2, CKD stage IIIa.  03/01/2021 echo EF 55 to 60%, grade 1 DD, mild thickening of aortic valve 09/18/2019 right left heart cath normal coronary arteries, DOE felt to be related to diastolic dysfunction with elevated LVEDP 10/29/2018 echo EF 65%, grade 2 DD, mild to moderate MR 08/07/2018 MPI normal, low risk  Long-standing patient of Dr. Bergamo, establishing care with him in 2020 for the evaluation of shortness of breath at the behest of her PCP.  An echocardiogram revealed EF 65%, grade 2 DD, mild to moderate MR; Lexiscan  was abnormal, low risk study.  In 2021 she underwent a cardiac catheterization revealing normal coronary arteries, LVEDP was moderately elevated at 20 mmHg and this was felt to be the source of her shortness of breath.  Most recently she was evaluated by Dr. Bergamo on 02/17/2024, she was dealing with worsening pedal edema of the head started after a hospitalization in July for sepsis secondary to colitis, she was volume overloaded and she was started on lasix  and Kerendia , plans to follow up in 3 weeks.   She presents today for follow-up accompanied by 2 of her daughters, she is doing okay, no formal complaints but she is bothered by pedal edema.  They are not able to weigh her daily, but have been tracking it between different office visits and it appears her weight is overall stable and slightly decreased.  She has some trace pedal edema more on the right than on the left.  She denies chest pain, palpitations, dyspnea, pnd, orthopnea, n, v, dizziness, syncope, edema, weight gain, or early satiety.    ROS:  Review of Systems  Cardiovascular:  Positive for leg swelling.  All other systems reviewed and are negative.    Studies Reviewed      Cardiac Studies & Procedures   ______________________________________________________________________________________________ CARDIAC CATHETERIZATION  CARDIAC CATHETERIZATION 09/18/2019  Conclusion  LV end diastolic pressure is moderately elevated.  Left and Right Heart Catheterization 09/18/19: LVEDP moderately elevated, 20 mmHg.  LV gram not performed to conserve contrast. Normal coronary arteries right dominant circulation.  Small coronary arteries with brisk flow without evidence of atherosclerosis. Normal right heart catheterization.  RA 6/5, mean 4; RV 22/-1, EDP 2; PA 23/1, mean 13, PA saturation 68%.;  PW 8/10, mean 8 mmHg.  Aortic saturation 1/2%. Cardiac output 3.57, cardiac index 2.59, QP/QS 1.0, normal.  Impression: Dyspnea on exertion is related to diastolic dysfunction with elevated LVEDP.  We will start the patient on gentle diuretics.  She would benefit from being on ACE inhibitor's however has underlying hyperkalemia.  We will try to control her blood pressure aggressively, amlodipine  will be another choice.  She will be discharged home with outpatient follow-up, 20 mL contrast utilized.  Findings Coronary Findings Diagnostic  Dominance: Right  Left Anterior Descending Vessel is small. Vessel is angiographically normal.  Left Circumflex Vessel is small. Vessel is angiographically normal.  Right Coronary Artery Vessel is small. Vessel is angiographically normal.  Intervention  No interventions have been documented.   STRESS TESTS  PCV MYOCARDIAL PERFUSION WITH LEXISCAN  08/07/2018  Narrative Lexiscan  myoview  stress test 08/07/2018: 1.  Lexiscan  stress test was performed. Exercise capacity was not assessed. Stress symptoms included dizziness and headache. Resting blood pressure was 112/60 mmHg and peak effect blood pressure  was 120/62 mmHg. The resting and stress electrocardiogram demonstrated normal sinus rhythm, normal conduction, no arrhythmias and normal repolarization. Stress EKG is non diagnostic for ischemia as it is a pharmacologic stress. 2. The overall quality of the study is good. There is no evidence of abnormal lung activity. Stress and rest SPECT images demonstrate homogeneous tracer distribution throughout the myocardium. Gated SPECT imaging reveals normal myocardial thickening and wall motion. The left ventricular ejection fraction was normal (62%). 3. Low risk study.   ECHOCARDIOGRAM  ECHOCARDIOGRAM COMPLETE 03/01/2021  Narrative ECHOCARDIOGRAM REPORT    Patient Name:   Hannah [name] Date of Exam: 03/01/2021 Medical Rec #:  [id number]       Height:       62.0 in Accession #:    [id number]      Weight:       88.0 lb Date of Birth:  05/17/1933      BSA:          1.348 m Patient Age:    87 years        BP:           132/70 mmHg Patient Gender: F               HR:           62 bpm. Exam Location:  Inpatient  Procedure: 2D Echo, Cardiac Doppler and Color Doppler  Indications:     Abnormal ECG R94.31  History:         Patient has no prior history of Echocardiogram examinations.  Sonographer:     Vernell Hammersmith RDCS Referring Phys:  GHERGHE, COSTIN M Diagnosing Phys: Darryle Decent MD  IMPRESSIONS   1. Left ventricular ejection fraction, by estimation, is 55 to 60%. The left ventricle has normal function. The left ventricle has no regional wall motion abnormalities. Left ventricular diastolic parameters are consistent with Grade I diastolic dysfunction (impaired relaxation). 2. Right ventricular systolic function is normal. The right ventricular size is normal. Tricuspid regurgitation signal is inadequate for assessing PA pressure. 3. The mitral valve is grossly normal. No evidence of mitral valve regurgitation. No evidence of mitral stenosis. 4. The aortic valve is tricuspid. There is mild  thickening of the aortic valve. Aortic valve regurgitation is not visualized. No aortic stenosis is present. 5. The inferior vena cava is normal in size with greater than 50% respiratory variability, suggesting right atrial pressure of 3 mmHg.  FINDINGS Left Ventricle: Left ventricular ejection fraction, by estimation, is 55 to 60%. The left ventricle has normal function. The left ventricle has no regional wall motion abnormalities. The left ventricular internal cavity size was normal in size. There is no left ventricular hypertrophy. Left ventricular diastolic parameters are consistent with Grade I diastolic dysfunction (impaired relaxation).  Right Ventricle: The right ventricular size is normal. No increase in right ventricular wall thickness. Right ventricular systolic function is normal. Tricuspid regurgitation signal is inadequate for assessing PA pressure.  Left Atrium: Left atrial size was normal in size.  Right Atrium: Right atrial size was normal in size.  Pericardium: There is no evidence of pericardial effusion. Presence of pericardial fat pad.  Mitral Valve: The mitral valve is grossly normal. No evidence of mitral valve regurgitation. No evidence of mitral valve stenosis.  Tricuspid Valve: The tricuspid valve is grossly normal. Tricuspid  valve regurgitation is trivial. No evidence of tricuspid stenosis.  Aortic Valve: The aortic valve is tricuspid. There is mild thickening of the aortic valve. Aortic valve regurgitation is not visualized. No aortic stenosis is present. Aortic valve mean gradient measures 4.0 mmHg. Aortic valve peak gradient measures 7.2 mmHg. Aortic valve area, by VTI measures 1.55 cm.  Pulmonic Valve: The pulmonic valve was grossly normal. Pulmonic valve regurgitation is not visualized. No evidence of pulmonic stenosis.  Aorta: The aortic root is normal in size and structure.  Venous: The inferior vena cava is normal in size with greater than 50% respiratory  variability, suggesting right atrial pressure of 3 mmHg.  IAS/Shunts: The atrial septum is grossly normal.   LEFT VENTRICLE PLAX 2D LVIDd:         3.50 cm   Diastology LVIDs:         2.30 cm   LV e' medial:    6.53 cm/s LV PW:         1.00 cm   LV E/e' medial:  11.5 LV IVS:        0.90 cm   LV e' lateral:   6.09 cm/s LVOT diam:     1.80 cm   LV E/e' lateral: 12.3 LV SV:         41 LV SV Index:   30 LVOT Area:     2.54 cm   RIGHT VENTRICLE RV Basal diam:  3.40 cm  LEFT ATRIUM             Index        RIGHT ATRIUM           Index LA diam:        2.60 cm 1.93 cm/m   RA Area:     10.60 cm LA Vol (A2C):   25.7 ml 19.06 ml/m  RA Volume:   21.90 ml  16.24 ml/m LA Vol (A4C):   33.7 ml 25.00 ml/m LA Biplane Vol: 29.4 ml 21.81 ml/m AORTIC VALVE AV Area (Vmax):    1.46 cm AV Area (Vmean):   1.35 cm AV Area (VTI):     1.55 cm AV Vmax:           134.00 cm/s AV Vmean:          98.100 cm/s AV VTI:            0.264 m AV Peak Grad:      7.2 mmHg AV Mean Grad:      4.0 mmHg LVOT Vmax:         77.00 cm/s LVOT Vmean:        52.200 cm/s LVOT VTI:          0.161 m LVOT/AV VTI ratio: 0.61  AORTA Ao Root diam: 2.70 cm  MITRAL VALVE MV Area (PHT): 4.89 cm    SHUNTS MV Decel Time: 155 msec    Systemic VTI:  0.16 m MV E velocity: 75.00 cm/s  Systemic Diam: 1.80 cm MV A velocity: 84.80 cm/s MV E/A ratio:  0.88  Darryle Decent MD Electronically signed by Darryle Decent MD Signature Date/Time: 03/01/2021/2:11:01 PM    Final (Updated)          ______________________________________________________________________________________________      Risk Assessment/Calculations           Physical Exam VS:  BP 134/78   Pulse 88   Ht 5' 1 (1.549 m)   Wt 99 lb (44.9 kg)   SpO2 99%  BMI 18.71 kg/m        Wt Readings from Last 3 Encounters:  03/09/24 99 lb (44.9 kg)  02/17/24 101 lb 12.8 oz (46.2 kg)  02/17/24 101 lb 12.8 oz (46.2 kg)    GEN: Appears younger than  stated age, well nourished, well developed in no acute distress NECK: No JVD; No carotid bruits CARDIAC: RRR, no murmurs, rubs, gallops RESPIRATORY:  Clear to auscultation without rales, wheezing or rhonchi  ABDOMEN: Soft, non-tender, non-distended EXTREMITIES: +1 pedal edema right lower ankle, trace in the left; No deformity   ASSESSMENT AND PLAN HFpEF -NYHA class I-II for fatigue, she appears to be euvolemic with some trace pedal edema. Weight is down 2 lbs. Continue metoprolol  tartrate 25 mg twice daily, kerendia  10 mg daily, lasix  currently 20 mg once week >> she can increase to twice or three times/week based on shortness of breath, orthopnea or pedal edema.  Repeat lab work last week revealed potassium 4.6, creatinine 1.06 after starting Kerendia .  Will repeat echocardiogram.  Hypertension-blood pressure is controlled 134/78, continue Norvasc  2.5 mg daily, metoprolol  25 mg twice daily.  Dyslipidemia-lipids appear to be followed by her PCP, currently on Vytorin  10-20 mg.       Dispo: May increase Lasix  to 2-3 times a week, repeat echocardiogram, keep follow-up with Dr. Ladona.  Signed, Delon JAYSON Hoover, NP  "

## 2024-03-09 ENCOUNTER — Ambulatory Visit: Attending: Cardiology | Admitting: Cardiology

## 2024-03-09 ENCOUNTER — Encounter: Payer: Self-pay | Admitting: Cardiology

## 2024-03-09 VITALS — BP 134/78 | HR 88 | Ht 61.0 in | Wt 99.0 lb

## 2024-03-09 DIAGNOSIS — I5032 Chronic diastolic (congestive) heart failure: Secondary | ICD-10-CM | POA: Diagnosis not present

## 2024-03-09 DIAGNOSIS — R0609 Other forms of dyspnea: Secondary | ICD-10-CM

## 2024-03-09 DIAGNOSIS — I10 Essential (primary) hypertension: Secondary | ICD-10-CM | POA: Diagnosis not present

## 2024-03-09 DIAGNOSIS — I5030 Unspecified diastolic (congestive) heart failure: Secondary | ICD-10-CM

## 2024-03-09 DIAGNOSIS — I340 Nonrheumatic mitral (valve) insufficiency: Secondary | ICD-10-CM | POA: Diagnosis not present

## 2024-03-09 DIAGNOSIS — E1165 Type 2 diabetes mellitus with hyperglycemia: Secondary | ICD-10-CM | POA: Diagnosis not present

## 2024-03-09 MED ORDER — FUROSEMIDE 20 MG PO TABS: ORAL_TABLET | ORAL | 1 refills | Status: DC

## 2024-03-09 MED ORDER — KERENDIA 10 MG PO TABS: 1.0000 | ORAL_TABLET | Freq: Every day | ORAL | 2 refills | Status: DC

## 2024-03-09 NOTE — Patient Instructions (Signed)
 Medication Instructions:   START TAKING  : LASIX   20 MG  2 TO 3 TIMES A  WEEK   *If you need a refill on your cardiac medications before your next appointment, please call your pharmacy*  Lab Work: NONE ORDERED  TODAY   If you have labs (blood work) drawn today and your tests are completely normal, you will receive your results only by: MyChart Message (if you have MyChart) OR A paper copy in the mail If you have any lab test that is abnormal or we need to change your treatment, we will call you to review the results.  Testing/Procedures:  Your physician has requested that you have an echocardiogram. Echocardiography is a painless test that uses sound waves to create images of your heart. It provides your doctor with information about the size and shape of your heart and how well your heart's chambers and valves are working. This procedure takes approximately one hour. There are no restrictions for this procedure. Please do NOT wear cologne, perfume, aftershave, or lotions (deodorant is allowed). Please arrive 15 minutes prior to your appointment time.  Please note: We ask at that you not bring children with you during ultrasound (echo/ vascular) testing. Due to room size and safety concerns, children are not allowed in the ultrasound rooms during exams. Our front office staff cannot provide observation of children in our lobby area while testing is being conducted. An adult accompanying a patient to their appointment will only be allowed in the ultrasound room at the discretion of the ultrasound technician under special circumstances. We apologize for any inconvenience.    Follow-Up: At [facility], you and your health needs are our priority.  As part of our continuing mission to provide you with exceptional heart care, our providers are all part of one team.  This team includes your primary Cardiologist (physician) and Advanced Practice Providers or APPs (Physician Assistants and  Nurse Practitioners) who all work together to provide you with the care you need, when you need it.  Your next appointment:   AS SCHEDULE WITH DR MARGARETANN    We recommend signing up for the patient portal called MyChart.  Sign up information is provided on this After Visit Summary.  MyChart is used to connect with patients for Virtual Visits (Telemedicine).  Patients are able to view lab/test results, encounter notes, upcoming appointments, etc.  Non-urgent messages can be sent to your provider as well.   To learn more about what you can do with MyChart, go to [email or web address].   Other Instructions

## 2024-04-09 ENCOUNTER — Other Ambulatory Visit: Payer: Self-pay

## 2024-04-09 ENCOUNTER — Emergency Department (HOSPITAL_BASED_OUTPATIENT_CLINIC_OR_DEPARTMENT_OTHER)

## 2024-04-09 ENCOUNTER — Encounter (HOSPITAL_BASED_OUTPATIENT_CLINIC_OR_DEPARTMENT_OTHER): Payer: Self-pay

## 2024-04-09 ENCOUNTER — Emergency Department (HOSPITAL_BASED_OUTPATIENT_CLINIC_OR_DEPARTMENT_OTHER)
Admission: EM | Admit: 2024-04-09 | Discharge: 2024-04-09 | Disposition: A | Discharge status: Home / Self Care | Attending: Emergency Medicine | Admitting: Emergency Medicine

## 2024-04-09 DIAGNOSIS — Z794 Long term (current) use of insulin: Secondary | ICD-10-CM | POA: Diagnosis not present

## 2024-04-09 DIAGNOSIS — J984 Other disorders of lung: Secondary | ICD-10-CM

## 2024-04-09 DIAGNOSIS — Z7984 Long term (current) use of oral hypoglycemic drugs: Secondary | ICD-10-CM | POA: Insufficient documentation

## 2024-04-09 DIAGNOSIS — J189 Pneumonia, unspecified organism: Secondary | ICD-10-CM | POA: Insufficient documentation

## 2024-04-09 DIAGNOSIS — R059 Cough, unspecified: Secondary | ICD-10-CM | POA: Diagnosis present

## 2024-04-09 DIAGNOSIS — J4489 Other specified chronic obstructive pulmonary disease: Secondary | ICD-10-CM | POA: Insufficient documentation

## 2024-04-09 DIAGNOSIS — I509 Heart failure, unspecified: Secondary | ICD-10-CM | POA: Insufficient documentation

## 2024-04-09 DIAGNOSIS — E119 Type 2 diabetes mellitus without complications: Secondary | ICD-10-CM | POA: Insufficient documentation

## 2024-04-09 DIAGNOSIS — J4520 Mild intermittent asthma, uncomplicated: Secondary | ICD-10-CM

## 2024-04-09 HISTORY — DX: COVID-19: U07.1

## 2024-04-09 LAB — CBC WITH DIFFERENTIAL/PLATELET
Abs Immature Granulocytes: 0.86 K/uL — ABNORMAL HIGH (ref 0.00–0.07)
Basophils Absolute: 0.1 K/uL (ref 0.0–0.1)
Basophils Relative: 1 %
Eosinophils Absolute: 0.1 K/uL (ref 0.0–0.5)
Eosinophils Relative: 0 %
HCT: 37.9 % (ref 36.0–46.0)
Hemoglobin: 12.4 g/dL (ref 12.0–15.0)
Immature Granulocytes: 5 %
Lymphocytes Relative: 20 %
Lymphs Abs: 3.6 K/uL (ref 0.7–4.0)
MCH: 29.1 pg (ref 26.0–34.0)
MCHC: 32.7 g/dL (ref 30.0–36.0)
MCV: 89 fL (ref 80.0–100.0)
Monocytes Absolute: 1.5 K/uL — ABNORMAL HIGH (ref 0.1–1.0)
Monocytes Relative: 8 %
Neutro Abs: 12.3 K/uL — ABNORMAL HIGH (ref 1.7–7.7)
Neutrophils Relative %: 66 %
Platelets: 243 K/uL (ref 150–400)
RBC: 4.26 MIL/uL (ref 3.87–5.11)
RDW: 16.1 % — ABNORMAL HIGH (ref 11.5–15.5)
WBC: 18.5 K/uL — ABNORMAL HIGH (ref 4.0–10.5)
nRBC: 0.1 % (ref 0.0–0.2)

## 2024-04-09 LAB — TROPONIN T, HIGH SENSITIVITY
Troponin T High Sensitivity: 54 ng/L — ABNORMAL HIGH (ref 0–19)
Troponin T High Sensitivity: 42 ng/L — ABNORMAL HIGH (ref 0–19)

## 2024-04-09 LAB — COMPREHENSIVE METABOLIC PANEL WITH GFR
ALT: 32 U/L (ref 0–44)
AST: 31 U/L (ref 15–41)
Albumin: 3.8 g/dL (ref 3.5–5.0)
Alkaline Phosphatase: 45 U/L (ref 38–126)
Anion gap: 13 (ref 5–15)
BUN: 15 mg/dL (ref 8–23)
CO2: 26 mmol/L (ref 22–32)
Calcium: 10.4 mg/dL — ABNORMAL HIGH (ref 8.9–10.3)
Chloride: 100 mmol/L (ref 98–111)
Creatinine, Ser: 1.26 mg/dL — ABNORMAL HIGH (ref 0.44–1.00)
GFR, Estimated: 41 mL/min — ABNORMAL LOW (ref >60)
Glucose, Bld: 181 mg/dL — ABNORMAL HIGH (ref 70–99)
Potassium: 4.3 mmol/L (ref 3.5–5.1)
Sodium: 139 mmol/L (ref 135–145)
Total Bilirubin: 0.3 mg/dL (ref 0.0–1.2)
Total Protein: 7.2 g/dL (ref 6.5–8.1)

## 2024-04-09 LAB — RESP PANEL BY RT-PCR (RSV, FLU A&B, COVID)  RVPGX2
Influenza A by PCR: NEGATIVE
Influenza B by PCR: NEGATIVE
Resp Syncytial Virus by PCR: NEGATIVE
SARS Coronavirus 2 by RT PCR: NEGATIVE

## 2024-04-09 LAB — PRO BRAIN NATRIURETIC PEPTIDE: Pro Brain Natriuretic Peptide: 268 pg/mL (ref <300.0)

## 2024-04-09 MED ORDER — DOXYCYCLINE HYCLATE 100 MG PO CAPS: 100.0000 mg | ORAL_CAPSULE | Freq: Two times a day (BID) | ORAL | 0 refills | Status: DC

## 2024-04-09 MED ORDER — ALBUTEROL SULFATE (2.5 MG/3ML) 0.083% IN NEBU
2.5000 mg | INHALATION_SOLUTION | Freq: Once | RESPIRATORY_TRACT | Status: AC
  Administered 2024-04-09: 2.5 mg via RESPIRATORY_TRACT
  Filled 2024-04-09: qty 3

## 2024-04-09 MED ORDER — METHYLPREDNISOLONE SODIUM SUCC 125 MG IJ SOLR
125.0000 mg | Freq: Once | INTRAMUSCULAR | Status: AC
  Administered 2024-04-09: 125 mg via INTRAVENOUS
  Filled 2024-04-09: qty 2

## 2024-04-09 MED ORDER — SODIUM CHLORIDE 0.9 % IV SOLN
100.0000 mg | Freq: Once | INTRAVENOUS | Status: AC
  Administered 2024-04-09: 100 mg via INTRAVENOUS
  Filled 2024-04-09: qty 100

## 2024-04-09 MED ORDER — PREDNISONE 10 MG PO TABS: 20.0000 mg | ORAL_TABLET | Freq: Every day | ORAL | 0 refills | Status: DC

## 2024-04-09 MED ORDER — ALBUTEROL SULFATE (2.5 MG/3ML) 0.083% IN NEBU: 2.5000 mg | INHALATION_SOLUTION | RESPIRATORY_TRACT | 5 refills | Status: AC | PRN

## 2024-04-09 MED ORDER — SODIUM CHLORIDE 0.9 % IV BOLUS
500.0000 mL | Freq: Once | INTRAVENOUS | Status: AC
  Administered 2024-04-09: 500 mL via INTRAVENOUS

## 2024-04-09 MED ORDER — IOHEXOL 350 MG/ML SOLN
75.0000 mL | Freq: Once | INTRAVENOUS | Status: AC | PRN
  Administered 2024-04-09: 76 mL via INTRAVENOUS

## 2024-04-09 MED ORDER — SODIUM CHLORIDE 0.9 % IV SOLN
1.0000 g | Freq: Once | INTRAVENOUS | Status: AC
  Administered 2024-04-09: 1 g via INTRAVENOUS
  Filled 2024-04-09: qty 10

## 2024-04-09 NOTE — Discharge Instructions (Signed)
 You have been seen and discharged from the emergency department.  You were diagnosed with pneumonia.  You were treated with breathing treatment, IV steroids and IV antibiotics.  Continue taking oral steroids and oral antibiotics as prescribed.  Stay well-hydrated.  Follow-up with your primary provider for further evaluation and further care. Take home medications as prescribed. If you have any worsening symptoms or further concerns for your health please return to an emergency department for further evaluation.

## 2024-04-09 NOTE — ED Triage Notes (Signed)
 SOB and cough since having Covid 3-4 weeks ago. Weakness and intermittent fevers. Taking 2 nebulizer treatments per day.

## 2024-04-09 NOTE — ED Provider Notes (Signed)
 [place] [facility] DEPARTMENT AT [facility] Provider Note   CSN: [id number] Arrival date & time: 04/09/24  9243     Patient presents with: Cough   Hannah [name] is a 89 y.o. female.   HPI   89 year old female with past medical history of asthma/COPD, CHF, diabetes presents emergency department accompanied by daughter with concern for ongoing productive cough, shortness of breath.  Patient was diagnosed with COVID about 4 weeks ago.  Has been taking medicine for symptomatic treatment and using nebulizers at home.  Presents today with ongoing increased mucus production, wet cough, extreme fatigue, chills.  Patient has also had a decrease in appetite without vomiting/diarrhea.  Prior to Admission medications   Medication Sig Start Date End Date Taking? Authorizing Provider  acetaminophen  (TYLENOL ) 325 MG tablet Take 650 mg by mouth every 6 (six) hours as needed for mild pain.     [provider]  albuterol  (PROVENTIL ) (2.5 MG/3ML) 0.083% nebulizer solution Take 3 mLs (2.5 mg total) by nebulization every 4 (four) hours as needed for wheezing or shortness of breath. 12/28/23 12/27/24  Hattar, Zola SAILOR, MD  albuterol  (VENTOLIN  HFA) 108 (90 Base) MCG/ACT inhaler Inhale 2 puffs into the lungs 3 (three) times daily for 3 days, THEN 2 puffs every 6 (six) hours as needed for wheezing or shortness of breath. 12/28/23 12/30/24  Hattar, Zola SAILOR, MD  amLODipine  (NORVASC ) 2.5 MG tablet Take 1 tablet (2.5 mg total) by mouth daily. 12/04/23   Sebastian Toribio GAILS, MD  arformoterol  (BROVANA ) 15 MCG/2ML NEBU Take 2 mLs (15 mcg total) by nebulization 2 (two) times daily. 02/17/24   Hattar, Zola SAILOR, MD  budesonide  (PULMICORT ) 0.5 MG/2ML nebulizer solution Take 2 mLs (0.5 mg total) by nebulization in the morning and at bedtime. 01/01/24   Hattar, Zola SAILOR, MD  Ca Carbonate-Mag Hydroxide 1000-200 MG CHEW Chew by mouth.    [provider]  CARBONYL IRON  PO Take 1 tablet by mouth daily.     [provider]  Cholecalciferol  (VITAMIN D -3) 125 MCG (5000 UT) TABS Take 5,000 Units by mouth daily.    [provider]  Coenzyme Q10 (CO Q 10 PO) Take 1 capsule by mouth daily.    [provider]  Continuous Glucose Sensor (FREESTYLE LIBRE 14 DAY SENSOR) MISC Inject 1 Device into the skin every 14 (fourteen) days. 11/18/23   [provider]  Cyanocobalamin  (B-12 SL) Place 1 tablet under the tongue daily.    [provider]  diclofenac  sodium (VOLTAREN ) 1 % GEL Apply 2 g topically 3 (three) times daily as needed (joint pain- affected areas).    [provider]  donepezil  (ARICEPT ) 5 MG tablet Take 5 mg by mouth at bedtime.    [provider]  ezetimibe -simvastatin  (VYTORIN ) 10-20 MG per tablet Take 1 tablet by mouth at bedtime.    [provider]  Finerenone  (KERENDIA ) 10 MG TABS Take 1 tablet (10 mg total) by mouth daily. 03/09/24   Carlin Delon BROCKS, NP  furosemide  (LASIX ) 20 MG tablet TAKE 2 TO 3 TIMES WEEKLY 03/09/24   Carlin Delon BROCKS, NP  HYDROcodone -acetaminophen  (NORCO/VICODIN) 5-325 MG tablet Take 1 tablet by mouth See admin instructions. Take 1 tablet by mouth with lunch and in the evening 10/24/23   [provider]  insulin  glargine (LANTUS  SOLOSTAR) 100 UNIT/ML Solostar Pen Inject 20 Units into the skin at bedtime. 12/04/23   Sebastian Toribio GAILS, MD  insulin  lispro (HUMALOG) 100 UNIT/ML KwikPen Inject 2-12 Units  into the skin See admin instructions. Inject 2-12 units into the skin three times a day with meals, per sliding scale    [provider]  JANUVIA 50 MG tablet Take 50 mg by mouth daily.    [provider]  latanoprost  (XALATAN ) 0.005 % ophthalmic solution Place 1 drop into both eyes at bedtime. 02/20/21   [provider]  levothyroxine  (SYNTHROID ) 50 MCG tablet Take 1 tablet (50 mcg total) by mouth See admin instructions. Take 25mcg alternating 50mcg every other day. 03/03/21    Gherghe, Costin M, MD  lidocaine  (XYLOCAINE ) 2 % solution Use as directed 3 mLs in the mouth or throat daily as needed for mouth pain. 04/28/18   [provider]  loratadine  (CLARITIN ) 10 MG tablet Take 1 tablet (10 mg total) by mouth daily. 12/04/23   Sebastian Toribio GAILS, MD  magnesium  oxide (MAG-OX) 400 (240 Mg) MG tablet Take 400 mg by mouth daily.    [provider]  megestrol  (MEGACE ) 400 MG/10ML suspension Take 600 mg by mouth daily before supper.    [provider]  metoprolol  tartrate (LOPRESSOR ) 25 MG tablet Take 1 tablet (25 mg total) by mouth 2 (two) times daily. 03/03/21   Gherghe, Costin M, MD  MYRBETRIQ  25 MG TB24 tablet Take 25 mg by mouth in the morning. 12/03/20   [provider]  omeprazole  (PRILOSEC) 20 MG capsule Take 1 capsule (20 mg total) by mouth every other day. 12/04/23   Sebastian Toribio GAILS, MD  ondansetron  (ZOFRAN ) 4 MG tablet Take 1 tablet (4 mg total) by mouth every 8 (eight) hours as needed for nausea or vomiting. 02/15/23   Jerrol Agent, MD  oxybutynin (DITROPAN-XL) 10 MG 24 hr tablet Take 10 mg by mouth daily. 02/12/24   [provider]  simethicone  (MYLICON) 80 MG chewable tablet Chew 1 tablet (80 mg total) by mouth every 6 (six) hours as needed for flatulence. 12/03/23   Sebastian Toribio GAILS, MD  Vibegron (GEMTESA) 75 MG TABS Take 75 mg by mouth daily.    [provider]    Allergies: Penicillins and Sulfa antibiotics    Review of Systems  Constitutional:  Positive for appetite change, chills and fatigue. Negative for fever.  Respiratory:  Positive for cough, chest tightness and shortness of breath.   Cardiovascular:  Negative for chest pain, palpitations and leg swelling.  Gastrointestinal:  Negative for abdominal pain, diarrhea and vomiting.  Skin:  Negative for rash.  Neurological:  Negative for headaches.    Updated Vital Signs BP (!) 127/54   Pulse 96   Temp 98.2 F (36.8 C) (Oral)   Resp 14   SpO2  98%   Physical Exam Vitals and nursing note reviewed.  Constitutional:      General: She is not in acute distress.    Appearance: Normal appearance.  HENT:     Head: Normocephalic.     Mouth/Throat:     Mouth: Mucous membranes are moist.  Cardiovascular:     Rate and Rhythm: Normal rate.  Pulmonary:     Effort: Pulmonary effort is normal. No respiratory distress.     Breath sounds: Wheezing and rales present.     Comments: Diminished at bases Abdominal:     Palpations: Abdomen is soft.     Tenderness: There is no abdominal tenderness.  Musculoskeletal:        General: No swelling.  Skin:    General: Skin is warm.  Neurological:     Mental  Status: She is alert and oriented to person, place, and time. Mental status is at baseline.  Psychiatric:        Mood and Affect: Mood normal.     (all labs ordered are listed, but only abnormal results are displayed) Labs Reviewed  CBC WITH DIFFERENTIAL/PLATELET - Abnormal; Notable for the following components:      Result Value   WBC 18.5 (*)    RDW 16.1 (*)    Neutro Abs 12.3 (*)    Monocytes Absolute 1.5 (*)    Abs Immature Granulocytes 0.86 (*)    All other components within normal limits  RESP PANEL BY RT-PCR (RSV, FLU A&B, COVID)  [id number]  COMPREHENSIVE METABOLIC PANEL WITH GFR  PRO BRAIN NATRIURETIC PEPTIDE  TROPONIN T, HIGH SENSITIVITY    EKG: EKG Interpretation Date/Time:  Monday April 09 2024 08:09:35 EST Ventricular Rate:  115 PR Interval:  156 QRS Duration:  81 QT Interval:  306 QTC Calculation: 424 R Axis:   88  Text Interpretation: Sinus tachycardia Atrial premature complex Anteroseptal infarct, age indeterminate Confirmed by Bari Flank ([id number]) on 04/09/2024 8:15:30 AM  Radiology: [facility] Chest Port 1 View Result Date: 04/09/2024 EXAM: 1 VIEW(S) XRAY OF THE CHEST 04/09/2024 09:19:08 AM COMPARISON: CT 11/30/2023. CLINICAL HISTORY: 89 year old female with cough. FINDINGS: LUNGS AND PLEURA: Chronic bilateral  lung base scarring demonstrated by CT 11/30/2023. No acute lung opacity. No pleural effusion. No pneumothorax. HEART AND MEDIASTINUM: No acute abnormality of the cardiac and mediastinal silhouettes. BONES AND SOFT TISSUES: No acute osseous abnormality. IMPRESSION: 1. No acute cardiopulmonary abnormality. Electronically signed by: Helayne Hurst MD 04/09/2024 09:31 AM EST RP Workstation: [id number]     Procedures   Medications Ordered in the ED  albuterol  (PROVENTIL ) (2.5 MG/3ML) 0.083% nebulizer solution 2.5 mg (2.5 mg Nebulization Given 04/09/24 0815)  methylPREDNISolone  sodium succinate (SOLU-MEDROL ) 125 mg/2 mL injection 125 mg (125 mg Intravenous Given 04/09/24 9076)                                    Medical Decision Making Amount and/or Complexity of Data Reviewed Labs: ordered. Radiology: ordered.  Risk Prescription drug management.   89 year old female presents emergency department with productive cough, diagnosed with COVID about a month ago.  Has been using over-the-counter medication without significant relief.  Accompanied by daughter who is main history giver.  Patient is tachycardic on arrival, afebrile, stable vitals otherwise.  Overall well-appearing but wet cough on exam.  Blood work shows a leukocytosis, mild dehydration.  BNP is negative, respiratory panel is negative.  Troponin is slightly above baseline but downtrending and flat.  EKG without any ischemic changes.  Most likely secondary, no active chest or back pain.  Chest x-ray did not note any active disease but CT angio shows findings of pneumonia.  No pulmonary embolus.  After medications the tachycardia has resolved, she has normal respirations and appears well.  Again no active chest pain or back pain, low suspicion for acute ACS.  Will plan for IV dose of antibiotics here for pneumonia plan for outpatient treatment and follow-up.  Patient at this time appears safe and stable for discharge and close outpatient  follow up. Discharge plan and strict return to ED precautions discussed, patient verbalizes understanding and agreement.     Final diagnoses:  None    ED Discharge Orders     None  Bari Roxie HERO, DO 04/09/24 [id number]

## 2024-04-30 ENCOUNTER — Ambulatory Visit (HOSPITAL_COMMUNITY)

## 2024-05-04 ENCOUNTER — Ambulatory Visit (HOSPITAL_COMMUNITY)
Admission: RE | Admit: 2024-05-04 | Discharge: 2024-05-04 | Disposition: A | Discharge status: Home / Self Care | Source: Ambulatory Visit | Attending: Cardiology | Admitting: Cardiology

## 2024-05-04 DIAGNOSIS — R0609 Other forms of dyspnea: Secondary | ICD-10-CM | POA: Diagnosis present

## 2024-05-04 DIAGNOSIS — I5032 Chronic diastolic (congestive) heart failure: Secondary | ICD-10-CM | POA: Insufficient documentation

## 2024-05-04 DIAGNOSIS — R0600 Dyspnea, unspecified: Secondary | ICD-10-CM | POA: Diagnosis not present

## 2024-05-04 DIAGNOSIS — I10 Essential (primary) hypertension: Secondary | ICD-10-CM | POA: Diagnosis not present

## 2024-05-05 LAB — ECHOCARDIOGRAM COMPLETE
Area-P 1/2: 4.57 cm2
MV M vel: 6.6 m/s
MV Peak grad: 174.2 mmHg
S' Lateral: 2.2 cm

## 2024-05-06 ENCOUNTER — Ambulatory Visit: Payer: Self-pay | Admitting: Cardiology

## 2024-05-09 NOTE — Progress Notes (Signed)
 Your echo is essentially normal with stable MR.   MR= Mitral regurgitation. (leakage on the left heart valve)  Similarly TR means tricuspid regurgitation (leakage on the right heart valve). Mild MR or TR are probably normal variants unless physicians feel it is abnormal. Reassure.   Hope you are feeling better with lasix . Try to wear support stockings immediately after you wake up within 60 min, helps to keep swelling down

## 2024-05-15 NOTE — Progress Notes (Signed)
 Yes use daily PRN and go to every other day when stable

## 2024-05-17 ENCOUNTER — Emergency Department (HOSPITAL_BASED_OUTPATIENT_CLINIC_OR_DEPARTMENT_OTHER)

## 2024-05-17 ENCOUNTER — Encounter (HOSPITAL_BASED_OUTPATIENT_CLINIC_OR_DEPARTMENT_OTHER): Payer: Self-pay | Admitting: *Deleted

## 2024-05-17 ENCOUNTER — Emergency Department (HOSPITAL_BASED_OUTPATIENT_CLINIC_OR_DEPARTMENT_OTHER): Admitting: Radiology

## 2024-05-17 ENCOUNTER — Other Ambulatory Visit: Payer: Self-pay

## 2024-05-17 ENCOUNTER — Inpatient Hospital Stay (HOSPITAL_BASED_OUTPATIENT_CLINIC_OR_DEPARTMENT_OTHER)
Admission: EM | Admit: 2024-05-17 | Discharge: 2024-06-01 | DRG: 871 | Disposition: A | Discharge status: Hospice | Attending: Internal Medicine | Admitting: Internal Medicine

## 2024-05-17 DIAGNOSIS — J4489 Other specified chronic obstructive pulmonary disease: Secondary | ICD-10-CM | POA: Diagnosis present

## 2024-05-17 DIAGNOSIS — T380X5A Adverse effect of glucocorticoids and synthetic analogues, initial encounter: Secondary | ICD-10-CM | POA: Diagnosis not present

## 2024-05-17 DIAGNOSIS — Z882 Allergy status to sulfonamides status: Secondary | ICD-10-CM

## 2024-05-17 DIAGNOSIS — E1165 Type 2 diabetes mellitus with hyperglycemia: Secondary | ICD-10-CM | POA: Diagnosis not present

## 2024-05-17 DIAGNOSIS — I4719 Other supraventricular tachycardia: Secondary | ICD-10-CM | POA: Diagnosis present

## 2024-05-17 DIAGNOSIS — B370 Candidal stomatitis: Secondary | ICD-10-CM | POA: Diagnosis not present

## 2024-05-17 DIAGNOSIS — E872 Acidosis, unspecified: Secondary | ICD-10-CM | POA: Diagnosis present

## 2024-05-17 DIAGNOSIS — Z7984 Long term (current) use of oral hypoglycemic drugs: Secondary | ICD-10-CM

## 2024-05-17 DIAGNOSIS — Z8616 Personal history of COVID-19: Secondary | ICD-10-CM

## 2024-05-17 DIAGNOSIS — A419 Sepsis, unspecified organism: Secondary | ICD-10-CM

## 2024-05-17 DIAGNOSIS — Z7952 Long term (current) use of systemic steroids: Secondary | ICD-10-CM

## 2024-05-17 DIAGNOSIS — J9601 Acute respiratory failure with hypoxia: Principal | ICD-10-CM | POA: Diagnosis present

## 2024-05-17 DIAGNOSIS — Z880 Allergy status to penicillin: Secondary | ICD-10-CM

## 2024-05-17 DIAGNOSIS — Z79899 Other long term (current) drug therapy: Secondary | ICD-10-CM

## 2024-05-17 DIAGNOSIS — J1008 Influenza due to other identified influenza virus with other specified pneumonia: Secondary | ICD-10-CM | POA: Diagnosis present

## 2024-05-17 DIAGNOSIS — I82402 Acute embolism and thrombosis of unspecified deep veins of left lower extremity: Secondary | ICD-10-CM | POA: Diagnosis present

## 2024-05-17 DIAGNOSIS — N179 Acute kidney failure, unspecified: Secondary | ICD-10-CM | POA: Diagnosis present

## 2024-05-17 DIAGNOSIS — Z66 Do not resuscitate: Secondary | ICD-10-CM | POA: Diagnosis not present

## 2024-05-17 DIAGNOSIS — Z515 Encounter for palliative care: Secondary | ICD-10-CM

## 2024-05-17 DIAGNOSIS — N390 Urinary tract infection, site not specified: Secondary | ICD-10-CM | POA: Diagnosis present

## 2024-05-17 DIAGNOSIS — G9341 Metabolic encephalopathy: Secondary | ICD-10-CM | POA: Diagnosis present

## 2024-05-17 DIAGNOSIS — J15211 Pneumonia due to Methicillin susceptible Staphylococcus aureus: Secondary | ICD-10-CM | POA: Diagnosis present

## 2024-05-17 DIAGNOSIS — Z79890 Hormone replacement therapy (postmenopausal): Secondary | ICD-10-CM

## 2024-05-17 DIAGNOSIS — D509 Iron deficiency anemia, unspecified: Secondary | ICD-10-CM | POA: Diagnosis present

## 2024-05-17 DIAGNOSIS — Z833 Family history of diabetes mellitus: Secondary | ICD-10-CM

## 2024-05-17 DIAGNOSIS — E7800 Pure hypercholesterolemia, unspecified: Secondary | ICD-10-CM | POA: Diagnosis present

## 2024-05-17 DIAGNOSIS — E119 Type 2 diabetes mellitus without complications: Secondary | ICD-10-CM

## 2024-05-17 DIAGNOSIS — I5033 Acute on chronic diastolic (congestive) heart failure: Secondary | ICD-10-CM | POA: Diagnosis present

## 2024-05-17 DIAGNOSIS — J189 Pneumonia, unspecified organism: Secondary | ICD-10-CM

## 2024-05-17 DIAGNOSIS — N1831 Chronic kidney disease, stage 3a: Secondary | ICD-10-CM | POA: Diagnosis present

## 2024-05-17 DIAGNOSIS — E876 Hypokalemia: Secondary | ICD-10-CM | POA: Diagnosis present

## 2024-05-17 DIAGNOSIS — A4101 Sepsis due to Methicillin susceptible Staphylococcus aureus: Principal | ICD-10-CM | POA: Diagnosis present

## 2024-05-17 DIAGNOSIS — I130 Hypertensive heart and chronic kidney disease with heart failure and stage 1 through stage 4 chronic kidney disease, or unspecified chronic kidney disease: Secondary | ICD-10-CM | POA: Diagnosis present

## 2024-05-17 DIAGNOSIS — L740 Miliaria rubra: Secondary | ICD-10-CM | POA: Diagnosis not present

## 2024-05-17 DIAGNOSIS — Z794 Long term (current) use of insulin: Secondary | ICD-10-CM

## 2024-05-17 DIAGNOSIS — E875 Hyperkalemia: Secondary | ICD-10-CM | POA: Diagnosis present

## 2024-05-17 DIAGNOSIS — Z7189 Other specified counseling: Secondary | ICD-10-CM

## 2024-05-17 DIAGNOSIS — E1122 Type 2 diabetes mellitus with diabetic chronic kidney disease: Secondary | ICD-10-CM | POA: Diagnosis present

## 2024-05-17 DIAGNOSIS — R6520 Severe sepsis without septic shock: Secondary | ICD-10-CM | POA: Diagnosis present

## 2024-05-17 DIAGNOSIS — Z681 Body mass index (BMI) 19 or less, adult: Secondary | ICD-10-CM

## 2024-05-17 DIAGNOSIS — J101 Influenza due to other identified influenza virus with other respiratory manifestations: Secondary | ICD-10-CM

## 2024-05-17 DIAGNOSIS — Z85819 Personal history of malignant neoplasm of unspecified site of lip, oral cavity, and pharynx: Secondary | ICD-10-CM

## 2024-05-17 DIAGNOSIS — I2489 Other forms of acute ischemic heart disease: Secondary | ICD-10-CM | POA: Diagnosis present

## 2024-05-17 DIAGNOSIS — R296 Repeated falls: Secondary | ICD-10-CM | POA: Diagnosis present

## 2024-05-17 DIAGNOSIS — E039 Hypothyroidism, unspecified: Secondary | ICD-10-CM | POA: Diagnosis present

## 2024-05-17 DIAGNOSIS — Z7951 Long term (current) use of inhaled steroids: Secondary | ICD-10-CM

## 2024-05-17 DIAGNOSIS — E43 Unspecified severe protein-calorie malnutrition: Secondary | ICD-10-CM | POA: Diagnosis present

## 2024-05-17 DIAGNOSIS — J440 Chronic obstructive pulmonary disease with acute lower respiratory infection: Secondary | ICD-10-CM | POA: Diagnosis present

## 2024-05-17 DIAGNOSIS — Z8249 Family history of ischemic heart disease and other diseases of the circulatory system: Secondary | ICD-10-CM

## 2024-05-17 DIAGNOSIS — I10 Essential (primary) hypertension: Secondary | ICD-10-CM | POA: Diagnosis present

## 2024-05-17 DIAGNOSIS — J09X1 Influenza due to identified novel influenza A virus with pneumonia: Secondary | ICD-10-CM | POA: Diagnosis present

## 2024-05-17 DIAGNOSIS — F0390 Unspecified dementia without behavioral disturbance: Secondary | ICD-10-CM | POA: Diagnosis present

## 2024-05-17 LAB — BASIC METABOLIC PANEL WITH GFR
Anion gap: 21 — ABNORMAL HIGH (ref 5–15)
Anion gap: 17 — ABNORMAL HIGH (ref 5–15)
BUN: 28 mg/dL — ABNORMAL HIGH (ref 8–23)
BUN: 26 mg/dL — ABNORMAL HIGH (ref 8–23)
CO2: 21 mmol/L — ABNORMAL LOW (ref 22–32)
CO2: 21 mmol/L — ABNORMAL LOW (ref 22–32)
Calcium: 9.6 mg/dL (ref 8.9–10.3)
Calcium: 8.3 mg/dL — ABNORMAL LOW (ref 8.9–10.3)
Chloride: 96 mmol/L — ABNORMAL LOW (ref 98–111)
Chloride: 99 mmol/L (ref 98–111)
Creatinine, Ser: 1.69 mg/dL — ABNORMAL HIGH (ref 0.44–1.00)
Creatinine, Ser: 1.46 mg/dL — ABNORMAL HIGH (ref 0.44–1.00)
GFR, Estimated: 29 mL/min — ABNORMAL LOW
GFR, Estimated: 34 mL/min — ABNORMAL LOW
Glucose, Bld: 330 mg/dL — ABNORMAL HIGH (ref 70–99)
Glucose, Bld: 496 mg/dL — ABNORMAL HIGH (ref 70–99)
Potassium: 4.8 mmol/L (ref 3.5–5.1)
Potassium: 4.2 mmol/L (ref 3.5–5.1)
Sodium: 138 mmol/L (ref 135–145)
Sodium: 137 mmol/L (ref 135–145)

## 2024-05-17 LAB — I-STAT VENOUS BLOOD GAS, ED
Acid-Base Excess: 0 mmol/L (ref 0.0–2.0)
Bicarbonate: 25.5 mmol/L (ref 20.0–28.0)
Calcium, Ion: 0.99 mmol/L — ABNORMAL LOW (ref 1.15–1.40)
HCT: 40 % (ref 36.0–46.0)
Hemoglobin: 13.6 g/dL (ref 12.0–15.0)
O2 Saturation: 36 %
Patient temperature: 98
Potassium: 5.6 mmol/L — ABNORMAL HIGH (ref 3.5–5.1)
Sodium: 135 mmol/L (ref 135–145)
TCO2: 27 mmol/L (ref 22–32)
pCO2, Ven: 41.4 mmHg — ABNORMAL LOW (ref 44–60)
pH, Ven: 7.396 (ref 7.25–7.43)
pO2, Ven: 21 mmHg — CL (ref 32–45)

## 2024-05-17 LAB — CBC
HCT: 39.1 % (ref 36.0–46.0)
Hemoglobin: 12.6 g/dL (ref 12.0–15.0)
MCH: 29 pg (ref 26.0–34.0)
MCHC: 32.2 g/dL (ref 30.0–36.0)
MCV: 90.1 fL (ref 80.0–100.0)
Platelets: 268 K/uL (ref 150–400)
RBC: 4.34 MIL/uL (ref 3.87–5.11)
RDW: 17.7 % — ABNORMAL HIGH (ref 11.5–15.5)
WBC: 12.6 K/uL — ABNORMAL HIGH (ref 4.0–10.5)
nRBC: 0.3 % — ABNORMAL HIGH (ref 0.0–0.2)

## 2024-05-17 LAB — RESP PANEL BY RT-PCR (RSV, FLU A&B, COVID)  RVPGX2
Influenza A by PCR: POSITIVE — AB
Influenza B by PCR: NEGATIVE
Resp Syncytial Virus by PCR: NEGATIVE
SARS Coronavirus 2 by RT PCR: NEGATIVE

## 2024-05-17 LAB — LACTIC ACID, PLASMA
Lactic Acid, Venous: 3.8 mmol/L (ref 0.5–1.9)
Lactic Acid, Venous: 6.2 mmol/L (ref 0.5–1.9)

## 2024-05-17 LAB — PRO BRAIN NATRIURETIC PEPTIDE: Pro Brain Natriuretic Peptide: 453 pg/mL — ABNORMAL HIGH

## 2024-05-17 LAB — TROPONIN T, HIGH SENSITIVITY
Troponin T High Sensitivity: 66 ng/L — ABNORMAL HIGH (ref 0–19)
Troponin T High Sensitivity: 48 ng/L — ABNORMAL HIGH (ref 0–19)

## 2024-05-17 LAB — CBG MONITORING, ED: Glucose-Capillary: 457 mg/dL — ABNORMAL HIGH (ref 70–99)

## 2024-05-17 MED ORDER — IPRATROPIUM-ALBUTEROL 0.5-2.5 (3) MG/3ML IN SOLN
3.0000 mL | RESPIRATORY_TRACT | Status: DC | PRN
  Administered 2024-05-17 (×2): 3 mL via RESPIRATORY_TRACT

## 2024-05-17 MED ORDER — SODIUM CHLORIDE 0.9 % IV SOLN
1.0000 g | Freq: Once | INTRAVENOUS | Status: AC
  Administered 2024-05-17: 1 g via INTRAVENOUS
  Filled 2024-05-17: qty 10

## 2024-05-17 MED ORDER — INSULIN ASPART 100 UNIT/ML IJ SOLN
0.0000 [IU] | INTRAMUSCULAR | Status: DC
  Administered 2024-05-18: 3 [IU] via SUBCUTANEOUS
  Filled 2024-05-17: qty 3

## 2024-05-17 MED ORDER — SODIUM CHLORIDE 0.9 % IV SOLN
500.0000 mg | Freq: Once | INTRAVENOUS | Status: AC
  Administered 2024-05-17: 500 mg via INTRAVENOUS
  Filled 2024-05-17: qty 5

## 2024-05-17 MED ORDER — SODIUM CHLORIDE 0.9 % IV SOLN
2.0000 g | INTRAVENOUS | Status: DC
  Administered 2024-05-17: 2 g via INTRAVENOUS
  Filled 2024-05-17: qty 12.5

## 2024-05-17 MED ORDER — LACTATED RINGERS IV BOLUS: 1000.0000 mL | INTRAVENOUS | Status: AC

## 2024-05-17 MED ORDER — INSULIN ASPART 100 UNIT/ML IJ SOLN
0.0000 [IU] | Freq: Four times a day (QID) | INTRAMUSCULAR | Status: DC
  Administered 2024-05-17: 6 [IU] via SUBCUTANEOUS
  Filled 2024-05-17: qty 1

## 2024-05-17 MED ORDER — LACTATED RINGERS IV BOLUS
1000.0000 mL | Freq: Once | INTRAVENOUS | Status: AC
  Administered 2024-05-17: 1000 mL via INTRAVENOUS

## 2024-05-17 MED ORDER — LACTATED RINGERS IV SOLN: INTRAVENOUS | Status: DC

## 2024-05-17 MED ORDER — VANCOMYCIN HCL IN DEXTROSE 1-5 GM/200ML-% IV SOLN
1000.0000 mg | Freq: Once | INTRAVENOUS | Status: AC
  Administered 2024-05-17: 1000 mg via INTRAVENOUS
  Filled 2024-05-17: qty 200

## 2024-05-17 MED ORDER — VANCOMYCIN VARIABLE DOSE PER UNSTABLE RENAL FUNCTION (PHARMACIST DOSING)
Status: DC
  Filled 2024-05-17: qty 1

## 2024-05-17 MED ORDER — OSELTAMIVIR PHOSPHATE 30 MG PO CAPS
30.0000 mg | ORAL_CAPSULE | Freq: Two times a day (BID) | ORAL | Status: DC
  Filled 2024-05-17 (×2): qty 1

## 2024-05-17 MED ORDER — LACTATED RINGERS IV BOLUS (SEPSIS)
350.0000 mL | Freq: Once | INTRAVENOUS | Status: AC
  Administered 2024-05-17: 350 mL via INTRAVENOUS

## 2024-05-17 MED ORDER — INSULIN ASPART 100 UNIT/ML IJ SOLN: 0.0000 [IU] | Freq: Every day | INTRAMUSCULAR | Status: DC

## 2024-05-17 MED ORDER — ALBUTEROL SULFATE (2.5 MG/3ML) 0.083% IN NEBU: 2.5000 mg | INHALATION_SOLUTION | Freq: Once | RESPIRATORY_TRACT | Status: DC

## 2024-05-17 MED ORDER — IPRATROPIUM-ALBUTEROL 0.5-2.5 (3) MG/3ML IN SOLN
RESPIRATORY_TRACT | Status: AC
  Administered 2024-05-17: 3 mL via RESPIRATORY_TRACT
  Filled 2024-05-17: qty 9

## 2024-05-17 MED ORDER — ALBUTEROL SULFATE (2.5 MG/3ML) 0.083% IN NEBU
5.0000 mg | INHALATION_SOLUTION | Freq: Once | RESPIRATORY_TRACT | Status: AC
  Administered 2024-05-17: 5 mg via RESPIRATORY_TRACT
  Filled 2024-05-17: qty 6

## 2024-05-17 NOTE — Plan of Care (Addendum)
 [facility] to [facility] progressive unit transfer:  89 year old old female history of mitral regurgitation, HFpEF, essential hypertension, asthma, COPD, hypothyroidism, insulin -dependent DM type II and CKD stage IIIa presented to emergency department accompanied by patient daughter with complaining of increasing shortness of breath for 2 days, increased work of breathing, swelling of the bilateral lower extremities, increased cough, fatigue, unable to get out of the bed and some confusion.  At presentation to ED patient found tachycardic initially heart rate was 147 improved to 117.  O2 sat 95 to 94% on 2 L.  Lab work, CBC showed leukocytosis 12.6 otherwise unremarkable.  BMP showing elevated creatinine 1.69, elevated blood glucose 230, low bicarb 21 elevated anion gap 21.  VBG showed low pCO2 21 and normal bicarb 25.  Normal pH. Initial troponin 66 which is around baseline.  Elevated lactic acid level 3.8.  Chest x-ray showing Chronic hyperinflation and bronchial thickening. Improvement in bibasilar aeration from prior. No progressive airspace disease.  CT chest: 1. Scattered clusters of ground-glass nodular densities with tree-in-bud appearance primarily in the lingula and to a lesser degree in the left lower lobe may be residual from prior infectious process or represent recurrent atypical infection. 2. Cholelithiasis. 3.  Aortic Atherosclerosis (ICD10-I70.0).  Respiratory panel positive with influenza A.  In the ED code sepsis has been activated.  Patient received 1.3 L of LR bolus, DuoNeb, ceftriaxone , azithromycin.   Hospitalist consulted for further evaluation management for sepsis in the setting of influenza A and community-acquired pneumonia, elevated troponin secondary to demand ischemia, AKI and hyperglycemic crisis.  Update at 11 p.m. patient has some tachypnea and lactic acid is trending up.  Currently due to respiratory distress patient has been placed on BiPAP in  the ED.  Lactic acid of 3.8>>6.2 receiving second liter of LR bolus however also concern for new onset of CHF as well.  ED physician reported that on BiPAP respiratory distress has been improved and patient tolerating BiPAP well.  [facility] will assume care on arrival to accepting facility. Until arrival, care as per EDP. However, [facility] available 24/7 for questions and assistance. Check [email or web address] for on-call coverage. Nursing staff, please call [facility] Admits & Consults System-Wide number under Amion on patient's arrival so appropriate admitting provider can evaluate the pt.   Author: Desman Polak, MD  [facility]

## 2024-05-17 NOTE — ED Triage Notes (Addendum)
 Pt to ED with increased shortness of breath and wheezing despite multiple breathing treatments administered at home. Recent Echo performed dx patient with leaky valve per family. Family also reporting altered mental status from baseline stating she has been incoherent   Multiple falls over the past 2 days due to lightheadedness.   Generalized abd swelling also reported.

## 2024-05-17 NOTE — ED Provider Notes (Signed)
 " [place] EMERGENCY DEPARTMENT AT [facility] Provider Note   CSN: [id number] Arrival date & time: 05/17/24  1650     Patient presents with: Shortness of Breath and Altered Mental Status   Hannah [name] is a 89 y.o. female.   89 year old non-English-speaking female brought in by daughter for evaluation of shortness of breath.  Daughter provides history.  Daughter states that patient has been short of breath the last 2 days and had increased work of breathing.  She is also states patient has had some swelling in her bilateral lower extremities and increased cough.  She states patient has been fatigued and weak and unable to get out of bed.  Patient denies any pain.   Shortness of Breath Associated symptoms: cough   Associated symptoms: no abdominal pain, no chest pain, no ear pain, no fever, no rash, no sore throat and no vomiting   Altered Mental Status Associated symptoms: weakness   Associated symptoms: no abdominal pain, no fever, no palpitations, no rash, no seizures and no vomiting        Prior to Admission medications  Medication Sig Start Date End Date Taking? Authorizing Provider  acetaminophen  (TYLENOL ) 325 MG tablet Take 650 mg by mouth every 6 (six) hours as needed for mild pain.     [provider]  albuterol  (PROVENTIL ) (2.5 MG/3ML) 0.083% nebulizer solution Take 3 mLs (2.5 mg total) by nebulization every 4 (four) hours as needed for wheezing or shortness of breath. 04/09/24 04/09/25  Horton, Roxie HERO, DO  albuterol  (VENTOLIN  HFA) 108 (90 Base) MCG/ACT inhaler Inhale 2 puffs into the lungs 3 (three) times daily for 3 days, THEN 2 puffs every 6 (six) hours as needed for wheezing or shortness of breath. 12/28/23 12/30/24  Hattar, Zola SAILOR, MD  amLODipine  (NORVASC ) 2.5 MG tablet Take 1 tablet (2.5 mg total) by mouth daily. 12/04/23   Sebastian Toribio GAILS, MD  arformoterol  (BROVANA ) 15 MCG/2ML NEBU Take 2 mLs (15 mcg total) by nebulization 2 (two) times daily.  02/17/24   Hattar, Zola SAILOR, MD  budesonide  (PULMICORT ) 0.5 MG/2ML nebulizer solution Take 2 mLs (0.5 mg total) by nebulization in the morning and at bedtime. 01/01/24   Hattar, Zola SAILOR, MD  Ca Carbonate-Mag Hydroxide 1000-200 MG CHEW Chew by mouth.    [provider]  CARBONYL IRON  PO Take 1 tablet by mouth daily.    [provider]  Cholecalciferol  (VITAMIN D -3) 125 MCG (5000 UT) TABS Take 5,000 Units by mouth daily.    [provider]  Coenzyme Q10 (CO Q 10 PO) Take 1 capsule by mouth daily.    [provider]  Continuous Glucose Sensor (FREESTYLE LIBRE 14 DAY SENSOR) MISC Inject 1 Device into the skin every 14 (fourteen) days. 11/18/23   [provider]  Cyanocobalamin  (B-12 SL) Place 1 tablet under the tongue daily.    [provider]  diclofenac  sodium (VOLTAREN ) 1 % GEL Apply 2 g topically 3 (three) times daily as needed (joint pain- affected areas).    [provider]  donepezil  (ARICEPT ) 5 MG tablet Take 5 mg by mouth at bedtime.    [provider]  doxycycline  (VIBRAMYCIN ) 100 MG capsule Take 1 capsule (100 mg total) by mouth 2 (two) times daily. 04/09/24   Horton, Roxie HERO, DO  ezetimibe -simvastatin  (VYTORIN ) 10-20 MG per tablet Take 1 tablet by mouth at bedtime.    [provider]  Finerenone  (KERENDIA ) 10 MG TABS Take 1 tablet (10 mg  total) by mouth daily. 03/09/24   Carlin Delon BROCKS, NP  furosemide  (LASIX ) 20 MG tablet TAKE 2 TO 3 TIMES WEEKLY 03/09/24   Carlin Delon BROCKS, NP  HYDROcodone -acetaminophen  (NORCO/VICODIN) 5-325 MG tablet Take 1 tablet by mouth See admin instructions. Take 1 tablet by mouth with lunch and in the evening 10/24/23   [provider]  insulin  glargine (LANTUS  SOLOSTAR) 100 UNIT/ML Solostar Pen Inject 20 Units into the skin at bedtime. 12/04/23   Sebastian Toribio GAILS, MD  insulin  lispro (HUMALOG) 100 UNIT/ML KwikPen Inject 2-12 Units into the skin See admin instructions. Inject 2-12  units into the skin three times a day with meals, per sliding scale    [provider]  JANUVIA 50 MG tablet Take 50 mg by mouth daily.    [provider]  latanoprost  (XALATAN ) 0.005 % ophthalmic solution Place 1 drop into both eyes at bedtime. 02/20/21   [provider]  levothyroxine  (SYNTHROID ) 50 MCG tablet Take 1 tablet (50 mcg total) by mouth See admin instructions. Take 25mcg alternating 50mcg every other day. 03/03/21   Gherghe, Costin M, MD  lidocaine  (XYLOCAINE ) 2 % solution Use as directed 3 mLs in the mouth or throat daily as needed for mouth pain. 04/28/18   [provider]  loratadine  (CLARITIN ) 10 MG tablet Take 1 tablet (10 mg total) by mouth daily. 12/04/23   Sebastian Toribio GAILS, MD  magnesium  oxide (MAG-OX) 400 (240 Mg) MG tablet Take 400 mg by mouth daily.    [provider]  megestrol  (MEGACE ) 400 MG/10ML suspension Take 600 mg by mouth daily before supper.    [provider]  metoprolol  tartrate (LOPRESSOR ) 25 MG tablet Take 1 tablet (25 mg total) by mouth 2 (two) times daily. 03/03/21   Gherghe, Costin M, MD  MYRBETRIQ  25 MG TB24 tablet Take 25 mg by mouth in the morning. 12/03/20   [provider]  omeprazole  (PRILOSEC) 20 MG capsule Take 1 capsule (20 mg total) by mouth every other day. 12/04/23   Sebastian Toribio GAILS, MD  ondansetron  (ZOFRAN ) 4 MG tablet Take 1 tablet (4 mg total) by mouth every 8 (eight) hours as needed for nausea or vomiting. 02/15/23   Jerrol Agent, MD  oxybutynin (DITROPAN-XL) 10 MG 24 hr tablet Take 10 mg by mouth daily. 02/12/24   [provider]  predniSONE  (DELTASONE ) 10 MG tablet Take 2 tablets (20 mg total) by mouth daily. 04/09/24   Horton, Roxie M, DO  simethicone  (MYLICON) 80 MG chewable tablet Chew 1 tablet (80 mg total) by mouth every 6 (six) hours as needed for flatulence. 12/03/23   Sebastian Toribio GAILS, MD  Vibegron (GEMTESA) 75 MG TABS Take 75 mg by mouth daily.    [provider]    Allergies: Penicillins and Sulfa antibiotics    Review of Systems  Constitutional:  Positive for fatigue. Negative for chills and fever.  HENT:  Negative for ear pain and sore throat.   Eyes:  Negative for pain and visual disturbance.  Respiratory:  Positive for cough and shortness of breath.   Cardiovascular:  Positive for leg swelling. Negative for chest pain and palpitations.  Gastrointestinal:  Negative for abdominal pain and vomiting.  Genitourinary:  Negative for dysuria and hematuria.  Musculoskeletal:  Negative for arthralgias and back pain.  Skin:  Negative for color change and rash.  Neurological:  Positive for weakness. Negative for seizures and syncope.  All other systems reviewed and are negative.   Updated  Vital Signs BP (!) 150/66 (BP Location: Right Arm)   Pulse (!) 115   Temp 99.1 F (37.3 C) (Oral)   Resp 19   Wt 49 kg   SpO2 95%   BMI 20.41 kg/m   Physical Exam Vitals and nursing note reviewed.  Constitutional:      General: She is not in acute distress.    Appearance: She is well-developed. She is ill-appearing.     Comments: Frail-appearing  HENT:     Head: Normocephalic and atraumatic.  Eyes:     Conjunctiva/sclera: Conjunctivae normal.  Cardiovascular:     Rate and Rhythm: Regular rhythm. Tachycardia present.     Heart sounds: No murmur heard. Pulmonary:     Effort: Tachypnea present. No respiratory distress.     Breath sounds: Wheezing, rhonchi and rales present.  Abdominal:     Palpations: Abdomen is soft.     Tenderness: There is no abdominal tenderness.     Comments: Abdomen is distended  Musculoskeletal:        General: No swelling.     Cervical back: Neck supple.     Right lower leg: Edema present.     Left lower leg: Edema present.  Skin:    General: Skin is warm and dry.     Capillary Refill: Capillary refill takes less than 2 seconds.  Neurological:     General: No focal deficit present.     Mental Status:  She is alert.     (all labs ordered are listed, but only abnormal results are displayed) Labs Reviewed  RESP PANEL BY RT-PCR (RSV, FLU A&B, COVID)  RVPGX2 - Abnormal; Notable for the following components:      Result Value   Influenza A by PCR POSITIVE (*)    All other components within normal limits  BASIC METABOLIC PANEL WITH GFR - Abnormal; Notable for the following components:   Chloride 96 (*)    CO2 21 (*)    Glucose, Bld 330 (*)    BUN 28 (*)    Creatinine, Ser 1.69 (*)    GFR, Estimated 29 (*)    Anion gap 21 (*)    All other components within normal limits  CBC - Abnormal; Notable for the following components:   WBC 12.6 (*)    RDW 17.7 (*)    nRBC 0.3 (*)    All other components within normal limits  LACTIC ACID, PLASMA - Abnormal; Notable for the following components:   Lactic Acid, Venous 3.8 (*)    All other components within normal limits  PRO BRAIN NATRIURETIC PEPTIDE - Abnormal; Notable for the following components:   Pro Brain Natriuretic Peptide 453.0 (*)    All other components within normal limits  BASIC METABOLIC PANEL WITH GFR - Abnormal; Notable for the following components:   CO2 21 (*)    Glucose, Bld 496 (*)    BUN 26 (*)    Creatinine, Ser 1.46 (*)    Calcium  8.3 (*)    GFR, Estimated 34 (*)    Anion gap 17 (*)    All other components within normal limits  LACTIC ACID, PLASMA - Abnormal; Notable for the following components:   Lactic Acid, Venous 6.2 (*)    All other components within normal limits  I-STAT VENOUS BLOOD GAS, ED - Abnormal; Notable for the following components:   pCO2, Ven 41.4 (*)    pO2, Ven 21 (*)    Potassium 5.6 (*)    Calcium ,  Ion 0.99 (*)    All other components within normal limits  CBG MONITORING, ED - Abnormal; Notable for the following components:   Glucose-Capillary 457 (*)    All other components within normal limits  TROPONIN T, HIGH SENSITIVITY - Abnormal; Notable for the following components:   Troponin T  High Sensitivity 66 (*)    All other components within normal limits  TROPONIN T, HIGH SENSITIVITY - Abnormal; Notable for the following components:   Troponin T High Sensitivity 48 (*)    All other components within normal limits  CULTURE, BLOOD (SINGLE)  URINALYSIS, ROUTINE W REFLEX MICROSCOPIC  CREATININE, URINE, RANDOM  SODIUM, URINE, RANDOM  CBC  COMPREHENSIVE METABOLIC PANEL WITH GFR  LACTIC ACID, PLASMA    EKG: EKG Interpretation Date/Time:  Thursday May 17 2024 17:19:38 EST Ventricular Rate:  138 PR Interval:  146 QRS Duration:  117 QT Interval:  296 QTC Calculation: 449 R Axis:   86  Text Interpretation: Sinus tachycardia Atrial premature complex Nonspecific intraventricular conduction delay Nonspecific ST abnormalities in the anterior and inferior leads Compared with prior EKG from 04/09/2024 Confirmed by Gennaro Bouchard [phone]) on 05/17/2024 5:50:37 PM  Radiology: CT Chest Wo Contrast Result Date: 05/17/2024 CLINICAL DATA:  Shortness of breath and cough. EXAM: CT CHEST WITHOUT CONTRAST TECHNIQUE: Multidetector CT imaging of the chest was performed following the standard protocol without IV contrast. RADIATION DOSE REDUCTION: This exam was performed according to the departmental dose-optimization program which includes automated exposure control, adjustment of the mA and/or kV according to patient size and/or use of iterative reconstruction technique. COMPARISON:  Chest CT dated 04/09/2024. FINDINGS: Evaluation of this exam is limited in the absence of intravenous contrast. Cardiovascular: There is no cardiomegaly. Trace pericardial effusion. There is mild atherosclerotic calcification of the thoracic aorta. No aneurysmal dilatation. The central pulmonary arteries are grossly unremarkable. Mediastinum/Nodes: No hilar or mediastinal adenopathy. The esophagus is grossly unremarkable. No mediastinal fluid collection. Lungs/Pleura: Linear scarring in the right middle lobe appear  scattered clusters of ground-glass nodular densities with tree-in-bud appearance primarily in the lingula and to a lesser degree in the left lower lobe may be residual from prior infectious process or represent recurrent atypical infection. No consolidative changes. There is no pleural effusion or pneumothorax. Right apical scarring. 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 The central airways are patent. Upper Abdomen: Gallstones. Musculoskeletal: Osteopenia.  No acute osseous pathology. IMPRESSION: 1. Scattered clusters of ground-glass nodular densities with tree-in-bud appearance primarily in the lingula and to a lesser degree in the left lower lobe may be residual from prior infectious process or represent recurrent atypical infection. 2. Cholelithiasis. 3.  Aortic Atherosclerosis (ICD10-I70.0). Electronically Signed   By: Vanetta Chou M.D.   On: 05/17/2024 18:35   DG Chest Port 1 View Result Date: 05/17/2024 CLINICAL DATA:  Chest pain. EXAM: PORTABLE CHEST 1 VIEW COMPARISON:  04/09/2024 radiographs and CT FINDINGS: The heart is normal in size. Mediastinal contours are stable with aortic atherosclerosis. Chronic hyperinflation and bronchial thickening. Improvement in bibasilar aeration from prior. No progressive airspace disease. Right costophrenic angle not included in the field of view. No pneumothorax or large pleural effusion. On limited assessment, no acute osseous findings. IMPRESSION: Chronic hyperinflation and bronchial thickening. Improvement in bibasilar aeration from prior. No progressive airspace disease. Electronically Signed   By: Andrea Gasman M.D.   On: 05/17/2024 17:53     .Critical Care  Performed by: Gennaro Bouchard CROME, DO Authorized by: Gennaro Bouchard CROME, DO   Critical care provider  statement:    Critical care time (minutes):  50   Critical care time was exclusive of:  Separately billable procedures and treating other patients and teaching time   Critical care was necessary to treat or  prevent imminent or life-threatening deterioration of the following conditions:  Respiratory failure and sepsis   Care discussed with: admitting provider      Medications Ordered in the ED  ipratropium-albuterol  (DUONEB) 0.5-2.5 (3) MG/3ML nebulizer solution 3 mL (3 mLs Nebulization Given 05/17/24 1709)  lactated ringers  infusion ( Intravenous New Bag/Given 05/17/24 2136)  oseltamivir (TAMIFLU) capsule 30 mg (30 mg Oral Not Given 05/17/24 2137)  ceFEPIme  (MAXIPIME ) 2 g in sodium chloride  0.9 % 100 mL IVPB (0 g Intravenous Stopped 05/17/24 2220)  vancomycin  (VANCOCIN ) IVPB 1000 mg/200 mL premix (1,000 mg Intravenous New Bag/Given 05/17/24 2224)  vancomycin  variable dose per unstable renal function (pharmacist dosing) (has no administration in time range)  lactated ringers  bolus 1,000 mL (1,000 mLs Intravenous Not Given 05/17/24 2312)  insulin  aspart (novoLOG ) injection 0-6 Units (has no administration in time range)  lactated ringers  bolus 1,000 mL (0 mLs Intravenous Stopped 05/17/24 2038)  cefTRIAXone  (ROCEPHIN ) 1 g in sodium chloride  0.9 % 100 mL IVPB (0 g Intravenous Stopped 05/17/24 1940)  azithromycin (ZITHROMAX) 500 mg in sodium chloride  0.9 % 250 mL IVPB (0 mg Intravenous Stopped 05/17/24 2115)  lactated ringers  bolus 350 mL (0 mLs Intravenous Stopped 05/17/24 2136)  albuterol  (PROVENTIL ) (2.5 MG/3ML) 0.083% nebulizer solution 5 mg (5 mg Nebulization Given 05/17/24 2210)                                    Medical Decision Making Cardiac monitor interpretation: Sinus tachycardia, no ectopy  Social determinants of health: Non-English-speaking  Patient here for tachypnea and cough.  Daughter provides translation and most of history.  Patient arrived tachycardic, wheezing on exam and also with some rhonchi.  She was tachypneic as well.  Breathing treatments did not seem to help.  Was placed on 2 L nasal cannula.  Found to have sepsis and pneumonia and influenza A.  She was treated with a 30 cc/kg IV fluid  bolus as her lactate was elevated and she had a leukocytosis and also given Rocephin  and azithromycin.  Does have some elevated BNP and troponin and patient may have some element of demand ischemia versus new onset heart failure.  Discussed patient's case with hospitalist and she will be admitted for further workup and management.  Patient was still boarding in the emergency department waiting for her bed she developed some respiratory distress and tachypnea as well as worsening of her lung sounds.  Was started on BiPAP and given a breathing treatment with much improvement in her symptoms.  On reevaluation heart rate has improved and work of breathing has greatly improved.  Hospitalist was updated and we will transfer the patient to [facility] to a progressive/stepdown unit bed.  Patient and family updated throughout their stay in the ER and family is agreeable with plan for admission.  Problems Addressed: Acute hypoxic respiratory failure (HCC): acute illness or injury that poses a threat to life or bodily functions Influenza A: acute illness or injury Pneumonia due to infectious organism, unspecified laterality, unspecified part of lung: acute illness or injury that poses a threat to life or bodily functions Sepsis, due to unspecified organism, unspecified whether acute organ dysfunction present [facility]): acute illness or  injury that poses a threat to life or bodily functions  Amount and/or Complexity of Data Reviewed Independent Historian: caregiver    Details: Daughter at bedside provides entire history regarding patient's weakness today, inability to get out of bed, medical history and difficulty breathing External Data Reviewed: notes.    Details: Prior ED records reviewed and patient seen 04-09-2024 for pneumonia Labs: ordered. Decision-making details documented in ED Course.    Details: Ordered and reviewed by me and patient with elevated troponin and BNP, climbing lactate as well as  leukocytosis Radiology: ordered and independent interpretation performed. Decision-making details documented in ED Course.    Details: Ordered and interpreted by me independently of radiology Chest x-ray: Fairly unremarkable CT chest, shows evidence of atypical infection ECG/medicine tests: ordered and independent interpretation performed. Decision-making details documented in ED Course.    Details: Ordered and interpreted by me in the absence of cardiology and shows sinus tachycardia, no STEMI, or significant change when compared to prior EKG Discussion of management or test interpretation with external provider(s): Dr. Lee -hospitalist-I spoke with her on the phone regarding patient's case and she will meet the patient for further workup and management.  She was updated when patient decompensated and needed BiPAP.  Risk OTC drugs. Prescription drug management. Drug therapy requiring intensive monitoring for toxicity. Decision regarding hospitalization. Diagnosis or treatment significantly limited by social determinants of health. Risk Details: CRITICAL CARE Performed by: Duwaine LITTIE Fusi   Total critical care time: 50 minutes  Critical care time was exclusive of separately billable procedures and treating other patients.  Critical care was necessary to treat or prevent imminent or life-threatening deterioration.  Critical care was time spent personally by me on the following activities: development of treatment plan with patient and/or surrogate as well as nursing, discussions with consultants, evaluation of patient's response to treatment, examination of patient, obtaining history from patient or surrogate, ordering and performing treatments and interventions, ordering and review of laboratory studies, ordering and review of radiographic studies, pulse oximetry and re-evaluation of patient's condition.    Critical Care Total time providing critical care: 50 minutes     Final  diagnoses:  Acute hypoxic respiratory failure (HCC)  Sepsis, due to unspecified organism, unspecified whether acute organ dysfunction present (HCC)  Pneumonia due to infectious organism, unspecified laterality, unspecified part of lung  Influenza A    ED Discharge Orders     None          Fusi Duwaine LITTIE, DO 05/17/24 2323  "

## 2024-05-17 NOTE — Sepsis Progress Note (Signed)
 Elink monitoring for the code sepsis protocol.

## 2024-05-17 NOTE — Progress Notes (Signed)
 Pharmacy Antibiotic Note  Hannah [name] is a 89 y.o. female admitted on 05/17/2024 with sepsis.  Pharmacy has been consulted for vancomycin  and cefepime  dosing. Patient with AKI - Scr: 1.69, baseline around 0.77 based on chart review.   Plan: Cefepime  2g q24h.  Will give vancomycin  1000mg  IV load, then f/u renal function in the morning to schedule regimen.  Follow culture data for de-escalation.  Monitor renal function for dose adjustments as indicated.      Temp (24hrs), Avg:98 F (36.7 C), Min:98 F (36.7 C), Max:98 F (36.7 C)  Recent Labs  Lab 05/17/24 1721 05/17/24 1753  WBC 12.6*  --   CREATININE 1.69*  --   LATICACIDVEN  --  3.8*    CrCl cannot be calculated (Unknown ideal weight.).    Allergies[1]  Antimicrobials this admission: Vancomycin  1/1 >>  Cefepime  1/1 >>   Microbiology results: 1/1 BCx:   Thank you for allowing pharmacy to be a part of this patients care.  Powell Blush, PharmD, BCCCP  05/17/2024 8:38 PM     [1]  Allergies Allergen Reactions   Penicillins Rash and Other (See Comments)    1980's   Sulfa Antibiotics Rash

## 2024-05-17 NOTE — ED Notes (Signed)
 Patient transported to CT

## 2024-05-17 NOTE — ED Notes (Signed)
 Pt is on 2 lpm [place]

## 2024-05-17 NOTE — ED Notes (Signed)
 Pt placed on NIV PS setting of 10/5 BUR 15 40%. RT administered 5mg  albuterol  via aerogen through the vent at time of placement. Pt tolerating well. Pt respiratory status stable w/no distress noted at this time. Pt BLBS rhonchi/coarse. RT will continue to monitor while at [facility].    05/17/24 2211  BiPAP/CPAP/SIPAP  $ Non-Invasive Ventilator  Non-Invasive Vent Initial  $ Face Mask Small Yes  BiPAP/CPAP/SIPAP Pt Type Adult  BiPAP/CPAP/SIPAP SERVO  Mask Type Full face mask  Dentures removed? Not applicable  Mask Size Small  Set Rate 15 breaths/min  Respiratory Rate 18 breaths/min  IPAP 10 cmH20  EPAP 5 cmH2O  Pressure Support 5 cmH20  PEEP 5 cmH20  FiO2 (%) 40 %  Flow Rate 0 lpm  Minute Ventilation 11.2  Leak 12  Peak Inspiratory Pressure (PIP) 10  Tidal Volume (Vt) 631  Press High Alarm 25 cmH2O  Press Low Alarm 16 cmH2O  BiPAP/CPAP /SiPAP Vitals  Pulse Rate (!) 115  Resp 19  SpO2 95 %  Bilateral Breath Sounds Rhonchi;Coarse crackles  MEWS Score/Color  MEWS Score 2  MEWS Score Color Yellow

## 2024-05-18 ENCOUNTER — Emergency Department (HOSPITAL_COMMUNITY)

## 2024-05-18 DIAGNOSIS — D509 Iron deficiency anemia, unspecified: Secondary | ICD-10-CM | POA: Diagnosis present

## 2024-05-18 DIAGNOSIS — N1831 Chronic kidney disease, stage 3a: Secondary | ICD-10-CM | POA: Diagnosis present

## 2024-05-18 DIAGNOSIS — B954 Other streptococcus as the cause of diseases classified elsewhere: Secondary | ICD-10-CM | POA: Diagnosis not present

## 2024-05-18 DIAGNOSIS — Z711 Person with feared health complaint in whom no diagnosis is made: Secondary | ICD-10-CM | POA: Diagnosis not present

## 2024-05-18 DIAGNOSIS — N189 Chronic kidney disease, unspecified: Secondary | ICD-10-CM | POA: Diagnosis not present

## 2024-05-18 DIAGNOSIS — N179 Acute kidney failure, unspecified: Secondary | ICD-10-CM

## 2024-05-18 DIAGNOSIS — I5033 Acute on chronic diastolic (congestive) heart failure: Secondary | ICD-10-CM | POA: Diagnosis present

## 2024-05-18 DIAGNOSIS — J15211 Pneumonia due to Methicillin susceptible Staphylococcus aureus: Secondary | ICD-10-CM | POA: Diagnosis present

## 2024-05-18 DIAGNOSIS — J09X1 Influenza due to identified novel influenza A virus with pneumonia: Secondary | ICD-10-CM | POA: Diagnosis present

## 2024-05-18 DIAGNOSIS — A419 Sepsis, unspecified organism: Secondary | ICD-10-CM | POA: Diagnosis not present

## 2024-05-18 DIAGNOSIS — B9561 Methicillin susceptible Staphylococcus aureus infection as the cause of diseases classified elsewhere: Secondary | ICD-10-CM | POA: Diagnosis not present

## 2024-05-18 DIAGNOSIS — J9601 Acute respiratory failure with hypoxia: Principal | ICD-10-CM | POA: Diagnosis present

## 2024-05-18 DIAGNOSIS — I5021 Acute systolic (congestive) heart failure: Secondary | ICD-10-CM | POA: Diagnosis not present

## 2024-05-18 DIAGNOSIS — E872 Acidosis, unspecified: Secondary | ICD-10-CM | POA: Diagnosis present

## 2024-05-18 DIAGNOSIS — N390 Urinary tract infection, site not specified: Secondary | ICD-10-CM | POA: Diagnosis present

## 2024-05-18 DIAGNOSIS — E43 Unspecified severe protein-calorie malnutrition: Secondary | ICD-10-CM | POA: Diagnosis present

## 2024-05-18 DIAGNOSIS — A4101 Sepsis due to Methicillin susceptible Staphylococcus aureus: Secondary | ICD-10-CM | POA: Diagnosis present

## 2024-05-18 DIAGNOSIS — E1122 Type 2 diabetes mellitus with diabetic chronic kidney disease: Secondary | ICD-10-CM | POA: Diagnosis present

## 2024-05-18 DIAGNOSIS — D72829 Elevated white blood cell count, unspecified: Secondary | ICD-10-CM

## 2024-05-18 DIAGNOSIS — I82402 Acute embolism and thrombosis of unspecified deep veins of left lower extremity: Secondary | ICD-10-CM | POA: Diagnosis present

## 2024-05-18 DIAGNOSIS — R6520 Severe sepsis without septic shock: Secondary | ICD-10-CM | POA: Diagnosis present

## 2024-05-18 DIAGNOSIS — Z8616 Personal history of COVID-19: Secondary | ICD-10-CM | POA: Diagnosis not present

## 2024-05-18 DIAGNOSIS — J101 Influenza due to other identified influenza virus with other respiratory manifestations: Secondary | ICD-10-CM | POA: Diagnosis present

## 2024-05-18 DIAGNOSIS — R197 Diarrhea, unspecified: Secondary | ICD-10-CM | POA: Diagnosis not present

## 2024-05-18 DIAGNOSIS — E039 Hypothyroidism, unspecified: Secondary | ICD-10-CM

## 2024-05-18 DIAGNOSIS — R609 Edema, unspecified: Secondary | ICD-10-CM | POA: Diagnosis not present

## 2024-05-18 DIAGNOSIS — Z7189 Other specified counseling: Secondary | ICD-10-CM | POA: Diagnosis not present

## 2024-05-18 DIAGNOSIS — R7881 Bacteremia: Secondary | ICD-10-CM

## 2024-05-18 DIAGNOSIS — J440 Chronic obstructive pulmonary disease with acute lower respiratory infection: Secondary | ICD-10-CM | POA: Diagnosis present

## 2024-05-18 DIAGNOSIS — J189 Pneumonia, unspecified organism: Secondary | ICD-10-CM | POA: Diagnosis not present

## 2024-05-18 DIAGNOSIS — F0390 Unspecified dementia without behavioral disturbance: Secondary | ICD-10-CM | POA: Diagnosis present

## 2024-05-18 DIAGNOSIS — B370 Candidal stomatitis: Secondary | ICD-10-CM | POA: Diagnosis not present

## 2024-05-18 DIAGNOSIS — J1008 Influenza due to other identified influenza virus with other specified pneumonia: Secondary | ICD-10-CM | POA: Diagnosis present

## 2024-05-18 DIAGNOSIS — I130 Hypertensive heart and chronic kidney disease with heart failure and stage 1 through stage 4 chronic kidney disease, or unspecified chronic kidney disease: Secondary | ICD-10-CM | POA: Diagnosis present

## 2024-05-18 DIAGNOSIS — G9341 Metabolic encephalopathy: Secondary | ICD-10-CM | POA: Diagnosis present

## 2024-05-18 DIAGNOSIS — Z66 Do not resuscitate: Secondary | ICD-10-CM | POA: Diagnosis not present

## 2024-05-18 DIAGNOSIS — Z515 Encounter for palliative care: Secondary | ICD-10-CM | POA: Diagnosis not present

## 2024-05-18 DIAGNOSIS — I2489 Other forms of acute ischemic heart disease: Secondary | ICD-10-CM | POA: Diagnosis present

## 2024-05-18 LAB — BLOOD CULTURE ID PANEL (REFLEXED) - BCID2

## 2024-05-18 LAB — I-STAT ARTERIAL BLOOD GAS, ED
Acid-base deficit: 3 mmol/L — ABNORMAL HIGH (ref 0.0–2.0)
Bicarbonate: 20.4 mmol/L (ref 20.0–28.0)
Calcium, Ion: 1.16 mmol/L (ref 1.15–1.40)
HCT: 31 % — ABNORMAL LOW (ref 36.0–46.0)
Hemoglobin: 10.5 g/dL — ABNORMAL LOW (ref 12.0–15.0)
O2 Saturation: 98 %
Patient temperature: 99.1
Potassium: 4 mmol/L (ref 3.5–5.1)
Sodium: 139 mmol/L (ref 135–145)
TCO2: 21 mmol/L — ABNORMAL LOW (ref 22–32)
pCO2 arterial: 29.4 mmHg — ABNORMAL LOW (ref 32–48)
pH, Arterial: 7.451 — ABNORMAL HIGH (ref 7.35–7.45)
pO2, Arterial: 97 mmHg (ref 83–108)

## 2024-05-18 LAB — PRO BRAIN NATRIURETIC PEPTIDE: Pro Brain Natriuretic Peptide: 644 pg/mL — ABNORMAL HIGH

## 2024-05-18 LAB — URINALYSIS, ROUTINE W REFLEX MICROSCOPIC
Bilirubin Urine: NEGATIVE
Glucose, UA: 500 mg/dL — AB
Hgb urine dipstick: NEGATIVE
Ketones, ur: NEGATIVE mg/dL
Nitrite: POSITIVE — AB
Specific Gravity, Urine: 1.015 (ref 1.005–1.030)
pH: 7.5 (ref 5.0–8.0)

## 2024-05-18 LAB — COMPREHENSIVE METABOLIC PANEL WITH GFR
ALT: 31 U/L (ref 0–44)
AST: 47 U/L — ABNORMAL HIGH (ref 15–41)
Albumin: 3.2 g/dL — ABNORMAL LOW (ref 3.5–5.0)
Alkaline Phosphatase: 49 U/L (ref 38–126)
Anion gap: 13 (ref 5–15)
BUN: 22 mg/dL (ref 8–23)
CO2: 25 mmol/L (ref 22–32)
Calcium: 8.8 mg/dL — ABNORMAL LOW (ref 8.9–10.3)
Chloride: 102 mmol/L (ref 98–111)
Creatinine, Ser: 1.26 mg/dL — ABNORMAL HIGH (ref 0.44–1.00)
GFR, Estimated: 41 mL/min — ABNORMAL LOW
Glucose, Bld: 291 mg/dL — ABNORMAL HIGH (ref 70–99)
Potassium: 4.3 mmol/L (ref 3.5–5.1)
Sodium: 140 mmol/L (ref 135–145)
Total Bilirubin: <0.2 mg/dL (ref 0.0–1.2)
Total Protein: 5.5 g/dL — ABNORMAL LOW (ref 6.5–8.1)

## 2024-05-18 LAB — CBC
HCT: 31 % — ABNORMAL LOW (ref 36.0–46.0)
Hemoglobin: 10.3 g/dL — ABNORMAL LOW (ref 12.0–15.0)
MCH: 29.6 pg (ref 26.0–34.0)
MCHC: 33.2 g/dL (ref 30.0–36.0)
MCV: 89.1 fL (ref 80.0–100.0)
Platelets: 226 K/uL (ref 150–400)
RBC: 3.48 MIL/uL — ABNORMAL LOW (ref 3.87–5.11)
RDW: 17.6 % — ABNORMAL HIGH (ref 11.5–15.5)
WBC: 12.7 K/uL — ABNORMAL HIGH (ref 4.0–10.5)
nRBC: 0.6 % — ABNORMAL HIGH (ref 0.0–0.2)

## 2024-05-18 LAB — ECHOCARDIOGRAM COMPLETE
Area-P 1/2: 4.8 cm2
Calc EF: 57.8 %
Height: 60 in
MV M vel: 3.69 m/s
MV Peak grad: 54.3 mmHg
S' Lateral: 2.7 cm
Single Plane A2C EF: 55 %
Single Plane A4C EF: 64.5 %
Weight: 1463.85 [oz_av]

## 2024-05-18 LAB — CBG MONITORING, ED
Glucose-Capillary: 269 mg/dL — ABNORMAL HIGH (ref 70–99)
Glucose-Capillary: 212 mg/dL — ABNORMAL HIGH (ref 70–99)
Glucose-Capillary: 130 mg/dL — ABNORMAL HIGH (ref 70–99)

## 2024-05-18 LAB — GLUCOSE, CAPILLARY
Glucose-Capillary: 107 mg/dL — ABNORMAL HIGH (ref 70–99)
Glucose-Capillary: 176 mg/dL — ABNORMAL HIGH (ref 70–99)
Glucose-Capillary: 261 mg/dL — ABNORMAL HIGH (ref 70–99)

## 2024-05-18 LAB — LACTIC ACID, PLASMA
Lactic Acid, Venous: 3.3 mmol/L (ref 0.5–1.9)
Lactic Acid, Venous: 3.4 mmol/L (ref 0.5–1.9)
Lactic Acid, Venous: 1.7 mmol/L (ref 0.5–1.9)
Lactic Acid, Venous: 1.5 mmol/L (ref 0.5–1.9)

## 2024-05-18 LAB — CREATININE, URINE, RANDOM: Creatinine, Urine: 42 mg/dL

## 2024-05-18 LAB — C-REACTIVE PROTEIN: CRP: 7.5 mg/dL — ABNORMAL HIGH

## 2024-05-18 LAB — PHOSPHORUS: Phosphorus: 2.7 mg/dL (ref 2.5–4.6)

## 2024-05-18 LAB — SODIUM, URINE, RANDOM: Sodium, Ur: 150 mmol/L

## 2024-05-18 LAB — SEDIMENTATION RATE: Sed Rate: 35 mm/h — ABNORMAL HIGH (ref 0–22)

## 2024-05-18 LAB — MAGNESIUM: Magnesium: 1.6 mg/dL — ABNORMAL LOW (ref 1.7–2.4)

## 2024-05-18 MED ORDER — IPRATROPIUM-ALBUTEROL 0.5-2.5 (3) MG/3ML IN SOLN
3.0000 mL | Freq: Four times a day (QID) | RESPIRATORY_TRACT | Status: DC
  Administered 2024-05-18 – 2024-05-30 (×46): 3 mL via RESPIRATORY_TRACT
  Filled 2024-05-18 (×44): qty 3

## 2024-05-18 MED ORDER — SIMVASTATIN 20 MG PO TABS
20.0000 mg | ORAL_TABLET | Freq: Every day | ORAL | Status: DC
  Administered 2024-05-19 – 2024-05-26 (×8): 20 mg via ORAL
  Filled 2024-05-18 (×9): qty 1

## 2024-05-18 MED ORDER — SODIUM CHLORIDE 0.9 % IV SOLN: 1.0000 g | INTRAVENOUS | Status: DC

## 2024-05-18 MED ORDER — INSULIN ASPART 100 UNIT/ML IJ SOLN
0.0000 [IU] | Freq: Three times a day (TID) | INTRAMUSCULAR | Status: DC
  Administered 2024-05-19: 4 [IU] via SUBCUTANEOUS
  Administered 2024-05-19: 20 [IU] via SUBCUTANEOUS
  Administered 2024-05-19 – 2024-05-20 (×2): 4 [IU] via SUBCUTANEOUS
  Administered 2024-05-20: 20 [IU] via SUBCUTANEOUS
  Administered 2024-05-20: 11 [IU] via SUBCUTANEOUS
  Administered 2024-05-21: 7 [IU] via SUBCUTANEOUS
  Administered 2024-05-21: 4 [IU] via SUBCUTANEOUS
  Administered 2024-05-22 (×2): 11 [IU] via SUBCUTANEOUS
  Administered 2024-05-22: 7 [IU] via SUBCUTANEOUS
  Administered 2024-05-23: 4 [IU] via SUBCUTANEOUS
  Administered 2024-05-23 (×2): 7 [IU] via SUBCUTANEOUS
  Administered 2024-05-24: 11 [IU] via SUBCUTANEOUS
  Administered 2024-05-24 (×2): 4 [IU] via SUBCUTANEOUS
  Administered 2024-05-25: 3 [IU] via SUBCUTANEOUS
  Administered 2024-05-25: 4 [IU] via SUBCUTANEOUS
  Administered 2024-05-25: 3 [IU] via SUBCUTANEOUS
  Administered 2024-05-26: 7 [IU] via SUBCUTANEOUS
  Administered 2024-05-26 (×2): 3 [IU] via SUBCUTANEOUS
  Administered 2024-05-27 (×2): 4 [IU] via SUBCUTANEOUS
  Administered 2024-05-28: 3 [IU] via SUBCUTANEOUS
  Administered 2024-05-28 (×2): 7 [IU] via SUBCUTANEOUS
  Administered 2024-05-29: 3 [IU] via SUBCUTANEOUS
  Administered 2024-05-29: 4 [IU] via SUBCUTANEOUS
  Filled 2024-05-18: qty 11
  Filled 2024-05-18: qty 4
  Filled 2024-05-18: qty 7
  Filled 2024-05-18: qty 15
  Filled 2024-05-18: qty 1
  Filled 2024-05-18: qty 4
  Filled 2024-05-18: qty 7
  Filled 2024-05-18: qty 11
  Filled 2024-05-18: qty 4
  Filled 2024-05-18: qty 3
  Filled 2024-05-18: qty 7
  Filled 2024-05-18 (×2): qty 4
  Filled 2024-05-18: qty 7
  Filled 2024-05-18: qty 3
  Filled 2024-05-18: qty 7
  Filled 2024-05-18: qty 3
  Filled 2024-05-18 (×2): qty 11
  Filled 2024-05-18: qty 20
  Filled 2024-05-18: qty 4
  Filled 2024-05-18: qty 3
  Filled 2024-05-18: qty 1
  Filled 2024-05-18 (×5): qty 4
  Filled 2024-05-18: qty 1

## 2024-05-18 MED ORDER — ZINC SULFATE 220 (50 ZN) MG PO CAPS
220.0000 mg | ORAL_CAPSULE | Freq: Every day | ORAL | Status: DC
  Administered 2024-05-18 – 2024-05-22 (×5): 220 mg via ORAL
  Filled 2024-05-18 (×5): qty 1

## 2024-05-18 MED ORDER — CEFAZOLIN SODIUM-DEXTROSE 2-4 GM/100ML-% IV SOLN
2.0000 g | Freq: Two times a day (BID) | INTRAVENOUS | Status: DC
  Administered 2024-05-18 – 2024-05-26 (×16): 2 g via INTRAVENOUS
  Filled 2024-05-18 (×17): qty 100

## 2024-05-18 MED ORDER — HYDROCOD POLI-CHLORPHE POLI ER 10-8 MG/5ML PO SUER
5.0000 mL | Freq: Two times a day (BID) | ORAL | Status: DC
  Administered 2024-05-18 – 2024-05-29 (×22): 5 mL via ORAL
  Filled 2024-05-18 (×22): qty 5

## 2024-05-18 MED ORDER — EZETIMIBE-SIMVASTATIN 10-20 MG PO TABS: 1.0000 | ORAL_TABLET | Freq: Every day | ORAL | Status: DC

## 2024-05-18 MED ORDER — INSULIN GLARGINE-YFGN 100 UNIT/ML ~~LOC~~ SOLN: 25.0000 [IU] | Freq: Every day | SUBCUTANEOUS | Status: DC

## 2024-05-18 MED ORDER — AMLODIPINE BESYLATE 5 MG PO TABS
2.5000 mg | ORAL_TABLET | Freq: Every day | ORAL | Status: DC
  Administered 2024-05-18 – 2024-05-26 (×9): 2.5 mg via ORAL
  Filled 2024-05-18 (×9): qty 1

## 2024-05-18 MED ORDER — HEPARIN SODIUM (PORCINE) 5000 UNIT/ML IJ SOLN
5000.0000 [IU] | Freq: Three times a day (TID) | INTRAMUSCULAR | Status: DC
  Administered 2024-05-18 – 2024-05-24 (×19): 5000 [IU] via SUBCUTANEOUS
  Filled 2024-05-18 (×19): qty 1

## 2024-05-18 MED ORDER — ONDANSETRON HCL 4 MG/2ML IJ SOLN: 4.0000 mg | Freq: Four times a day (QID) | INTRAMUSCULAR | Status: DC | PRN

## 2024-05-18 MED ORDER — SODIUM CHLORIDE 0.9% FLUSH
3.0000 mL | Freq: Two times a day (BID) | INTRAVENOUS | Status: DC
  Administered 2024-05-18 – 2024-05-30 (×23): 3 mL via INTRAVENOUS

## 2024-05-18 MED ORDER — MEGESTROL ACETATE 400 MG/10ML PO SUSP
600.0000 mg | Freq: Every day | ORAL | Status: DC
  Administered 2024-05-18 – 2024-05-23 (×6): 600 mg via ORAL
  Filled 2024-05-18 (×8): qty 15
  Filled 2024-05-18: qty 20
  Filled 2024-05-18: qty 15

## 2024-05-18 MED ORDER — HYDRALAZINE HCL 20 MG/ML IJ SOLN: 10.0000 mg | INTRAMUSCULAR | Status: DC | PRN

## 2024-05-18 MED ORDER — HYDROMORPHONE HCL 1 MG/ML IJ SOLN
0.5000 mg | INTRAMUSCULAR | Status: DC | PRN
  Administered 2024-05-25: 1 mg via INTRAVENOUS
  Filled 2024-05-18 (×2): qty 1

## 2024-05-18 MED ORDER — MAGNESIUM OXIDE -MG SUPPLEMENT 400 (240 MG) MG PO TABS
400.0000 mg | ORAL_TABLET | Freq: Every day | ORAL | Status: DC
  Administered 2024-05-19 – 2024-05-26 (×8): 400 mg via ORAL
  Filled 2024-05-18 (×9): qty 1

## 2024-05-18 MED ORDER — METHYLPREDNISOLONE SODIUM SUCC 40 MG IJ SOLR
40.0000 mg | Freq: Two times a day (BID) | INTRAMUSCULAR | Status: DC
  Administered 2024-05-18 – 2024-05-23 (×11): 40 mg via INTRAVENOUS
  Filled 2024-05-18 (×11): qty 1

## 2024-05-18 MED ORDER — OXYBUTYNIN CHLORIDE ER 10 MG PO TB24
10.0000 mg | ORAL_TABLET | Freq: Every day | ORAL | Status: DC
  Administered 2024-05-19 – 2024-05-24 (×6): 10 mg via ORAL
  Filled 2024-05-18 (×6): qty 1

## 2024-05-18 MED ORDER — IPRATROPIUM BROMIDE 0.02 % IN SOLN: 0.5000 mg | Freq: Four times a day (QID) | RESPIRATORY_TRACT | Status: DC | PRN

## 2024-05-18 MED ORDER — OXYCODONE HCL 5 MG PO TABS: 5.0000 mg | ORAL_TABLET | ORAL | Status: DC | PRN

## 2024-05-18 MED ORDER — FLEET ENEMA RE ENEM: 1.0000 | ENEMA | Freq: Once | RECTAL | Status: DC | PRN

## 2024-05-18 MED ORDER — FLORANEX PO PACK
1.0000 g | PACK | Freq: Three times a day (TID) | ORAL | Status: DC
  Administered 2024-05-19 – 2024-05-26 (×23): 1 g via ORAL
  Filled 2024-05-18 (×34): qty 1

## 2024-05-18 MED ORDER — OSELTAMIVIR PHOSPHATE 30 MG PO CAPS
30.0000 mg | ORAL_CAPSULE | Freq: Every day | ORAL | Status: AC
  Administered 2024-05-18 – 2024-05-22 (×5): 30 mg via ORAL
  Filled 2024-05-18 (×5): qty 1

## 2024-05-18 MED ORDER — METOPROLOL TARTRATE 25 MG PO TABS
25.0000 mg | ORAL_TABLET | Freq: Two times a day (BID) | ORAL | Status: DC
  Administered 2024-05-18 – 2024-05-23 (×10): 25 mg via ORAL
  Filled 2024-05-18 (×10): qty 1

## 2024-05-18 MED ORDER — ACETAMINOPHEN 325 MG PO TABS
650.0000 mg | ORAL_TABLET | Freq: Four times a day (QID) | ORAL | Status: DC | PRN
  Administered 2024-05-19: 325 mg via ORAL
  Filled 2024-05-18 (×2): qty 2

## 2024-05-18 MED ORDER — DONEPEZIL HCL 5 MG PO TABS
5.0000 mg | ORAL_TABLET | Freq: Every day | ORAL | Status: AC
  Administered 2024-05-18 – 2024-05-28 (×11): 5 mg via ORAL
  Filled 2024-05-18 (×11): qty 1

## 2024-05-18 MED ORDER — BUDESONIDE 0.5 MG/2ML IN SUSP
0.5000 mg | Freq: Two times a day (BID) | RESPIRATORY_TRACT | Status: DC
  Administered 2024-05-18 – 2024-05-29 (×22): 0.5 mg via RESPIRATORY_TRACT
  Filled 2024-05-18 (×25): qty 2

## 2024-05-18 MED ORDER — ONDANSETRON HCL 4 MG PO TABS: 4.0000 mg | ORAL_TABLET | Freq: Four times a day (QID) | ORAL | Status: DC | PRN

## 2024-05-18 MED ORDER — SODIUM CHLORIDE 0.9% FLUSH
3.0000 mL | Freq: Two times a day (BID) | INTRAVENOUS | Status: DC
  Administered 2024-05-18 – 2024-05-30 (×23): 3 mL via INTRAVENOUS

## 2024-05-18 MED ORDER — GUAIFENESIN 100 MG/5ML PO LIQD
10.0000 mL | Freq: Three times a day (TID) | ORAL | Status: DC
  Administered 2024-05-18 – 2024-05-28 (×25): 10 mL via ORAL
  Filled 2024-05-18: qty 15
  Filled 2024-05-18 (×10): qty 10
  Filled 2024-05-18: qty 15
  Filled 2024-05-18: qty 10
  Filled 2024-05-18 (×3): qty 15
  Filled 2024-05-18: qty 10
  Filled 2024-05-18: qty 15
  Filled 2024-05-18 (×3): qty 10
  Filled 2024-05-18: qty 15
  Filled 2024-05-18 (×2): qty 10
  Filled 2024-05-18 (×2): qty 15
  Filled 2024-05-18 (×3): qty 10

## 2024-05-18 MED ORDER — IPRATROPIUM BROMIDE 0.02 % IN SOLN: 0.5000 mg | Freq: Four times a day (QID) | RESPIRATORY_TRACT | Status: DC

## 2024-05-18 MED ORDER — SENNOSIDES-DOCUSATE SODIUM 8.6-50 MG PO TABS
1.0000 | ORAL_TABLET | Freq: Every evening | ORAL | Status: DC | PRN
  Administered 2024-05-23: 1 via ORAL
  Filled 2024-05-18: qty 1

## 2024-05-18 MED ORDER — LEVOTHYROXINE SODIUM 50 MCG PO TABS
50.0000 ug | ORAL_TABLET | ORAL | Status: DC
  Administered 2024-05-19: 50 ug via ORAL
  Filled 2024-05-18: qty 1

## 2024-05-18 MED ORDER — TRAZODONE HCL 50 MG PO TABS
25.0000 mg | ORAL_TABLET | Freq: Every evening | ORAL | Status: DC | PRN
  Administered 2024-05-25 – 2024-05-26 (×2): 25 mg via ORAL
  Filled 2024-05-18 (×2): qty 1

## 2024-05-18 MED ORDER — EZETIMIBE 10 MG PO TABS
10.0000 mg | ORAL_TABLET | Freq: Every day | ORAL | Status: DC
  Administered 2024-05-19 – 2024-05-26 (×8): 10 mg via ORAL
  Filled 2024-05-18 (×9): qty 1

## 2024-05-18 MED ORDER — BISACODYL 5 MG PO TBEC: 5.0000 mg | DELAYED_RELEASE_TABLET | Freq: Every day | ORAL | Status: DC | PRN

## 2024-05-18 MED ORDER — LATANOPROST 0.005 % OP SOLN
1.0000 [drp] | Freq: Every day | OPHTHALMIC | Status: DC
  Administered 2024-05-18 – 2024-05-26 (×8): 1 [drp] via OPHTHALMIC
  Filled 2024-05-18 (×3): qty 2.5

## 2024-05-18 MED ORDER — ACETAMINOPHEN 650 MG RE SUPP: 650.0000 mg | Freq: Four times a day (QID) | RECTAL | Status: DC | PRN

## 2024-05-18 MED ORDER — VITAMIN C 500 MG PO TABS
500.0000 mg | ORAL_TABLET | Freq: Every day | ORAL | Status: DC
  Administered 2024-05-18 – 2024-05-26 (×9): 500 mg via ORAL
  Filled 2024-05-18 (×10): qty 1

## 2024-05-18 MED ORDER — SODIUM CHLORIDE 0.9 % IV SOLN: 500.0000 mg | Freq: Once | INTRAVENOUS | Status: DC

## 2024-05-18 MED ORDER — LACTATED RINGERS IV SOLN: INTRAVENOUS | Status: AC

## 2024-05-18 MED ORDER — LORATADINE 10 MG PO TABS: 10.0000 mg | ORAL_TABLET | Freq: Every day | ORAL | Status: DC

## 2024-05-18 MED ORDER — PANTOPRAZOLE SODIUM 40 MG PO TBEC
40.0000 mg | DELAYED_RELEASE_TABLET | Freq: Every day | ORAL | Status: DC
  Administered 2024-05-18 – 2024-05-26 (×9): 40 mg via ORAL
  Filled 2024-05-18 (×11): qty 1

## 2024-05-18 MED ORDER — INSULIN GLARGINE 100 UNIT/ML ~~LOC~~ SOLN
25.0000 [IU] | Freq: Every day | SUBCUTANEOUS | Status: AC
  Administered 2024-05-18 – 2024-05-20 (×3): 25 [IU] via SUBCUTANEOUS
  Filled 2024-05-18 (×3): qty 0.25

## 2024-05-18 NOTE — H&P (Signed)
 " History and Physical   Patient: Hannah [name]                            PCP: Sun, Vyvyan, MD                    DOB: 1932-12-14            DOA: 05/17/2024 FMW:[id number]             DOS: 05/18/2024, 2:26 PM  Sun, Vyvyan, MD  Patient coming from:   HOME  I have personally reviewed patient's medical records, in electronic medical records, including:  [facility], and care everywhere.    Chief Complaint:   Chief Complaint  Patient presents with   Shortness of Breath   Altered Mental Status    History of present illness:    Transfer on admission for [facility]  [name] is a 89 year old female with extensive history of CHF, HFpEF, HTN, COPD, hypothyroidism, DM II, CKD 3A, presented to ED with chief complaint of increasing shortness of breath, cough and fatigue breath x 2 days.  Bilateral lower extremity swelling.  Extensive generalized weakness and confusion unable to get out of bed now. Patient shortness of breath progressively had gotten worse in ED, for 1 L to BiPAP.  Labs: WBC 26.6, creatinine 1.65, glucose 230, bicarb 21, anion gap 21, VBG pCO2 21, normal bicarb 25, normal pH, Troponin 66, lactic acid 3.8>>> 6.2  >> 1.5 CXR: Chronic hyperinflation, bronchial thickening CT Chest: Scattered clustered ground glass nodular densities with tree-in-bud appearance-cholelithiasis, aortic atherosclerosis - Flu Enza A positive   In ED received bolus of LR, DuoNeb, antibiotics Rocephin , azithromycin Also concern for CHF, Remain on BiPAP, tolerating   Was excepted from med Central progressive unit Working diagnosis sepsis pneumonia secondary to influenza A, with acute respiratory failure, AKI Elevated troponin due to ischemic demand.     Patient Denies having: Chest Pain, Abd pain, N/V/D, headache, dizziness, lightheadedness,  Dysuria, Joint pain, rash, open wounds    Review of Systems: As per HPI, otherwise 10 point review of systems were negative.    ----------------------------------------------------------------------------------------------------------------------  Allergies[1]  Home MEDs:  Prior to Admission medications  Medication Sig Start Date End Date Taking? Authorizing Provider  BAQSIMI ONE PACK 3 MG/DOSE POWD 1 spray by Other route See admin instructions. Use 1 spray in 1 nostril once as directed for severe low blood sugar (hypoglycemia). Administer 1 spray into affected nostril(s) as needed for hypoglycemic emergencies. 03/09/24  Yes [provider]  acetaminophen  (TYLENOL ) 325 MG tablet Take 650 mg by mouth every 6 (six) hours as needed for mild pain.     [provider]  albuterol  (PROVENTIL ) (2.5 MG/3ML) 0.083% nebulizer solution Take 3 mLs (2.5 mg total) by nebulization every 4 (four) hours as needed for wheezing or shortness of breath. 04/09/24 04/09/25  Horton, Roxie HERO, DO  albuterol  (VENTOLIN  HFA) 108 (90 Base) MCG/ACT inhaler Inhale 2 puffs into the lungs 3 (three) times daily for 3 days, THEN 2 puffs every 6 (six) hours as needed for wheezing or shortness of breath. 12/28/23 12/30/24  Hattar, Zola SAILOR, MD  amLODipine  (NORVASC ) 2.5 MG tablet Take 1 tablet (2.5 mg total) by mouth daily. 12/04/23   Sebastian Toribio GAILS, MD  arformoterol  (BROVANA ) 15 MCG/2ML NEBU Take 2 mLs (15 mcg total) by nebulization 2 (two) times daily. 02/17/24   Hattar, Zola SAILOR, MD  budesonide  (PULMICORT ) 0.5 MG/2ML nebulizer solution Take 2 mLs (0.5 mg total) by nebulization in the morning and at bedtime. 01/01/24   Hattar, Zola SAILOR, MD  Ca Carbonate-Mag Hydroxide 1000-200 MG CHEW Chew by mouth.    [provider]  CARBONYL IRON  PO Take 1 tablet by mouth daily.    [provider]  Cholecalciferol  (VITAMIN D -3) 125 MCG (5000 UT) TABS Take 5,000 Units by mouth daily.    [provider]  Coenzyme Q10 (CO Q 10 PO) Take 1 capsule by mouth daily.    [provider]  Continuous Glucose Sensor (FREESTYLE LIBRE  14 DAY SENSOR) MISC Inject 1 Device into the skin every 14 (fourteen) days. 11/18/23   [provider]  Cyanocobalamin  (B-12 SL) Place 1 tablet under the tongue daily.    [provider]  diclofenac  sodium (VOLTAREN ) 1 % GEL Apply 2 g topically 3 (three) times daily as needed (joint pain- affected areas).    [provider]  donepezil  (ARICEPT ) 5 MG tablet Take 5 mg by mouth at bedtime.    [provider]  doxycycline  (VIBRAMYCIN ) 100 MG capsule Take 1 capsule (100 mg total) by mouth 2 (two) times daily. 04/09/24   Horton, Roxie M, DO  ezetimibe -simvastatin  (VYTORIN ) 10-20 MG per tablet Take 1 tablet by mouth at bedtime.    [provider]  Finerenone  (KERENDIA ) 10 MG TABS Take 1 tablet (10 mg total) by mouth daily. 03/09/24   Carlin Delon BROCKS, NP  furosemide  (LASIX ) 20 MG tablet TAKE 2 TO 3 TIMES WEEKLY 03/09/24   Carlin Delon BROCKS, NP  HYDROcodone -acetaminophen  (NORCO/VICODIN) 5-325 MG tablet Take 1 tablet by mouth See admin instructions. Take 1 tablet by mouth with lunch and in the evening 10/24/23   [provider]  insulin  glargine (LANTUS  SOLOSTAR) 100 UNIT/ML Solostar Pen Inject 20 Units into the skin at bedtime. 12/04/23   Sebastian Toribio GAILS, MD  insulin  lispro (HUMALOG) 100 UNIT/ML KwikPen Inject 2-12 Units into the skin See admin instructions. Inject 2-12 units into the skin three times a day with meals, per sliding scale    [provider]  JANUVIA 50 MG tablet Take 50 mg by mouth daily.    [provider]  latanoprost  (XALATAN ) 0.005 % ophthalmic solution Place 1 drop into both eyes at bedtime. 02/20/21   [provider]  levothyroxine  (SYNTHROID ) 50 MCG tablet Take 1 tablet (50 mcg total) by mouth See admin instructions. Take 25mcg alternating 50mcg every other day. 03/03/21   Gherghe, Costin M, MD  lidocaine  (XYLOCAINE ) 2 % solution Use as directed 3 mLs in the mouth or throat daily as needed for mouth pain.  04/28/18   [provider]  loratadine  (CLARITIN ) 10 MG tablet Take 1 tablet (10 mg total) by mouth daily. 12/04/23   Sebastian Toribio GAILS, MD  magnesium  oxide (MAG-OX) 400 (240 Mg) MG tablet Take 400 mg by mouth daily.    [provider]  megestrol  (MEGACE ) 400 MG/10ML suspension Take 600 mg by mouth daily before supper.    [provider]  metoprolol  tartrate (LOPRESSOR ) 25 MG tablet Take 1 tablet (25 mg total) by mouth 2 (two) times daily. 03/03/21   Gherghe, Costin M, MD  omeprazole  (PRILOSEC) 20 MG capsule Take 1 capsule (20 mg total) by mouth every other day. 12/04/23   Sebastian Toribio GAILS, MD  ondansetron  (ZOFRAN ) 4 MG tablet Take 1 tablet (4 mg total) by mouth every 8 (eight) hours as needed for nausea  or vomiting. 02/15/23   Jerrol Agent, MD  oxybutynin (DITROPAN-XL) 10 MG 24 hr tablet Take 10 mg by mouth daily. 02/12/24   [provider]  predniSONE  (DELTASONE ) 10 MG tablet Take 2 tablets (20 mg total) by mouth daily. 04/09/24   Horton, Roxie M, DO  simethicone  (MYLICON) 80 MG chewable tablet Chew 1 tablet (80 mg total) by mouth every 6 (six) hours as needed for flatulence. 12/03/23   Sebastian Toribio GAILS, MD  Vibegron (GEMTESA) 75 MG TABS Take 75 mg by mouth daily.    [provider]    PRN MEDs: acetaminophen  **OR** acetaminophen , hydrALAZINE , HYDROmorphone (DILAUDID) injection, ondansetron  **OR** ondansetron  (ZOFRAN ) IV, oxyCODONE , senna-docusate, traZODone  Past Medical History:  Diagnosis Date   COVID-19    Depression    Diabetes mellitus    Hypercholesteremia    Hypertension    Hypothyroidism    Oral cancer (HCC) 2012   surgery and radiation   Spinal stenosis    Tuberculosis    1978   Weight loss, unintentional     Past Surgical History:  Procedure Laterality Date   AORTIC ARCH ANGIOGRAPHY  09/2019   BACK SURGERY     COLONOSCOPY     EYE SURGERY Bilateral    cataracts   LIPOMA EXCISION     back   LUMBAR  LAMINECTOMY/DECOMPRESSION MICRODISCECTOMY Right 04/12/2018   Procedure: Laminectomy and Foraminotomy - Lumbar four-Lumbar five - Lumbar five-Sacral one - right;  Surgeon: Joshua Alm RAMAN, MD;  Location: [facility] OR;  Service: Neurosurgery;  Laterality: Right;   LUMBAR SPINE SURGERY  05/2019   MASS EXCISION Left 04/02/2019   Procedure: EXCISION OF LIPOMATOUS MASS OF THE LEFT BACK;  Surgeon: Gladis Cough, MD;  Location: [facility];  Service: General;  Laterality: Left;   OTHER SURGICAL HISTORY     squamous cell removal;skin graft   RIGHT/LEFT HEART CATH AND CORONARY ANGIOGRAPHY N/A 09/18/2019   Procedure: RIGHT/LEFT HEART CATH AND CORONARY ANGIOGRAPHY;  Surgeon: Ladona Heinz, MD;  Location: [facility] INVASIVE [facility];  Service: Cardiovascular;  Laterality: N/A;   TOOTH EXTRACTION     all pulled     reports that she has never smoked. She has been exposed to tobacco smoke. She has never used smokeless tobacco. She reports that she does not drink alcohol and does not use drugs.   Family History  Problem Relation Age of Onset   Arthritis Mother        Late 62s   Heart attack Father        Deceased, 24   Heart disease Brother    Hypertension Other    Hyperlipidemia Other    Diabetes type II Sister    Diabetes Brother     Physical Exam:   Vitals:   05/18/24 0800 05/18/24 1000 05/18/24 1305 05/18/24 1311  BP: 133/68 134/73  (!) 152/93  Pulse: 95 89 80 97  Resp: 17 18 (!) 24 20  Temp:    98.6 F (37 C)  TempSrc:    Axillary  SpO2: 99% 99% 100% 100%  Weight:    41.5 kg  Height:    5' (1.524 m)   Constitutional: NAD, calm, comfortable Eyes: PERRL, lids and conjunctivae normal ENMT: Mucous membranes are moist. Posterior pharynx clear of any exudate or lesions.Normal dentition.  Neck: normal, supple, no masses, no thyromegaly Respiratory: clear to auscultation bilaterally, no wheezing, no crackles. Normal respiratory effort. No accessory muscle use.  Cardiovascular: Regular rate  and rhythm, no murmurs /  rubs / gallops. No extremity edema. 2+ pedal pulses. No carotid bruits.  Abdomen: no tenderness, no masses palpated. No hepatosplenomegaly. Bowel sounds positive.  Musculoskeletal: no clubbing / cyanosis. No joint deformity upper and lower extremities. Good ROM, no contractures. Normal muscle tone.  Neurologic: CN II-XII grossly intact. Sensation intact, DTR normal. Strength 5/5 in all 4.  Psychiatric: Normal judgment and insight. Alert and oriented x 3. Normal mood.  Skin: no rashes, lesions, ulcers. No induration Decubitus/ulcers:  Wounds: per nursing documentation         Labs on admission:    I have personally reviewed following labs and imaging studies  CBC: Recent Labs  Lab 05/17/24 1721 05/17/24 1728 05/18/24 0231 05/18/24 0315  WBC 12.6*  --   --  12.7*  HGB 12.6 13.6 10.5* 10.3*  HCT 39.1 40.0 31.0* 31.0*  MCV 90.1  --   --  89.1  PLT 268  --   --  226   Basic Metabolic Panel: Recent Labs  Lab 05/17/24 1721 05/17/24 1728 05/17/24 2118 05/18/24 0231 05/18/24 0315  NA 138 135 137 139 140  K 4.8 5.6* 4.2 4.0 4.3  CL 96*  --  99  --  102  CO2 21*  --  21*  --  25  GLUCOSE 330*  --  496*  --  291*  BUN 28*  --  26*  --  22  CREATININE 1.69*  --  1.46*  --  1.26*  CALCIUM  9.6  --  8.3*  --  8.8*   GFR: Estimated Creatinine Clearance: 19.8 mL/min (A) (by C-G formula based on SCr of 1.26 mg/dL (H)). Liver Function Tests: Recent Labs  Lab 05/18/24 0315  AST 47*  ALT 31  ALKPHOS 49  BILITOT <0.2  PROT 5.5*  ALBUMIN 3.2*   No results for input(s): LIPASE, AMYLASE in the last 168 hours. No results for input(s): AMMONIA in the last 168 hours. Coagulation Profile: No results for input(s): INR, PROTIME in the last 168 hours. Cardiac Enzymes: No results for input(s): CKTOTAL, CKMB, CKMBINDEX, TROPONINI in the last 168 hours. BNP (last 3 results) Recent Labs    03/02/24 0817 04/09/24 0915 05/17/24 1739   PROBNP 237 268.0 453.0*   HbA1C: No results for input(s): HGBA1C in the last 72 hours. CBG: Recent Labs  Lab 05/17/24 2108 05/18/24 0314 05/18/24 0528 05/18/24 0826 05/18/24 1309  GLUCAP 457* 269* 212* 130* 107*    Urine analysis:    Component Value Date/Time   COLORURINE YELLOW 05/17/2024 2054   APPEARANCEUR CLEAR 05/17/2024 2054   LABSPEC 1.015 05/17/2024 2054   PHURINE 7.5 05/17/2024 2054   GLUCOSEU 500 (A) 05/17/2024 2054   HGBUR NEGATIVE 05/17/2024 2054   BILIRUBINUR NEGATIVE 05/17/2024 2054   KETONESUR NEGATIVE 05/17/2024 2054   PROTEINUR TRACE (A) 05/17/2024 2054   UROBILINOGEN 0.2 09/27/2012 1638   NITRITE POSITIVE (A) 05/17/2024 2054   LEUKOCYTESUR TRACE (A) 05/17/2024 2054    Last A1C:  Lab Results  Component Value Date   HGBA1C 10.2 (H) 11/27/2023     Radiologic Exams on Admission:   CT Chest Wo Contrast Result Date: 05/17/2024 CLINICAL DATA:  Shortness of breath and cough. EXAM: CT CHEST WITHOUT CONTRAST TECHNIQUE: Multidetector CT imaging of the chest was performed following the standard protocol without IV contrast. RADIATION DOSE REDUCTION: This exam was performed according to the departmental dose-optimization program which includes automated exposure control, adjustment of the mA and/or kV according to patient size and/or use of iterative  reconstruction technique. COMPARISON:  Chest CT dated 04/09/2024. FINDINGS: Evaluation of this exam is limited in the absence of intravenous contrast. Cardiovascular: There is no cardiomegaly. Trace pericardial effusion. There is mild atherosclerotic calcification of the thoracic aorta. No aneurysmal dilatation. The central pulmonary arteries are grossly unremarkable. Mediastinum/Nodes: No hilar or mediastinal adenopathy. The esophagus is grossly unremarkable. No mediastinal fluid collection. Lungs/Pleura: Linear scarring in the right middle lobe appear scattered clusters of ground-glass nodular densities with  tree-in-bud appearance primarily in the lingula and to a lesser degree in the left lower lobe may be residual from prior infectious process or represent recurrent atypical infection. No consolidative changes. There is no pleural effusion or pneumothorax. Right apical scarring. 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 The central airways are patent. Upper Abdomen: Gallstones. Musculoskeletal: Osteopenia.  No acute osseous pathology. IMPRESSION: 1. Scattered clusters of ground-glass nodular densities with tree-in-bud appearance primarily in the lingula and to a lesser degree in the left lower lobe may be residual from prior infectious process or represent recurrent atypical infection. 2. Cholelithiasis. 3.  Aortic Atherosclerosis (ICD10-I70.0). Electronically Signed   By: Vanetta Chou M.D.   On: 05/17/2024 18:35   DG Chest Port 1 View Result Date: 05/17/2024 CLINICAL DATA:  Chest pain. EXAM: PORTABLE CHEST 1 VIEW COMPARISON:  04/09/2024 radiographs and CT FINDINGS: The heart is normal in size. Mediastinal contours are stable with aortic atherosclerosis. Chronic hyperinflation and bronchial thickening. Improvement in bibasilar aeration from prior. No progressive airspace disease. Right costophrenic angle not included in the field of view. No pneumothorax or large pleural effusion. On limited assessment, no acute osseous findings. IMPRESSION: Chronic hyperinflation and bronchial thickening. Improvement in bibasilar aeration from prior. No progressive airspace disease. Electronically Signed   By: Andrea Gasman M.D.   On: 05/17/2024 17:53    EKG:   Independently reviewed.  Orders placed or performed during the hospital encounter of 05/17/24   ED EKG   ED EKG   EKG 12-Lead   EKG 12-Lead   EKG   EKG   EKG   EKG   EKG 12-Lead   ---------------------------------------------------------------------------------------------------------------------------------------    Assessment / Plan:   Principal Problem:    Sepsis due to pneumonia [facility]) Active Problems:   Acute hypoxic respiratory failure (HCC)   Influenza A with pneumonia   Chronic obstructive asthma (HCC)   Hypertension   Insulin  dependent type 2 diabetes mellitus (HCC)   CKD stage 3a, GFR 45-59 ml/min (HCC)   Iron  deficiency anemia   UTI (urinary tract infection)   Hypothyroidism   Assessment and Plan: * Sepsis due to pneumonia (HCC) Sepsis secondary to influenza induced pneumonia  With lactic acidosis and acute respiratory failure -Patient started on broad-spectrum antibiotics of cefepime  and vancomycin  will be continued  -expecting to narrow down antibiotics in next 24-48 hours -Following with the blood and sputum cultures accordingly -Much improved lactic acidosis 6.2, 3.3, 3.4, 1.7,>> 1.5 -WBC 12.7,  Influenza A with pneumonia - Continue with treat with Tamiflu - Supportive therapy, nebs, IV steroids,  Acute hypoxic respiratory failure (HCC) Secondary to sepsis-influenz A induced pneumonia -Was placed on BiPAP -  continue DuoNeb bronchodilators, along with IV steroids, -Continue supportive therapy, with a goal to wean to room air -Maintaining O2 sat greater 94% -Continue to encourage pulmonary toiletry, along with incentive spirometer, flutter valve  UTI (urinary tract infection) - UA; was obtained reviewed, contains 500 glucose, trace of leukocytes, nitrites, rare bacteria WBC 21-50 -Current broad-spectrum antibiotic should cover any urinary tract infection  -  Will follow-up with urine cultures  Iron  deficiency anemia Monitoring H&H, currently stable  CKD stage 3a, GFR 45-59 ml/min (HCC) Acute on chronic CKD 3a Lab Results  Component Value Date   CREATININE 1.26 (H) 05/18/2024   CREATININE 1.46 (H) 05/17/2024   CREATININE 1.69 (H) 05/17/2024  Monitor closely, improving, GFR 34, 41, BUN 26, 22,   Insulin  dependent type 2 diabetes mellitus (HCC) History of diabetes mellitus type 2, in hyperglycemic  state, Anticipating hyperglycemia with IV steroid -Will monitor and check CBG Q ACHS, SSI coverage -Initiating home long-acting insulin   Hypertension Monitoring closely in setting of sepsis -monitor for hypotension -Once stable resuming home medication judicious Medication includes: Lopressor , Lasix , Norvasc   Chronic obstructive asthma (HCC) History of underlying COPD/ asthma -Currently in respiratory failure due to influenza A/pneumonia  Hypothyroidism Will continue home dose Synthroid     Consults called:  none  -------------------------------------------------------------------------------------------------------------------------------------------- DVT prophylaxis:  heparin  injection 5,000 Units Start: 05/18/24 1415 SCDs Start: 05/18/24 1327   Code Status:   Code Status: Full Code   Admission status: Patient will be admitted as Inpatient, with a greater than 2 midnight length of stay. Level of care: Progressive   Family Communication:  none at bedside  (The above findings and plan of care has been discussed with patient in detail, the patient expressed understanding and agreement of above plan)  --------------------------------------------------------------------------------------------------------------------------------------------------  Disposition Plan:  Anticipated 1-2 days Status is: Inpatient Remains inpatient appropriate because: Meeting inpatient criteria as patient is in acute respiratory failure on BiPAP, needing IV antibiotics, breathing support, nebs, IV steroids     ----------------------------------------------------------------------------------------------------------------------------------------------------  Time spent:  20  Min.  Was spent seeing and evaluating the patient, reviewing all medical records, drawn plan of care.  SIGNED: Adriana DELENA Grams, MD, FHM. FAAFP. [facility], Pager  (Please use [email or web address] to page/ or  secure chat through epic) If 7PM-7AM, please contact night-coverage [email or web address],  05/18/2024, 2:26 PM     [1]  Allergies Allergen Reactions   Penicillins Rash and Other (See Comments)    1980's   Sulfa Antibiotics Rash   "

## 2024-05-18 NOTE — Assessment & Plan Note (Addendum)
 Monitoring closely in setting of sepsis -monitor for hypotension -Once stable resuming home medication judicious Medication includes: Lopressor , Lasix , Norvasc 

## 2024-05-18 NOTE — Progress Notes (Signed)
 Pt taken off BiPAP and placed on 3L [place]. Pt tolerating well at this time, vitals stable, SPO2 100%, no increased noted. RN at bedside, BiPAP at bedside in standby.     05/18/24 1305  Therapy Vitals  Pulse Rate 80  Resp (!) 24  MEWS Score/Color  MEWS Score 2  MEWS Score Color Yellow  Respiratory Assessment  Assessment Type Assess only  Respiratory Pattern Regular;Unlabored  Chest Assessment Chest expansion symmetrical  Cough None  Bilateral Breath Sounds Clear;Diminished  Oxygen Therapy/Pulse Ox  O2 Device (S)  Nasal Cannula  SpO2 100 %  O2 Flow Rate (L/min) 3 L/min

## 2024-05-18 NOTE — Assessment & Plan Note (Signed)
 History of diabetes mellitus type 2, in hyperglycemic state, Anticipating hyperglycemia with IV steroid -Will monitor and check CBG Q ACHS, SSI coverage -Initiating home long-acting insulin 

## 2024-05-18 NOTE — ED Notes (Signed)
 [facility] at bedside

## 2024-05-18 NOTE — Progress Notes (Signed)
 PHARMACY NOTE:  ANTIMICROBIAL RENAL DOSAGE ADJUSTMENT  Current antimicrobial regimen includes a mismatch between antimicrobial dosage and estimated renal function.  As per policy approved by the Pharmacy & Therapeutics and Medical Executive Committees, the antimicrobial dosage will be adjusted accordingly.  Current antimicrobial dosage:  Tamiflu 30mg  BID  Indication: +influenza  Renal Function:  Estimated Creatinine Clearance: 19.8 mL/min (A) (by C-G formula based on SCr of 1.26 mg/dL (H)). []      On intermittent HD, scheduled: []      On CRRT    Antimicrobial dosage has been changed to:  30mg  daily x 5 days  Additional comments:   Thank you for allowing pharmacy to be a part of this patient's care.  Rocky Slade, PharmD, BCPS 05/18/2024 6:29 PM

## 2024-05-18 NOTE — Assessment & Plan Note (Addendum)
 Sepsis secondary to influenza induced pneumonia  With lactic acidosis and acute respiratory failure -Patient started on broad-spectrum antibiotics of cefepime  and vancomycin  will be continued  -expecting to narrow down antibiotics in next 24-48 hours -Following with the blood and sputum cultures accordingly -Much improved lactic acidosis 6.2, 3.3, 3.4, 1.7,>> 1.5 -WBC 12.7,

## 2024-05-18 NOTE — Assessment & Plan Note (Signed)
-   UA; was obtained reviewed, contains 500 glucose, trace of leukocytes, nitrites, rare bacteria WBC 21-50 -Current broad-spectrum antibiotic should cover any urinary tract infection  - Will follow-up with urine cultures

## 2024-05-18 NOTE — ED Notes (Signed)
 [facility] with cl called for transport

## 2024-05-18 NOTE — Consult Note (Signed)
 "                                                                  [facility]                                                                                        Patient Identification: Patient Name: Hannah [name] MRN: [id number] Admit Date: 05/17/2024  4:56 PM Today's Date: 05/18/2024 Reason for consult: MSSA bacteremia  Requesting provider: CHAMP autoconsult   Principal Problem:   Sepsis due to pneumonia [facility]) Active Problems:   Chronic obstructive asthma (HCC)   Hypothyroidism   Hypertension   Insulin  dependent type 2 diabetes mellitus (HCC)   CKD stage 3a, GFR 45-59 ml/min (HCC)   Iron  deficiency anemia   Acute hypoxic respiratory failure (HCC)   UTI (urinary tract infection)   Influenza A with pneumonia   Antibiotics:  Azithromycin 1/1 Cefepime  1/1 Ceftriaxone  1/1 Vancomycin  1/1  Lines/Hardware:  Assessment # Acute Hypoxic respiratory failure 2/2 influenza pneumonia complicated with possible secondary MSSA bacterial pneumonia  # MSSA bacteremia 2/2 above   # Mild leukocytosis 2/2 above   # Iron  Def anemia  # AKI on CKD - cr improving   # Hypothyroidism - on levothyroxine    Recommendations  - Continue IV cefazolin  - Complete course of Oseltamivir - 2 sets of repeat blood cultures ordered for tomorrow - Follow-up TTE.  Needs TEE if TTE negative - Monitor for metastatic sites of infection - CBC and BMP monitor - Droplet isolation precautions D/w primary team Dr. Luiz covering this weekend and will follow-up pending studies  Rest of the management as per the primary team. Please call with questions or concerns.  Thank you for the consult  __________________________________________________________________________________________________________ HPI and Hospital Course ( spoke in english as well as hindi) 89 year old female with prior history as below including COVID-19, depression, HTN, HLD, DM2, hypothyroidism, HFpEF, COPD,  CKD oral cancers s/p surgery and radiation, TB in 1978, s/p rt l4-l5 and l5-s1 laminectomy/decompression who presented to the ED on 1/1 with shortness of breath and wheezing despite multiple breathing treatments at home, multiple falls due to lightheadedness and bilateral lower extremity swelling for last 2 days PTA including cough, fatigue, AMS and generalized weakness.  At ED afebrile, tachycardic, wheezing, tachypneic, placed on 2 L nasal cannula Was given DuoNeb IVF, azithromycin and ceftriaxone  Eventually required BiPAP  Labs remarkable for WBC 12.6, creatinine 1.69, BG 330, normal pH, troponin 66, lactic acid 3.8, proBNP 453 Influenza A+ UA with 500 glucose, trace leukocytes, positive nitrite, trace protein, rare bacteria, RBC 0-5, WBC 21-50  1/1 blood cx 1/2 bottles GPC in clusters. BCID MSSA CT chest scattered clustered ground glass nodular densities with tree-in-bud appearance   ROS: General- Denies fever, chills, loss of appetite and loss of weight HEENT - Denies headache, blurry vision, neck pain, sinus pain Chest - Denies any chest  pain CVS- Denies any dizziness/lightheadedness, syncopal attacks, palpitations Abdomen- Denies any nausea, vomiting, abdominal pain, hematochezia and diarrhea Neuro - Denies any weakness, numbness, tingling sensation Psych - Denies any changes in mood irritability or depressive symptoms GU- Denies any burning, dysuria, hematuria or increased frequency of urination Skin - denies any rashes/lesions MSK - denies any joint pain/swelling or restricted ROM. Denies back pain.   Past Medical History:  Diagnosis Date   COVID-19    Depression    Diabetes mellitus    Hypercholesteremia    Hypertension    Hypothyroidism    Oral cancer (HCC) 2012   surgery and radiation   Spinal stenosis    Tuberculosis    1978   Weight loss, unintentional    Past Surgical History:  Procedure Laterality Date   AORTIC ARCH ANGIOGRAPHY  09/2019   BACK SURGERY      COLONOSCOPY     EYE SURGERY Bilateral    cataracts   LIPOMA EXCISION     back   LUMBAR LAMINECTOMY/DECOMPRESSION MICRODISCECTOMY Right 04/12/2018   Procedure: Laminectomy and Foraminotomy - Lumbar four-Lumbar five - Lumbar five-Sacral one - right;  Surgeon: Joshua Alm RAMAN, MD;  Location: [facility] OR;  Service: Neurosurgery;  Laterality: Right;   LUMBAR SPINE SURGERY  05/2019   MASS EXCISION Left 04/02/2019   Procedure: EXCISION OF LIPOMATOUS MASS OF THE LEFT BACK;  Surgeon: Gladis Cough, MD;  Location: [facility];  Service: General;  Laterality: Left;   OTHER SURGICAL HISTORY     squamous cell removal;skin graft   RIGHT/LEFT HEART CATH AND CORONARY ANGIOGRAPHY N/A 09/18/2019   Procedure: RIGHT/LEFT HEART CATH AND CORONARY ANGIOGRAPHY;  Surgeon: Ladona Heinz, MD;  Location: [facility];  Service: Cardiovascular;  Laterality: N/A;   TOOTH EXTRACTION     all pulled    Scheduled Meds:  amLODipine   2.5 mg Oral Daily   ascorbic acid  500 mg Oral Daily   budesonide   0.5 mg Nebulization BID   chlorpheniramine-HYDROcodone   5 mL Oral Q12H   donepezil   5 mg Oral QHS   [START ON 05/19/2024] ezetimibe -simvastatin   1 tablet Oral QHS   guaiFENesin   10 mL Oral Q8H   heparin   5,000 Units Subcutaneous Q8H   insulin  aspart  0-20 Units Subcutaneous TID WC   insulin  glargine  25 Units Subcutaneous QHS   ipratropium-albuterol   3 mL Nebulization Q6H   lactobacillus  1 g Oral TID WC   latanoprost   1 drop Both Eyes QHS   levothyroxine   50 mcg Oral See admin instructions   magnesium  oxide  400 mg Oral Daily   megestrol   600 mg Oral QAC supper   methylPREDNISolone  (SOLU-MEDROL ) injection  40 mg Intravenous Q12H   metoprolol  tartrate  25 mg Oral BID   oseltamivir  30 mg Oral BID   oxybutynin  10 mg Oral Daily   pantoprazole   40 mg Oral Daily   sodium chloride  flush  3 mL Intravenous Q12H   sodium chloride  flush  3 mL Intravenous Q12H   zinc sulfate (50mg  elemental zinc)  220 mg Oral  Daily   Continuous Infusions:   ceFAZolin  (ANCEF ) IV     lactated ringers      lactated ringers      PRN Meds:.acetaminophen  **OR** acetaminophen , hydrALAZINE , HYDROmorphone (DILAUDID) injection, ondansetron  **OR** ondansetron  (ZOFRAN ) IV, oxyCODONE , senna-docusate, traZODone  Allergies[1]  Social History   Socioeconomic History   Marital status: Widowed    Spouse name: Not on file   Number of  children: 4   Years of education: Not on file   Highest education level: Not on file  Occupational History   Not on file  Tobacco Use   Smoking status: Never    Passive exposure: Past   Smokeless tobacco: Never  Vaping Use   Vaping status: Never Used  Substance and Sexual Activity   Alcohol use: No    Alcohol/week: 0.0 standard drinks of alcohol   Drug use: No   Sexual activity: Never  Other Topics Concern   Not on file  Social History Narrative   Lives at home with two daughters.      Social Drivers of Health   Tobacco Use: Low Risk (05/17/2024)   Patient History    Smoking Tobacco Use: Never    Smokeless Tobacco Use: Never    Passive Exposure: Past  Financial Resource Strain: Not on file  Food Insecurity: No Food Insecurity (11/27/2023)   Epic    Worried About Programme Researcher, Broadcasting/film/video in the Last Year: Never true    Ran Out of Food in the Last Year: Never true  Transportation Needs: No Transportation Needs (11/27/2023)   Epic    Lack of Transportation (Medical): No    Lack of Transportation (Non-Medical): No  Physical Activity: Not on file  Stress: Not on file  Social Connections: Unknown (11/27/2023)   Social Connection and Isolation Panel    Frequency of Communication with Friends and Family: Patient unable to answer    Frequency of Social Gatherings with Friends and Family: Patient unable to answer    Attends Religious Services: Patient unable to answer    Active Member of Clubs or Organizations: No    Attends Banker Meetings: Never    Marital Status:  Patient unable to answer  Intimate Partner Violence: Not At Risk (11/27/2023)   Epic    Fear of Current or Ex-Partner: No    Emotionally Abused: No    Physically Abused: No    Sexually Abused: No  Depression (PHQ2-9): Not on file  Alcohol Screen: Not on file  Housing: Low Risk (11/27/2023)   Epic    Unable to Pay for Housing in the Last Year: No    Number of Times Moved in the Last Year: 0    Homeless in the Last Year: No  Utilities: Not At Risk (11/27/2023)   Epic    Threatened with loss of utilities: No  Health Literacy: Not on file   Family History  Problem Relation Age of Onset   Arthritis Mother        Late 90s   Heart attack Father        Deceased, 32   Heart disease Brother    Hypertension Other    Hyperlipidemia Other    Diabetes type II Sister    Diabetes Brother     Vitals BP (!) 152/93 (BP Location: Right Arm)   Pulse 97   Temp 98.6 F (37 C) (Axillary)   Resp 20   Ht 5' (1.524 m)   Wt 41.5 kg   SpO2 100%   BMI 17.87 kg/m    Physical Exam Constitutional: Elderly female lying in the bed    Comments: HEENT WNL, mucosa moist, on nasal cannula  Cardiovascular:     Rate and Rhythm: Normal rate    Heart sounds: s1s2  Pulmonary:     Effort: Pulmonary effort is normal on 3 L nasal cannula    Comments: Bilateral coarse breath sounds  Abdominal:  Palpations: Abdomen is soft.     Tenderness: Nontender and nondistended  Musculoskeletal:        General: No swelling or tenderness in peripheral joints.  No signs of septic joint  Skin:    Comments: No rashes or open wounds  Neurological:     General: Awake, alert, follows basic commands  Psychiatric:        Mood and Affect: Mood normal.    Pertinent Microbiology Results for orders placed or performed during the hospital encounter of 05/17/24  Resp panel by RT-PCR (RSV, Flu A&B, Covid) Anterior Nasal Swab     Status: Abnormal   Collection Time: 05/17/24  5:55 PM   Specimen: Anterior Nasal Swab   Result Value Ref Range Status   SARS Coronavirus 2 by RT PCR NEGATIVE NEGATIVE Final    Comment: (NOTE) SARS-CoV-2 target nucleic acids are NOT DETECTED.  The SARS-CoV-2 RNA is generally detectable in upper respiratory specimens during the acute phase of infection. The lowest concentration of SARS-CoV-2 viral copies this assay can detect is 138 copies/mL. A negative result does not preclude SARS-Cov-2 infection and should not be used as the sole basis for treatment or other patient management decisions. A negative result may occur with  improper specimen collection/handling, submission of specimen other than nasopharyngeal swab, presence of viral mutation(s) within the areas targeted by this assay, and inadequate number of viral copies(<138 copies/mL). A negative result must be combined with clinical observations, patient history, and epidemiological information. The expected result is Negative.  Fact Sheet for Patients:  bloggercourse.com  Fact Sheet for Healthcare Providers:  seriousbroker.it  This test is no t yet approved or cleared by the [place]  FDA and  has been authorized for detection and/or diagnosis of SARS-CoV-2 by FDA under an Emergency Use Authorization (EUA). This EUA will remain  in effect (meaning this test can be used) for the duration of the COVID-19 declaration under Section 564(b)(1) of the Act, 21 U.S.C.section 360bbb-3(b)(1), unless the authorization is terminated  or revoked sooner.       Influenza A by PCR POSITIVE (A) NEGATIVE Final   Influenza B by PCR NEGATIVE NEGATIVE Final    Comment: (NOTE) The Xpert Xpress SARS-CoV-2/FLU/RSV plus assay is intended as an aid in the diagnosis of influenza from Nasopharyngeal swab specimens and should not be used as a sole basis for treatment. Nasal washings and aspirates are unacceptable for Xpert Xpress SARS-CoV-2/FLU/RSV testing.  Fact Sheet for  Patients: bloggercourse.com  Fact Sheet for Healthcare Providers: seriousbroker.it  This test is not yet approved or cleared by the [place]  FDA and has been authorized for detection and/or diagnosis of SARS-CoV-2 by FDA under an Emergency Use Authorization (EUA). This EUA will remain in effect (meaning this test can be used) for the duration of the COVID-19 declaration under Section 564(b)(1) of the Act, 21 U.S.C. section 360bbb-3(b)(1), unless the authorization is terminated or revoked.     Resp Syncytial Virus by PCR NEGATIVE NEGATIVE Final    Comment: (NOTE) Fact Sheet for Patients: bloggercourse.com  Fact Sheet for Healthcare Providers: seriousbroker.it  This test is not yet approved or cleared by the [place]  FDA and has been authorized for detection and/or diagnosis of SARS-CoV-2 by FDA under an Emergency Use Authorization (EUA). This EUA will remain in effect (meaning this test can be used) for the duration of the COVID-19 declaration under Section 564(b)(1) of the Act, 21 U.S.C. section 360bbb-3(b)(1), unless the authorization is terminated or revoked.  Performed at [facility], [address], [place], [place] [address]   Culture, blood (single)     Status: None (Preliminary result)   Collection Time: 05/17/24  7:15 PM   Specimen: BLOOD  Result Value Ref Range Status   Specimen Description   Final    BLOOD BLOOD LEFT FOREARM Performed at [facility], [address] [place], [place] [address]    Special Requests   Final    BOTTLES DRAWN AEROBIC AND ANAEROBIC Blood Culture adequate volume Performed at [facility], [address], [place], [place] [address]    Culture  Setup Time   Final    GRAM POSITIVE COCCI IN CLUSTERS AEROBIC BOTTLE ONLY CRITICAL RESULT CALLED TO, READ BACK BY AND  VERIFIED WITH: MAYA VENETIA FALCON [id number] FCP Performed at [facility], [address], [place], [place] [address]    Culture GRAM POSITIVE COCCI  Final   Report Status PENDING  Incomplete  Blood Culture ID Panel (Reflexed)     Status: Abnormal   Collection Time: 05/17/24  7:15 PM  Result Value Ref Range Status   Enterococcus faecalis NOT DETECTED NOT DETECTED Final   Enterococcus Faecium NOT DETECTED NOT DETECTED Final   Listeria monocytogenes NOT DETECTED NOT DETECTED Final   Staphylococcus species DETECTED (A) NOT DETECTED Final    Comment: CRITICAL RESULT CALLED TO, READ BACK BY AND VERIFIED WITH: PHARMD JEREMY F [id number] FCP    Staphylococcus aureus (BCID) DETECTED (A) NOT DETECTED Final    Comment: CRITICAL RESULT CALLED TO, READ BACK BY AND VERIFIED WITH: PHARMD JEREMY F [id number] FCP    Staphylococcus epidermidis NOT DETECTED NOT DETECTED Final   Staphylococcus lugdunensis NOT DETECTED NOT DETECTED Final   Streptococcus species NOT DETECTED NOT DETECTED Final   Streptococcus agalactiae NOT DETECTED NOT DETECTED Final   Streptococcus pneumoniae NOT DETECTED NOT DETECTED Final   Streptococcus pyogenes NOT DETECTED NOT DETECTED Final   A.calcoaceticus-baumannii NOT DETECTED NOT DETECTED Final   Bacteroides fragilis NOT DETECTED NOT DETECTED Final   Enterobacterales NOT DETECTED NOT DETECTED Final   Enterobacter cloacae complex NOT DETECTED NOT DETECTED Final   Escherichia coli NOT DETECTED NOT DETECTED Final   Klebsiella aerogenes NOT DETECTED NOT DETECTED Final   Klebsiella oxytoca NOT DETECTED NOT DETECTED Final   Klebsiella pneumoniae NOT DETECTED NOT DETECTED Final   Proteus species NOT DETECTED NOT DETECTED Final   Salmonella species NOT DETECTED NOT DETECTED Final   Serratia marcescens NOT DETECTED NOT DETECTED Final   Haemophilus influenzae NOT DETECTED NOT DETECTED Final   Neisseria meningitidis NOT DETECTED NOT DETECTED Final   Pseudomonas aeruginosa  NOT DETECTED NOT DETECTED Final   Stenotrophomonas maltophilia NOT DETECTED NOT DETECTED Final   Candida albicans NOT DETECTED NOT DETECTED Final   Candida auris NOT DETECTED NOT DETECTED Final   Candida glabrata NOT DETECTED NOT DETECTED Final   Candida krusei NOT DETECTED NOT DETECTED Final   Candida parapsilosis NOT DETECTED NOT DETECTED Final   Candida tropicalis NOT DETECTED NOT DETECTED Final   Cryptococcus neoformans/gattii NOT DETECTED NOT DETECTED Final   Meth resistant mecA/C and MREJ NOT DETECTED NOT DETECTED Final    Comment: Performed at [facility], [address] [place], [place] [address]    Pertinent Lab seen by me:    Latest Ref Rng & Units 05/18/2024    3:15 AM 05/18/2024    2:31 AM 05/17/2024    5:28 PM  CBC  WBC 4.0 -  10.5 K/uL 12.7     Hemoglobin 12.0 - 15.0 g/dL 89.6  89.4  86.3   Hematocrit 36.0 - 46.0 % 31.0  31.0  40.0   Platelets 150 - 400 K/uL 226         Latest Ref Rng & Units 05/18/2024    3:15 AM 05/18/2024    2:31 AM 05/17/2024    9:18 PM  CMP  Glucose 70 - 99 mg/dL 708   503   BUN 8 - 23 mg/dL 22   26   Creatinine 9.55 - 1.00 mg/dL 8.73   8.53   Sodium 864 - 145 mmol/L 140  139  137   Potassium 3.5 - 5.1 mmol/L 4.3  4.0  4.2   Chloride 98 - 111 mmol/L 102   99   CO2 22 - 32 mmol/L 25   21   Calcium  8.9 - 10.3 mg/dL 8.8   8.3   Total Protein 6.5 - 8.1 g/dL 5.5     Total Bilirubin 0.0 - 1.2 mg/dL <9.7     Alkaline Phos 38 - 126 U/L 49     AST 15 - 41 U/L 47     ALT 0 - 44 U/L 31       Pertinent Imagings/Other Imagings Plain films and CT images have been personally visualized and interpreted; radiology reports have been reviewed. Decision making incorporated into the Impression / Recommendations.  CT Chest Wo Contrast Result Date: 05/17/2024 CLINICAL DATA:  Shortness of breath and cough. EXAM: CT CHEST WITHOUT CONTRAST TECHNIQUE: Multidetector CT imaging of the chest was performed following the standard protocol without IV contrast. RADIATION  DOSE REDUCTION: This exam was performed according to the departmental dose-optimization program which includes automated exposure control, adjustment of the mA and/or kV according to patient size and/or use of iterative reconstruction technique. COMPARISON:  Chest CT dated 04/09/2024. FINDINGS: Evaluation of this exam is limited in the absence of intravenous contrast. Cardiovascular: There is no cardiomegaly. Trace pericardial effusion. There is mild atherosclerotic calcification of the thoracic aorta. No aneurysmal dilatation. The central pulmonary arteries are grossly unremarkable. Mediastinum/Nodes: No hilar or mediastinal adenopathy. The esophagus is grossly unremarkable. No mediastinal fluid collection. Lungs/Pleura: Linear scarring in the right middle lobe appear scattered clusters of ground-glass nodular densities with tree-in-bud appearance primarily in the lingula and to a lesser degree in the left lower lobe may be residual from prior infectious process or represent recurrent atypical infection. No consolidative changes. There is no pleural effusion or pneumothorax. Right apical scarring. 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 The central airways are patent. Upper Abdomen: Gallstones. Musculoskeletal: Osteopenia.  No acute osseous pathology. IMPRESSION: 1. Scattered clusters of ground-glass nodular densities with tree-in-bud appearance primarily in the lingula and to a lesser degree in the left lower lobe may be residual from prior infectious process or represent recurrent atypical infection. 2. Cholelithiasis. 3.  Aortic Atherosclerosis (ICD10-I70.0). Electronically Signed   By: Vanetta Chou M.D.   On: 05/17/2024 18:35   DG Chest Port 1 View Result Date: 05/17/2024 CLINICAL DATA:  Chest pain. EXAM: PORTABLE CHEST 1 VIEW COMPARISON:  04/09/2024 radiographs and CT FINDINGS: The heart is normal in size. Mediastinal contours are stable with aortic atherosclerosis. Chronic hyperinflation and bronchial thickening.  Improvement in bibasilar aeration from prior. No progressive airspace disease. Right costophrenic angle not included in the field of view. No pneumothorax or large pleural effusion. On limited assessment, no acute osseous findings. IMPRESSION: Chronic hyperinflation and bronchial thickening. Improvement in bibasilar aeration from  prior. No progressive airspace disease. Electronically Signed   By: Andrea Gasman M.D.   On: 05/17/2024 17:53   ECHOCARDIOGRAM COMPLETE Result Date: 05/05/2024    ECHOCARDIOGRAM REPORT   Patient Name:   CLYDENE BURACK Date of Exam: 05/04/2024 Medical Rec #:  [id number]       Height:       61.0 in Accession #:    [id number]      Weight:       99.0 lb Date of Birth:  1932-06-05       BSA:          1.401 m Patient Age:    89 years        BP:           139/67 mmHg Patient Gender: F               HR:           94 bpm. Exam Location:  [address] Procedure: 2D Echo, Cardiac Doppler and Color Doppler (Both Spectral and Color            Flow Doppler were utilized during procedure). Indications:    Dyspnea R06.00  History:        Patient has prior history of Echocardiogram examinations, most                 recent 03/01/2021. Risk Factors:Hypertension and Diabetes.  Sonographer:    Augustin Seals RDCS Referring Phys: [phone] JENNIFER C WOODY IMPRESSIONS  1. Left ventricular ejection fraction, by estimation, is 60 to 65%. Left ventricular ejection fraction by 3D volume is 60 %. The left ventricle has normal function. The left ventricle has no regional wall motion abnormalities. Left ventricular diastolic  parameters are consistent with Grade I diastolic dysfunction (impaired relaxation).  2. Right ventricular systolic function is normal. The right ventricular size is normal. Tricuspid regurgitation signal is inadequate for assessing PA pressure.  3. The mitral valve is degenerative. Mild to moderate mitral valve regurgitation. No evidence of mitral stenosis.  4. The aortic valve is grossly  normal. There is mild calcification of the aortic valve. Aortic valve regurgitation is trivial. No aortic stenosis is present.  5. The inferior vena cava is normal in size with greater than 50% respiratory variability, suggesting right atrial pressure of 3 mmHg. FINDINGS  Left Ventricle: Left ventricular ejection fraction, by estimation, is 60 to 65%. Left ventricular ejection fraction by 3D volume is 60 %. The left ventricle has normal function. The left ventricle has no regional wall motion abnormalities. The left ventricular internal cavity size was normal in size. There is no left ventricular hypertrophy. Left ventricular diastolic parameters are consistent with Grade I diastolic dysfunction (impaired relaxation). Right Ventricle: The right ventricular size is normal. No increase in right ventricular wall thickness. Right ventricular systolic function is normal. Tricuspid regurgitation signal is inadequate for assessing PA pressure. Left Atrium: Left atrial size was normal in size. Right Atrium: Right atrial size was normal in size. Pericardium: Trivial pericardial effusion is present. Presence of epicardial fat layer. Mitral Valve: The mitral valve is degenerative in appearance. Mild to moderate mitral valve regurgitation. No evidence of mitral valve stenosis. Tricuspid Valve: The tricuspid valve is normal in structure. Tricuspid valve regurgitation is trivial. No evidence of tricuspid stenosis. Aortic Valve: The aortic valve is grossly normal. There is mild calcification of the aortic valve. Aortic valve regurgitation is trivial. No aortic stenosis is present. Pulmonic Valve: The pulmonic valve was normal  in structure. Pulmonic valve regurgitation is mild. No evidence of pulmonic stenosis. Aorta: The aortic root is normal in size and structure. Venous: The inferior vena cava is normal in size with greater than 50% respiratory variability, suggesting right atrial pressure of 3 mmHg. IAS/Shunts: No atrial level  shunt detected by color flow Doppler. Additional Comments: 3D was performed not requiring image post processing on an independent workstation and was normal.  LEFT VENTRICLE PLAX 2D LVIDd:         3.80 cm         Diastology LVIDs:         2.20 cm         LV e' medial:    5.22 cm/s LV PW:         0.80 cm         LV E/e' medial:  12.2 LV IVS:        0.90 cm         LV e' lateral:   6.53 cm/s LVOT diam:     1.90 cm         LV E/e' lateral: 9.8 LV SV:         40 LV SV Index:   28 LVOT Area:     2.84 cm        3D Volume EF                                LV 3D EF:    Left                                             ventricul                                             ar                                             ejection                                             fraction                                             by 3D                                             volume is                                             60 %.  3D Volume EF:                                3D EF:        60 %                                LV EDV:       63 ml                                LV ESV:       25 ml                                LV SV:        38 ml RIGHT VENTRICLE             IVC RV Basal diam:  2.60 cm     IVC diam: 1.40 cm RV Mid diam:    1.90 cm RV S prime:     10.40 cm/s  PULMONARY VEINS TAPSE (M-mode): 1.3 cm      A Reversal Velocity: 36.20 cm/s                             Diastolic Velocity:  51.40 cm/s                             S/D Velocity:        1.10                             Systolic Velocity:   56.60 cm/s LEFT ATRIUM           Index       RIGHT ATRIUM          Index LA diam:      2.30 cm 1.64 cm/m  RA Area:     6.90 cm LA Vol (A2C): 10.4 ml 7.42 ml/m  RA Volume:   10.40 ml 7.42 ml/m LA Vol (A4C): 11.7 ml 8.35 ml/m  AORTIC VALVE LVOT Vmax:   75.50 cm/s LVOT Vmean:  49.800 cm/s LVOT VTI:    0.140 m  AORTA Ao Root diam: 2.70 cm Ao Asc diam:  2.40 cm MITRAL VALVE MV Area (PHT): 4.57  cm    SHUNTS MV Decel Time: 166 msec    Systemic VTI:  0.14 m MR Peak grad: 174.2 mmHg   Systemic Diam: 1.90 cm MR Vmax:      660.00 cm/s MV E velocity: 63.90 cm/s MV A velocity: 95.40 cm/s MV E/A ratio:  0.67 Soyla Merck MD Electronically signed by Soyla Merck MD Signature Date/Time: 05/05/2024/3:05:52 PM    Final      I spent 85 minutes involved in face-to-face and non-face-to-face activities for this patient on the day of the visit. Professional time spent includes the following activities: Preparing to see the patient (review of tests), Obtaining and reviewing separately obtained history (admission/discharge record), Performing a medically appropriate examination and evaluation , Ordering medications/labs, referring and communicating with other health care professionals, Documenting clinical information in the EMR, Independently interpreting results (not separately reported), Communicating  results to the patient/family, Counseling and educating the patient/family and Care coordination (not separately reported).  Electronically signed by:   Plan d/w requesting provider as well as ID pharm D  Of note, portions of this note may have been created with voice recognition software. While this note has been edited for accuracy, occasional wrong-word or sound-a-like substitutions may have occurred due to the inherent limitations of voice recognition software.   Annalee Orem, MD Infectious Disease Physician [facility] Pager: [phone]      [1]  Allergies Allergen Reactions   Penicillins Rash and Other (See Comments)    1980's   Sulfa Antibiotics Rash   "

## 2024-05-18 NOTE — Progress Notes (Signed)
 PHARMACY - PHYSICIAN COMMUNICATION CRITICAL VALUE ALERT - BLOOD CULTURE IDENTIFICATION (BCID)  Hannah [name] is an 89 y.o. female who presented to [facility] on 05/17/2024 with a chief complaint of lightheadedness, altered mental status, sepsis  Assessment:  patient found to be positive for influenza A.  Blood culture now positive for GPC, staph aureus on BCID, methicillin resistance not detected.    Name of physician (or Provider) Contacted: Dr. Willette and Dr. Dea notified  Current antibiotics: Vancomycin  and cefepime , also tamiflu for influenza  Changes to prescribed antibiotics recommended: Change antibiotics to cefazolin  and continue tamiflu Recommendations accepted by provider  Results for orders placed or performed during the hospital encounter of 05/17/24  Blood Culture ID Panel (Reflexed) (Collected: 05/17/2024  7:15 PM)  Result Value Ref Range   Enterococcus faecalis NOT DETECTED NOT DETECTED   Enterococcus Faecium NOT DETECTED NOT DETECTED   Listeria monocytogenes NOT DETECTED NOT DETECTED   Staphylococcus species DETECTED (A) NOT DETECTED   Staphylococcus aureus (BCID) DETECTED (A) NOT DETECTED   Staphylococcus epidermidis NOT DETECTED NOT DETECTED   Staphylococcus lugdunensis NOT DETECTED NOT DETECTED   Streptococcus species NOT DETECTED NOT DETECTED   Streptococcus agalactiae NOT DETECTED NOT DETECTED   Streptococcus pneumoniae NOT DETECTED NOT DETECTED   Streptococcus pyogenes NOT DETECTED NOT DETECTED   A.calcoaceticus-baumannii NOT DETECTED NOT DETECTED   Bacteroides fragilis NOT DETECTED NOT DETECTED   Enterobacterales NOT DETECTED NOT DETECTED   Enterobacter cloacae complex NOT DETECTED NOT DETECTED   Escherichia coli NOT DETECTED NOT DETECTED   Klebsiella aerogenes NOT DETECTED NOT DETECTED   Klebsiella oxytoca NOT DETECTED NOT DETECTED   Klebsiella pneumoniae NOT DETECTED NOT DETECTED   Proteus species NOT DETECTED NOT DETECTED   Salmonella species NOT  DETECTED NOT DETECTED   Serratia marcescens NOT DETECTED NOT DETECTED   Haemophilus influenzae NOT DETECTED NOT DETECTED   Neisseria meningitidis NOT DETECTED NOT DETECTED   Pseudomonas aeruginosa NOT DETECTED NOT DETECTED   Stenotrophomonas maltophilia NOT DETECTED NOT DETECTED   Candida albicans NOT DETECTED NOT DETECTED   Candida auris NOT DETECTED NOT DETECTED   Candida glabrata NOT DETECTED NOT DETECTED   Candida krusei NOT DETECTED NOT DETECTED   Candida parapsilosis NOT DETECTED NOT DETECTED   Candida tropicalis NOT DETECTED NOT DETECTED   Cryptococcus neoformans/gattii NOT DETECTED NOT DETECTED   Meth resistant mecA/C and MREJ NOT DETECTED NOT DETECTED    Hannah [name] 05/18/2024  2:52 PM

## 2024-05-18 NOTE — Assessment & Plan Note (Signed)
 Will continue home dose Synthroid 

## 2024-05-18 NOTE — Assessment & Plan Note (Signed)
 Acute on chronic CKD 3a Lab Results  Component Value Date   CREATININE 1.26 (H) 05/18/2024   CREATININE 1.46 (H) 05/17/2024   CREATININE 1.69 (H) 05/17/2024  Monitor closely, improving, GFR 34, 41, BUN 26, 22,

## 2024-05-18 NOTE — Assessment & Plan Note (Signed)
 History of underlying COPD/ asthma -Currently in respiratory failure due to influenza A/pneumonia

## 2024-05-18 NOTE — Assessment & Plan Note (Signed)
 -  Monitoring H&H, currently stable

## 2024-05-18 NOTE — Assessment & Plan Note (Signed)
-   Continue with treat with Tamiflu - Supportive therapy, nebs, IV steroids,

## 2024-05-18 NOTE — ED Notes (Signed)
 RT obtained ABG on pt w/the following results. EDP notified of results. BP settings 10/5 BUR 15 30% at time of ABG collection.  Latest Reference Range & Units 05/18/24 02:31  Sample type  ARTERIAL  pH, Arterial 7.35 - 7.45  7.451 (H)  pCO2 arterial 32 - 48 mmHg 29.4 (L)  pO2, Arterial 83 - 108 mmHg 97  TCO2 22 - 32 mmol/L 21 (L)  Acid-base deficit 0.0 - 2.0 mmol/L 3.0 (H)  Bicarbonate 20.0 - 28.0 mmol/L 20.4  O2 Saturation % 98  Patient temperature  99.1 F    05/18/24 0231  Vitals  Pulse Rate (!) 102  Pulse Rate Source Monitor  ECG Heart Rate (!) 105  Resp 20  Respiratory Pattern Regular;Unlabored;Symmetrical  SpO2 99 %  Oxygen Therapy  O2 Device Bi-PAP  O2 Flow Rate (L/min) 0 L/min  FiO2 (%) 30 %  Patient Activity (if Appropriate) In bed  Pulse Oximetry Type Continuous

## 2024-05-18 NOTE — Assessment & Plan Note (Addendum)
 Secondary to sepsis-influenz A induced pneumonia -Was placed on BiPAP -  continue DuoNeb bronchodilators, along with IV steroids, -Continue supportive therapy, with a goal to wean to room air -Maintaining O2 sat greater 94% -Continue to encourage pulmonary toiletry, along with incentive spirometer, flutter valve

## 2024-05-18 NOTE — Hospital Course (Addendum)
 Hannah [name] is an 89 year old female with past medical history significant for HFpEF, hypertension, COPD, hypothyroidism, chronic kidney disease stage III and type 2 diabetes mellitus who presented with worsening shortness of breath and edema.    She's been diagnosed with influenza, MSSA pneumonia, and MSSA bacteremia.     She's been seen by ID.  Cardiology was c/s for SVT.     She's declined over the past few days. CT with multifocal pneumonia.    Assessment & Plan:   Goals of Care - Hannah [name] and family have had good conversations at length especially with Dr. [name] previously.  Plan is now for transitioning to comfort care and pursuing discharge home with hospice in place   Acute hypoxic respiratory failure Influenza A with MSSA pneumonia MSSA bacteremia Sepsis Repeat CT PE protocol 1/10 with multifocal pneumonia - tree in bud nodular opacities bilaterally, increased from prior exam.  Patchy airspace disease in mid to lower lung filed on L and RLL.  1/1 blood culture with staph aureus and abiotrophia defectiva (suspected contaminant, but follow follow up culture) 1/3 blood culture [id number] Appreciate ID recommendations - continue ancef  while inpatient with goal of 7 days of IV abx and then transition to linezolid  600 mg PO BID with total of 4 weeks of treatment through 06/16/2024. Tamiflu  1/2-1/6 TTE without notable valvular abnormalities - no plan for TEE leukocytosis partially in setting of steroid use, but fever 1/10 and worsening leukocytosis 1/9.   She met criteria for sepsis 1/11 she was transitioned to cefepime  to cover for aspiration pneumonia/HAP.  Holding course with goc conversations. - now s/p GOC discussions, no further abx and focus will be comfort care   Acute Metabolic Encephalopathy Dementia VBG without hypercarbia B12, ammonia, folate all normal Suspect delirium - waxing and waning recently Minimize deliriogenic meds Delirium precautions   Anemia Suspected Hematoma  posterior to Sacrum Drop from 9 -> 7.0 on 1/10  S/p 1 unit pRBC - hb is 8.3 today, more consistent with what I would expect after 1 unit pRBC Hyperdense collection posterior to sacrum in subcutaneous soft tissues measuring 4.5 x 1.6 x 1.2 cm Normal b12, folate.  Elevated ferritin, low iron . Heparin  currently on hold   Left Lower Extremity DVT On 1/8 US  Heparin  gtt on hold with drop in hb and suspected hematoma noted above, plus now focus is comfort/hospice    Intermittent SVT Short intermittent runs increased metop to 37.5 mg BID - will monitor Appreciate cardiology assistance - they've signed off 1/7 (see note)   AKI  Hyperkalemia Received lasix  1/10.  Also pRBC.  Sepsis above as well Improved today with IVF   Hypertension BP on low side Hold amlodipine    Concern for Poor UOP Urinary Retention Now s/p foley placement 1/9 - continue foley for comfort    Possible UTI No urine culture collected CT with bladder wall thickening, perivesicular fat stranding - infectious/inflammatory UA not suggestive of UTI - negative nitrites, negative leukocytes, 0-5 RBC, 6-10 WBC, no bacteria UA at presentation with +LE/nitrites - watch for UTI symptoms - s/p abx    Concern for Possible Dysphagia Thrush SLP eval, appreciate assistance - continue pleasure feeding    HFpEF Echo 05/18/2024 revealed normal left ventricular ejection fraction, with grade 1 diastolic dysfunction Appears euvolemic to dry Cardiology now signed of 1/7   AKI Aki from presentation has improved   Left 2nd Toe Injury Plain films unremarkable Supportive care   Dementia Delirium precautions   T2DM Steroid induced hyperglycemia Steroids  have been d/c'd   Loose watery diarrhea: + c diff antigen, negative PCR.  C diff ruled out with ID assistance, appreciate assistance - suspected abx associated diarrhea   Hypokalemia Hypomagnesemia Hypophosphatemia - repleted    Poor PO intake Severe Protein Calorie  Malnutrition RD Hold megace   Body mass index is 17.22 kg/m. -Continue comfort care   Bilateral LE Pain Described as cramping, unclear cause on 1/9 Seems resolved today palpable bilateral pulses.  No notable masses or visible abnormalities.  Compartments soft.    Heat Rash Suspect this is rash on her bottom Will monitor, supportive care   Severe Protein Calorie Malnutrition Body mass index is 17.22 kg/m.   Concern regarding frequent blood draws.  S/p PICC placement 1/8

## 2024-05-18 NOTE — ED Notes (Signed)
 PT family member reminded of UA specimen and told to let care staff know if the patient is able to provide urine.

## 2024-05-19 DIAGNOSIS — J189 Pneumonia, unspecified organism: Secondary | ICD-10-CM

## 2024-05-19 DIAGNOSIS — A419 Sepsis, unspecified organism: Secondary | ICD-10-CM | POA: Diagnosis not present

## 2024-05-19 LAB — GLUCOSE, CAPILLARY
Glucose-Capillary: 176 mg/dL — ABNORMAL HIGH (ref 70–99)
Glucose-Capillary: 197 mg/dL — ABNORMAL HIGH (ref 70–99)
Glucose-Capillary: 357 mg/dL — ABNORMAL HIGH (ref 70–99)
Glucose-Capillary: 174 mg/dL — ABNORMAL HIGH (ref 70–99)
Glucose-Capillary: 194 mg/dL — ABNORMAL HIGH (ref 70–99)

## 2024-05-19 LAB — BLOOD CULTURE ID PANEL (REFLEXED) - BCID2

## 2024-05-19 LAB — C DIFFICILE QUICK SCREEN W PCR REFLEX
C Diff antigen: POSITIVE — AB
C Diff toxin: NEGATIVE

## 2024-05-19 LAB — BASIC METABOLIC PANEL WITH GFR
Anion gap: 14 (ref 5–15)
BUN: 13 mg/dL (ref 8–23)
CO2: 24 mmol/L (ref 22–32)
Calcium: 8.6 mg/dL — ABNORMAL LOW (ref 8.9–10.3)
Chloride: 99 mmol/L (ref 98–111)
Creatinine, Ser: 1.11 mg/dL — ABNORMAL HIGH (ref 0.44–1.00)
GFR, Estimated: 47 mL/min — ABNORMAL LOW
Glucose, Bld: 236 mg/dL — ABNORMAL HIGH (ref 70–99)
Potassium: 3.5 mmol/L (ref 3.5–5.1)
Sodium: 138 mmol/L (ref 135–145)

## 2024-05-19 LAB — CBC
HCT: 36.5 % (ref 36.0–46.0)
Hemoglobin: 12 g/dL (ref 12.0–15.0)
MCH: 29.6 pg (ref 26.0–34.0)
MCHC: 32.9 g/dL (ref 30.0–36.0)
MCV: 89.9 fL (ref 80.0–100.0)
Platelets: 260 K/uL (ref 150–400)
RBC: 4.06 MIL/uL (ref 3.87–5.11)
RDW: 17.2 % — ABNORMAL HIGH (ref 11.5–15.5)
WBC: 10.3 K/uL (ref 4.0–10.5)
nRBC: 0.2 % (ref 0.0–0.2)

## 2024-05-19 LAB — CLOSTRIDIUM DIFFICILE BY PCR, REFLEXED
Hypervirulent Strain: NEGATIVE
Toxigenic C. Difficile by PCR: NEGATIVE

## 2024-05-19 LAB — APTT: aPTT: 68 s — ABNORMAL HIGH (ref 24–36)

## 2024-05-19 NOTE — Progress Notes (Incomplete)
 " PROGRESS NOTE    Hannah [name]  FMW:[id number] DOB: 1932-12-18 DOA: 05/17/2024 PCP: Sun, Vyvyan, MD  Outpatient Specialists:     Brief Narrative:  Patient is an 89 year old female with documented past medical history significant for chronic heart failure with preserved ejection fraction, hypertension, COPD, hypothyroidism, chronic kidney disease stage III and type 2 diabetes mellitus.  Patient presented with worsening shortness of breath and edema.  No fever or chills.  No GI symptoms prior to presentation (according to the patient's daughter).  Workup done so far showed positive influenza A by PCR, 1 out of 2 blood cultures growing Staphylococcus aureus, UA with positive nitrite, proBNP of 644, magnesium  of 1.6, serum creatinine of 1.1-1.69 (baseline serum creatinine of 1.26) and WBC of 10.3-12.7.  Patient is currently being managed for influenza A with pneumonia, bacteremia/sepsis, possible UTI, acute on chronic kidney disease stage IIIa and acute hypoxic respiratory failure.  05/19/2024: Patient seen.  Patient is not a particularly good historian.  Discussed and updated patient's daughter over the phone.  Patient's daughter reports that patient is currently having diarrheal stools.  Infectious disease input is appreciated.  Patient is currently on IV cefazolin .  Echo revealed normal left ventricular ejection fraction and grade 1 diastolic dysfunction.  Assessment & Plan:   Principal Problem:   Sepsis due to pneumonia [facility]) Active Problems:   Chronic obstructive asthma (HCC)   Hypothyroidism   Hypertension   Insulin  dependent type 2 diabetes mellitus (HCC)   CKD stage 3a, GFR 45-59 ml/min (HCC)   Iron  deficiency anemia   Acute hypoxic respiratory failure (HCC)   UTI (urinary tract infection)   Influenza A with pneumonia   Influenza A with possible pneumonia: - Supportive care. - Continue Tamiflu .  Acute hypoxic respiratory failure: - Resolved significantly.  Possible UTI: -  Cultures.  Possible bacteremia/documented sepsis due to pneumonia: - 1 of 2 blood cultures is growing Staph aureus. - Repeat blood cultures ordered. - Patient is currently on IV Ancef . - Follow culture results. - Infectious disease team input is greatly appreciated.  Possible acute on chronic diastolic CHF: -   DVT prophylaxis: *** Code Status: *** Family Communication: *** Disposition Plan: ***   Consultants:  ***  Procedures:  ***  Antimicrobials:  ***    Subjective: ***  Objective: Vitals:   05/19/24 0438 05/19/24 0819 05/19/24 1311 05/19/24 1519  BP:   (!) 149/82   Pulse:   74   Resp:      Temp:      TempSrc:      SpO2:  100%  93%  Weight: 41.4 kg     Height:        Intake/Output Summary (Last 24 hours) at 05/19/2024 1640 Last data filed at 05/19/2024 0500 Gross per 24 hour  Intake 60 ml  Output 1850 ml  Net -1790 ml   Filed Weights   05/17/24 2051 05/18/24 1311 05/19/24 0438  Weight: 49 kg 41.5 kg 41.4 kg    Examination:  General exam: Appears calm and comfortable  Respiratory system: Clear to auscultation. Respiratory effort normal. Cardiovascular system: S1 & S2 heard, RRR. No JVD, murmurs, rubs, gallops or clicks. No pedal edema. Gastrointestinal system: Abdomen is nondistended, soft and nontender. No organomegaly or masses felt. Normal bowel sounds heard. Central nervous system: Alert and oriented. No focal neurological deficits. Extremities: Symmetric 5 x 5 power. Skin: No rashes, lesions or ulcers Psychiatry: Judgement and insight appear normal. Mood & affect appropriate.  Data Reviewed: I have personally reviewed following labs and imaging studies  CBC: Recent Labs  Lab 05/17/24 1721 05/17/24 1728 05/18/24 0231 05/18/24 0315 05/19/24 0904  WBC 12.6*  --   --  12.7* 10.3  HGB 12.6 13.6 10.5* 10.3* 12.0  HCT 39.1 40.0 31.0* 31.0* 36.5  MCV 90.1  --   --  89.1 89.9  PLT 268  --   --  226 260   Basic Metabolic Panel: Recent  Labs  Lab 05/17/24 1721 05/17/24 1728 05/17/24 2118 05/18/24 0231 05/18/24 0315 05/18/24 1641 05/19/24 0904  NA 138 135 137 139 140  --  138  K 4.8 5.6* 4.2 4.0 4.3  --  3.5  CL 96*  --  99  --  102  --  99  CO2 21*  --  21*  --  25  --  24  GLUCOSE 330*  --  496*  --  291*  --  236*  BUN 28*  --  26*  --  22  --  13  CREATININE 1.69*  --  1.46*  --  1.26*  --  1.11*  CALCIUM  9.6  --  8.3*  --  8.8*  --  8.6*  MG  --   --   --   --   --  1.6*  --   PHOS  --   --   --   --   --  2.7  --    GFR: Estimated Creatinine Clearance: 22.5 mL/min (A) (by C-G formula based on SCr of 1.11 mg/dL (H)). Liver Function Tests: Recent Labs  Lab 05/18/24 0315  AST 47*  ALT 31  ALKPHOS 49  BILITOT <0.2  PROT 5.5*  ALBUMIN 3.2*   No results for input(s): LIPASE, AMYLASE in the last 168 hours. No results for input(s): AMMONIA in the last 168 hours. Coagulation Profile: No results for input(s): INR, PROTIME in the last 168 hours. Cardiac Enzymes: No results for input(s): CKTOTAL, CKMB, CKMBINDEX, TROPONINI in the last 168 hours. BNP (last 3 results) Recent Labs    04/09/24 0915 05/17/24 1739 05/18/24 1641  PROBNP 268.0 453.0* 644.0*   HbA1C: No results for input(s): HGBA1C in the last 72 hours. CBG: Recent Labs  Lab 05/18/24 2036 05/18/24 2347 05/19/24 0433 05/19/24 0817 05/19/24 1223  GLUCAP 176* 261* 176* 197* 357*   Lipid Profile: No results for input(s): CHOL, HDL, LDLCALC, TRIG, CHOLHDL, LDLDIRECT in the last 72 hours. Thyroid  Function Tests: No results for input(s): TSH, T4TOTAL, FREET4, T3FREE, THYROIDAB in the last 72 hours. Anemia Panel: No results for input(s): VITAMINB12, FOLATE, FERRITIN, TIBC, IRON , RETICCTPCT in the last 72 hours. Urine analysis:    Component Value Date/Time   COLORURINE YELLOW 05/17/2024 2054   APPEARANCEUR CLEAR 05/17/2024 2054   LABSPEC 1.015 05/17/2024 2054   PHURINE 7.5 05/17/2024  2054   GLUCOSEU 500 (A) 05/17/2024 2054   HGBUR NEGATIVE 05/17/2024 2054   BILIRUBINUR NEGATIVE 05/17/2024 2054   KETONESUR NEGATIVE 05/17/2024 2054   PROTEINUR TRACE (A) 05/17/2024 2054   UROBILINOGEN 0.2 09/27/2012 1638   NITRITE POSITIVE (A) 05/17/2024 2054   LEUKOCYTESUR TRACE (A) 05/17/2024 2054   Sepsis Labs: @LABRCNTIP (procalcitonin:4,lacticidven:4)  ) Recent Results (from the past 240 hours)  Resp panel by RT-PCR (RSV, Flu A&B, Covid) Anterior Nasal Swab     Status: Abnormal   Collection Time: 05/17/24  5:55 PM   Specimen: Anterior Nasal Swab  Result Value Ref Range Status   SARS Coronavirus 2 by  RT PCR NEGATIVE NEGATIVE Final    Comment: (NOTE) SARS-CoV-2 target nucleic acids are NOT DETECTED.  The SARS-CoV-2 RNA is generally detectable in upper respiratory specimens during the acute phase of infection. The lowest concentration of SARS-CoV-2 viral copies this assay can detect is 138 copies/mL. A negative result does not preclude SARS-Cov-2 infection and should not be used as the sole basis for treatment or other patient management decisions. A negative result may occur with  improper specimen collection/handling, submission of specimen other than nasopharyngeal swab, presence of viral mutation(s) within the areas targeted by this assay, and inadequate number of viral copies(<138 copies/mL). A negative result must be combined with clinical observations, patient history, and epidemiological information. The expected result is Negative.  Fact Sheet for Patients:  bloggercourse.com  Fact Sheet for Healthcare Providers:  seriousbroker.it  This test is no t yet approved or cleared by the [place]  FDA and  has been authorized for detection and/or diagnosis of SARS-CoV-2 by FDA under an Emergency Use Authorization (EUA). This EUA will remain  in effect (meaning this test can be used) for the duration of the COVID-19  declaration under Section 564(b)(1) of the Act, 21 U.S.C.section 360bbb-3(b)(1), unless the authorization is terminated  or revoked sooner.       Influenza A by PCR POSITIVE (A) NEGATIVE Final   Influenza B by PCR NEGATIVE NEGATIVE Final    Comment: (NOTE) The Xpert Xpress SARS-CoV-2/FLU/RSV plus assay is intended as an aid in the diagnosis of influenza from Nasopharyngeal swab specimens and should not be used as a sole basis for treatment. Nasal washings and aspirates are unacceptable for Xpert Xpress SARS-CoV-2/FLU/RSV testing.  Fact Sheet for Patients: bloggercourse.com  Fact Sheet for Healthcare Providers: seriousbroker.it  This test is not yet approved or cleared by the [place]  FDA and has been authorized for detection and/or diagnosis of SARS-CoV-2 by FDA under an Emergency Use Authorization (EUA). This EUA will remain in effect (meaning this test can be used) for the duration of the COVID-19 declaration under Section 564(b)(1) of the Act, 21 U.S.C. section 360bbb-3(b)(1), unless the authorization is terminated or revoked.     Resp Syncytial Virus by PCR NEGATIVE NEGATIVE Final    Comment: (NOTE) Fact Sheet for Patients: bloggercourse.com  Fact Sheet for Healthcare Providers: seriousbroker.it  This test is not yet approved or cleared by the [place]  FDA and has been authorized for detection and/or diagnosis of SARS-CoV-2 by FDA under an Emergency Use Authorization (EUA). This EUA will remain in effect (meaning this test can be used) for the duration of the COVID-19 declaration under Section 564(b)(1) of the Act, 21 U.S.C. section 360bbb-3(b)(1), unless the authorization is terminated or revoked.  Performed at [facility], [address], [place], [place] [address]   Culture, blood (single)     Status: Abnormal (Preliminary result)    Collection Time: 05/17/24  7:15 PM   Specimen: BLOOD LEFT FOREARM  Result Value Ref Range Status   Specimen Description   Final    BLOOD LEFT FOREARM Performed at [facility], [address] [place], [place] [address]    Special Requests   Final    BOTTLES DRAWN AEROBIC AND ANAEROBIC Blood Culture adequate volume Performed at [facility], [address], [place], [place] [address]    Culture  Setup Time   Final    GRAM POSITIVE COCCI IN CLUSTERS AEROBIC BOTTLE ONLY CRITICAL RESULT CALLED TO, READ BACK BY AND VERIFIED WITH: PHARMD JEREMY  F [id number] FCP GRAM POSITIVE COCCI IN CHAINS ANAEROBIC BOTTLE ONLY RBV JESICCA CARNIE PHARMD 05/19/2024 BY DD @ 0134    Culture (A)  Final    STAPHYLOCOCCUS AUREUS SUSCEPTIBILITIES TO FOLLOW Performed at [facility], [address] [place], [place] [address]    Report Status PENDING  Incomplete  Blood Culture ID Panel (Reflexed)     Status: Abnormal   Collection Time: 05/17/24  7:15 PM  Result Value Ref Range Status   Enterococcus faecalis NOT DETECTED NOT DETECTED Final   Enterococcus Faecium NOT DETECTED NOT DETECTED Final   Listeria monocytogenes NOT DETECTED NOT DETECTED Final   Staphylococcus species DETECTED (A) NOT DETECTED Final    Comment: CRITICAL RESULT CALLED TO, READ BACK BY AND VERIFIED WITH: PHARMD JEREMY F [id number] FCP    Staphylococcus aureus (BCID) DETECTED (A) NOT DETECTED Final    Comment: CRITICAL RESULT CALLED TO, READ BACK BY AND VERIFIED WITH: PHARMD JEREMY F 1435 [id number] FCP    Staphylococcus epidermidis NOT DETECTED NOT DETECTED Final   Staphylococcus lugdunensis NOT DETECTED NOT DETECTED Final   Streptococcus species NOT DETECTED NOT DETECTED Final   Streptococcus agalactiae NOT DETECTED NOT DETECTED Final   Streptococcus pneumoniae NOT DETECTED NOT DETECTED Final   Streptococcus pyogenes NOT DETECTED NOT DETECTED Final   A.calcoaceticus-baumannii NOT DETECTED NOT DETECTED  Final   Bacteroides fragilis NOT DETECTED NOT DETECTED Final   Enterobacterales NOT DETECTED NOT DETECTED Final   Enterobacter cloacae complex NOT DETECTED NOT DETECTED Final   Escherichia coli NOT DETECTED NOT DETECTED Final   Klebsiella aerogenes NOT DETECTED NOT DETECTED Final   Klebsiella oxytoca NOT DETECTED NOT DETECTED Final   Klebsiella pneumoniae NOT DETECTED NOT DETECTED Final   Proteus species NOT DETECTED NOT DETECTED Final   Salmonella species NOT DETECTED NOT DETECTED Final   Serratia marcescens NOT DETECTED NOT DETECTED Final   Haemophilus influenzae NOT DETECTED NOT DETECTED Final   Neisseria meningitidis NOT DETECTED NOT DETECTED Final   Pseudomonas aeruginosa NOT DETECTED NOT DETECTED Final   Stenotrophomonas maltophilia NOT DETECTED NOT DETECTED Final   Candida albicans NOT DETECTED NOT DETECTED Final   Candida auris NOT DETECTED NOT DETECTED Final   Candida glabrata NOT DETECTED NOT DETECTED Final   Candida krusei NOT DETECTED NOT DETECTED Final   Candida parapsilosis NOT DETECTED NOT DETECTED Final   Candida tropicalis NOT DETECTED NOT DETECTED Final   Cryptococcus neoformans/gattii NOT DETECTED NOT DETECTED Final   Meth resistant mecA/C and MREJ NOT DETECTED NOT DETECTED Final    Comment: Performed at [facility], [address] [place], [place] [address]  Blood Culture ID Panel (Reflexed)     Status: None   Collection Time: 05/18/24  7:15 PM  Result Value Ref Range Status   Enterococcus faecalis NOT DETECTED NOT DETECTED Final   Enterococcus Faecium NOT DETECTED NOT DETECTED Final   Listeria monocytogenes NOT DETECTED NOT DETECTED Final   Staphylococcus species NOT DETECTED NOT DETECTED Final   Staphylococcus aureus (BCID) NOT DETECTED NOT DETECTED Final   Staphylococcus epidermidis NOT DETECTED NOT DETECTED Final   Staphylococcus lugdunensis NOT DETECTED NOT DETECTED Final   Streptococcus species NOT DETECTED NOT DETECTED Final   Streptococcus  agalactiae NOT DETECTED NOT DETECTED Final   Streptococcus pneumoniae NOT DETECTED NOT DETECTED Final   Streptococcus pyogenes NOT DETECTED NOT DETECTED Final   A.calcoaceticus-baumannii NOT DETECTED NOT DETECTED Final   Bacteroides fragilis NOT DETECTED NOT DETECTED Final   Enterobacterales NOT  DETECTED NOT DETECTED Final   Enterobacter cloacae complex NOT DETECTED NOT DETECTED Final   Escherichia coli NOT DETECTED NOT DETECTED Final   Klebsiella aerogenes NOT DETECTED NOT DETECTED Final   Klebsiella oxytoca NOT DETECTED NOT DETECTED Final   Klebsiella pneumoniae NOT DETECTED NOT DETECTED Final   Proteus species NOT DETECTED NOT DETECTED Final   Salmonella species NOT DETECTED NOT DETECTED Final   Serratia marcescens NOT DETECTED NOT DETECTED Final   Haemophilus influenzae NOT DETECTED NOT DETECTED Final   Neisseria meningitidis NOT DETECTED NOT DETECTED Final   Pseudomonas aeruginosa NOT DETECTED NOT DETECTED Final   Stenotrophomonas maltophilia NOT DETECTED NOT DETECTED Final   Candida albicans NOT DETECTED NOT DETECTED Final   Candida auris NOT DETECTED NOT DETECTED Final   Candida glabrata NOT DETECTED NOT DETECTED Final   Candida krusei NOT DETECTED NOT DETECTED Final   Candida parapsilosis NOT DETECTED NOT DETECTED Final   Candida tropicalis NOT DETECTED NOT DETECTED Final   Cryptococcus neoformans/gattii NOT DETECTED NOT DETECTED Final    Comment: Performed at [facility], [address] [place], [place] [address]         Radiology Studies: ECHOCARDIOGRAM COMPLETE Result Date: 05/18/2024    ECHOCARDIOGRAM REPORT   Patient Name:   Hannah [name] Date of Exam: 05/18/2024 Medical Rec #:  [id number]       Height:       60.0 in Accession #:    [id number]      Weight:       91.5 lb Date of Birth:  Jun 22, 1932       BSA:          1.339 m Patient Age:    89 years        BP:           152/93 mmHg Patient Gender: F               HR:           144 bpm. Exam Location:  Inpatient  Procedure: 2D Echo, Cardiac Doppler and Color Doppler (Both Spectral and Color            Flow Doppler were utilized during procedure). Indications:    CHF- Acute Systolic  History:        Patient has prior history of Echocardiogram examinations, most                 recent 05/04/2024. Risk Factors:Hypertension and Diabetes.  Sonographer:    Sherlean Dubin Referring Phys: [id number] SUBRINA SUNDIL  Sonographer Comments: Image acquisition challenging due to respiratory motion. IMPRESSIONS  1. Left ventricular ejection fraction, by estimation, is 60 to 65%. The left ventricle has normal function. The left ventricle has no regional wall motion abnormalities. Left ventricular diastolic parameters are consistent with Grade I diastolic dysfunction (impaired relaxation).  2. Right ventricular systolic function is normal. The right ventricular size is normal.  3. The mitral valve is normal in structure. Mild mitral valve regurgitation. No evidence of mitral stenosis.  4. The aortic valve is tricuspid. Aortic valve regurgitation is not visualized. No aortic stenosis is present.  5. The inferior vena cava is normal in size with greater than 50% respiratory variability, suggesting right atrial pressure of 3 mmHg. FINDINGS  Left Ventricle: Left ventricular ejection fraction, by estimation, is 60 to 65%. The left ventricle has normal function. The left ventricle has no regional wall motion abnormalities. The left ventricular internal cavity size was normal in  size. There is  no left ventricular hypertrophy. Left ventricular diastolic parameters are consistent with Grade I diastolic dysfunction (impaired relaxation). Right Ventricle: The right ventricular size is normal. No increase in right ventricular wall thickness. Right ventricular systolic function is normal. Left Atrium: Left atrial size was normal in size. Right Atrium: Right atrial size was normal in size. Pericardium: There is no evidence of pericardial effusion. Mitral  Valve: The mitral valve is normal in structure. Mild mitral valve regurgitation. No evidence of mitral valve stenosis. Tricuspid Valve: The tricuspid valve is normal in structure. Tricuspid valve regurgitation is not demonstrated. No evidence of tricuspid stenosis. Aortic Valve: The aortic valve is tricuspid. Aortic valve regurgitation is not visualized. No aortic stenosis is present. Pulmonic Valve: The pulmonic valve was normal in structure. Pulmonic valve regurgitation is not visualized. No evidence of pulmonic stenosis. Aorta: The aortic root is normal in size and structure. Venous: The inferior vena cava is normal in size with greater than 50% respiratory variability, suggesting right atrial pressure of 3 mmHg. IAS/Shunts: No atrial level shunt detected by color flow Doppler.  LEFT VENTRICLE PLAX 2D LVIDd:         3.70 cm     Diastology LVIDs:         2.70 cm     LV e' medial:    8.55 cm/s LV PW:         0.90 cm     LV E/e' medial:  7.4 LV IVS:        0.80 cm     LV e' lateral:   5.28 cm/s LVOT diam:     2.00 cm     LV E/e' lateral: 12.0 LV SV:         36 LV SV Index:   27 LVOT Area:     3.14 cm LV IVRT:       137 msec  LV Volumes (MOD) LV vol d, MOD A2C: 40.7 ml LV vol d, MOD A4C: 32.1 ml LV vol s, MOD A2C: 18.3 ml LV vol s, MOD A4C: 11.4 ml LV SV MOD A2C:     22.4 ml LV SV MOD A4C:     32.1 ml LV SV MOD BP:      22.4 ml RIGHT VENTRICLE             IVC RV Basal diam:  2.70 cm     IVC diam: 0.90 cm RV Mid diam:    2.40 cm RV S prime:     12.60 cm/s TAPSE (M-mode): 1.4 cm LEFT ATRIUM             Index        RIGHT ATRIUM          Index LA diam:        2.10 cm 1.57 cm/m   RA Area:     6.18 cm LA Vol (A2C):   15.5 ml 11.58 ml/m  RA Volume:   10.80 ml 8.07 ml/m LA Vol (A4C):   13.0 ml 9.71 ml/m LA Biplane Vol: 15.6 ml 11.65 ml/m  AORTIC VALVE LVOT Vmax:   65.90 cm/s LVOT Vmean:  40.100 cm/s LVOT VTI:    0.115 m  AORTA Ao Root diam: 2.80 cm Ao Asc diam:  2.70 cm MITRAL VALVE               TRICUSPID VALVE MV  Area (PHT): 4.80 cm    TR Peak grad:   6.4 mmHg MV Decel  Time: 158 msec    TR Vmax:        126.00 cm/s MR Peak grad: 54.3 mmHg MR Vmax:      368.50 cm/s  SHUNTS MV E velocity: 63.10 cm/s  Systemic VTI:  0.12 m MV A velocity: 68.40 cm/s  Systemic Diam: 2.00 cm MV E/A ratio:  0.92 Morene Brownie Electronically signed by Morene Brownie Signature Date/Time: 05/18/2024/4:50:12 PM    Final    CT Chest Wo Contrast Result Date: 05/17/2024 CLINICAL DATA:  Shortness of breath and cough. EXAM: CT CHEST WITHOUT CONTRAST TECHNIQUE: Multidetector CT imaging of the chest was performed following the standard protocol without IV contrast. RADIATION DOSE REDUCTION: This exam was performed according to the departmental dose-optimization program which includes automated exposure control, adjustment of the mA and/or kV according to patient size and/or use of iterative reconstruction technique. COMPARISON:  Chest CT dated 04/09/2024. FINDINGS: Evaluation of this exam is limited in the absence of intravenous contrast. Cardiovascular: There is no cardiomegaly. Trace pericardial effusion. There is mild atherosclerotic calcification of the thoracic aorta. No aneurysmal dilatation. The central pulmonary arteries are grossly unremarkable. Mediastinum/Nodes: No hilar or mediastinal adenopathy. The esophagus is grossly unremarkable. No mediastinal fluid collection. Lungs/Pleura: Linear scarring in the right middle lobe appear scattered clusters of ground-glass nodular densities with tree-in-bud appearance primarily in the lingula and to a lesser degree in the left lower lobe may be residual from prior infectious process or represent recurrent atypical infection. No consolidative changes. There is no pleural effusion or pneumothorax. Right apical scarring. 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 The central airways are patent. Upper Abdomen: Gallstones. Musculoskeletal: Osteopenia.  No acute osseous pathology. IMPRESSION: 1. Scattered clusters of  ground-glass nodular densities with tree-in-bud appearance primarily in the lingula and to a lesser degree in the left lower lobe may be residual from prior infectious process or represent recurrent atypical infection. 2. Cholelithiasis. 3.  Aortic Atherosclerosis (ICD10-I70.0). Electronically Signed   By: Vanetta Chou M.D.   On: 05/17/2024 18:35   DG Chest Port 1 View Result Date: 05/17/2024 CLINICAL DATA:  Chest pain. EXAM: PORTABLE CHEST 1 VIEW COMPARISON:  04/09/2024 radiographs and CT FINDINGS: The heart is normal in size. Mediastinal contours are stable with aortic atherosclerosis. Chronic hyperinflation and bronchial thickening. Improvement in bibasilar aeration from prior. No progressive airspace disease. Right costophrenic angle not included in the field of view. No pneumothorax or large pleural effusion. On limited assessment, no acute osseous findings. IMPRESSION: Chronic hyperinflation and bronchial thickening. Improvement in bibasilar aeration from prior. No progressive airspace disease. Electronically Signed   By: Andrea Gasman M.D.   On: 05/17/2024 17:53        Scheduled Meds:  amLODipine   2.5 mg Oral Daily   ascorbic acid   500 mg Oral Daily   budesonide   0.5 mg Nebulization BID   chlorpheniramine-HYDROcodone   5 mL Oral Q12H   donepezil   5 mg Oral QHS   ezetimibe   10 mg Oral QHS   And   simvastatin   20 mg Oral QHS   guaiFENesin   10 mL Oral Q8H   heparin   5,000 Units Subcutaneous Q8H   insulin  aspart  0-20 Units Subcutaneous TID WC   insulin  glargine  25 Units Subcutaneous QHS   ipratropium-albuterol   3 mL Nebulization Q6H   lactobacillus  1 g Oral TID WC   latanoprost   1 drop Both Eyes QHS   levothyroxine   50 mcg Oral See admin instructions   magnesium  oxide  400 mg Oral Daily  megestrol   600 mg Oral QAC supper   methylPREDNISolone  (SOLU-MEDROL ) injection  40 mg Intravenous Q12H   metoprolol  tartrate  25 mg Oral BID   oseltamivir   30 mg Oral  Daily   oxybutynin   10 mg Oral Daily   pantoprazole   40 mg Oral Daily   sodium chloride  flush  3 mL Intravenous Q12H   sodium chloride  flush  3 mL Intravenous Q12H   zinc  sulfate (50mg  elemental zinc )  220 mg Oral Daily   Continuous Infusions:   ceFAZolin  (ANCEF ) IV 2 g (05/19/24 0440)     LOS: 1 day    Time spent: ***    Leatrice Chapel, MD  [facility] 7PM-7AM contact night coverage as above    "

## 2024-05-19 NOTE — Progress Notes (Incomplete)
 " PROGRESS NOTE    Hannah [name]  FMW:[id number] DOB: 26-Mar-1933 DOA: 05/17/2024 PCP: Sun, Vyvyan, MD  Outpatient Specialists:     Brief Narrative:  Patient is an 89 year old female with documented past medical history significant for chronic heart failure with preserved ejection fraction, hypertension, COPD, hypothyroidism, chronic kidney disease stage III and type 2 diabetes mellitus.  Patient presented with worsening shortness of breath and edema.  No fever or chills.  No GI symptoms prior to presentation (according to the patient's daughter).  Workup done so far showed positive influenza A by PCR, 1 out of 2 blood cultures growing Staphylococcus aureus, UA with positive nitrite, proBNP of 644, magnesium  of 1.6, serum creatinine of 1.1-1.69 (baseline serum creatinine of 1.26) and WBC of 10.3-12.7.  Patient is currently being managed for influenza A with pneumonia, bacteremia/sepsis, possible UTI, acute on chronic kidney disease stage IIIa and acute hypoxic respiratory failure.  05/19/2024: Patient seen.  Patient is not a particularly good historian.  Discussed and updated patient's daughter over the phone.  Patient's daughter reports that patient is currently having diarrheal stools.  Infectious disease input is appreciated.  Patient is currently on IV cefazolin .  Echo revealed normal left ventricular ejection fraction and grade 1 diastolic dysfunction.  Assessment & Plan:   Principal Problem:   Sepsis due to pneumonia [facility]) Active Problems:   Chronic obstructive asthma (HCC)   Hypothyroidism   Hypertension   Insulin  dependent type 2 diabetes mellitus (HCC)   CKD stage 3a, GFR 45-59 ml/min (HCC)   Iron  deficiency anemia   Acute hypoxic respiratory failure (HCC)   UTI (urinary tract infection)   Influenza A with pneumonia   Influenza A with possible pneumonia: - Supportive care. - Continue Tamiflu .  Acute hypoxic respiratory failure: - Resolved significantly.  Possible UTI: -  Cultures.  Possible bacteremia/documented sepsis due to pneumonia: - 1 of 2 blood cultures is growing Staph aureus. - Repeat blood cultures ordered. - Patient is currently on IV Ancef . - Follow culture results. - Infectious disease team input is greatly appreciated.  Possible acute on chronic diastolic CHF: - Echo revealed normal left ventricular ejection fraction, with grade 1 diastolic dysfunction. - Cautious diuresis.  Patient is currently not in respiratory distress. - Manage expectantly.  Dementia: - No behavioral problems. - Continue Aricept .  Watery stools versus diarrhea: - Send stool for analysis.  Hypomagnesemia: - Magnesium  of 1.6. - IV magnesium  2 g x 1 dose. - Continue to monitor renal function and electrolytes.  Follow-up magnesium  level.   DVT prophylaxis:  Code Status: Full code Family Communication: Updated patient's daughter over the phone Disposition Plan:    Consultants:  Infectious disease  Procedures:  None  Antimicrobials:  IV Ancef    Subjective: Patient is a poor historian.  Objective: Vitals:   05/19/24 0438 05/19/24 0819 05/19/24 1311 05/19/24 1519  BP:   (!) 149/82   Pulse:   74   Resp:      Temp:      TempSrc:      SpO2:  100%  93%  Weight: 41.4 kg     Height:        Intake/Output Summary (Last 24 hours) at 05/19/2024 1640 Last data filed at 05/19/2024 0500 Gross per 24 hour  Intake 60 ml  Output 1850 ml  Net -1790 ml   Filed Weights   05/17/24 2051 05/18/24 1311 05/19/24 0438  Weight: 49 kg 41.5 kg 41.4 kg    Examination:  General exam: Appears  calm and comfortable  Respiratory system: Mild expiratory wheeze.   Cardiovascular system: S1 & S2 heard Gastrointestinal system: Abdomen is soft and nontender. Central nervous system: Awake and alert.  Data Reviewed: I have personally reviewed following labs and imaging studies  CBC: Recent Labs  Lab 05/17/24 1721 05/17/24 1728 05/18/24 0231 05/18/24 0315  05/19/24 0904  WBC 12.6*  --   --  12.7* 10.3  HGB 12.6 13.6 10.5* 10.3* 12.0  HCT 39.1 40.0 31.0* 31.0* 36.5  MCV 90.1  --   --  89.1 89.9  PLT 268  --   --  226 260   Basic Metabolic Panel: Recent Labs  Lab 05/17/24 1721 05/17/24 1728 05/17/24 2118 05/18/24 0231 05/18/24 0315 05/18/24 1641 05/19/24 0904  NA 138 135 137 139 140  --  138  K 4.8 5.6* 4.2 4.0 4.3  --  3.5  CL 96*  --  99  --  102  --  99  CO2 21*  --  21*  --  25  --  24  GLUCOSE 330*  --  496*  --  291*  --  236*  BUN 28*  --  26*  --  22  --  13  CREATININE 1.69*  --  1.46*  --  1.26*  --  1.11*  CALCIUM  9.6  --  8.3*  --  8.8*  --  8.6*  MG  --   --   --   --   --  1.6*  --   PHOS  --   --   --   --   --  2.7  --    GFR: Estimated Creatinine Clearance: 22.5 mL/min (A) (by C-G formula based on SCr of 1.11 mg/dL (H)). Liver Function Tests: Recent Labs  Lab 05/18/24 0315  AST 47*  ALT 31  ALKPHOS 49  BILITOT <0.2  PROT 5.5*  ALBUMIN 3.2*   No results for input(s): LIPASE, AMYLASE in the last 168 hours. No results for input(s): AMMONIA in the last 168 hours. Coagulation Profile: No results for input(s): INR, PROTIME in the last 168 hours. Cardiac Enzymes: No results for input(s): CKTOTAL, CKMB, CKMBINDEX, TROPONINI in the last 168 hours. BNP (last 3 results) Recent Labs    04/09/24 0915 05/17/24 1739 05/18/24 1641  PROBNP 268.0 453.0* 644.0*   HbA1C: No results for input(s): HGBA1C in the last 72 hours. CBG: Recent Labs  Lab 05/18/24 2036 05/18/24 2347 05/19/24 0433 05/19/24 0817 05/19/24 1223  GLUCAP 176* 261* 176* 197* 357*   Lipid Profile: No results for input(s): CHOL, HDL, LDLCALC, TRIG, CHOLHDL, LDLDIRECT in the last 72 hours. Thyroid  Function Tests: No results for input(s): TSH, T4TOTAL, FREET4, T3FREE, THYROIDAB in the last 72 hours. Anemia Panel: No results for input(s): VITAMINB12, FOLATE, FERRITIN, TIBC, IRON ,  RETICCTPCT in the last 72 hours. Urine analysis:    Component Value Date/Time   COLORURINE YELLOW 05/17/2024 2054   APPEARANCEUR CLEAR 05/17/2024 2054   LABSPEC 1.015 05/17/2024 2054   PHURINE 7.5 05/17/2024 2054   GLUCOSEU 500 (A) 05/17/2024 2054   HGBUR NEGATIVE 05/17/2024 2054   BILIRUBINUR NEGATIVE 05/17/2024 2054   KETONESUR NEGATIVE 05/17/2024 2054   PROTEINUR TRACE (A) 05/17/2024 2054   UROBILINOGEN 0.2 09/27/2012 1638   NITRITE POSITIVE (A) 05/17/2024 2054   LEUKOCYTESUR TRACE (A) 05/17/2024 2054   Sepsis Labs: @LABRCNTIP (procalcitonin:4,lacticidven:4)  ) Recent Results (from the past 240 hours)  Resp panel by RT-PCR (RSV, Flu A&B, Covid) Anterior Nasal Swab  Status: Abnormal   Collection Time: 05/17/24  5:55 PM   Specimen: Anterior Nasal Swab  Result Value Ref Range Status   SARS Coronavirus 2 by RT PCR NEGATIVE NEGATIVE Final    Comment: (NOTE) SARS-CoV-2 target nucleic acids are NOT DETECTED.  The SARS-CoV-2 RNA is generally detectable in upper respiratory specimens during the acute phase of infection. The lowest concentration of SARS-CoV-2 viral copies this assay can detect is 138 copies/mL. A negative result does not preclude SARS-Cov-2 infection and should not be used as the sole basis for treatment or other patient management decisions. A negative result may occur with  improper specimen collection/handling, submission of specimen other than nasopharyngeal swab, presence of viral mutation(s) within the areas targeted by this assay, and inadequate number of viral copies(<138 copies/mL). A negative result must be combined with clinical observations, patient history, and epidemiological information. The expected result is Negative.  Fact Sheet for Patients:  bloggercourse.com  Fact Sheet for Healthcare Providers:  seriousbroker.it  This test is no t yet approved or cleared by the [place]  FDA and   has been authorized for detection and/or diagnosis of SARS-CoV-2 by FDA under an Emergency Use Authorization (EUA). This EUA will remain  in effect (meaning this test can be used) for the duration of the COVID-19 declaration under Section 564(b)(1) of the Act, 21 U.S.C.section 360bbb-3(b)(1), unless the authorization is terminated  or revoked sooner.       Influenza A by PCR POSITIVE (A) NEGATIVE Final   Influenza B by PCR NEGATIVE NEGATIVE Final    Comment: (NOTE) The Xpert Xpress SARS-CoV-2/FLU/RSV plus assay is intended as an aid in the diagnosis of influenza from Nasopharyngeal swab specimens and should not be used as a sole basis for treatment. Nasal washings and aspirates are unacceptable for Xpert Xpress SARS-CoV-2/FLU/RSV testing.  Fact Sheet for Patients: bloggercourse.com  Fact Sheet for Healthcare Providers: seriousbroker.it  This test is not yet approved or cleared by the [place]  FDA and has been authorized for detection and/or diagnosis of SARS-CoV-2 by FDA under an Emergency Use Authorization (EUA). This EUA will remain in effect (meaning this test can be used) for the duration of the COVID-19 declaration under Section 564(b)(1) of the Act, 21 U.S.C. section 360bbb-3(b)(1), unless the authorization is terminated or revoked.     Resp Syncytial Virus by PCR NEGATIVE NEGATIVE Final    Comment: (NOTE) Fact Sheet for Patients: bloggercourse.com  Fact Sheet for Healthcare Providers: seriousbroker.it  This test is not yet approved or cleared by the [place]  FDA and has been authorized for detection and/or diagnosis of SARS-CoV-2 by FDA under an Emergency Use Authorization (EUA). This EUA will remain in effect (meaning this test can be used) for the duration of the COVID-19 declaration under Section 564(b)(1) of the Act, 21 U.S.C. section 360bbb-3(b)(1),  unless the authorization is terminated or revoked.  Performed at [facility], [address], [place], [place] [address]   Culture, blood (single)     Status: Abnormal (Preliminary result)   Collection Time: 05/17/24  7:15 PM   Specimen: BLOOD LEFT FOREARM  Result Value Ref Range Status   Specimen Description   Final    BLOOD LEFT FOREARM Performed at [facility], [address], [place], [place] [address]    Special Requests   Final    BOTTLES DRAWN AEROBIC AND ANAEROBIC Blood Culture adequate volume Performed at [facility], [address], [place], [place] [address]    Culture  Setup Time   Final    GRAM POSITIVE COCCI IN CLUSTERS AEROBIC BOTTLE ONLY CRITICAL RESULT CALLED TO, READ BACK BY AND VERIFIED WITH: PHARMD JEREMY F 1435 [id number] FCP GRAM POSITIVE COCCI IN CHAINS ANAEROBIC BOTTLE ONLY RBV JESICCA CARNIE PHARMD 05/19/2024 BY DD @ 0134    Culture (A)  Final    STAPHYLOCOCCUS AUREUS SUSCEPTIBILITIES TO FOLLOW Performed at [facility], [address] [place], [place] [address]    Report Status PENDING  Incomplete  Blood Culture ID Panel (Reflexed)     Status: Abnormal   Collection Time: 05/17/24  7:15 PM  Result Value Ref Range Status   Enterococcus faecalis NOT DETECTED NOT DETECTED Final   Enterococcus Faecium NOT DETECTED NOT DETECTED Final   Listeria monocytogenes NOT DETECTED NOT DETECTED Final   Staphylococcus species DETECTED (A) NOT DETECTED Final    Comment: CRITICAL RESULT CALLED TO, READ BACK BY AND VERIFIED WITH: PHARMD JEREMY F 1435 [id number] FCP    Staphylococcus aureus (BCID) DETECTED (A) NOT DETECTED Final    Comment: CRITICAL RESULT CALLED TO, READ BACK BY AND VERIFIED WITH: PHARMD JEREMY F 1435 [id number] FCP    Staphylococcus epidermidis NOT DETECTED NOT DETECTED Final   Staphylococcus lugdunensis NOT DETECTED NOT DETECTED Final   Streptococcus species NOT DETECTED NOT DETECTED Final    Streptococcus agalactiae NOT DETECTED NOT DETECTED Final   Streptococcus pneumoniae NOT DETECTED NOT DETECTED Final   Streptococcus pyogenes NOT DETECTED NOT DETECTED Final   A.calcoaceticus-baumannii NOT DETECTED NOT DETECTED Final   Bacteroides fragilis NOT DETECTED NOT DETECTED Final   Enterobacterales NOT DETECTED NOT DETECTED Final   Enterobacter cloacae complex NOT DETECTED NOT DETECTED Final   Escherichia coli NOT DETECTED NOT DETECTED Final   Klebsiella aerogenes NOT DETECTED NOT DETECTED Final   Klebsiella oxytoca NOT DETECTED NOT DETECTED Final   Klebsiella pneumoniae NOT DETECTED NOT DETECTED Final   Proteus species NOT DETECTED NOT DETECTED Final   Salmonella species NOT DETECTED NOT DETECTED Final   Serratia marcescens NOT DETECTED NOT DETECTED Final   Haemophilus influenzae NOT DETECTED NOT DETECTED Final   Neisseria meningitidis NOT DETECTED NOT DETECTED Final   Pseudomonas aeruginosa NOT DETECTED NOT DETECTED Final   Stenotrophomonas maltophilia NOT DETECTED NOT DETECTED Final   Candida albicans NOT DETECTED NOT DETECTED Final   Candida auris NOT DETECTED NOT DETECTED Final   Candida glabrata NOT DETECTED NOT DETECTED Final   Candida krusei NOT DETECTED NOT DETECTED Final   Candida parapsilosis NOT DETECTED NOT DETECTED Final   Candida tropicalis NOT DETECTED NOT DETECTED Final   Cryptococcus neoformans/gattii NOT DETECTED NOT DETECTED Final   Meth resistant mecA/C and MREJ NOT DETECTED NOT DETECTED Final    Comment: Performed at [facility], [address], [place], [place] [address]  Blood Culture ID Panel (Reflexed)     Status: None   Collection Time: 05/18/24  7:15 PM  Result Value Ref Range Status   Enterococcus faecalis NOT DETECTED NOT DETECTED Final   Enterococcus Faecium NOT DETECTED NOT DETECTED Final   Listeria monocytogenes NOT DETECTED NOT DETECTED Final   Staphylococcus species NOT DETECTED NOT DETECTED Final   Staphylococcus aureus (BCID) NOT  DETECTED NOT DETECTED Final   Staphylococcus epidermidis NOT DETECTED NOT DETECTED Final   Staphylococcus lugdunensis NOT DETECTED NOT DETECTED Final   Streptococcus species NOT DETECTED NOT DETECTED Final   Streptococcus agalactiae NOT DETECTED NOT DETECTED Final   Streptococcus pneumoniae NOT DETECTED NOT DETECTED Final  Streptococcus pyogenes NOT DETECTED NOT DETECTED Final   A.calcoaceticus-baumannii NOT DETECTED NOT DETECTED Final   Bacteroides fragilis NOT DETECTED NOT DETECTED Final   Enterobacterales NOT DETECTED NOT DETECTED Final   Enterobacter cloacae complex NOT DETECTED NOT DETECTED Final   Escherichia coli NOT DETECTED NOT DETECTED Final   Klebsiella aerogenes NOT DETECTED NOT DETECTED Final   Klebsiella oxytoca NOT DETECTED NOT DETECTED Final   Klebsiella pneumoniae NOT DETECTED NOT DETECTED Final   Proteus species NOT DETECTED NOT DETECTED Final   Salmonella species NOT DETECTED NOT DETECTED Final   Serratia marcescens NOT DETECTED NOT DETECTED Final   Haemophilus influenzae NOT DETECTED NOT DETECTED Final   Neisseria meningitidis NOT DETECTED NOT DETECTED Final   Pseudomonas aeruginosa NOT DETECTED NOT DETECTED Final   Stenotrophomonas maltophilia NOT DETECTED NOT DETECTED Final   Candida albicans NOT DETECTED NOT DETECTED Final   Candida auris NOT DETECTED NOT DETECTED Final   Candida glabrata NOT DETECTED NOT DETECTED Final   Candida krusei NOT DETECTED NOT DETECTED Final   Candida parapsilosis NOT DETECTED NOT DETECTED Final   Candida tropicalis NOT DETECTED NOT DETECTED Final   Cryptococcus neoformans/gattii NOT DETECTED NOT DETECTED Final    Comment: Performed at [facility], [address], [place], [place] [address]         Radiology Studies: ECHOCARDIOGRAM COMPLETE Result Date: 05/18/2024    ECHOCARDIOGRAM REPORT   Patient Name:   ELIZAVETA MATTICE Date of Exam: 05/18/2024 Medical Rec #:  [id number]       Height:       60.0 in Accession #:     [id number]      Weight:       91.5 lb Date of Birth:  24-Jan-1933       BSA:          1.339 m Patient Age:    89 years        BP:           152/93 mmHg Patient Gender: F               HR:           144 bpm. Exam Location:  Inpatient Procedure: 2D Echo, Cardiac Doppler and Color Doppler (Both Spectral and Color            Flow Doppler were utilized during procedure). Indications:    CHF- Acute Systolic  History:        Patient has prior history of Echocardiogram examinations, most                 recent 05/04/2024. Risk Factors:Hypertension and Diabetes.  Sonographer:    Sherlean Dubin Referring Phys: [id number] SUBRINA SUNDIL  Sonographer Comments: Image acquisition challenging due to respiratory motion. IMPRESSIONS  1. Left ventricular ejection fraction, by estimation, is 60 to 65%. The left ventricle has normal function. The left ventricle has no regional wall motion abnormalities. Left ventricular diastolic parameters are consistent with Grade I diastolic dysfunction (impaired relaxation).  2. Right ventricular systolic function is normal. The right ventricular size is normal.  3. The mitral valve is normal in structure. Mild mitral valve regurgitation. No evidence of mitral stenosis.  4. The aortic valve is tricuspid. Aortic valve regurgitation is not visualized. No aortic stenosis is present.  5. The inferior vena cava is normal in size with greater than 50% respiratory variability, suggesting right atrial pressure of 3 mmHg. FINDINGS  Left Ventricle: Left ventricular ejection fraction, by estimation,  is 60 to 65%. The left ventricle has normal function. The left ventricle has no regional wall motion abnormalities. The left ventricular internal cavity size was normal in size. There is  no left ventricular hypertrophy. Left ventricular diastolic parameters are consistent with Grade I diastolic dysfunction (impaired relaxation). Right Ventricle: The right ventricular size is normal. No increase in right ventricular  wall thickness. Right ventricular systolic function is normal. Left Atrium: Left atrial size was normal in size. Right Atrium: Right atrial size was normal in size. Pericardium: There is no evidence of pericardial effusion. Mitral Valve: The mitral valve is normal in structure. Mild mitral valve regurgitation. No evidence of mitral valve stenosis. Tricuspid Valve: The tricuspid valve is normal in structure. Tricuspid valve regurgitation is not demonstrated. No evidence of tricuspid stenosis. Aortic Valve: The aortic valve is tricuspid. Aortic valve regurgitation is not visualized. No aortic stenosis is present. Pulmonic Valve: The pulmonic valve was normal in structure. Pulmonic valve regurgitation is not visualized. No evidence of pulmonic stenosis. Aorta: The aortic root is normal in size and structure. Venous: The inferior vena cava is normal in size with greater than 50% respiratory variability, suggesting right atrial pressure of 3 mmHg. IAS/Shunts: No atrial level shunt detected by color flow Doppler.  LEFT VENTRICLE PLAX 2D LVIDd:         3.70 cm     Diastology LVIDs:         2.70 cm     LV e' medial:    8.55 cm/s LV PW:         0.90 cm     LV E/e' medial:  7.4 LV IVS:        0.80 cm     LV e' lateral:   5.28 cm/s LVOT diam:     2.00 cm     LV E/e' lateral: 12.0 LV SV:         36 LV SV Index:   27 LVOT Area:     3.14 cm LV IVRT:       137 msec  LV Volumes (MOD) LV vol d, MOD A2C: 40.7 ml LV vol d, MOD A4C: 32.1 ml LV vol s, MOD A2C: 18.3 ml LV vol s, MOD A4C: 11.4 ml LV SV MOD A2C:     22.4 ml LV SV MOD A4C:     32.1 ml LV SV MOD BP:      22.4 ml RIGHT VENTRICLE             IVC RV Basal diam:  2.70 cm     IVC diam: 0.90 cm RV Mid diam:    2.40 cm RV S prime:     12.60 cm/s TAPSE (M-mode): 1.4 cm LEFT ATRIUM             Index        RIGHT ATRIUM          Index LA diam:        2.10 cm 1.57 cm/m   RA Area:     6.18 cm LA Vol (A2C):   15.5 ml 11.58 ml/m  RA Volume:   10.80 ml 8.07 ml/m LA Vol (A4C):   13.0  ml 9.71 ml/m LA Biplane Vol: 15.6 ml 11.65 ml/m  AORTIC VALVE LVOT Vmax:   65.90 cm/s LVOT Vmean:  40.100 cm/s LVOT VTI:    0.115 m  AORTA Ao Root diam: 2.80 cm Ao Asc diam:  2.70 cm MITRAL VALVE  TRICUSPID VALVE MV Area (PHT): 4.80 cm    TR Peak grad:   6.4 mmHg MV Decel Time: 158 msec    TR Vmax:        126.00 cm/s MR Peak grad: 54.3 mmHg MR Vmax:      368.50 cm/s  SHUNTS MV E velocity: 63.10 cm/s  Systemic VTI:  0.12 m MV A velocity: 68.40 cm/s  Systemic Diam: 2.00 cm MV E/A ratio:  0.92 Morene Brownie Electronically signed by Morene Brownie Signature Date/Time: 05/18/2024/4:50:12 PM    Final    CT Chest Wo Contrast Result Date: 05/17/2024 CLINICAL DATA:  Shortness of breath and cough. EXAM: CT CHEST WITHOUT CONTRAST TECHNIQUE: Multidetector CT imaging of the chest was performed following the standard protocol without IV contrast. RADIATION DOSE REDUCTION: This exam was performed according to the departmental dose-optimization program which includes automated exposure control, adjustment of the mA and/or kV according to patient size and/or use of iterative reconstruction technique. COMPARISON:  Chest CT dated 04/09/2024. FINDINGS: Evaluation of this exam is limited in the absence of intravenous contrast. Cardiovascular: There is no cardiomegaly. Trace pericardial effusion. There is mild atherosclerotic calcification of the thoracic aorta. No aneurysmal dilatation. The central pulmonary arteries are grossly unremarkable. Mediastinum/Nodes: No hilar or mediastinal adenopathy. The esophagus is grossly unremarkable. No mediastinal fluid collection. Lungs/Pleura: Linear scarring in the right middle lobe appear scattered clusters of ground-glass nodular densities with tree-in-bud appearance primarily in the lingula and to a lesser degree in the left lower lobe may be residual from prior infectious process or represent recurrent atypical infection. No consolidative changes. There is no pleural effusion  or pneumothorax. Right apical scarring. 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 The central airways are patent. Upper Abdomen: Gallstones. Musculoskeletal: Osteopenia.  No acute osseous pathology. IMPRESSION: 1. Scattered clusters of ground-glass nodular densities with tree-in-bud appearance primarily in the lingula and to a lesser degree in the left lower lobe may be residual from prior infectious process or represent recurrent atypical infection. 2. Cholelithiasis. 3.  Aortic Atherosclerosis (ICD10-I70.0). Electronically Signed   By: Vanetta Chou M.D.   On: 05/17/2024 18:35   DG Chest Port 1 View Result Date: 05/17/2024 CLINICAL DATA:  Chest pain. EXAM: PORTABLE CHEST 1 VIEW COMPARISON:  04/09/2024 radiographs and CT FINDINGS: The heart is normal in size. Mediastinal contours are stable with aortic atherosclerosis. Chronic hyperinflation and bronchial thickening. Improvement in bibasilar aeration from prior. No progressive airspace disease. Right costophrenic angle not included in the field of view. No pneumothorax or large pleural effusion. On limited assessment, no acute osseous findings. IMPRESSION: Chronic hyperinflation and bronchial thickening. Improvement in bibasilar aeration from prior. No progressive airspace disease. Electronically Signed   By: Andrea Gasman M.D.   On: 05/17/2024 17:53        Scheduled Meds:  amLODipine   2.5 mg Oral Daily   ascorbic acid   500 mg Oral Daily   budesonide   0.5 mg Nebulization BID   chlorpheniramine-HYDROcodone   5 mL Oral Q12H   donepezil   5 mg Oral QHS   ezetimibe   10 mg Oral QHS   And   simvastatin   20 mg Oral QHS   guaiFENesin   10 mL Oral Q8H   heparin   5,000 Units Subcutaneous Q8H   insulin  aspart  0-20 Units Subcutaneous TID WC   insulin  glargine  25 Units Subcutaneous QHS   ipratropium-albuterol   3 mL Nebulization Q6H   lactobacillus  1 g Oral TID WC   latanoprost   1 drop Both Eyes QHS  levothyroxine   50 mcg Oral See admin instructions   magnesium   oxide  400 mg Oral Daily   megestrol   600 mg Oral QAC supper   methylPREDNISolone  (SOLU-MEDROL ) injection  40 mg Intravenous Q12H   metoprolol  tartrate  25 mg Oral BID   oseltamivir   30 mg Oral Daily   oxybutynin   10 mg Oral Daily   pantoprazole   40 mg Oral Daily   sodium chloride  flush  3 mL Intravenous Q12H   sodium chloride  flush  3 mL Intravenous Q12H   zinc  sulfate (50mg  elemental zinc )  220 mg Oral Daily   Continuous Infusions:   ceFAZolin  (ANCEF ) IV 2 g (05/19/24 0440)     LOS: 1 day    Time spent: 55 minutes.    Leatrice Chapel, MD  [facility] 7PM-7AM contact night coverage as above    "

## 2024-05-19 NOTE — Plan of Care (Signed)

## 2024-05-19 NOTE — Progress Notes (Signed)
 PHARMACY - PHYSICIAN COMMUNICATION CRITICAL VALUE ALERT - BLOOD CULTURE IDENTIFICATION (BCID)  Hannah [name] is an 89 y.o. female who presented to [facility] on 05/17/2024 with a chief complaint of MSSA bacterial PNA  Assessment:  BCID w/ nothing identified  Name of physician (or Provider) Contacted: Sundil, S  Current antibiotics: cefazolin   Changes to prescribed antibiotics recommended:  None  Results for orders placed or performed during the hospital encounter of 05/17/24  Blood Culture ID Panel (Reflexed) (Collected: 05/18/2024  7:15 PM)  Result Value Ref Range   Enterococcus faecalis NOT DETECTED NOT DETECTED   Enterococcus Faecium NOT DETECTED NOT DETECTED   Listeria monocytogenes NOT DETECTED NOT DETECTED   Staphylococcus species NOT DETECTED NOT DETECTED   Staphylococcus aureus (BCID) NOT DETECTED NOT DETECTED   Staphylococcus epidermidis NOT DETECTED NOT DETECTED   Staphylococcus lugdunensis NOT DETECTED NOT DETECTED   Streptococcus species NOT DETECTED NOT DETECTED   Streptococcus agalactiae NOT DETECTED NOT DETECTED   Streptococcus pneumoniae NOT DETECTED NOT DETECTED   Streptococcus pyogenes NOT DETECTED NOT DETECTED   A.calcoaceticus-baumannii NOT DETECTED NOT DETECTED   Bacteroides fragilis NOT DETECTED NOT DETECTED   Enterobacterales NOT DETECTED NOT DETECTED   Enterobacter cloacae complex NOT DETECTED NOT DETECTED   Escherichia coli NOT DETECTED NOT DETECTED   Klebsiella aerogenes NOT DETECTED NOT DETECTED   Klebsiella oxytoca NOT DETECTED NOT DETECTED   Klebsiella pneumoniae NOT DETECTED NOT DETECTED   Proteus species NOT DETECTED NOT DETECTED   Salmonella species NOT DETECTED NOT DETECTED   Serratia marcescens NOT DETECTED NOT DETECTED   Haemophilus influenzae NOT DETECTED NOT DETECTED   Neisseria meningitidis NOT DETECTED NOT DETECTED   Pseudomonas aeruginosa NOT DETECTED NOT DETECTED   Stenotrophomonas maltophilia NOT DETECTED NOT DETECTED   Candida  albicans NOT DETECTED NOT DETECTED   Candida auris NOT DETECTED NOT DETECTED   Candida glabrata NOT DETECTED NOT DETECTED   Candida krusei NOT DETECTED NOT DETECTED   Candida parapsilosis NOT DETECTED NOT DETECTED   Candida tropicalis NOT DETECTED NOT DETECTED   Cryptococcus neoformans/gattii NOT DETECTED NOT DETECTED    Hannah [name], BCPS, BCCP Clinical Pharmacist  05/19/2024 1:42 AM   [facility] pharmacy phone numbers are listed on [email or web address]

## 2024-05-19 NOTE — Evaluation (Signed)
 Physical Therapy Evaluation Patient Details Name: Hannah [name] MRN: [id number] DOB: 1933/02/06 Today's Date: 05/19/2024  History of Present Illness  Pt is an 89 y.o. F presenting to [facility] on 05/17/24 with SOB, cough, fatigue, BLE swelling, and generalized weakness. PMH is significant for CHF, HTN, COPD, hypothyroidism, DM T2, and CKD.  Clinical Impression  Prior to admittance, pt was using a rollator for mobility and was needing assistance for IADLs. Pt lives with children who work and has 5 steps to enter her home. Pt presents to evaluation with deficits in mobility, strength, power, activity tolerance, and balance, all limiting pt's ability to mobilize near baseline. Pt was able to perform bed mobility with minimal physical assistance and completed stand-step transfer to the recliner with AD and minimal physical assistance. Further mobility deferred due to significant fatigue with pt falling asleep during conversation. Of note, pt has full body tremors with exertion. Pt with mild swelling in LEs and was encouraged to keep LEs elevated and perform ankle pumps throughotu the day to assist with reducing swelling. PT will continue to treat pt while she is admitted. Patient will benefit from continued inpatient follow up therapy, <3 hours/day.         If plan is discharge home, recommend the following: A lot of help with walking and/or transfers;A lot of help with bathing/dressing/bathroom;Assistance with cooking/housework;Assistance with feeding;Direct supervision/assist for medications management;Direct supervision/assist for financial management;Assist for transportation;Help with stairs or ramp for entrance   Can travel by private vehicle   No    Equipment Recommendations Rolling walker (2 wheels);Wheelchair (measurements PT);Wheelchair cushion (measurements PT)  Recommendations for Other Services       Functional Status Assessment Patient has had a recent decline in their functional status and  demonstrates the ability to make significant improvements in function in a reasonable and predictable amount of time.     Precautions / Restrictions Precautions Precautions: Fall Recall of Precautions/Restrictions: Intact Restrictions Weight Bearing Restrictions Per Provider Order: No      Mobility  Bed Mobility Overal bed mobility: Needs Assistance Bed Mobility: Supine to Sit     Supine to sit: Min assist, HOB elevated, Used rails     General bed mobility comments: Pt completed supine to sit on R side of bed, with HOB elevated, using railing to assist with pulling to sit. Pt able to advance BLEs off EOB. VC given for step by step sequencing. Increased time to complete.    Transfers Overall transfer level: Needs assistance Equipment used: Rolling walker (2 wheels) Transfers: Sit to/from Stand, Bed to chair/wheelchair/BSC Sit to Stand: Min assist   Step pivot transfers: Min assist       General transfer comment: Pt completed STS from EOB w/ RW and minimal physical assistance for initial power up. Pt then perform stand-step transfer from EOB to recliner on the R with RW and minimal physical assistance. Pt requires assistance with walker management. VC given for sequencing; increased time to complete.    Ambulation/Gait                  Stairs            Wheelchair Mobility     Tilt Bed    Modified Rankin (Stroke Patients Only)       Balance Overall balance assessment: Needs assistance Sitting-balance support: Bilateral upper extremity supported, Feet supported Sitting balance-Leahy Scale: Poor Sitting balance - Comments: pt requires BUE supported on bed for improved stability. Postural control: Posterior lean Standing  balance support: Bilateral upper extremity supported, During functional activity, Reliant on assistive device for balance Standing balance-Leahy Scale: Poor Standing balance comment: reliant on external support                              Pertinent Vitals/Pain Pain Assessment Pain Assessment: No/denies pain    Home Living Family/patient expects to be discharged to:: Private residence Living Arrangements: Children Available Help at Discharge: Family;Available PRN/intermittently (all kids work, but rotate checking in on her at lunch time) Type of Home: House Home Access: Stairs to enter Entrance Stairs-Rails: None Entrance Stairs-Number of Steps: 5   Home Layout: One level Home Equipment: Rollator (4 wheels);Shower seat (walking stick)      Prior Function Prior Level of Function : Needs assist             Mobility Comments: per pt's daughter, pt has been mobilizing with rollator in mian areas of the house, but rollator does not fit in bathroom. ADLs Comments: family assists with IADL, PRN assist for bathing/dressing     Extremity/Trunk Assessment   Upper Extremity Assessment Upper Extremity Assessment: Generalized weakness    Lower Extremity Assessment Lower Extremity Assessment: Generalized weakness (notable tremors in BUE and BLE with exertion)    Cervical / Trunk Assessment Cervical / Trunk Assessment: Kyphotic  Communication   Communication Communication: No apparent difficulties    Cognition Arousal: Lethargic Behavior During Therapy: Flat affect   PT - Cognitive impairments: History of cognitive impairments, Sequencing, Problem solving, Safety/Judgement                         Following commands: Impaired Following commands impaired: Follows one step commands with increased time     Cueing Cueing Techniques: Verbal cues, Gestural cues     General Comments General comments (skin integrity, edema, etc.): 2L SpO2 97%    Exercises     Assessment/Plan    PT Assessment Patient needs continued PT services  PT Problem List Decreased strength;Decreased range of motion;Decreased activity tolerance;Decreased balance;Decreased mobility;Decreased  coordination;Decreased cognition;Decreased knowledge of use of DME       PT Treatment Interventions DME instruction;Gait training;Stair training;Functional mobility training;Therapeutic activities;Therapeutic exercise;Balance training;Neuromuscular re-education;Cognitive remediation;Patient/family education;Wheelchair mobility training;Manual techniques;Modalities    PT Goals (Current goals can be found in the Care Plan section)  Acute Rehab PT Goals Patient Stated Goal: unable to state at this time PT Goal Formulation: With patient/family Time For Goal Achievement: 06/02/24 Potential to Achieve Goals: Fair    Frequency Min 2X/week     Co-evaluation               AM-PAC PT 6 Clicks Mobility  Outcome Measure Help needed turning from your back to your side while in a flat bed without using bedrails?: A Little Help needed moving from lying on your back to sitting on the side of a flat bed without using bedrails?: A Little Help needed moving to and from a bed to a chair (including a wheelchair)?: A Little Help needed standing up from a chair using your arms (e.g., wheelchair or bedside chair)?: A Little Help needed to walk in hospital room?: Total Help needed climbing 3-5 steps with a railing? : Total 6 Click Score: 14    End of Session Equipment Utilized During Treatment: Gait belt;Oxygen Activity Tolerance: Patient tolerated treatment well Patient left: in chair;with call bell/phone within reach;with chair alarm set;with family/visitor present  Nurse Communication: Mobility status PT Visit Diagnosis: Unsteadiness on feet (R26.81);Other abnormalities of gait and mobility (R26.89);Muscle weakness (generalized) (M62.81);Difficulty in walking, not elsewhere classified (R26.2)    Time: 8883-8844 PT Time Calculation (min) (ACUTE ONLY): 39 min   Charges:   PT Evaluation $PT Eval Moderate Complexity: 1 Mod   PT General Charges $$ ACUTE PT VISIT: 1 Visit         Leontine Hilt DPT Acute Rehab Services [phone] Prefer contact via chat   Leontine NOVAK Gaylen Pereira 05/19/2024, 12:13 PM

## 2024-05-19 NOTE — TOC Initial Note (Signed)
 Transition of Care [facility]) - Initial/Assessment Note    Patient Details  Name: Hannah [name] MRN: [id number] Date of Birth: 1932-10-07  [facility] of [facility] CM/SW Contact:    Bryann Mcnealy B Marchello Rothgeb, LCSWA Phone Number: 05/19/2024, 4:05 PM  Clinical Narrative:                  CSW spoke with pt's daughter Gordy, she states she agrees pt needs more PT but pt is of Hindu religion and follows a strict regimen and is worried that the facility may not follow. CSW provided pt's daughter with information for [facility] for additional community resources. ICM will continue to follow.        Patient Goals and CMS Choice            Expected Discharge Plan and Services                                              Prior Living Arrangements/Services                       Activities of Daily Living   ADL Screening (condition at time of admission) Independently performs ADLs?: No Does the patient have a NEW difficulty with bathing/dressing/toileting/self-feeding that is expected to last >3 days?: Yes (Initiates electronic notice to provider for possible OT consult) Does the patient have a NEW difficulty with getting in/out of bed, walking, or climbing stairs that is expected to last >3 days?: Yes (Initiates electronic notice to provider for possible PT consult) Does the patient have a NEW difficulty with communication that is expected to last >3 days?: No Is the patient deaf or have difficulty hearing?: Yes Does the patient have difficulty seeing, even when wearing glasses/contacts?: No Does the patient have difficulty concentrating, remembering, or making decisions?: Yes  Permission Sought/Granted                  Emotional Assessment              Admission diagnosis:  Influenza A [J10.1] Sepsis due to pneumonia (HCC) [J18.9, A41.9] Pneumonia due to infectious organism, unspecified laterality, unspecified part of lung [J18.9] Sepsis, due to  unspecified organism, unspecified whether acute organ dysfunction present (HCC) [A41.9] Acute hypoxic respiratory failure (HCC) [J96.01] Patient Active Problem List   Diagnosis Date Noted   Acute hypoxic respiratory failure (HCC) 05/18/2024   UTI (urinary tract infection) 05/18/2024   Influenza A with pneumonia 05/18/2024   Sepsis due to pneumonia (HCC) 05/17/2024   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 ([address]),  - [address] [place] ([place]) [place] [address] Phone: [phone] Fax: [phone]     Social Drivers of Health (SDOH) Social History: SDOH Screenings   Food Insecurity: Patient Declined (05/18/2024)  Housing: Low Risk (11/27/2023)  Transportation Needs: Unknown (05/18/2024)  Utilities: Not At Risk (11/27/2023)  Social Connections: Unknown (11/27/2023)  Tobacco Use: Low Risk (05/17/2024)   SDOH Interventions:     Readmission Risk Interventions     No data to display

## 2024-05-19 NOTE — Progress Notes (Signed)
 "   [facility]    Date of Admission:  05/17/2024   Total days of antibiotics 3/day 2 cefazolin  and oseltamivir            ID: Hannah [name] is a 89 y.o. female with severe flu a with secondary MSSA pneumonia and secondary bacteremia Principal Problem:   Sepsis due to pneumonia (HCC) Active Problems:   Chronic obstructive asthma (HCC)   Hypothyroidism   Hypertension   Insulin  dependent type 2 diabetes mellitus (HCC)   CKD stage 3a, GFR 45-59 ml/min (HCC)   Iron  deficiency anemia   Acute hypoxic respiratory failure (HCC)   UTI (urinary tract infection)   Influenza A with pneumonia    Subjective: Afebrile Leukocytosis improving TTE negative for vegetation  Medications:   amLODipine   2.5 mg Oral Daily   ascorbic acid   500 mg Oral Daily   budesonide   0.5 mg Nebulization BID   chlorpheniramine-HYDROcodone   5 mL Oral Q12H   donepezil   5 mg Oral QHS   ezetimibe   10 mg Oral QHS   And   simvastatin   20 mg Oral QHS   guaiFENesin   10 mL Oral Q8H   heparin   5,000 Units Subcutaneous Q8H   insulin  aspart  0-20 Units Subcutaneous TID WC   insulin  glargine  25 Units Subcutaneous QHS   ipratropium-albuterol   3 mL Nebulization Q6H   lactobacillus  1 g Oral TID WC   latanoprost   1 drop Both Eyes QHS   levothyroxine   50 mcg Oral See admin instructions   magnesium  oxide  400 mg Oral Daily   megestrol   600 mg Oral QAC supper   methylPREDNISolone  (SOLU-MEDROL ) injection  40 mg Intravenous Q12H   metoprolol  tartrate  25 mg Oral BID   oseltamivir   30 mg Oral Daily   oxybutynin   10 mg Oral Daily   pantoprazole   40 mg Oral Daily   sodium chloride  flush  3 mL Intravenous Q12H   sodium chloride  flush  3 mL Intravenous Q12H   zinc  sulfate (50mg  elemental zinc )  220 mg Oral Daily    Objective: Vital signs in last 24 hours: Temp:  [98.4 F (36.9 C)-98.7 F (37.1 C)] 98.4 F (36.9 C) (01/03 0436) Pulse Rate:  [70-97] 74 (01/03 0436) Resp:  [15-24] 15 (01/03  0436) BP: (145-176)/(79-93) 149/82 (01/03 0436) SpO2:  [97 %-100 %] 100 % (01/03 0819) FiO2 (%):  [30 %] 30 % (01/02 1156) Weight:  [41.4 kg-41.5 kg] 41.4 kg (01/03 0438)  Physical Exam  Constitutional: He is oriented to person, place, and time. He appears well-developed and well-nourished. No distress.  HENT:  Mouth/Throat: Oropharynx is clear and moist. No oropharyngeal exudate.  Cardiovascular: Normal rate, regular rhythm and normal heart sounds. Exam reveals no gallop and no friction rub.  No murmur heard.  Pulmonary/Chest: Effort normal and breath sounds normal. No respiratory distress. He has no wheezes.  Abdominal: Soft. Bowel sounds are normal. He exhibits no distension. There is no tenderness.  Lymphadenopathy:  He has no cervical adenopathy.  Neurological: He is alert and oriented to person, place, and time.  Skin: Skin is warm and dry. No rash noted. No erythema.  Psychiatric: He has a normal mood and affect. His behavior is normal.    Lab Results Recent Labs    05/18/24 0315 05/19/24 0904  WBC 12.7* 10.3  HGB 10.3* 12.0  HCT 31.0* 36.5  NA 140 138  K 4.3 3.5  CL 102 99  CO2 25  24  BUN 22 13  CREATININE 1.26* 1.11*   Liver Panel Recent Labs    05/18/24 0315  PROT 5.5*  ALBUMIN 3.2*  AST 47*  ALT 31  ALKPHOS 49  BILITOT <0.2   Sedimentation Rate Recent Labs    05/18/24 1641  ESRSEDRATE 35*   C-Reactive Protein Recent Labs    05/18/24 1641  CRP 7.5*    Microbiology: 1/3 blood cx ngtd 1/2 blood cx ngtd 1/1 blood cx MSSA Studies/Results: ECHOCARDIOGRAM COMPLETE Result Date: 05/18/2024    ECHOCARDIOGRAM REPORT   Patient Name:   Hannah [name] Date of Exam: 05/18/2024 Medical Rec #:  [id number]       Height:       60.0 in Accession #:    [id number]      Weight:       91.5 lb Date of Birth:  Jun 30, 1932       BSA:          1.339 m Patient Age:    89 years        BP:           152/93 mmHg Patient Gender: F               HR:           144 bpm. Exam  Location:  Inpatient Procedure: 2D Echo, Cardiac Doppler and Color Doppler (Both Spectral and Color            Flow Doppler were utilized during procedure). Indications:    CHF- Acute Systolic  History:        Patient has prior history of Echocardiogram examinations, most                 recent 05/04/2024. Risk Factors:Hypertension and Diabetes.  Sonographer:    Sherlean Dubin Referring Phys: [id number] SUBRINA SUNDIL  Sonographer Comments: Image acquisition challenging due to respiratory motion. IMPRESSIONS  1. Left ventricular ejection fraction, by estimation, is 60 to 65%. The left ventricle has normal function. The left ventricle has no regional wall motion abnormalities. Left ventricular diastolic parameters are consistent with Grade I diastolic dysfunction (impaired relaxation).  2. Right ventricular systolic function is normal. The right ventricular size is normal.  3. The mitral valve is normal in structure. Mild mitral valve regurgitation. No evidence of mitral stenosis.  4. The aortic valve is tricuspid. Aortic valve regurgitation is not visualized. No aortic stenosis is present.  5. The inferior vena cava is normal in size with greater than 50% respiratory variability, suggesting right atrial pressure of 3 mmHg. FINDINGS  Left Ventricle: Left ventricular ejection fraction, by estimation, is 60 to 65%. The left ventricle has normal function. The left ventricle has no regional wall motion abnormalities. The left ventricular internal cavity size was normal in size. There is  no left ventricular hypertrophy. Left ventricular diastolic parameters are consistent with Grade I diastolic dysfunction (impaired relaxation). Right Ventricle: The right ventricular size is normal. No increase in right ventricular wall thickness. Right ventricular systolic function is normal. Left Atrium: Left atrial size was normal in size. Right Atrium: Right atrial size was normal in size. Pericardium: There is no evidence of  pericardial effusion. Mitral Valve: The mitral valve is normal in structure. Mild mitral valve regurgitation. No evidence of mitral valve stenosis. Tricuspid Valve: The tricuspid valve is normal in structure. Tricuspid valve regurgitation is not demonstrated. No evidence of tricuspid stenosis. Aortic Valve: The aortic valve is tricuspid. Aortic valve regurgitation is not  visualized. No aortic stenosis is present. Pulmonic Valve: The pulmonic valve was normal in structure. Pulmonic valve regurgitation is not visualized. No evidence of pulmonic stenosis. Aorta: The aortic root is normal in size and structure. Venous: The inferior vena cava is normal in size with greater than 50% respiratory variability, suggesting right atrial pressure of 3 mmHg. IAS/Shunts: No atrial level shunt detected by color flow Doppler.  LEFT VENTRICLE PLAX 2D LVIDd:         3.70 cm     Diastology LVIDs:         2.70 cm     LV e' medial:    8.55 cm/s LV PW:         0.90 cm     LV E/e' medial:  7.4 LV IVS:        0.80 cm     LV e' lateral:   5.28 cm/s LVOT diam:     2.00 cm     LV E/e' lateral: 12.0 LV SV:         36 LV SV Index:   27 LVOT Area:     3.14 cm LV IVRT:       137 msec  LV Volumes (MOD) LV vol d, MOD A2C: 40.7 ml LV vol d, MOD A4C: 32.1 ml LV vol s, MOD A2C: 18.3 ml LV vol s, MOD A4C: 11.4 ml LV SV MOD A2C:     22.4 ml LV SV MOD A4C:     32.1 ml LV SV MOD BP:      22.4 ml RIGHT VENTRICLE             IVC RV Basal diam:  2.70 cm     IVC diam: 0.90 cm RV Mid diam:    2.40 cm RV S prime:     12.60 cm/s TAPSE (M-mode): 1.4 cm LEFT ATRIUM             Index        RIGHT ATRIUM          Index LA diam:        2.10 cm 1.57 cm/m   RA Area:     6.18 cm LA Vol (A2C):   15.5 ml 11.58 ml/m  RA Volume:   10.80 ml 8.07 ml/m LA Vol (A4C):   13.0 ml 9.71 ml/m LA Biplane Vol: 15.6 ml 11.65 ml/m  AORTIC VALVE LVOT Vmax:   65.90 cm/s LVOT Vmean:  40.100 cm/s LVOT VTI:    0.115 m  AORTA Ao Root diam: 2.80 cm Ao Asc diam:  2.70 cm MITRAL VALVE                TRICUSPID VALVE MV Area (PHT): 4.80 cm    TR Peak grad:   6.4 mmHg MV Decel Time: 158 msec    TR Vmax:        126.00 cm/s MR Peak grad: 54.3 mmHg MR Vmax:      368.50 cm/s  SHUNTS MV E velocity: 63.10 cm/s  Systemic VTI:  0.12 m MV A velocity: 68.40 cm/s  Systemic Diam: 2.00 cm MV E/A ratio:  0.92 Morene Brownie Electronically signed by Morene Brownie Signature Date/Time: 05/18/2024/4:50:12 PM    Final    CT Chest Wo Contrast Result Date: 05/17/2024 CLINICAL DATA:  Shortness of breath and cough. EXAM: CT CHEST WITHOUT CONTRAST TECHNIQUE: Multidetector CT imaging of the chest was performed following the standard protocol without IV contrast. RADIATION DOSE REDUCTION: This exam was performed  according to the departmental dose-optimization program which includes automated exposure control, adjustment of the mA and/or kV according to patient size and/or use of iterative reconstruction technique. COMPARISON:  Chest CT dated 04/09/2024. FINDINGS: Evaluation of this exam is limited in the absence of intravenous contrast. Cardiovascular: There is no cardiomegaly. Trace pericardial effusion. There is mild atherosclerotic calcification of the thoracic aorta. No aneurysmal dilatation. The central pulmonary arteries are grossly unremarkable. Mediastinum/Nodes: No hilar or mediastinal adenopathy. The esophagus is grossly unremarkable. No mediastinal fluid collection. Lungs/Pleura: Linear scarring in the right middle lobe appear scattered clusters of ground-glass nodular densities with tree-in-bud appearance primarily in the lingula and to a lesser degree in the left lower lobe may be residual from prior infectious process or represent recurrent atypical infection. No consolidative changes. There is no pleural effusion or pneumothorax. Right apical scarring. 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 The central airways are patent. Upper Abdomen: Gallstones. Musculoskeletal: Osteopenia.  No acute osseous pathology. IMPRESSION: 1.  Scattered clusters of ground-glass nodular densities with tree-in-bud appearance primarily in the lingula and to a lesser degree in the left lower lobe may be residual from prior infectious process or represent recurrent atypical infection. 2. Cholelithiasis. 3.  Aortic Atherosclerosis (ICD10-I70.0). Electronically Signed   By: Vanetta Chou M.D.   On: 05/17/2024 18:35   DG Chest Port 1 View Result Date: 05/17/2024 CLINICAL DATA:  Chest pain. EXAM: PORTABLE CHEST 1 VIEW COMPARISON:  04/09/2024 radiographs and CT FINDINGS: The heart is normal in size. Mediastinal contours are stable with aortic atherosclerosis. Chronic hyperinflation and bronchial thickening. Improvement in bibasilar aeration from prior. No progressive airspace disease. Right costophrenic angle not included in the field of view. No pneumothorax or large pleural effusion. On limited assessment, no acute osseous findings. IMPRESSION: Chronic hyperinflation and bronchial thickening. Improvement in bibasilar aeration from prior. No progressive airspace disease. Electronically Signed   By: Andrea Gasman M.D.   On: 05/17/2024 17:53      Assessment/Plan: 89 yo M with severe influenza complicated by secondary MSSA pneumonia nad bacteremia  - continue with oseltamivir  x 5 day - continue with cefazolin  2gm iv q 8hr  Acute on chronic ckd = improved  Mssa bacteremia = will discuss with cardiology if candidate for TEE given his age  T2DM with hyperglycemia= continue to adjust his regimen for better glucose control  [facility] Teaching Service/Infectious Disease Pager: [phone]  05/19/2024, 10:55 AM      "

## 2024-05-20 ENCOUNTER — Inpatient Hospital Stay (HOSPITAL_COMMUNITY)

## 2024-05-20 DIAGNOSIS — B954 Other streptococcus as the cause of diseases classified elsewhere: Secondary | ICD-10-CM

## 2024-05-20 DIAGNOSIS — J15211 Pneumonia due to Methicillin susceptible Staphylococcus aureus: Secondary | ICD-10-CM

## 2024-05-20 DIAGNOSIS — R7881 Bacteremia: Secondary | ICD-10-CM | POA: Diagnosis not present

## 2024-05-20 DIAGNOSIS — D72829 Elevated white blood cell count, unspecified: Secondary | ICD-10-CM | POA: Diagnosis not present

## 2024-05-20 DIAGNOSIS — J09X1 Influenza due to identified novel influenza A virus with pneumonia: Secondary | ICD-10-CM | POA: Diagnosis not present

## 2024-05-20 DIAGNOSIS — R197 Diarrhea, unspecified: Secondary | ICD-10-CM | POA: Diagnosis not present

## 2024-05-20 DIAGNOSIS — J189 Pneumonia, unspecified organism: Secondary | ICD-10-CM | POA: Diagnosis not present

## 2024-05-20 DIAGNOSIS — A419 Sepsis, unspecified organism: Secondary | ICD-10-CM | POA: Diagnosis not present

## 2024-05-20 LAB — CBC
HCT: 31 % — ABNORMAL LOW (ref 36.0–46.0)
Hemoglobin: 10.3 g/dL — ABNORMAL LOW (ref 12.0–15.0)
MCH: 29.5 pg (ref 26.0–34.0)
MCHC: 33.2 g/dL (ref 30.0–36.0)
MCV: 88.8 fL (ref 80.0–100.0)
Platelets: 265 K/uL (ref 150–400)
RBC: 3.49 MIL/uL — ABNORMAL LOW (ref 3.87–5.11)
RDW: 17.2 % — ABNORMAL HIGH (ref 11.5–15.5)
WBC: 16.9 K/uL — ABNORMAL HIGH (ref 4.0–10.5)
nRBC: 0.1 % (ref 0.0–0.2)

## 2024-05-20 LAB — CULTURE, BLOOD (SINGLE): Special Requests: ADEQUATE

## 2024-05-20 LAB — PROCALCITONIN: Procalcitonin: 0.11 ng/mL

## 2024-05-20 LAB — PRO BRAIN NATRIURETIC PEPTIDE: Pro Brain Natriuretic Peptide: 1916 pg/mL — ABNORMAL HIGH

## 2024-05-20 LAB — GLUCOSE, CAPILLARY
Glucose-Capillary: 177 mg/dL — ABNORMAL HIGH (ref 70–99)
Glucose-Capillary: 288 mg/dL — ABNORMAL HIGH (ref 70–99)
Glucose-Capillary: 362 mg/dL — ABNORMAL HIGH (ref 70–99)
Glucose-Capillary: 364 mg/dL — ABNORMAL HIGH (ref 70–99)

## 2024-05-20 LAB — BASIC METABOLIC PANEL WITH GFR
Anion gap: 10 (ref 5–15)
BUN: 15 mg/dL (ref 8–23)
CO2: 26 mmol/L (ref 22–32)
Calcium: 8.1 mg/dL — ABNORMAL LOW (ref 8.9–10.3)
Chloride: 103 mmol/L (ref 98–111)
Creatinine, Ser: 0.98 mg/dL (ref 0.44–1.00)
GFR, Estimated: 55 mL/min — ABNORMAL LOW
Glucose, Bld: 166 mg/dL — ABNORMAL HIGH (ref 70–99)
Potassium: 3.6 mmol/L (ref 3.5–5.1)
Sodium: 139 mmol/L (ref 135–145)

## 2024-05-20 MED ORDER — INSULIN ASPART 100 UNIT/ML IJ SOLN: 0.0000 [IU] | Freq: Three times a day (TID) | INTRAMUSCULAR | Status: DC

## 2024-05-20 MED ORDER — INSULIN GLARGINE-YFGN 100 UNIT/ML ~~LOC~~ SOLN
25.0000 [IU] | Freq: Every day | SUBCUTANEOUS | Status: DC
  Administered 2024-05-21: 25 [IU] via SUBCUTANEOUS
  Filled 2024-05-20 (×3): qty 0.25

## 2024-05-20 MED ORDER — FUROSEMIDE 10 MG/ML IJ SOLN
40.0000 mg | Freq: Once | INTRAMUSCULAR | Status: AC
  Administered 2024-05-20: 40 mg via INTRAVENOUS
  Filled 2024-05-20: qty 4

## 2024-05-20 MED ORDER — ALBUTEROL SULFATE (2.5 MG/3ML) 0.083% IN NEBU
2.5000 mg | INHALATION_SOLUTION | RESPIRATORY_TRACT | Status: DC | PRN
  Administered 2024-05-20 – 2024-05-26 (×2): 2.5 mg via RESPIRATORY_TRACT
  Filled 2024-05-20 (×2): qty 3

## 2024-05-20 MED ORDER — INSULIN ASPART 100 UNIT/ML IJ SOLN
0.0000 [IU] | Freq: Every day | INTRAMUSCULAR | Status: DC
  Administered 2024-05-20: 5 [IU] via SUBCUTANEOUS
  Administered 2024-05-21: 4 [IU] via SUBCUTANEOUS
  Administered 2024-05-22: 2 [IU] via SUBCUTANEOUS
  Filled 2024-05-20 (×3): qty 5
  Filled 2024-05-20: qty 2

## 2024-05-20 NOTE — Progress Notes (Signed)
 " PROGRESS NOTE    Hannah [name]  FMW:[id number] DOB: 11-20-1932 DOA: 05/17/2024 PCP: Sun, Vyvyan, MD  Outpatient Specialists:     Brief Narrative:  Patient is an 89 year old female with documented past medical history significant for chronic heart failure with preserved ejection fraction, hypertension, COPD, hypothyroidism, chronic kidney disease stage III and type 2 diabetes mellitus.  Patient presented with worsening shortness of breath and edema.  No fever or chills.  No GI symptoms prior to presentation (according to the patient's daughter).  Workup done so far showed positive influenza A by PCR, 1 out of 2 blood cultures growing Staphylococcus aureus, UA with positive nitrite, proBNP of 644, magnesium  of 1.6, serum creatinine of 1.1-1.69 (baseline serum creatinine of 1.26) and WBC of 10.3-12.7.  Patient is currently being managed for influenza A with pneumonia, bacteremia/sepsis, possible UTI, acute on chronic kidney disease stage IIIa and acute hypoxic respiratory failure.  05/19/2024: Patient seen.  Patient is not a particularly good historian.  Discussed and updated patient's daughter over the phone.  Patient's daughter reports that patient is currently having diarrheal stools.  Infectious disease input is appreciated.  Patient is currently on IV cefazolin .  Echo revealed normal left ventricular ejection fraction and grade 1 diastolic dysfunction.  05/20/2024: Patient seen alongside patient's daughter and nurse.  Patient is otherwise updated extensively.  Also communicated with infectious disease team.  Blood culture done on 05/17/2024 has also grown out by abiotrophia defective (will defer to the infectious disease team).  Blood cultures done on 05/18/2024 and 1-26 have not grown any organisms to date.  Patient continues to have watery stool.  C. difficile toxin was negative by PCR, however, C. difficile antigen was positive.  Poor p.o. intake is reported.  Patient remains full code, as per  documentation.  CODE STATUS broached.  Will consult palliative care team.  Long-term prognosis is guarded.  Echocardiogram only revealed grade 1 diastolic dysfunction.  Assessment & Plan:   Principal Problem:   Sepsis due to pneumonia [facility]) Active Problems:   Chronic obstructive asthma (HCC)   Hypothyroidism   Hypertension   Insulin  dependent type 2 diabetes mellitus (HCC)   CKD stage 3a, GFR 45-59 ml/min (HCC)   Iron  deficiency anemia   Acute hypoxic respiratory failure (HCC)   UTI (urinary tract infection)   Influenza A with pneumonia   Influenza A with possible pneumonia: - Supportive care. - Complete course of Tamiflu . - Infectious disease team has recommended 7-day course of Tamiflu .    Acute hypoxic respiratory failure: - Resolved significantly.  Possible UTI: - Urine culture not visualized. -Patient is already on antibiotics.  Possible bacteremia/documented sepsis due to pneumonia: - 1 of 2 blood cultures grew Staphylococcus aureus.  Repeat blood cultures have been negative. - Blood culture done on 05/17/2024 has just been reported to grow abiotrophia defectiva (Communicated with infectious disease team.  Will await further recommendation by the infectious disease team).  - Repeat blood cultures have not grown any organisms to date. - Patient is currently on IV Ancef . - Follow repeat culture results. - Infectious disease team input is greatly appreciated.  Possible acute on chronic diastolic CHF: - Echo revealed normal left ventricular ejection fraction, with grade 1 diastolic dysfunction. - Cautious diuresis.  Patient is currently not in respiratory distress. - Manage expectantly. - Repeat chest x-ray done today has not shown any new changes.  However, improved BMP has risen from 644 to 1916.  IV Lasix  40 mg x 1 dose ordered.  Dementia: - No behavioral problems. - Continue Aricept .  Watery stools versus diarrhea: - Stool PCR was negative for toxigenic C.  difficile, however, was positive for C. difficile antigen. - Infectious disease has not recommended any treatment for C. difficile.  Hypomagnesemia: - Last magnesium  of 1.6. - Repeat renal panel and magnesium  level in the morning.   DVT prophylaxis:  Code Status: Full code Family Communication: Updated patient's daughter over the phone Disposition Plan:    Consultants:  Infectious disease  Procedures:  None  Antimicrobials:  IV Ancef    Subjective: Patient is a poor historian.  Objective: Vitals:   05/20/24 0331 05/20/24 0500 05/20/24 0821 05/20/24 0917  BP: 133/62   129/72  Pulse: 76   90  Resp: 20   19  Temp: 98.4 F (36.9 C)     TempSrc: Oral     SpO2: 100%  100% 97%  Weight:  40.7 kg    Height:        Intake/Output Summary (Last 24 hours) at 05/20/2024 1102 Last data filed at 05/20/2024 0627 Gross per 24 hour  Intake --  Output 1200 ml  Net -1200 ml   Filed Weights   05/18/24 1311 05/19/24 0438 05/20/24 0500  Weight: 41.5 kg 41.4 kg 40.7 kg    Examination:  General exam: Appears calm and comfortable  Respiratory system: Mild expiratory wheeze.   Cardiovascular system: S1 & S2 heard Gastrointestinal system: Abdomen is soft and nontender. Central nervous system: Awake and alert.  Data Reviewed: I have personally reviewed following labs and imaging studies  CBC: Recent Labs  Lab 05/17/24 1721 05/17/24 1728 05/18/24 0231 05/18/24 0315 05/19/24 0904 05/20/24 0215  WBC 12.6*  --   --  12.7* 10.3 16.9*  HGB 12.6 13.6 10.5* 10.3* 12.0 10.3*  HCT 39.1 40.0 31.0* 31.0* 36.5 31.0*  MCV 90.1  --   --  89.1 89.9 88.8  PLT 268  --   --  226 260 265   Basic Metabolic Panel: Recent Labs  Lab 05/17/24 1721 05/17/24 1728 05/17/24 2118 05/18/24 0231 05/18/24 0315 05/18/24 1641 05/19/24 0904 05/20/24 0215  NA 138   < > 137 139 140  --  138 139  K 4.8   < > 4.2 4.0 4.3  --  3.5 3.6  CL 96*  --  99  --  102  --  99 103  CO2 21*  --  21*  --  25  --   24 26  GLUCOSE 330*  --  496*  --  291*  --  236* 166*  BUN 28*  --  26*  --  22  --  13 15  CREATININE 1.69*  --  1.46*  --  1.26*  --  1.11* 0.98  CALCIUM  9.6  --  8.3*  --  8.8*  --  8.6* 8.1*  MG  --   --   --   --   --  1.6*  --   --   PHOS  --   --   --   --   --  2.7  --   --    < > = values in this interval not displayed.   GFR: Estimated Creatinine Clearance: 25 mL/min (by C-G formula based on SCr of 0.98 mg/dL). Liver Function Tests: Recent Labs  Lab 05/18/24 0315  AST 47*  ALT 31  ALKPHOS 49  BILITOT <0.2  PROT 5.5*  ALBUMIN 3.2*   No results for input(s): LIPASE,  AMYLASE in the last 168 hours. No results for input(s): AMMONIA in the last 168 hours. Coagulation Profile: No results for input(s): INR, PROTIME in the last 168 hours. Cardiac Enzymes: No results for input(s): CKTOTAL, CKMB, CKMBINDEX, TROPONINI in the last 168 hours. BNP (last 3 results) Recent Labs    04/09/24 0915 05/17/24 1739 05/18/24 1641  PROBNP 268.0 453.0* 644.0*   HbA1C: No results for input(s): HGBA1C in the last 72 hours. CBG: Recent Labs  Lab 05/19/24 0817 05/19/24 1223 05/19/24 1711 05/19/24 2056 05/20/24 0606  GLUCAP 197* 357* 174* 194* 177*   Lipid Profile: No results for input(s): CHOL, HDL, LDLCALC, TRIG, CHOLHDL, LDLDIRECT in the last 72 hours. Thyroid  Function Tests: No results for input(s): TSH, T4TOTAL, FREET4, T3FREE, THYROIDAB in the last 72 hours. Anemia Panel: No results for input(s): VITAMINB12, FOLATE, FERRITIN, TIBC, IRON , RETICCTPCT in the last 72 hours. Urine analysis:    Component Value Date/Time   COLORURINE YELLOW 05/17/2024 2054   APPEARANCEUR CLEAR 05/17/2024 2054   LABSPEC 1.015 05/17/2024 2054   PHURINE 7.5 05/17/2024 2054   GLUCOSEU 500 (A) 05/17/2024 2054   HGBUR NEGATIVE 05/17/2024 2054   BILIRUBINUR NEGATIVE 05/17/2024 2054   KETONESUR NEGATIVE 05/17/2024 2054   PROTEINUR TRACE (A)  05/17/2024 2054   UROBILINOGEN 0.2 09/27/2012 1638   NITRITE POSITIVE (A) 05/17/2024 2054   LEUKOCYTESUR TRACE (A) 05/17/2024 2054   Sepsis Labs: @LABRCNTIP (procalcitonin:4,lacticidven:4)  ) Recent Results (from the past 240 hours)  Resp panel by RT-PCR (RSV, Flu A&B, Covid) Anterior Nasal Swab     Status: Abnormal   Collection Time: 05/17/24  5:55 PM   Specimen: Anterior Nasal Swab  Result Value Ref Range Status   SARS Coronavirus 2 by RT PCR NEGATIVE NEGATIVE Final    Comment: (NOTE) SARS-CoV-2 target nucleic acids are NOT DETECTED.  The SARS-CoV-2 RNA is generally detectable in upper respiratory specimens during the acute phase of infection. The lowest concentration of SARS-CoV-2 viral copies this assay can detect is 138 copies/mL. A negative result does not preclude SARS-Cov-2 infection and should not be used as the sole basis for treatment or other patient management decisions. A negative result may occur with  improper specimen collection/handling, submission of specimen other than nasopharyngeal swab, presence of viral mutation(s) within the areas targeted by this assay, and inadequate number of viral copies(<138 copies/mL). A negative result must be combined with clinical observations, patient history, and epidemiological information. The expected result is Negative.  Fact Sheet for Patients:  bloggercourse.com  Fact Sheet for Healthcare Providers:  seriousbroker.it  This test is no t yet approved or cleared by the [place]  FDA and  has been authorized for detection and/or diagnosis of SARS-CoV-2 by FDA under an Emergency Use Authorization (EUA). This EUA will remain  in effect (meaning this test can be used) for the duration of the COVID-19 declaration under Section 564(b)(1) of the Act, 21 U.S.C.section 360bbb-3(b)(1), unless the authorization is terminated  or revoked sooner.       Influenza A by PCR  POSITIVE (A) NEGATIVE Final   Influenza B by PCR NEGATIVE NEGATIVE Final    Comment: (NOTE) The Xpert Xpress SARS-CoV-2/FLU/RSV plus assay is intended as an aid in the diagnosis of influenza from Nasopharyngeal swab specimens and should not be used as a sole basis for treatment. Nasal washings and aspirates are unacceptable for Xpert Xpress SARS-CoV-2/FLU/RSV testing.  Fact Sheet for Patients: bloggercourse.com  Fact Sheet for Healthcare Providers: seriousbroker.it  This test is not yet approved or  cleared by the [place]  FDA and has been authorized for detection and/or diagnosis of SARS-CoV-2 by FDA under an Emergency Use Authorization (EUA). This EUA will remain in effect (meaning this test can be used) for the duration of the COVID-19 declaration under Section 564(b)(1) of the Act, 21 U.S.C. section 360bbb-3(b)(1), unless the authorization is terminated or revoked.     Resp Syncytial Virus by PCR NEGATIVE NEGATIVE Final    Comment: (NOTE) Fact Sheet for Patients: bloggercourse.com  Fact Sheet for Healthcare Providers: seriousbroker.it  This test is not yet approved or cleared by the [place]  FDA and has been authorized for detection and/or diagnosis of SARS-CoV-2 by FDA under an Emergency Use Authorization (EUA). This EUA will remain in effect (meaning this test can be used) for the duration of the COVID-19 declaration under Section 564(b)(1) of the Act, 21 U.S.C. section 360bbb-3(b)(1), unless the authorization is terminated or revoked.  Performed at [facility], [address] [place], [place] [address]   Culture, blood (single)     Status: Abnormal (Preliminary result)   Collection Time: 05/17/24  7:15 PM   Specimen: BLOOD LEFT FOREARM  Result Value Ref Range Status   Specimen Description   Final    BLOOD LEFT FOREARM Performed at  [facility], [address], [place], [place] [address]    Special Requests   Final    BOTTLES DRAWN AEROBIC AND ANAEROBIC Blood Culture adequate volume Performed at [facility], [address] [place], [place] [address]    Culture  Setup Time   Final    GRAM POSITIVE COCCI IN CLUSTERS AEROBIC BOTTLE ONLY CRITICAL RESULT CALLED TO, READ BACK BY AND VERIFIED WITH: PHARMD JEREMY F 1435 [id number] FCP GRAM POSITIVE COCCI IN CHAINS ANAEROBIC BOTTLE ONLY RBV JESICCA CARNIE PHARMD 05/19/2024 BY DD @ 0134    Culture (A)  Final    STAPHYLOCOCCUS AUREUS ABIOTROPHIA DEFECTIVA Standardized susceptibility testing for this organism is not available. Performed at [facility], [address] [place], [place] [address]    Report Status PENDING  Incomplete   Organism ID, Bacteria STAPHYLOCOCCUS AUREUS  Final      Susceptibility   Staphylococcus aureus - MIC*    CIPROFLOXACIN  >=8 RESISTANT Resistant     ERYTHROMYCIN <=0.25 SENSITIVE Sensitive     GENTAMICIN <=0.5 SENSITIVE Sensitive     OXACILLIN 0.5 SENSITIVE Sensitive     TETRACYCLINE <=1 SENSITIVE Sensitive     VANCOMYCIN  1 SENSITIVE Sensitive     TRIMETH/SULFA <=10 SENSITIVE Sensitive     CLINDAMYCIN <=0.25 SENSITIVE Sensitive     RIFAMPIN <=0.5 SENSITIVE Sensitive     Inducible Clindamycin NEGATIVE Sensitive     LINEZOLID  2 SENSITIVE Sensitive     * STAPHYLOCOCCUS AUREUS  Blood Culture ID Panel (Reflexed)     Status: Abnormal   Collection Time: 05/17/24  7:15 PM  Result Value Ref Range Status   Enterococcus faecalis NOT DETECTED NOT DETECTED Final   Enterococcus Faecium NOT DETECTED NOT DETECTED Final   Listeria monocytogenes NOT DETECTED NOT DETECTED Final   Staphylococcus species DETECTED (A) NOT DETECTED Final    Comment: CRITICAL RESULT CALLED TO, READ BACK BY AND VERIFIED WITH: PHARMD JEREMY F 1435 [id number] FCP    Staphylococcus aureus (BCID) DETECTED (A) NOT DETECTED Final    Comment: CRITICAL  RESULT CALLED TO, READ BACK BY AND VERIFIED WITH: PHARMD JEREMY F 1435 [id number] FCP    Staphylococcus epidermidis NOT DETECTED NOT DETECTED Final  Staphylococcus lugdunensis NOT DETECTED NOT DETECTED Final   Streptococcus species NOT DETECTED NOT DETECTED Final   Streptococcus agalactiae NOT DETECTED NOT DETECTED Final   Streptococcus pneumoniae NOT DETECTED NOT DETECTED Final   Streptococcus pyogenes NOT DETECTED NOT DETECTED Final   A.calcoaceticus-baumannii NOT DETECTED NOT DETECTED Final   Bacteroides fragilis NOT DETECTED NOT DETECTED Final   Enterobacterales NOT DETECTED NOT DETECTED Final   Enterobacter cloacae complex NOT DETECTED NOT DETECTED Final   Escherichia coli NOT DETECTED NOT DETECTED Final   Klebsiella aerogenes NOT DETECTED NOT DETECTED Final   Klebsiella oxytoca NOT DETECTED NOT DETECTED Final   Klebsiella pneumoniae NOT DETECTED NOT DETECTED Final   Proteus species NOT DETECTED NOT DETECTED Final   Salmonella species NOT DETECTED NOT DETECTED Final   Serratia marcescens NOT DETECTED NOT DETECTED Final   Haemophilus influenzae NOT DETECTED NOT DETECTED Final   Neisseria meningitidis NOT DETECTED NOT DETECTED Final   Pseudomonas aeruginosa NOT DETECTED NOT DETECTED Final   Stenotrophomonas maltophilia NOT DETECTED NOT DETECTED Final   Candida albicans NOT DETECTED NOT DETECTED Final   Candida auris NOT DETECTED NOT DETECTED Final   Candida glabrata NOT DETECTED NOT DETECTED Final   Candida krusei NOT DETECTED NOT DETECTED Final   Candida parapsilosis NOT DETECTED NOT DETECTED Final   Candida tropicalis NOT DETECTED NOT DETECTED Final   Cryptococcus neoformans/gattii NOT DETECTED NOT DETECTED Final   Meth resistant mecA/C and MREJ NOT DETECTED NOT DETECTED Final    Comment: Performed at [facility], [address], [place], [place] [address]  Blood Culture ID Panel (Reflexed)     Status: None   Collection Time: 05/18/24  7:15 PM  Result Value Ref Range  Status   Enterococcus faecalis NOT DETECTED NOT DETECTED Final   Enterococcus Faecium NOT DETECTED NOT DETECTED Final   Listeria monocytogenes NOT DETECTED NOT DETECTED Final   Staphylococcus species NOT DETECTED NOT DETECTED Final   Staphylococcus aureus (BCID) NOT DETECTED NOT DETECTED Final   Staphylococcus epidermidis NOT DETECTED NOT DETECTED Final   Staphylococcus lugdunensis NOT DETECTED NOT DETECTED Final   Streptococcus species NOT DETECTED NOT DETECTED Final   Streptococcus agalactiae NOT DETECTED NOT DETECTED Final   Streptococcus pneumoniae NOT DETECTED NOT DETECTED Final   Streptococcus pyogenes NOT DETECTED NOT DETECTED Final   A.calcoaceticus-baumannii NOT DETECTED NOT DETECTED Final   Bacteroides fragilis NOT DETECTED NOT DETECTED Final   Enterobacterales NOT DETECTED NOT DETECTED Final   Enterobacter cloacae complex NOT DETECTED NOT DETECTED Final   Escherichia coli NOT DETECTED NOT DETECTED Final   Klebsiella aerogenes NOT DETECTED NOT DETECTED Final   Klebsiella oxytoca NOT DETECTED NOT DETECTED Final   Klebsiella pneumoniae NOT DETECTED NOT DETECTED Final   Proteus species NOT DETECTED NOT DETECTED Final   Salmonella species NOT DETECTED NOT DETECTED Final   Serratia marcescens NOT DETECTED NOT DETECTED Final   Haemophilus influenzae NOT DETECTED NOT DETECTED Final   Neisseria meningitidis NOT DETECTED NOT DETECTED Final   Pseudomonas aeruginosa NOT DETECTED NOT DETECTED Final   Stenotrophomonas maltophilia NOT DETECTED NOT DETECTED Final   Candida albicans NOT DETECTED NOT DETECTED Final   Candida auris NOT DETECTED NOT DETECTED Final   Candida glabrata NOT DETECTED NOT DETECTED Final   Candida krusei NOT DETECTED NOT DETECTED Final   Candida parapsilosis NOT DETECTED NOT DETECTED Final   Candida tropicalis NOT DETECTED NOT DETECTED Final   Cryptococcus neoformans/gattii NOT DETECTED NOT DETECTED Final    Comment: Performed at [facility], [address], [place], [place] [address]  Culture, blood (Routine X 2) w Reflex to ID Panel     Status: None (Preliminary result)   Collection Time: 05/19/24  9:04 AM   Specimen: BLOOD LEFT ARM  Result Value Ref Range Status   Specimen Description BLOOD LEFT ARM  Final   Special Requests   Final    BOTTLES DRAWN AEROBIC AND ANAEROBIC Blood Culture adequate volume   Culture   Final    NO GROWTH < 24 HOURS Performed at [facility], [address] [place], [place] [address]    Report Status PENDING  Incomplete  Culture, blood (Routine X 2) w Reflex to ID Panel     Status: None (Preliminary result)   Collection Time: 05/19/24  9:12 AM   Specimen: BLOOD LEFT HAND  Result Value Ref Range Status   Specimen Description BLOOD LEFT HAND  Final   Special Requests   Final    BOTTLES DRAWN AEROBIC AND ANAEROBIC Blood Culture results may not be optimal due to an inadequate volume of blood received in culture bottles   Culture   Final    NO GROWTH < 24 HOURS Performed at [facility], [address], [place], [place] [address]    Report Status PENDING  Incomplete  C Difficile Quick Screen w PCR reflex     Status: Abnormal   Collection Time: 05/19/24  8:29 PM   Specimen: STOOL  Result Value Ref Range Status   C Diff antigen POSITIVE (A) NEGATIVE Final   C Diff toxin NEGATIVE NEGATIVE Final   C Diff interpretation Results are indeterminate. See PCR results.  Final    Comment: Performed at [facility], [address] [place], [place] [address]  C. Diff by PCR, Reflexed     Status: None   Collection Time: 05/19/24  8:29 PM  Result Value Ref Range Status   Toxigenic C. Difficile by PCR NEGATIVE NEGATIVE Final    Comment: Patient is colonized with non toxigenic C. difficile. May not need treatment unless significant symptoms are present.   Hypervirulent Strain PRESUMPTIVE NEGATIVE PRESUMPTIVE NEGATIVE Final    Comment: Performed at [facility], [address], [place], [place]  [address]         Radiology Studies: ECHOCARDIOGRAM COMPLETE Result Date: 05/18/2024    ECHOCARDIOGRAM REPORT   Patient Name:   Hannah [name] Date of Exam: 05/18/2024 Medical Rec #:  [id number]       Height:       60.0 in Accession #:    [id number]      Weight:       91.5 lb Date of Birth:  06/30/1932       BSA:          1.339 m Patient Age:    89 years        BP:           152/93 mmHg Patient Gender: F               HR:           144 bpm. Exam Location:  Inpatient Procedure: 2D Echo, Cardiac Doppler and Color Doppler (Both Spectral and Color            Flow Doppler were utilized during procedure). Indications:    CHF- Acute Systolic  History:        Patient has prior history of Echocardiogram  examinations, most                 recent 05/04/2024. Risk Factors:Hypertension and Diabetes.  Sonographer:    Sherlean Dubin Referring Phys: [id number] SUBRINA SUNDIL  Sonographer Comments: Image acquisition challenging due to respiratory motion. IMPRESSIONS  1. Left ventricular ejection fraction, by estimation, is 60 to 65%. The left ventricle has normal function. The left ventricle has no regional wall motion abnormalities. Left ventricular diastolic parameters are consistent with Grade I diastolic dysfunction (impaired relaxation).  2. Right ventricular systolic function is normal. The right ventricular size is normal.  3. The mitral valve is normal in structure. Mild mitral valve regurgitation. No evidence of mitral stenosis.  4. The aortic valve is tricuspid. Aortic valve regurgitation is not visualized. No aortic stenosis is present.  5. The inferior vena cava is normal in size with greater than 50% respiratory variability, suggesting right atrial pressure of 3 mmHg. FINDINGS  Left Ventricle: Left ventricular ejection fraction, by estimation, is 60 to 65%. The left ventricle has normal function. The left ventricle has no regional wall motion abnormalities. The left ventricular internal cavity size was normal in size.  There is  no left ventricular hypertrophy. Left ventricular diastolic parameters are consistent with Grade I diastolic dysfunction (impaired relaxation). Right Ventricle: The right ventricular size is normal. No increase in right ventricular wall thickness. Right ventricular systolic function is normal. Left Atrium: Left atrial size was normal in size. Right Atrium: Right atrial size was normal in size. Pericardium: There is no evidence of pericardial effusion. Mitral Valve: The mitral valve is normal in structure. Mild mitral valve regurgitation. No evidence of mitral valve stenosis. Tricuspid Valve: The tricuspid valve is normal in structure. Tricuspid valve regurgitation is not demonstrated. No evidence of tricuspid stenosis. Aortic Valve: The aortic valve is tricuspid. Aortic valve regurgitation is not visualized. No aortic stenosis is present. Pulmonic Valve: The pulmonic valve was normal in structure. Pulmonic valve regurgitation is not visualized. No evidence of pulmonic stenosis. Aorta: The aortic root is normal in size and structure. Venous: The inferior vena cava is normal in size with greater than 50% respiratory variability, suggesting right atrial pressure of 3 mmHg. IAS/Shunts: No atrial level shunt detected by color flow Doppler.  LEFT VENTRICLE PLAX 2D LVIDd:         3.70 cm     Diastology LVIDs:         2.70 cm     LV e' medial:    8.55 cm/s LV PW:         0.90 cm     LV E/e' medial:  7.4 LV IVS:        0.80 cm     LV e' lateral:   5.28 cm/s LVOT diam:     2.00 cm     LV E/e' lateral: 12.0 LV SV:         36 LV SV Index:   27 LVOT Area:     3.14 cm LV IVRT:       137 msec  LV Volumes (MOD) LV vol d, MOD A2C: 40.7 ml LV vol d, MOD A4C: 32.1 ml LV vol s, MOD A2C: 18.3 ml LV vol s, MOD A4C: 11.4 ml LV SV MOD A2C:     22.4 ml LV SV MOD A4C:     32.1 ml LV SV MOD BP:      22.4 ml RIGHT VENTRICLE  IVC RV Basal diam:  2.70 cm     IVC diam: 0.90 cm RV Mid diam:    2.40 cm RV S prime:     12.60  cm/s TAPSE (M-mode): 1.4 cm LEFT ATRIUM             Index        RIGHT ATRIUM          Index LA diam:        2.10 cm 1.57 cm/m   RA Area:     6.18 cm LA Vol (A2C):   15.5 ml 11.58 ml/m  RA Volume:   10.80 ml 8.07 ml/m LA Vol (A4C):   13.0 ml 9.71 ml/m LA Biplane Vol: 15.6 ml 11.65 ml/m  AORTIC VALVE LVOT Vmax:   65.90 cm/s LVOT Vmean:  40.100 cm/s LVOT VTI:    0.115 m  AORTA Ao Root diam: 2.80 cm Ao Asc diam:  2.70 cm MITRAL VALVE               TRICUSPID VALVE MV Area (PHT): 4.80 cm    TR Peak grad:   6.4 mmHg MV Decel Time: 158 msec    TR Vmax:        126.00 cm/s MR Peak grad: 54.3 mmHg MR Vmax:      368.50 cm/s  SHUNTS MV E velocity: 63.10 cm/s  Systemic VTI:  0.12 m MV A velocity: 68.40 cm/s  Systemic Diam: 2.00 cm MV E/A ratio:  0.92 Morene Brownie Electronically signed by Morene Brownie Signature Date/Time: 05/18/2024/4:50:12 PM    Final         Scheduled Meds:  amLODipine   2.5 mg Oral Daily   ascorbic acid   500 mg Oral Daily   budesonide   0.5 mg Nebulization BID   chlorpheniramine-HYDROcodone   5 mL Oral Q12H   donepezil   5 mg Oral QHS   ezetimibe   10 mg Oral QHS   And   simvastatin   20 mg Oral QHS   guaiFENesin   10 mL Oral Q8H   heparin   5,000 Units Subcutaneous Q8H   insulin  aspart  0-20 Units Subcutaneous TID WC   insulin  glargine  25 Units Subcutaneous QHS   ipratropium-albuterol   3 mL Nebulization Q6H   lactobacillus  1 g Oral TID WC   latanoprost   1 drop Both Eyes QHS   levothyroxine   50 mcg Oral See admin instructions   magnesium  oxide  400 mg Oral Daily   megestrol   600 mg Oral QAC supper   methylPREDNISolone  (SOLU-MEDROL ) injection  40 mg Intravenous Q12H   metoprolol  tartrate  25 mg Oral BID   oseltamivir   30 mg Oral Daily   oxybutynin   10 mg Oral Daily   pantoprazole   40 mg Oral Daily   sodium chloride  flush  3 mL Intravenous Q12H   sodium chloride  flush  3 mL Intravenous Q12H   zinc  sulfate (50mg  elemental zinc )  220 mg Oral Daily   Continuous Infusions:    ceFAZolin  (ANCEF ) IV 2 g (05/20/24 0335)     LOS: 2 days    Time spent: 55 minutes.    Leatrice Chapel, MD  [facility] 7PM-7AM contact night coverage as above    "

## 2024-05-20 NOTE — Plan of Care (Signed)

## 2024-05-20 NOTE — Progress Notes (Addendum)
 "   [facility]    Date of Admission:  05/17/2024   Total days of antibiotics 4           ID: Hannah [name] is a 89 y.o. female with  influenza A complicated by MSSA pneumonia and secondary bacteremia Principal Problem:   Sepsis due to pneumonia [facility]) Active Problems:   Chronic obstructive asthma (HCC)   Hypothyroidism   Hypertension   Insulin  dependent type 2 diabetes mellitus (HCC)   CKD stage 3a, GFR 45-59 ml/min (HCC)   Iron  deficiency anemia   Acute hypoxic respiratory failure (HCC)   UTI (urinary tract infection)   Influenza A with pneumonia    Subjective: Remains sleepy today. Patient having diarrhea  WBC at 17K, cdifficile testing PCR negative but GDH ag +  Abiotrophia -nutrient deficient strep also isolated from blood cx  Medications:   amLODipine   2.5 mg Oral Daily   ascorbic acid   500 mg Oral Daily   budesonide   0.5 mg Nebulization BID   chlorpheniramine-HYDROcodone   5 mL Oral Q12H   donepezil   5 mg Oral QHS   ezetimibe   10 mg Oral QHS   And   simvastatin   20 mg Oral QHS   furosemide   40 mg Intravenous Once   guaiFENesin   10 mL Oral Q8H   heparin   5,000 Units Subcutaneous Q8H   insulin  aspart  0-20 Units Subcutaneous TID WC   insulin  glargine  25 Units Subcutaneous QHS   ipratropium-albuterol   3 mL Nebulization Q6H   lactobacillus  1 g Oral TID WC   latanoprost   1 drop Both Eyes QHS   levothyroxine   50 mcg Oral See admin instructions   magnesium  oxide  400 mg Oral Daily   megestrol   600 mg Oral QAC supper   methylPREDNISolone  (SOLU-MEDROL ) injection  40 mg Intravenous Q12H   metoprolol  tartrate  25 mg Oral BID   oseltamivir   30 mg Oral Daily   oxybutynin   10 mg Oral Daily   pantoprazole   40 mg Oral Daily   sodium chloride  flush  3 mL Intravenous Q12H   sodium chloride  flush  3 mL Intravenous Q12H   zinc  sulfate (50mg  elemental zinc )  220 mg Oral Daily    Objective: Vital signs in last 24 hours: Temp:  [98.2 F (36.8  C)-98.4 F (36.9 C)] 98.4 F (36.9 C) (01/04 0331) Pulse Rate:  [65-90] 72 (01/04 1322) Resp:  [11-20] 16 (01/04 1322) BP: (129-150)/(62-86) 145/72 (01/04 1322) SpO2:  [93 %-100 %] 100 % (01/04 1434) Weight:  [40.7 kg] 40.7 kg (01/04 0500)  Physical Exam  Constitutional:  oriented to person, place, and time. appears well-developed and well-nourished. No distress.  HENT: [place]/AT, PERRLA, no scleral icterus Mouth/Throat: Oropharynx is clear and moist. No oropharyngeal exudate.  Cardiovascular: Normal rate, regular rhythm and normal heart sounds. Exam reveals no gallop and no friction rub.  No murmur heard.  Pulmonary/Chest: Effort normal and breath sounds normal. No respiratory distress.  has no wheezes.  Neck = supple, no nuchal rigidity Abdominal: Soft. Bowel sounds are normal.  exhibits no distension. There is no tenderness.  Skin: Skin is warm and dry. No rash noted. No erythema.    Lab Results Recent Labs    05/19/24 0904 05/20/24 0215  WBC 10.3 16.9*  HGB 12.0 10.3*  HCT 36.5 31.0*  NA 138 139  K 3.5 3.6  CL 99 103  CO2 24 26  BUN 13 15  CREATININE 1.11* 0.98  Liver Panel Recent Labs    05/18/24 0315  PROT 5.5*  ALBUMIN 3.2*  AST 47*  ALT 31  ALKPHOS 49  BILITOT <0.2   Sedimentation Rate Recent Labs    05/18/24 1641  ESRSEDRATE 35*   C-Reactive Protein Recent Labs    05/18/24 1641  CRP 7.5*    Microbiology: Reviewed1/3 blood cx ngtd 1/1 blood cx MSSA 1/2 blood cx NGTD Studies/Results: DG CHEST PORT 1 VIEW Result Date: 05/20/2024 EXAM: 1 VIEW(S) XRAY OF THE CHEST 05/20/2024 12:14:01 PM COMPARISON: 05/17/2024 CLINICAL HISTORY: SOB (shortness of breath) FINDINGS: LUNGS AND PLEURA: Chronic coarsened interstitial markings without pulmonary edema. Bronchial wall thickening and reticulonodular opacities in the lower lungs. No pleural effusion. No pneumothorax. HEART AND MEDIASTINUM: Atherosclerotic plaque noted. No acute abnormality of the cardiac and  mediastinal silhouettes. BONES AND SOFT TISSUES: No acute osseous abnormality. IMPRESSION: 1. Findings compatible with small airway infection/inflammation similar to CT 1 / 1 / 26 . Electronically signed by: Norman Gatlin MD 05/20/2024 12:37 PM EST RP Workstation: [id number]   ECHOCARDIOGRAM COMPLETE Result Date: 05/18/2024    ECHOCARDIOGRAM REPORT   Patient Name:   Hannah [name] Date of Exam: 05/18/2024 Medical Rec #:  [id number]       Height:       60.0 in Accession #:    [id number]      Weight:       91.5 lb Date of Birth:  01-Sep-1932       BSA:          1.339 m Patient Age:    89 years        BP:           152/93 mmHg Patient Gender: F               HR:           144 bpm. Exam Location:  Inpatient Procedure: 2D Echo, Cardiac Doppler and Color Doppler (Both Spectral and Color            Flow Doppler were utilized during procedure). Indications:    CHF- Acute Systolic  History:        Patient has prior history of Echocardiogram examinations, most                 recent 05/04/2024. Risk Factors:Hypertension and Diabetes.  Sonographer:    Sherlean Dubin Referring Phys: [id number] SUBRINA SUNDIL  Sonographer Comments: Image acquisition challenging due to respiratory motion. IMPRESSIONS  1. Left ventricular ejection fraction, by estimation, is 60 to 65%. The left ventricle has normal function. The left ventricle has no regional wall motion abnormalities. Left ventricular diastolic parameters are consistent with Grade I diastolic dysfunction (impaired relaxation).  2. Right ventricular systolic function is normal. The right ventricular size is normal.  3. The mitral valve is normal in structure. Mild mitral valve regurgitation. No evidence of mitral stenosis.  4. The aortic valve is tricuspid. Aortic valve regurgitation is not visualized. No aortic stenosis is present.  5. The inferior vena cava is normal in size with greater than 50% respiratory variability, suggesting right atrial pressure of 3 mmHg. FINDINGS  Left  Ventricle: Left ventricular ejection fraction, by estimation, is 60 to 65%. The left ventricle has normal function. The left ventricle has no regional wall motion abnormalities. The left ventricular internal cavity size was normal in size. There is  no left ventricular hypertrophy. Left ventricular diastolic parameters are consistent with Grade I diastolic dysfunction (impaired relaxation). Right Ventricle:  The right ventricular size is normal. No increase in right ventricular wall thickness. Right ventricular systolic function is normal. Left Atrium: Left atrial size was normal in size. Right Atrium: Right atrial size was normal in size. Pericardium: There is no evidence of pericardial effusion. Mitral Valve: The mitral valve is normal in structure. Mild mitral valve regurgitation. No evidence of mitral valve stenosis. Tricuspid Valve: The tricuspid valve is normal in structure. Tricuspid valve regurgitation is not demonstrated. No evidence of tricuspid stenosis. Aortic Valve: The aortic valve is tricuspid. Aortic valve regurgitation is not visualized. No aortic stenosis is present. Pulmonic Valve: The pulmonic valve was normal in structure. Pulmonic valve regurgitation is not visualized. No evidence of pulmonic stenosis. Aorta: The aortic root is normal in size and structure. Venous: The inferior vena cava is normal in size with greater than 50% respiratory variability, suggesting right atrial pressure of 3 mmHg. IAS/Shunts: No atrial level shunt detected by color flow Doppler.  LEFT VENTRICLE PLAX 2D LVIDd:         3.70 cm     Diastology LVIDs:         2.70 cm     LV e' medial:    8.55 cm/s LV PW:         0.90 cm     LV E/e' medial:  7.4 LV IVS:        0.80 cm     LV e' lateral:   5.28 cm/s LVOT diam:     2.00 cm     LV E/e' lateral: 12.0 LV SV:         36 LV SV Index:   27 LVOT Area:     3.14 cm LV IVRT:       137 msec  LV Volumes (MOD) LV vol d, MOD A2C: 40.7 ml LV vol d, MOD A4C: 32.1 ml LV vol s, MOD A2C:  18.3 ml LV vol s, MOD A4C: 11.4 ml LV SV MOD A2C:     22.4 ml LV SV MOD A4C:     32.1 ml LV SV MOD BP:      22.4 ml RIGHT VENTRICLE             IVC RV Basal diam:  2.70 cm     IVC diam: 0.90 cm RV Mid diam:    2.40 cm RV S prime:     12.60 cm/s TAPSE (M-mode): 1.4 cm LEFT ATRIUM             Index        RIGHT ATRIUM          Index LA diam:        2.10 cm 1.57 cm/m   RA Area:     6.18 cm LA Vol (A2C):   15.5 ml 11.58 ml/m  RA Volume:   10.80 ml 8.07 ml/m LA Vol (A4C):   13.0 ml 9.71 ml/m LA Biplane Vol: 15.6 ml 11.65 ml/m  AORTIC VALVE LVOT Vmax:   65.90 cm/s LVOT Vmean:  40.100 cm/s LVOT VTI:    0.115 m  AORTA Ao Root diam: 2.80 cm Ao Asc diam:  2.70 cm MITRAL VALVE               TRICUSPID VALVE MV Area (PHT): 4.80 cm    TR Peak grad:   6.4 mmHg MV Decel Time: 158 msec    TR Vmax:        126.00 cm/s MR Peak grad: 54.3 mmHg MR Vmax:  368.50 cm/s  SHUNTS MV E velocity: 63.10 cm/s  Systemic VTI:  0.12 m MV A velocity: 68.40 cm/s  Systemic Diam: 2.00 cm MV E/A ratio:  0.92 Morene Brownie Electronically signed by Morene Brownie Signature Date/Time: 05/18/2024/4:50:12 PM    Final      Assessment/Plan: Influenza A with MSSA pna/transient bacteremia = continue on cefazolin  2gm IV q8hr. Given age she is unlikely to be good candidate for TEE.TTE did not show any evidence of vegetation. If repeat blood cx are NGTD on 1/2 and 1/3, would not recommend to get TEE since risk greater for her age and comorbidities. Plan to treat for 14 days -oseltamivir  for 7 days  Abiotrophia = likely contaminant but will continue to monitor follow up cx.  Diarrhea= has ruled out cdifficile. Likely abtx associated diarrhea. Can give a trial of immodium to see if can help with symptoms  Leukocytosis = from solumedrol  Keep on droplet isolation  evaluation of this patient requires complex antimicrobial therapy evaluation and counseling and isolation needs for disease transmission risk assessment and mitigation.   I  personally spent a total of 40 minutes in the care of the patient today including preparing to see the patient, getting/reviewing separately obtained history, performing a medically appropriate exam/evaluation, referring and communicating with other health care professionals, documenting clinical information in the EHR, independently interpreting results, and communicating results.   Montie Bologna Internal [facility] Teaching Service/Infectious Disease Pager: [phone]  05/20/2024, 3:11 PM      "

## 2024-05-20 NOTE — Progress Notes (Signed)
 Mobility Specialist: Progress Note   05/20/24 1000  Mobility  Activity Ambulated with assistance;Pivoted/transferred from bed to chair;Pivoted/transferred to/from [facility]  Level of Assistance Moderate assist, patient does 50-74%  Assistive Device Front wheel walker  Distance Ambulated (ft) 10 ft  Activity Response Tolerated fair  Mobility Referral Yes  Mobility visit 1 Mobility  Mobility Specialist Start Time (ACUTE ONLY) [id number]  Mobility Specialist Stop Time (ACUTE ONLY) 0950  Mobility Specialist Time Calculation (min) (ACUTE ONLY) 45 min    During Mobility: HR 150-178  Pt received in bed, agreeable to mobility session. Daughter present and helpful with translating. ModA for bed mobility to assist BLE and trunk elevation - able to scoot herself EOB well. [name] for STS. Bed soiled and pt incontinent of bowels and urine upon standing. Needing MAX cues to follow directions and guide herself to [facility]. Completed additional void and BM on BSC. [name] for STS from [facility] and totA for pericare in standing. ModA for short distance ambulation to steady and steer RW. Cues needed for hand placement as well. Attempted additional room ambulation but pt HR peaking to the 170s and pt symptomatic of fatigue and SOB. Further ambulation deferred, rolled recliner chair over and had pt complete a step pivot transfer. NT assisted with clean up and linen change. Left in chair with all needs met, call bell in reach.   Ileana Lute Mobility Specialist Please contact via SecureChat or Rehab office at [phone]

## 2024-05-21 DIAGNOSIS — Z7189 Other specified counseling: Secondary | ICD-10-CM

## 2024-05-21 DIAGNOSIS — J189 Pneumonia, unspecified organism: Secondary | ICD-10-CM | POA: Diagnosis not present

## 2024-05-21 DIAGNOSIS — Z515 Encounter for palliative care: Secondary | ICD-10-CM | POA: Diagnosis not present

## 2024-05-21 DIAGNOSIS — A419 Sepsis, unspecified organism: Secondary | ICD-10-CM | POA: Diagnosis not present

## 2024-05-21 LAB — GLUCOSE, CAPILLARY
Glucose-Capillary: 163 mg/dL — ABNORMAL HIGH (ref 70–99)
Glucose-Capillary: 206 mg/dL — ABNORMAL HIGH (ref 70–99)
Glucose-Capillary: 194 mg/dL — ABNORMAL HIGH (ref 70–99)
Glucose-Capillary: 348 mg/dL — ABNORMAL HIGH (ref 70–99)

## 2024-05-21 LAB — CBC WITH DIFFERENTIAL/PLATELET
Abs Immature Granulocytes: 1.05 K/uL — ABNORMAL HIGH (ref 0.00–0.07)
Basophils Absolute: 0.1 K/uL (ref 0.0–0.1)
Basophils Relative: 0 %
Eosinophils Absolute: 0 K/uL (ref 0.0–0.5)
Eosinophils Relative: 0 %
HCT: 30.9 % — ABNORMAL LOW (ref 36.0–46.0)
Hemoglobin: 10.2 g/dL — ABNORMAL LOW (ref 12.0–15.0)
Immature Granulocytes: 7 %
Lymphocytes Relative: 8 %
Lymphs Abs: 1.2 K/uL (ref 0.7–4.0)
MCH: 29 pg (ref 26.0–34.0)
MCHC: 33 g/dL (ref 30.0–36.0)
MCV: 87.8 fL (ref 80.0–100.0)
Monocytes Absolute: 1.4 K/uL — ABNORMAL HIGH (ref 0.1–1.0)
Monocytes Relative: 9 %
Neutro Abs: 12.3 K/uL — ABNORMAL HIGH (ref 1.7–7.7)
Neutrophils Relative %: 76 %
Platelets: 290 K/uL (ref 150–400)
RBC: 3.52 MIL/uL — ABNORMAL LOW (ref 3.87–5.11)
RDW: 16.9 % — ABNORMAL HIGH (ref 11.5–15.5)
WBC: 16 K/uL — ABNORMAL HIGH (ref 4.0–10.5)
nRBC: 0.1 % (ref 0.0–0.2)

## 2024-05-21 LAB — GASTROINTESTINAL PANEL BY PCR, STOOL (REPLACES STOOL CULTURE)

## 2024-05-21 LAB — RENAL FUNCTION PANEL
Albumin: 3.2 g/dL — ABNORMAL LOW (ref 3.5–5.0)
Anion gap: 12 (ref 5–15)
BUN: 18 mg/dL (ref 8–23)
CO2: 26 mmol/L (ref 22–32)
Calcium: 8.2 mg/dL — ABNORMAL LOW (ref 8.9–10.3)
Chloride: 101 mmol/L (ref 98–111)
Creatinine, Ser: 1.04 mg/dL — ABNORMAL HIGH (ref 0.44–1.00)
GFR, Estimated: 51 mL/min — ABNORMAL LOW
Glucose, Bld: 192 mg/dL — ABNORMAL HIGH (ref 70–99)
Phosphorus: 1.4 mg/dL — ABNORMAL LOW (ref 2.5–4.6)
Potassium: 3.1 mmol/L — ABNORMAL LOW (ref 3.5–5.1)
Sodium: 139 mmol/L (ref 135–145)

## 2024-05-21 LAB — MAGNESIUM: Magnesium: 1.8 mg/dL (ref 1.7–2.4)

## 2024-05-21 MED ORDER — NYSTATIN 100000 UNIT/ML MT SUSP
5.0000 mL | Freq: Four times a day (QID) | OROMUCOSAL | Status: DC
  Administered 2024-05-21 – 2024-05-24 (×13): 500000 [IU] via ORAL
  Filled 2024-05-21 (×14): qty 5

## 2024-05-21 MED ORDER — ENSURE PLUS HIGH PROTEIN PO LIQD
237.0000 mL | Freq: Two times a day (BID) | ORAL | Status: DC
  Administered 2024-05-22 – 2024-05-26 (×8): 237 mL via ORAL

## 2024-05-21 MED ORDER — MAGNESIUM SULFATE 2 GM/50ML IV SOLN
2.0000 g | Freq: Once | INTRAVENOUS | Status: AC
  Administered 2024-05-21: 2 g via INTRAVENOUS
  Filled 2024-05-21: qty 50

## 2024-05-21 MED ORDER — POTASSIUM CHLORIDE 20 MEQ PO PACK
40.0000 meq | PACK | Freq: Once | ORAL | Status: AC
  Administered 2024-05-21: 40 meq via ORAL
  Filled 2024-05-21: qty 2

## 2024-05-21 MED ORDER — FLUCONAZOLE 50 MG PO TABS
50.0000 mg | ORAL_TABLET | Freq: Every day | ORAL | Status: DC
  Filled 2024-05-21: qty 1

## 2024-05-21 MED ORDER — POTASSIUM CHLORIDE 10 MEQ/100ML IV SOLN
10.0000 meq | INTRAVENOUS | Status: AC
  Administered 2024-05-21 (×4): 10 meq via INTRAVENOUS
  Filled 2024-05-21 (×4): qty 100

## 2024-05-21 MED ORDER — FUROSEMIDE 10 MG/ML IJ SOLN
20.0000 mg | Freq: Once | INTRAMUSCULAR | Status: AC
  Administered 2024-05-21: 20 mg via INTRAVENOUS
  Filled 2024-05-21: qty 2

## 2024-05-21 MED ORDER — K PHOS MONO-SOD PHOS DI & MONO 155-852-130 MG PO TABS
250.0000 mg | ORAL_TABLET | Freq: Four times a day (QID) | ORAL | Status: AC
  Administered 2024-05-21 (×4): 250 mg via ORAL
  Filled 2024-05-21 (×4): qty 1

## 2024-05-21 NOTE — Consult Note (Addendum)
 "  Cardiology Consultation   Patient ID: Hannah [name] MRN: [id number]; DOB: [birth year]  Admit date: 05/17/2024 Date of Consult: 05/21/2024  PCP:  Sun, Vyvyan, MD   [facility] Providers Cardiologist:  Gordy Bergamo, MD      Patient Profile: Hannah [name] is a 89 y.o. female with a hx of HFpEF, HTN, asthma, COPD, mild-moderate MR, hypothyroidism, insulin -dependent type 2 DM, CKD stage IIIa who is being seen 05/21/2024 for the evaluation of CHF at the request of Dr. Rosario   History of Present Illness: Hannah [name] is an 89 year old female with above medical history who is followed by Dr. Bergamo.   Patient previously underwent R/L heart catheterization in 09/2019 that showed normal coronary arteries, elevated LVEDP. She was started on diuretics for diastolic heart failure. Echocardiogram in 02/2021 showed EF 55-60%, no regional wall motion abnormalities, grade I DD, normal RV systolic function.   She was last seen by cardiology as an outpatient on 03/09/24. AT that time, patient continued to have lower extremity swelling. Weights had overall been stable. She remained on lasix  20 mg once per week with instructions to take extra for shortness of breath, weight gain, worsening swelling.   Her most recent echocardiogram from 05/04/24 showed EF 60-65%, no regional wall motion abnormalities, grade I DD, normal RV systolic function, mild-moderate MR.   Patient presented to the ED on 1/1 complaining of shortness of breath and wheezing. Also reported having altered mental status, multiple falls over the past 2 days. In the ED, she tested positive for Flu A. She was admitted to the hospitalist service with a working diagnosis of sepsis due to pneumonia from Flu A. She also was found to have a UTI. Blood cultures grew MSSA and infectious disease was consulted. Treated with IV cefazolin  and oseltamivir . She has develop diarrhea, ruled out c dif. She also developed oral thrush.   Patient underwent  echocardiogram on 05/18/24 that showed EF 60-65%, no wall motion abnormalities, normal RV systolic function, mild MR. She received one dose of IV lasix  yesterday.  Family asked for cardiology consultation.   Patient asleep at the time of my evaluation. Discussed with her daughter at bedside. Per daughter, patient has had some ankle/foot swelling since 11/2023. Was started on low dose lasix , but continued to have swelling. Her lasix  was recently increased to 20 mg three times per week. She has been having some shortness of breath for several months. Was seen by pulmonology and started on inhalers with improvement. On 1/1, patient was more short of breath and wheezy than usual. Family tried to get patient to eat lunch, but she said she was not feeling well. Attempted to use inhalers without improvement. Family decided to take patient to the ED. She was given IV fluids in the ED. The next morning, she had swelling in her ankles, abdomen, and hands. Since then, patient has received IV lasix  with improvement.   Past Medical History:  Diagnosis Date   COVID-19    Depression    Diabetes mellitus    Hypercholesteremia    Hypertension    Hypothyroidism    Oral cancer (HCC) 2012   surgery and radiation   Spinal stenosis    Tuberculosis    1978   Weight loss, unintentional     Past Surgical History:  Procedure Laterality Date   AORTIC ARCH ANGIOGRAPHY  09/2019   BACK SURGERY     COLONOSCOPY     EYE SURGERY Bilateral  cataracts   LIPOMA EXCISION     back   LUMBAR LAMINECTOMY/DECOMPRESSION MICRODISCECTOMY Right 04/12/2018   Procedure: Laminectomy and Foraminotomy - Lumbar four-Lumbar five - Lumbar five-Sacral one - right;  Surgeon: Joshua Alm RAMAN, MD;  Location: [facility] OR;  Service: Neurosurgery;  Laterality: Right;   LUMBAR SPINE SURGERY  05/2019   MASS EXCISION Left 04/02/2019   Procedure: EXCISION OF LIPOMATOUS MASS OF THE LEFT BACK;  Surgeon: Gladis Cough, MD;  Location: [facility];  Service: General;  Laterality: Left;   OTHER SURGICAL HISTORY     squamous cell removal;skin graft   RIGHT/LEFT HEART CATH AND CORONARY ANGIOGRAPHY N/A 09/18/2019   Procedure: RIGHT/LEFT HEART CATH AND CORONARY ANGIOGRAPHY;  Surgeon: Ladona Heinz, MD;  Location: [facility];  Service: Cardiovascular;  Laterality: N/A;   TOOTH EXTRACTION     all pulled     Home Medications:  Prior to Admission medications  Medication Sig Start Date End Date Taking? Authorizing Provider  albuterol  (PROVENTIL ) (2.5 MG/3ML) 0.083% nebulizer solution Take 3 mLs (2.5 mg total) by nebulization every 4 (four) hours as needed for wheezing or shortness of breath. 04/09/24 04/09/25 Yes Horton, Roxie HERO, DO  albuterol  (VENTOLIN  HFA) 108 (90 Base) MCG/ACT inhaler Inhale 2 puffs into the lungs 3 (three) times daily for 3 days, THEN 2 puffs every 6 (six) hours as needed for wheezing or shortness of breath. 12/28/23 12/30/24 Yes Hattar, Zola SAILOR, MD  amLODipine  (NORVASC ) 2.5 MG tablet Take 1 tablet (2.5 mg total) by mouth daily. 12/04/23  Yes Sebastian Toribio GAILS, MD  arformoterol  (BROVANA ) 15 MCG/2ML NEBU Take 2 mLs (15 mcg total) by nebulization 2 (two) times daily. 02/17/24  Yes Hattar, Zola SAILOR, MD  BAQSIMI ONE PACK 3 MG/DOSE POWD 1 spray by Other route See admin instructions. Use 1 spray in 1 nostril once as directed for severe low blood sugar (hypoglycemia). Administer 1 spray into affected nostril(s) as needed for hypoglycemic emergencies. 03/09/24  Yes [provider]  benzonatate (TESSALON) 100 MG capsule Take 100 mg by mouth 3 (three) times daily as needed for cough.   Yes [provider]  budesonide  (PULMICORT ) 0.5 MG/2ML nebulizer solution Take 2 mLs (0.5 mg total) by nebulization in the morning and at bedtime. 01/01/24  Yes Hattar, Zola SAILOR, MD  Ca Carbonate-Mag Hydroxide 1000-200 MG CHEW Chew 1 tablet by mouth daily.   Yes [provider]  Cholecalciferol  (VITAMIN D -3) 125 MCG (5000  UT) TABS Take 5,000 Units by mouth daily.   Yes [provider]  Coenzyme Q10 (CO Q 10 PO) Take 1 capsule by mouth daily.   Yes [provider]  Cyanocobalamin  (B-12 SL) Place 1 tablet under the tongue daily.   Yes [provider]  donepezil  (ARICEPT ) 5 MG tablet Take 5 mg by mouth at bedtime.   Yes [provider]  ezetimibe -simvastatin  (VYTORIN ) 10-20 MG per tablet Take 1 tablet by mouth at bedtime.   Yes [provider]  Finerenone  (KERENDIA ) 10 MG TABS Take 1 tablet (10 mg total) by mouth daily. 03/09/24  Yes Carlin Delon BROCKS, NP  furosemide  (LASIX ) 20 MG tablet TAKE 2 TO 3 TIMES WEEKLY Patient taking differently: Take 20 mg by mouth daily. 03/09/24  Yes Carlin Delon BROCKS, NP  HYDROcodone -acetaminophen  (NORCO/VICODIN) 5-325 MG tablet Take 1 tablet by mouth 2 (two) times daily. 10/24/23  Yes [provider]  insulin  glargine (LANTUS  SOLOSTAR) 100 UNIT/ML Solostar Pen Inject 20 Units into the skin at  bedtime. Patient taking differently: Inject 14 Units into the skin at bedtime. 12/04/23  Yes Sebastian Toribio GAILS, MD  insulin  lispro (HUMALOG) 100 UNIT/ML KwikPen Inject 2-12 Units into the skin See admin instructions. Inject 2-12 units into the skin three times a day with meals, per sliding scale   Yes [provider]  JANUVIA 50 MG tablet Take 50 mg by mouth daily.   Yes [provider]  latanoprost  (XALATAN ) 0.005 % ophthalmic solution Place 1 drop into both eyes at bedtime. 02/20/21  Yes [provider]  levothyroxine  (SYNTHROID ) 50 MCG tablet Take 1 tablet (50 mcg total) by mouth See admin instructions. Take 25mcg alternating 50mcg every other day. 03/03/21  Yes Gherghe, Costin M, MD  lidocaine  (XYLOCAINE ) 2 % solution Use as directed 3 mLs in the mouth or throat daily as needed for mouth pain. 04/28/18  Yes [provider]  magnesium  oxide (MAG-OX) 400 (240 Mg) MG tablet Take 400 mg by mouth daily.   Yes  [provider]  megestrol  (MEGACE ) 400 MG/10ML suspension Take 600 mg by mouth daily before supper.   Yes [provider]  omeprazole  (PRILOSEC) 20 MG capsule Take 1 capsule (20 mg total) by mouth every other day. 12/04/23  Yes Sebastian Toribio GAILS, MD  ondansetron  (ZOFRAN ) 4 MG tablet Take 1 tablet (4 mg total) by mouth every 8 (eight) hours as needed for nausea or vomiting. 02/15/23  Yes Jerrol Agent, MD  oxybutynin  (DITROPAN -XL) 10 MG 24 hr tablet Take 10 mg by mouth daily. 02/12/24  Yes [provider]  simethicone  (MYLICON) 80 MG chewable tablet Chew 1 tablet (80 mg total) by mouth every 6 (six) hours as needed for flatulence. 12/03/23  Yes Sebastian Toribio GAILS, MD  Vibegron (GEMTESA) 75 MG TABS Take 75 mg by mouth daily.   Yes [provider]  Continuous Glucose Sensor (FREESTYLE LIBRE 14 DAY SENSOR) MISC Inject 1 Device into the skin every 14 (fourteen) days. 11/18/23   [provider]  diclofenac  sodium (VOLTAREN ) 1 % GEL Apply 2 g topically 3 (three) times daily as needed (joint pain- affected areas).    [provider]  doxycycline  (VIBRAMYCIN ) 100 MG capsule Take 1 capsule (100 mg total) by mouth 2 (two) times daily. Patient not taking: Reported on 05/18/2024 04/09/24   Horton, Roxie HERO, DO  metoprolol  tartrate (LOPRESSOR ) 25 MG tablet Take 1 tablet (25 mg total) by mouth 2 (two) times daily. Patient not taking: Reported on 05/18/2024 03/03/21   Gherghe, Costin M, MD  predniSONE  (DELTASONE ) 10 MG tablet Take 2 tablets (20 mg total) by mouth daily. Patient not taking: Reported on 05/18/2024 04/09/24   Horton, Kristie M, DO    Scheduled Meds:  amLODipine   2.5 mg Oral Daily   ascorbic acid   500 mg Oral Daily   budesonide   0.5 mg Nebulization BID   chlorpheniramine-HYDROcodone   5 mL Oral Q12H   donepezil   5 mg Oral QHS   ezetimibe   10 mg Oral QHS   And   simvastatin   20 mg Oral QHS   fluconazole   50 mg Oral Daily   guaiFENesin   10 mL Oral  Q8H   heparin   5,000 Units Subcutaneous Q8H   insulin  aspart  0-20 Units Subcutaneous TID WC   insulin  aspart  0-5 Units Subcutaneous QHS   insulin  glargine-yfgn  25 Units Subcutaneous QHS   ipratropium-albuterol   3 mL Nebulization Q6H   lactobacillus  1 g Oral TID WC   latanoprost   1  drop Both Eyes QHS   levothyroxine   50 mcg Oral See admin instructions   magnesium  oxide  400 mg Oral Daily   megestrol   600 mg Oral QAC supper   methylPREDNISolone  (SOLU-MEDROL ) injection  40 mg Intravenous Q12H   metoprolol  tartrate  25 mg Oral BID   nystatin   5 mL Oral QID   oseltamivir   30 mg Oral Daily   oxybutynin   10 mg Oral Daily   pantoprazole   40 mg Oral Daily   phosphorus  250 mg Oral QID   sodium chloride  flush  3 mL Intravenous Q12H   sodium chloride  flush  3 mL Intravenous Q12H   zinc  sulfate (50mg  elemental zinc )  220 mg Oral Daily   Continuous Infusions:   ceFAZolin  (ANCEF ) IV Stopped (05/21/24 0357)   potassium chloride  10 mEq (05/21/24 1319)   PRN Meds: acetaminophen  **OR** acetaminophen , albuterol , hydrALAZINE , HYDROmorphone  (DILAUDID ) injection, ondansetron  **OR** ondansetron  (ZOFRAN ) IV, oxyCODONE , senna-docusate, traZODone   Allergies:   Allergies[1]  Social History:   Social History   Socioeconomic History   Marital status: Widowed    Spouse name: Not on file   Number of children: 4   Years of education: Not on file   Highest education level: Not on file  Occupational History   Not on file  Tobacco Use   Smoking status: Never    Passive exposure: Past   Smokeless tobacco: Never  Vaping Use   Vaping status: Never Used  Substance and Sexual Activity   Alcohol use: No    Alcohol/week: 0.0 standard drinks of alcohol   Drug use: No   Sexual activity: Never  Other Topics Concern   Not on file  Social History Narrative   Lives at home with two daughters.      Social Drivers of Health   Tobacco Use: Low Risk (05/17/2024)   Patient History    Smoking Tobacco Use:  Never    Smokeless Tobacco Use: Never    Passive Exposure: Past  Financial Resource Strain: Not on file  Food Insecurity: Patient Declined (05/18/2024)   Epic    Worried About Programme Researcher, Broadcasting/film/video in the Last Year: Patient declined    Barista in the Last Year: Patient declined  Transportation Needs: Unknown (05/18/2024)   Epic    Lack of Transportation (Medical): Not on file    Lack of Transportation (Non-Medical): Patient declined  Physical Activity: Not on file  Stress: Not on file  Social Connections: Unknown (11/27/2023)   Social Connection and Isolation Panel    Frequency of Communication with Friends and Family: Patient unable to answer    Frequency of Social Gatherings with Friends and Family: Patient unable to answer    Attends Religious Services: Patient unable to answer    Active Member of Clubs or Organizations: No    Attends Banker Meetings: Never    Marital Status: Patient unable to answer  Intimate Partner Violence: Not At Risk (11/27/2023)   Epic    Fear of Current or Ex-Partner: No    Emotionally Abused: No    Physically Abused: No    Sexually Abused: No  Depression (PHQ2-9): Not on file  Alcohol Screen: Not on file  Housing: Low Risk (11/27/2023)   Epic    Unable to Pay for Housing in the Last Year: No    Number of Times Moved in the Last Year: 0    Homeless in the Last Year: No  Utilities: Not At Risk (11/27/2023)  Epic    Threatened with loss of utilities: No  Health Literacy: Not on file    Family History:   Family History  Problem Relation Age of Onset   Arthritis Mother        Late 90s   Heart attack Father        Deceased, 66   Heart disease Brother    Hypertension Other    Hyperlipidemia Other    Diabetes type II Sister    Diabetes Brother      ROS:  Please see the history of present illness.  All other ROS reviewed and negative.     Physical Exam/Data: Vitals:   05/21/24 0851 05/21/24 0852 05/21/24 1120 05/21/24 1200   BP:   127/85 117/84  Pulse:   (!) 149 77  Resp:   (!) 22 16  Temp:    98.9 F (37.2 C)  TempSrc:    Oral  SpO2: 99% 99% 100% 100%  Weight:      Height:        Intake/Output Summary (Last 24 hours) at 05/21/2024 1326 Last data filed at 05/21/2024 0100 Gross per 24 hour  Intake --  Output 1600 ml  Net -1600 ml      05/21/2024    4:30 AM 05/20/2024    5:00 AM 05/19/2024    4:38 AM  Last 3 Weights  Weight (lbs) 84 lb 3.5 oz 89 lb 11.6 oz 91 lb 4.3 oz  Weight (kg) 38.2 kg 40.7 kg 41.4 kg     Body mass index is 16.45 kg/m.  General:  Thin, elderly female. Sitting upright in the bed. Asleep. No acute distress  HEENT: normal Neck: no JVD Vascular: Radial pulses 2+ bilaterally Cardiac:  normal S1, S2; RRR; no murmur   Lungs:  coarse breath sounds, normal WOB on Ronda  Abd: soft, nontender Ext: Trace edema in BLE  Musculoskeletal:  No deformities  Skin: warm and dry  Neuro:  CNs 2-12 intact, no focal abnormalities noted Psych:  Normal affect   EKG:  The EKG was personally reviewed and demonstrates:  EKG this AM showed SVT with HR 158 BPM  Telemetry:  Telemetry was personally reviewed and demonstrates:  NSR with episodes of atrial tachycardia, SVT   Relevant CV Studies: Cardiac Studies & Procedures   ______________________________________________________________________________________________ CARDIAC CATHETERIZATION  CARDIAC CATHETERIZATION 09/18/2019  Conclusion  LV end diastolic pressure is moderately elevated.  Left and Right Heart Catheterization 09/18/19: LVEDP moderately elevated, 20 mmHg.  LV gram not performed to conserve contrast. Normal coronary arteries right dominant circulation.  Small coronary arteries with brisk flow without evidence of atherosclerosis. Normal right heart catheterization.  RA 6/5, mean 4; RV 22/-1, EDP 2; PA 23/1, mean 13, PA saturation 68%.;  PW 8/10, mean 8 mmHg.  Aortic saturation 1/2%. Cardiac output 3.57, cardiac index 2.59, QP/QS 1.0,  normal.  Impression: Dyspnea on exertion is related to diastolic dysfunction with elevated LVEDP.  We will start the patient on gentle diuretics.  She would benefit from being on ACE inhibitor's however has underlying hyperkalemia.  We will try to control her blood pressure aggressively, amlodipine  will be another choice.  She will be discharged home with outpatient follow-up, 20 mL contrast utilized.  Findings Coronary Findings Diagnostic  Dominance: Right  Left Anterior Descending Vessel is small. Vessel is angiographically normal.  Left Circumflex Vessel is small. Vessel is angiographically normal.  Right Coronary Artery Vessel is small. Vessel is angiographically normal.  Intervention  No interventions have  been documented.   STRESS TESTS  PCV MYOCARDIAL PERFUSION WITH LEXISCAN  08/07/2018  Narrative Lexiscan  myoview  stress test 08/07/2018: 1. Lexiscan  stress test was performed. Exercise capacity was not assessed. Stress symptoms included dizziness and headache. Resting blood pressure was 112/60 mmHg and peak effect blood pressure was 120/62 mmHg. The resting and stress electrocardiogram demonstrated normal sinus rhythm, normal conduction, no arrhythmias and normal repolarization. Stress EKG is non diagnostic for ischemia as it is a pharmacologic stress. 2. The overall quality of the study is good. There is no evidence of abnormal lung activity. Stress and rest SPECT images demonstrate homogeneous tracer distribution throughout the myocardium. Gated SPECT imaging reveals normal myocardial thickening and wall motion. The left ventricular ejection fraction was normal (62%). 3. Low risk study.   ECHOCARDIOGRAM  ECHOCARDIOGRAM COMPLETE 05/18/2024  Narrative ECHOCARDIOGRAM REPORT    Patient Name:   Hannah [name] Date of Exam: 05/18/2024 Medical Rec #:  [id number]       Height:       60.0 in Accession #:    [id number]      Weight:       91.5 lb Date of Birth:  [birth year]        BSA:          1.339 m Patient Age:    89 years        BP:           152/93 mmHg Patient Gender: F               HR:           144 bpm. Exam Location:  Inpatient  Procedure: 2D Echo, Cardiac Doppler and Color Doppler (Both Spectral and Color Flow Doppler were utilized during procedure).  Indications:    CHF- Acute Systolic  History:        Patient has prior history of Echocardiogram examinations, most recent 05/04/2024. Risk Factors:Hypertension and Diabetes.  Sonographer:    Sherlean Dubin Referring Phys: [id number] SUBRINA SUNDIL   Sonographer Comments: Image acquisition challenging due to respiratory motion. IMPRESSIONS   1. Left ventricular ejection fraction, by estimation, is 60 to 65%. The left ventricle has normal function. The left ventricle has no regional wall motion abnormalities. Left ventricular diastolic parameters are consistent with Grade I diastolic dysfunction (impaired relaxation). 2. Right ventricular systolic function is normal. The right ventricular size is normal. 3. The mitral valve is normal in structure. Mild mitral valve regurgitation. No evidence of mitral stenosis. 4. The aortic valve is tricuspid. Aortic valve regurgitation is not visualized. No aortic stenosis is present. 5. The inferior vena cava is normal in size with greater than 50% respiratory variability, suggesting right atrial pressure of 3 mmHg.  FINDINGS Left Ventricle: Left ventricular ejection fraction, by estimation, is 60 to 65%. The left ventricle has normal function. The left ventricle has no regional wall motion abnormalities. The left ventricular internal cavity size was normal in size. There is no left ventricular hypertrophy. Left ventricular diastolic parameters are consistent with Grade I diastolic dysfunction (impaired relaxation).  Right Ventricle: The right ventricular size is normal. No increase in right ventricular wall thickness. Right ventricular systolic function is  normal.  Left Atrium: Left atrial size was normal in size.  Right Atrium: Right atrial size was normal in size.  Pericardium: There is no evidence of pericardial effusion.  Mitral Valve: The mitral valve is normal in structure. Mild mitral valve regurgitation. No evidence of mitral valve stenosis.  Tricuspid Valve: The tricuspid  valve is normal in structure. Tricuspid valve regurgitation is not demonstrated. No evidence of tricuspid stenosis.  Aortic Valve: The aortic valve is tricuspid. Aortic valve regurgitation is not visualized. No aortic stenosis is present.  Pulmonic Valve: The pulmonic valve was normal in structure. Pulmonic valve regurgitation is not visualized. No evidence of pulmonic stenosis.  Aorta: The aortic root is normal in size and structure.  Venous: The inferior vena cava is normal in size with greater than 50% respiratory variability, suggesting right atrial pressure of 3 mmHg.  IAS/Shunts: No atrial level shunt detected by color flow Doppler.   LEFT VENTRICLE PLAX 2D LVIDd:         3.70 cm     Diastology LVIDs:         2.70 cm     LV e' medial:    8.55 cm/s LV PW:         0.90 cm     LV E/e' medial:  7.4 LV IVS:        0.80 cm     LV e' lateral:   5.28 cm/s LVOT diam:     2.00 cm     LV E/e' lateral: 12.0 LV SV:         36 LV SV Index:   27 LVOT Area:     3.14 cm LV IVRT:       137 msec  LV Volumes (MOD) LV vol d, MOD A2C: 40.7 ml LV vol d, MOD A4C: 32.1 ml LV vol s, MOD A2C: 18.3 ml LV vol s, MOD A4C: 11.4 ml LV SV MOD A2C:     22.4 ml LV SV MOD A4C:     32.1 ml LV SV MOD BP:      22.4 ml  RIGHT VENTRICLE             IVC RV Basal diam:  2.70 cm     IVC diam: 0.90 cm RV Mid diam:    2.40 cm RV S prime:     12.60 cm/s TAPSE (M-mode): 1.4 cm  LEFT ATRIUM             Index        RIGHT ATRIUM          Index LA diam:        2.10 cm 1.57 cm/m   RA Area:     6.18 cm LA Vol (A2C):   15.5 ml 11.58 ml/m  RA Volume:   10.80 ml 8.07 ml/m LA Vol  (A4C):   13.0 ml 9.71 ml/m LA Biplane Vol: 15.6 ml 11.65 ml/m AORTIC VALVE LVOT Vmax:   65.90 cm/s LVOT Vmean:  40.100 cm/s LVOT VTI:    0.115 m  AORTA Ao Root diam: 2.80 cm Ao Asc diam:  2.70 cm  MITRAL VALVE               TRICUSPID VALVE MV Area (PHT): 4.80 cm    TR Peak grad:   6.4 mmHg MV Decel Time: 158 msec    TR Vmax:        126.00 cm/s MR Peak grad: 54.3 mmHg MR Vmax:      368.50 cm/s  SHUNTS MV E velocity: 63.10 cm/s  Systemic VTI:  0.12 m MV A velocity: 68.40 cm/s  Systemic Diam: 2.00 cm MV E/A ratio:  0.92  Morene Brownie Electronically signed by Morene Brownie Signature Date/Time: 05/18/2024/4:50:12 PM    Final  ______________________________________________________________________________________________       Laboratory Data: High Sensitivity Troponin:  No results for input(s): TROPONINIHS in the last 720 hours.  Recent Labs  Lab 05/17/24 1739 05/17/24 2118  TRNPT 66* 48*      Chemistry Recent Labs  Lab 05/18/24 1641 05/19/24 0904 05/20/24 0215 05/21/24 0228  NA  --  138 139 139  K  --  3.5 3.6 3.1*  CL  --  99 103 101  CO2  --  24 26 26   GLUCOSE  --  236* 166* 192*  BUN  --  13 15 18   CREATININE  --  1.11* 0.98 1.04*  CALCIUM   --  8.6* 8.1* 8.2*  MG 1.6*  --   --  1.8  GFRNONAA  --  47* 55* 51*  ANIONGAP  --  14 10 12     Recent Labs  Lab 05/18/24 0315 05/21/24 0228  PROT 5.5*  --   ALBUMIN 3.2* 3.2*  AST 47*  --   ALT 31  --   ALKPHOS 49  --   BILITOT <0.2  --    Lipids No results for input(s): CHOL, TRIG, HDL, LABVLDL, LDLCALC, CHOLHDL in the last 168 hours.  Hematology Recent Labs  Lab 05/19/24 0904 05/20/24 0215 05/21/24 0228  WBC 10.3 16.9* 16.0*  RBC 4.06 3.49* 3.52*  HGB 12.0 10.3* 10.2*  HCT 36.5 31.0* 30.9*  MCV 89.9 88.8 87.8  MCH 29.6 29.5 29.0  MCHC 32.9 33.2 33.0  RDW 17.2* 17.2* 16.9*  PLT 260 265 290   Thyroid  No results for input(s): TSH, FREET4 in the last 168 hours.   BNP Recent Labs  Lab 05/17/24 1739 05/18/24 1641 05/20/24 0215  PROBNP 453.0* 644.0* 1,916.0*    DDimer No results for input(s): DDIMER in the last 168 hours.  Radiology/Studies:  DG CHEST PORT 1 VIEW Result Date: 05/20/2024 EXAM: 1 VIEW(S) XRAY OF THE CHEST 05/20/2024 12:14:01 PM COMPARISON: 05/17/2024 CLINICAL HISTORY: SOB (shortness of breath) FINDINGS: LUNGS AND PLEURA: Chronic coarsened interstitial markings without pulmonary edema. Bronchial wall thickening and reticulonodular opacities in the lower lungs. No pleural effusion. No pneumothorax. HEART AND MEDIASTINUM: Atherosclerotic plaque noted. No acute abnormality of the cardiac and mediastinal silhouettes. BONES AND SOFT TISSUES: No acute osseous abnormality. IMPRESSION: 1. Findings compatible with small airway infection/inflammation similar to CT 1 / 1 / 26 . Electronically signed by: Norman Gatlin MD 05/20/2024 12:37 PM EST RP Workstation: [id number]   ECHOCARDIOGRAM COMPLETE Result Date: 05/18/2024    ECHOCARDIOGRAM REPORT   Patient Name:   Hannah [name] Date of Exam: 05/18/2024 Medical Rec #:  [id number]       Height:       60.0 in Accession #:    [id number]      Weight:       91.5 lb Date of Birth:  04-20-1933       BSA:          1.339 m Patient Age:    89 years        BP:           152/93 mmHg Patient Gender: F               HR:           144 bpm. Exam Location:  Inpatient Procedure: 2D Echo, Cardiac Doppler and Color Doppler (Both Spectral and Color            Flow Doppler were utilized during procedure). Indications:  CHF- Acute Systolic  History:        Patient has prior history of Echocardiogram examinations, most                 recent 05/04/2024. Risk Factors:Hypertension and Diabetes.  Sonographer:    Sherlean Dubin Referring Phys: [id number] SUBRINA SUNDIL  Sonographer Comments: Image acquisition challenging due to respiratory motion. IMPRESSIONS  1. Left ventricular ejection fraction, by estimation, is 60 to 65%. The left  ventricle has normal function. The left ventricle has no regional wall motion abnormalities. Left ventricular diastolic parameters are consistent with Grade I diastolic dysfunction (impaired relaxation).  2. Right ventricular systolic function is normal. The right ventricular size is normal.  3. The mitral valve is normal in structure. Mild mitral valve regurgitation. No evidence of mitral stenosis.  4. The aortic valve is tricuspid. Aortic valve regurgitation is not visualized. No aortic stenosis is present.  5. The inferior vena cava is normal in size with greater than 50% respiratory variability, suggesting right atrial pressure of 3 mmHg. FINDINGS  Left Ventricle: Left ventricular ejection fraction, by estimation, is 60 to 65%. The left ventricle has normal function. The left ventricle has no regional wall motion abnormalities. The left ventricular internal cavity size was normal in size. There is  no left ventricular hypertrophy. Left ventricular diastolic parameters are consistent with Grade I diastolic dysfunction (impaired relaxation). Right Ventricle: The right ventricular size is normal. No increase in right ventricular wall thickness. Right ventricular systolic function is normal. Left Atrium: Left atrial size was normal in size. Right Atrium: Right atrial size was normal in size. Pericardium: There is no evidence of pericardial effusion. Mitral Valve: The mitral valve is normal in structure. Mild mitral valve regurgitation. No evidence of mitral valve stenosis. Tricuspid Valve: The tricuspid valve is normal in structure. Tricuspid valve regurgitation is not demonstrated. No evidence of tricuspid stenosis. Aortic Valve: The aortic valve is tricuspid. Aortic valve regurgitation is not visualized. No aortic stenosis is present. Pulmonic Valve: The pulmonic valve was normal in structure. Pulmonic valve regurgitation is not visualized. No evidence of pulmonic stenosis. Aorta: The aortic root is normal in size  and structure. Venous: The inferior vena cava is normal in size with greater than 50% respiratory variability, suggesting right atrial pressure of 3 mmHg. IAS/Shunts: No atrial level shunt detected by color flow Doppler.  LEFT VENTRICLE PLAX 2D LVIDd:         3.70 cm     Diastology LVIDs:         2.70 cm     LV e' medial:    8.55 cm/s LV PW:         0.90 cm     LV E/e' medial:  7.4 LV IVS:        0.80 cm     LV e' lateral:   5.28 cm/s LVOT diam:     2.00 cm     LV E/e' lateral: 12.0 LV SV:         36 LV SV Index:   27 LVOT Area:     3.14 cm LV IVRT:       137 msec  LV Volumes (MOD) LV vol d, MOD A2C: 40.7 ml LV vol d, MOD A4C: 32.1 ml LV vol s, MOD A2C: 18.3 ml LV vol s, MOD A4C: 11.4 ml LV SV MOD A2C:     22.4 ml LV SV MOD A4C:     32.1 ml LV SV MOD BP:  22.4 ml RIGHT VENTRICLE             IVC RV Basal diam:  2.70 cm     IVC diam: 0.90 cm RV Mid diam:    2.40 cm RV S prime:     12.60 cm/s TAPSE (M-mode): 1.4 cm LEFT ATRIUM             Index        RIGHT ATRIUM          Index LA diam:        2.10 cm 1.57 cm/m   RA Area:     6.18 cm LA Vol (A2C):   15.5 ml 11.58 ml/m  RA Volume:   10.80 ml 8.07 ml/m LA Vol (A4C):   13.0 ml 9.71 ml/m LA Biplane Vol: 15.6 ml 11.65 ml/m  AORTIC VALVE LVOT Vmax:   65.90 cm/s LVOT Vmean:  40.100 cm/s LVOT VTI:    0.115 m  AORTA Ao Root diam: 2.80 cm Ao Asc diam:  2.70 cm MITRAL VALVE               TRICUSPID VALVE MV Area (PHT): 4.80 cm    TR Peak grad:   6.4 mmHg MV Decel Time: 158 msec    TR Vmax:        126.00 cm/s MR Peak grad: 54.3 mmHg MR Vmax:      368.50 cm/s  SHUNTS MV E velocity: 63.10 cm/s  Systemic VTI:  0.12 m MV A velocity: 68.40 cm/s  Systemic Diam: 2.00 cm MV E/A ratio:  0.92 Morene Brownie Electronically signed by Morene Brownie Signature Date/Time: 05/18/2024/4:50:12 PM    Final    CT Chest Wo Contrast Result Date: 05/17/2024 CLINICAL DATA:  Shortness of breath and cough. EXAM: CT CHEST WITHOUT CONTRAST TECHNIQUE: Multidetector CT imaging of the chest was  performed following the standard protocol without IV contrast. RADIATION DOSE REDUCTION: This exam was performed according to the departmental dose-optimization program which includes automated exposure control, adjustment of the mA and/or kV according to patient size and/or use of iterative reconstruction technique. COMPARISON:  Chest CT dated 04/09/2024. FINDINGS: Evaluation of this exam is limited in the absence of intravenous contrast. Cardiovascular: There is no cardiomegaly. Trace pericardial effusion. There is mild atherosclerotic calcification of the thoracic aorta. No aneurysmal dilatation. The central pulmonary arteries are grossly unremarkable. Mediastinum/Nodes: No hilar or mediastinal adenopathy. The esophagus is grossly unremarkable. No mediastinal fluid collection. Lungs/Pleura: Linear scarring in the right middle lobe appear scattered clusters of ground-glass nodular densities with tree-in-bud appearance primarily in the lingula and to a lesser degree in the left lower lobe may be residual from prior infectious process or represent recurrent atypical infection. No consolidative changes. There is no pleural effusion or pneumothorax. Right apical scarring. 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 The central airways are patent. Upper Abdomen: Gallstones. Musculoskeletal: Osteopenia.  No acute osseous pathology. IMPRESSION: 1. Scattered clusters of ground-glass nodular densities with tree-in-bud appearance primarily in the lingula and to a lesser degree in the left lower lobe may be residual from prior infectious process or represent recurrent atypical infection. 2. Cholelithiasis. 3.  Aortic Atherosclerosis (ICD10-I70.0). Electronically Signed   By: Vanetta Chou M.D.   On: 05/17/2024 18:35   DG Chest Port 1 View Result Date: 05/17/2024 CLINICAL DATA:  Chest pain. EXAM: PORTABLE CHEST 1 VIEW COMPARISON:  04/09/2024 radiographs and CT FINDINGS: The heart is normal in size. Mediastinal contours are stable with  aortic atherosclerosis. Chronic hyperinflation and bronchial  thickening. Improvement in bibasilar aeration from prior. No progressive airspace disease. Right costophrenic angle not included in the field of view. No pneumothorax or large pleural effusion. On limited assessment, no acute osseous findings. IMPRESSION: Chronic hyperinflation and bronchial thickening. Improvement in bibasilar aeration from prior. No progressive airspace disease. Electronically Signed   By: Andrea Gasman M.D.   On: 05/17/2024 17:53     Assessment and Plan:  Chronic Diastolic Heart Failure  Mild MR  - Echocardiogram this admission showed EF 60-65%, no wall motion abnormalities, normal RV systolic function, mild MR  - Patient was given IV fluids in the ED due to concern for sepsis. The next morning, family tells me that patient had significant lower extremity edema, facial edema, and abdominal distention. Pro BNP increased up to 1916 yesterday  - Given one dose of IV lasix  40 mg yesterday. Put out 1.6 L urine. Weight down trending  - She continues to have abdominal distention and trace edema. Ordered one dose of IV lasix  20 mg today. Follow I/Os, daily weights, and BMP in AM  - K was 3.1 this AM. Patient receiving K supplementation per primary   Atrial Tachycardia  - Per telemetry, patient predominantly in NSR with episodes of SVT/atrial tachycardia  - K 3.1 this AM- supplementation ordered per primary  - Continue metoprolol  tartrate 25 mg BID   Otherwise per primary  - Influenza A with pneumonia  - Possible UTI  - Possible Sepsis- of note, 1 of 2 blood cultures grew MSSA. Repeat cultures have been negative. Per ID, if repeat blood cultures remain negative, would not recommend TEE - Diarrhea  Risk Assessment/Risk Scores:  For questions or updates, please contact [facility] Please consult [email or web address] for contact info under   Signed, Rollo FABIENE Louder, PA-C  05/21/2024 1:26 PM  Patient seen and  examined, note reviewed with the signed Advanced Practice Provider. I personally reviewed laboratory data, imaging studies and relevant notes. I independently examined the patient and formulated the important aspects of the plan. I have personally discussed the plan with the patient and/or family. Comments or changes to the note/plan are indicated below.  Patient seen and examined at her bedside. She was sleeping when I arrived. Her daughter was at the bedside.   Chronic diastolic heart failure Mild MR  Atrial tachycardia Sepsis   Clinically she does not appear to be volume overloaded. I agree with the one time dose of Lasix  . We will reassess clinically for need for duiretics daily.  Continue current dose of AV nodal blockers Please replete her electrolytes. Keep K>4 and Mag > 2 Abx per primary team  We will continue to follow with you   Robinette Esters DO, MS [facility] Attending Cardiologist [facility]  [address] [place], [place] [address] [phone] Website: [email or web address]      [1]  Allergies Allergen Reactions   Penicillins Rash and Other (See Comments)    1980's   Sulfa Antibiotics Rash   "

## 2024-05-21 NOTE — Consult Note (Signed)
 " Palliative Medicine Inpatient Consult Note  Consulting Provider: Rosario Leatrice FERNS, MD   Reason for consult:   Palliative Care Consult Services Palliative Medicine Consult  Reason for Consult? Code status and goal of care   05/21/2024  HPI:  Per intake H&P --> 89 year old female with documented past medical history significant for chronic heart failure with preserved ejection fraction, hypertension, COPD, hypothyroidism, chronic kidney disease stage III and type 2 diabetes mellitus. Patient presented with worsening shortness of breath and edema, identified to have influenza A and diastolic heart failure. Palliative care asked to support additional goals of care conversations.   Clinical Assessment/Goals of Care:  *Please note that this is a verbal dictation therefore any spelling or grammatical errors are due to the Dragon Medical One system interpretation.  I have reviewed medical records including EPIC notes, labs and imaging, received report from bedside RN, assessed the patient who is lying in bed in no acute distress opens eyes intermittently and does smile and respond appropriately.    I met with patient's daughter, Josetta to further discuss diagnosis prognosis, GOC, EOL wishes, disposition and options.   I introduced Palliative Medicine as specialized medical care for people living with serious illness. It focuses on providing relief from the symptoms and stress of a serious illness. The goal is to improve quality of life for both the patient and the family.  Medical History Review and Understanding:  A review of Zeeva's past medical history significant for COPD, hypertension, hypothyroidism, CKD stage III, type 2 diabetes, & diastolic heart failure was completed.  Social History:  Teagyn is [place].  She presently lives in [place], [place] .  Blakley has 4 children, multiple grandchildren, and 2 great-grandchildren.  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  She gets enjoyment out of maintaining independence and in time with her family.  Functional and Nutritional State:  Preceding hospitalization, Kayde was able to mobilize with a rollator.  She was able to dress herself, feed herself, provide her own medications.  Her family shares that they did provide her meals.  She was eating well preceding flu symptoms.  Advance Directives:  A detailed discussion was had today regarding advanced directives.  Patients four children are her surrogate decision makers.  Code Status:  Concepts specific to code status, artifical feeding and hydration, continued IV antibiotics and rehospitalization was had.  The difference between a aggressive medical intervention path  and a palliative comfort care path for this patient at this time was had.   Patient has previously had a DO NOT RESUSCITATE though her daughter shares a lack of appreciation and how the conversation was approached therefore at this point in time she would like for mom to remain full code full scope of care.  Discussion:  We discussed the circumstances preceding hospitalization and the profound weakness that Koreena was experiencing leading her daughters to take her to [facility] for further workup.  Josetta shares that she was then made aware that her mother had a urinary infection, pneumonia, influenza A.  We discussed the significance of influenza A and how it can cause alteration in patient's level of function and nutritional intake.  We reviewed that at this point in time patient's family would like for all treatments to be pursued to see if Anona's clinical condition can be improved upon.   Josetta shares worry over her mother's heart rate, troponin level, and volume overload.  She and I reviewed cardiology is a consideration to provide insights.  Patient had previously been seeing Dr. Ladona with whom they have a close  relationship.  I shared that I would pass this along to see if the cardiology team would be willing to see Cienna during hospitalization.  We reviewed that Arilyn has had more difficulty swallowing medications in the setting of newly identified oral thrush.  We reviewed the plan to treat that with medication.  I shared that I would reach out to the pharmacist given Donta's current kidney function to better determine what a safe dose would be.  We reviewed the profound weakness that Ayasha is experiencing in the setting of her acute illness.  We discussed the importance of ongoing therapy to help optimize her strength.  We discussed delirium and how this may alter Juniper's level of cognition in the setting of her acute hospitalization, unfamiliar environment, unfamiliar faces, significant illness, and altered sleep-wake cycle.  We reviewed ways to best improve upon delirium during hospitalization.  We reviewed the hopes of patient's family moving forward and taking Taiylor's clinical status 1 day at a time.  Discussed the importance of continued conversation with family and their  medical providers regarding overall plan of care and treatment options, ensuring decisions are within the context of the patients values and GOCs.  Decision Maker: Bruyere,Jay: Daughter, Emergency Contact: [phone] (Mobile)   SUMMARY OF RECOMMENDATIONS   Full code/full scope of care  Allowing time for outcomes  Appreciate the insights of the cardiology team regarding patient's fluctuating heart rate   Appreciate PT and OT to work with patient on strengthening exercises  Delirium precautions  Ongoing palliative care support  Code Status/Advance Care Planning: FULL CODE  Palliative Prophylaxis:  Aspiration, Bowel Regimen, Delirium Protocol, Frequent Pain Assessment, Oral Care, Palliative Wound Care, and Turn Reposition  Additional Recommendations (Limitations, Scope, Preferences): Continue current  care  Psycho-social/Spiritual:  Desire for further Chaplaincy support: Not at this time Additional Recommendations: Discussion of acute hospitalization, chronic disease, delirium   Prognosis: Patient was well-functioning preceding hospitalization however this hospitalization patient's family understand her general frailty.  77-month mortality risk is elevated in the setting of prolonged hospitalization.  Discharge Planning: Discharge plan to be determined.  Vitals:   05/21/24 0851 05/21/24 0852  BP:    Pulse:    Resp:    Temp:    SpO2: 99% 99%    Intake/Output Summary (Last 24 hours) at 05/21/2024 0925 Last data filed at 05/21/2024 0100 Gross per 24 hour  Intake --  Output 1600 ml  Net -1600 ml   Last Weight  Most recent update: 05/21/2024  4:32 AM    Weight  38.2 kg (84 lb 3.5 oz)            LABS: CBC:    Component Value Date/Time   WBC 16.0 (H) 05/21/2024 0228   HGB 10.2 (L) 05/21/2024 0228   HGB 11.8 09/14/2019 0837   HCT 30.9 (L) 05/21/2024 0228   HCT 37.4 09/14/2019 0837   PLT 290 05/21/2024 0228   PLT 267 09/14/2019 0837   MCV 87.8 05/21/2024 0228   MCV 88 09/14/2019 0837   NEUTROABS 12.3 (H) 05/21/2024 0228   LYMPHSABS 1.2 05/21/2024 0228   MONOABS 1.4 (H) 05/21/2024 0228   EOSABS 0.0 05/21/2024 0228   BASOSABS 0.1 05/21/2024 0228   Comprehensive Metabolic Panel:    Component Value Date/Time   NA 139 05/21/2024 0228   NA 140 03/02/2024 0817  K 3.1 (L) 05/21/2024 0228   CL 101 05/21/2024 0228   CO2 26 05/21/2024 0228   BUN 18 05/21/2024 0228   BUN 14 03/02/2024 0817   CREATININE 1.04 (H) 05/21/2024 0228   GLUCOSE 192 (H) 05/21/2024 0228   CALCIUM  8.2 (L) 05/21/2024 0228   AST 47 (H) 05/18/2024 0315   ALT 31 05/18/2024 0315   ALKPHOS 49 05/18/2024 0315   BILITOT <0.2 05/18/2024 0315   BILITOT 0.2 11/15/2018 0945   PROT 5.5 (L) 05/18/2024 0315   PROT 7.0 11/15/2018 0945   ALBUMIN 3.2 (L) 05/21/2024 0228   ALBUMIN 4.5 11/15/2018 0945   Gen:  Elderly Indian female HEENT: Oral thrush, dry mucous membranes CV: Regular rate and rhythm PULM: On 2 L nasal cannula breathing is even and nonlabored ABD: soft/nontender  EXT:   (+) pedal edema Neuro: Alert and oriented -language barrier aware of self and place  PPS: 30-40%   This conversation/these recommendations were discussed with patient primary care team, Dr. Rosario ______________________________________________________ Rosaline [facility] Palliative Medicine Team Team Cell Phone: [phone] Please utilize secure chat with additional questions, if there is no response within 30 minutes please call the above phone number  Total Time: 75 Billing based on MDM: High  Palliative Medicine Team providers are available by phone from 7am to 7pm daily and can be reached through the team cell phone.  Should this patient require assistance outside of these hours, please call the patient's attending physician.  "

## 2024-05-21 NOTE — TOC Progression Note (Addendum)
 Transition of Care [facility]) - Progression Note    Patient Details  Name: Hannah [name] MRN: [id number] Date of Birth: 1933-01-15  Transition [facility]) CM/SW Contact  Luise JAYSON Pan, [place] Phone Number: 05/21/2024, 10:51 AM  Clinical Narrative:   CSW followed up with Gordy about plans for discharge. Gordy stated that SNF is not something that patient/family want at this time due to patient having to stay at a facility for short term rehab. Gordy stated that family would like to look more into daycare centers for adults (previous CSW provided [facility] information). Gordy recognizes that patient could benefit from therapy but wants to adhere to her moms wishes and religious practices as well. Gordy stated patient received home health services in the past but that they were not as effective. Gordy stated she will follow up with CSW regarding plans for discharge.                       Expected Discharge Plan and Services                                               Social Drivers of Health (SDOH) Interventions SDOH Screenings   Food Insecurity: Patient Declined (05/18/2024)  Housing: Low Risk (11/27/2023)  Transportation Needs: Unknown (05/18/2024)  Utilities: Not At Risk (11/27/2023)  Social Connections: Unknown (11/27/2023)  Tobacco Use: Low Risk (05/17/2024)    Readmission Risk Interventions     No data to display

## 2024-05-21 NOTE — Progress Notes (Signed)
" °   05/21/24 1200  Vitals  Temp 98.9 F (37.2 C)  Temp Source Oral  BP 117/84  MAP (mmHg) 91  BP Location Right Arm  BP Method Automatic  Patient Position (if appropriate) Lying  Pulse Rate 77  Pulse Rate Source Monitor  ECG Heart Rate 91  Resp 16  MEWS COLOR  MEWS Score Color Green  Oxygen Therapy  SpO2 100 %  O2 Device Nasal Cannula  O2 Flow Rate (L/min) 2 L/min  Pain Assessment  Pain Scale 0-10  Pain Score 0  MEWS Score  MEWS Temp 0  MEWS Systolic 0  MEWS Pulse 0  MEWS RR 0  MEWS LOC 0  MEWS Score 0   Rechecked after Chills and working with therapy complete as well  "

## 2024-05-21 NOTE — Evaluation (Signed)
 Occupational Therapy Evaluation Patient Details Name: Hannah [name] MRN: [id number] DOB: 05/25/1932 Today's Date: 05/21/2024   History of Present Illness   Pt is an 89 y.o. F presenting to [facility] on 05/17/24 with SOB, cough, fatigue, BLE swelling, and generalized weakness. PMH is significant for CHF, HTN, COPD, hypothyroidism, DM T2, and CKD.     Clinical Impressions Prior to this admission, patient living with family but able to manage basic needs with family checking on her intermittently throughout the day. Patient using a rollator and requiring assist with her IADLs. Currently, patient on 2L, with fatigue and weakness throughout session. Patient with HR as high as 160 with minimal movement to EOB and inconsistent pleth with O2 on RA and on 2L O2. Patient min A to come into standing, with immediate incontinence requiring full peri-care. Patient electing to return to bed instead of transition to recliner. Family declining rehab facility, with OT recommending HHOT at discharge. OT will continue to follow acutely.  CCMD contacted prior to session     If plan is discharge home, recommend the following:   A lot of help with walking and/or transfers;A lot of help with bathing/dressing/bathroom;Assistance with cooking/housework;Direct supervision/assist for medications management;Direct supervision/assist for financial management;Assist for transportation;Help with stairs or ramp for entrance;Supervision due to cognitive status     Functional Status Assessment   Patient has had a recent decline in their functional status and demonstrates the ability to make significant improvements in function in a reasonable and predictable amount of time.     Equipment Recommendations   None recommended by OT     Recommendations for Other Services         Precautions/Restrictions   Precautions Precautions: Fall Recall of Precautions/Restrictions: Intact Restrictions Weight Bearing  Restrictions Per Provider Order: No     Mobility Bed Mobility Overal bed mobility: Needs Assistance Bed Mobility: Supine to Sit, Sit to Supine     Supine to sit: Contact guard Sit to supine: Contact guard assist   General bed mobility comments: CGA for safety, increased time required    Transfers Overall transfer level: Needs assistance Equipment used: Rolling walker (2 wheels) Transfers: Sit to/from Stand Sit to Stand: Min assist           General transfer comment: Min A to come into standing, immediate incontinence episode requiring increased peri-care assist      Balance Overall balance assessment: Needs assistance Sitting-balance support: Bilateral upper extremity supported, Feet supported Sitting balance-Leahy Scale: Fair     Standing balance support: Bilateral upper extremity supported, During functional activity, Reliant on assistive device for balance Standing balance-Leahy Scale: Poor Standing balance comment: reliant on external support                           ADL either performed or assessed with clinical judgement   ADL Overall ADL's : Needs assistance/impaired Eating/Feeding: Set up;Sitting   Grooming: Set up;Sitting   Upper Body Bathing: Minimal assistance;Sitting   Lower Body Bathing: Maximal assistance;Sit to/from stand;Sitting/lateral leans   Upper Body Dressing : Minimal assistance;Sitting   Lower Body Dressing: Maximal assistance;Sit to/from stand;Sitting/lateral leans   Toilet Transfer: Minimal assistance;Rolling walker (2 wheels);BSC/3in1 Toilet Transfer Details (indicate cue type and reason): simulated Toileting- Clothing Manipulation and Hygiene: Total assistance;Sit to/from stand;Sitting/lateral lean Toileting - Clothing Manipulation Details (indicate cue type and reason): incontinent at baseline     Functional mobility during ADLs: Cueing for sequencing;Cueing for safety;Moderate assistance General ADL  Comments: Prior  to this admission, patient living with family but able to manage basic needs with family checking on her intermittently throughout the day. Patient using a rollator and requiring assist with her IADLs. Currently, patient on 2L, with fatigue and weakness throughout session. Patient with HR as high as 160 with minimal movement to EOB and inconsistent pleth with O2 on RA and on 2L O2. Patient min A to come into standing, with immediate incontinence requiring full peri-care. Patient electing to return to bed instead of transition to recliner. Family declining rehab facility, with OT recommending HHOT at discharge. OT will continue to follow acutely.     Vision Baseline Vision/History: 0 No visual deficits Ability to See in Adequate Light: 0 Adequate Patient Visual Report: No change from baseline Vision Assessment?: No apparent visual deficits     Perception Perception: Not tested       Praxis Praxis: Not tested       Pertinent Vitals/Pain Pain Assessment Pain Assessment: Faces Pain Score: 0-No pain Pain Intervention(s): Limited activity within patient's tolerance, Monitored during session, Repositioned     Extremity/Trunk Assessment Upper Extremity Assessment Upper Extremity Assessment: Generalized weakness;Right hand dominant   Lower Extremity Assessment Lower Extremity Assessment: Defer to PT evaluation   Cervical / Trunk Assessment Cervical / Trunk Assessment: Kyphotic   Communication Communication Communication: No apparent difficulties   Cognition Arousal: Alert Behavior During Therapy: Flat affect Cognition: Cognition impaired, Difficult to assess Difficult to assess due to: Non-English speaking Orientation impairments: Time, Situation Awareness: Intellectual awareness intact, Online awareness impaired Memory impairment (select all impairments): Short-term memory Attention impairment (select first level of impairment): Focused attention Executive functioning impairment  (select all impairments): Initiation, Organization, Sequencing, Reasoning, Problem solving OT - Cognition Comments: daughter present for translating, per daughter cognition is improved however not at baseline                 Following commands: Impaired Following commands impaired: Follows one step commands with increased time, Follows one step commands inconsistently     Cueing  General Comments   Cueing Techniques: Verbal cues;Gestural cues  HR as high at 160 BPM, VSS on 2L O2   Exercises     Shoulder Instructions      Home Living Family/patient expects to be discharged to:: Private residence Living Arrangements: Children Available Help at Discharge: Family;Available PRN/intermittently (all kids work, but rotate checking in on her at lunch time) Type of Home: House Home Access: Stairs to enter [employer] of Steps: 5 Entrance Stairs-Rails: None Home Layout: One level     Bathroom Shower/Tub: Producer, Television/film/video: Standard Bathroom Accessibility: No   Home Equipment: Rollator (4 wheels);Shower seat (walking stick)          Prior Functioning/Environment Prior Level of Function : Needs assist             Mobility Comments: per pt's daughter, pt has been mobilizing with rollator in mian areas of the house, but rollator does not fit in bathroom. ADLs Comments: family assists with IADL, PRN assist for bathing/dressing    OT Problem List: Decreased strength;Decreased range of motion;Decreased activity tolerance;Impaired balance (sitting and/or standing);Decreased cognition;Decreased safety awareness;Decreased knowledge of use of DME or AE;Decreased knowledge of precautions;Cardiopulmonary status limiting activity   OT Treatment/Interventions: Self-care/ADL training;Therapeutic exercise;Energy conservation;DME and/or AE instruction;Manual therapy;Therapeutic activities;Patient/family education;Balance training      OT Goals(Current  goals can be found in the care plan section)   Acute Rehab OT Goals  Patient Stated Goal: per family, to get better OT Goal Formulation: With patient Time For Goal Achievement: 06/04/24 Potential to Achieve Goals: Fair ADL Goals Pt Will Perform Lower Body Bathing: with modified independence;sit to/from stand;sitting/lateral leans Pt Will Perform Lower Body Dressing: with modified independence;sitting/lateral leans;sit to/from stand Pt Will Transfer to Toilet: with modified independence;ambulating;regular height toilet Pt Will Perform Toileting - Clothing Manipulation and hygiene: with modified independence;sitting/lateral leans;sit to/from stand Pt/caregiver will Perform Home Exercise Program: Increased ROM;Increased strength;Both right and left upper extremity;With theraband;With written HEP provided;With minimal assist Additional ADL Goal #1: Patient will be able to complete functional task in standing for 3 minutes prior to needing seated rest break to increase overall activity tolerance.   OT Frequency:  Min 2X/week    Co-evaluation              AM-PAC OT 6 Clicks Daily Activity     Outcome Measure Help from another person eating meals?: A Little Help from another person taking care of personal grooming?: A Little Help from another person toileting, which includes using toliet, bedpan, or urinal?: Total Help from another person bathing (including washing, rinsing, drying)?: A Lot Help from another person to put on and taking off regular upper body clothing?: A Little Help from another person to put on and taking off regular lower body clothing?: A Lot 6 Click Score: 14   End of Session Equipment Utilized During Treatment: Rolling walker (2 wheels);Gait belt Nurse Communication: Mobility status  Activity Tolerance: Patient tolerated treatment well Patient left: in bed;with call bell/phone within reach;with bed alarm set;with family/visitor present  OT Visit Diagnosis:  Unsteadiness on feet (R26.81);Other abnormalities of gait and mobility (R26.89);Muscle weakness (generalized) (M62.81);Adult, failure to thrive (R62.7)                Time: 8957-8885 OT Time Calculation (min): 32 min Charges:  OT General Charges $OT Visit: 1 Visit OT Evaluation $OT Eval Moderate Complexity: 1 Mod OT Treatments $Self Care/Home Management : 8-22 mins  Ronal Gift E. Trace Wirick, OTR/L Acute Rehabilitation Services [phone]   Ronal Gift Salt 05/21/2024, 4:01 PM

## 2024-05-21 NOTE — Progress Notes (Signed)
 " PROGRESS NOTE    Hannah [name]  FMW:[id number] DOB: 1933/03/08 DOA: 05/17/2024 PCP: Sun, Vyvyan, MD  Outpatient Specialists:     Brief Narrative:  Patient is an 89 year old female with documented past medical history significant for chronic heart failure with preserved ejection fraction, hypertension, COPD, hypothyroidism, chronic kidney disease stage III and type 2 diabetes mellitus.  Patient presented with worsening shortness of breath and edema.  No fever or chills.  No GI symptoms prior to presentation (according to the patient's daughter).  Workup done so far showed positive influenza A by PCR, 1 out of 2 blood cultures growing Staphylococcus aureus, UA with positive nitrite, proBNP of 644, magnesium  of 1.6, serum creatinine of 1.1-1.69 (baseline serum creatinine of 1.26) and WBC of 10.3-12.7.  Patient is currently being managed for influenza A with pneumonia, bacteremia/sepsis, possible UTI, acute on chronic kidney disease stage IIIa and acute hypoxic respiratory failure.  05/19/2024: Patient seen.  Patient is not a particularly good historian.  Discussed and updated patient's daughter over the phone.  Patient's daughter reports that patient is currently having diarrheal stools.  Infectious disease input is appreciated.  Patient is currently on IV cefazolin .  Echo revealed normal left ventricular ejection fraction and grade 1 diastolic dysfunction.  05/20/2024: Patient seen alongside patient's daughter and nurse.  Patient is otherwise updated extensively.  Also communicated with infectious disease team.  Blood culture done on 05/17/2024 has also grown out by abiotrophia defective (will defer to the infectious disease team).  Blood cultures done on 05/18/2024 and 1-26 have not grown any organisms to date.  Patient continues to have watery stool.  C. difficile toxin was negative by PCR, however, C. difficile antigen was positive.  Poor p.o. intake is reported.  Patient remains full code, as per  documentation.  CODE STATUS broached.  Will consult palliative care team.  Long-term prognosis is guarded.  Echocardiogram only revealed grade 1 diastolic dysfunction.  05/21/2024: Patient seen alongside patient's daughter.  Patient looks much better today.  Low potassium, magnesium  and phosphorus (likely secondary further) noted.  Diarrhea has resolved.  Will discontinue enteric precaution.  Palliative care midlevel provider sent a text message to cardiology and indicating the patient's daughter will want cardiology team to see patient.  Intermittent SVT is noted, without symptoms.  As documented above, patient has abnormal electrolytes that have been repleted.  Cardiology team informed.  Input from infectious disease is highly appreciated.  Assessment & Plan:   Principal Problem:   Sepsis due to pneumonia [facility]) Active Problems:   Chronic obstructive asthma (HCC)   Hypothyroidism   Hypertension   Insulin  dependent type 2 diabetes mellitus (HCC)   CKD stage 3a, GFR 45-59 ml/min (HCC)   Iron  deficiency anemia   Acute hypoxic respiratory failure (HCC)   UTI (urinary tract infection)   Influenza A with pneumonia   Influenza A with possible pneumonia: - Supportive care. - Complete course of Tamiflu . - Infectious disease team has recommended 7-day course of Tamiflu .    Acute hypoxic respiratory failure: - Resolved significantly.  Possible UTI: - Urine culture not visualized. -Patient is already on antibiotics.  Possible bacteremia/documented sepsis due to pneumonia: - 1 of 2 blood cultures grew Staphylococcus aureus.  Repeat blood cultures have been negative. - Blood culture done on 05/17/2024 has just been reported to grow abiotrophia defectiva (Communicated with infectious disease team.  Will await further recommendation by the infectious disease team).  - Repeat blood cultures have not grown any organisms to date. -  Patient is currently on IV Ancef . - Follow repeat culture results. -  Infectious disease team input is greatly appreciated.  Possible acute on chronic diastolic CHF: - Echo revealed normal left ventricular ejection fraction, with grade 1 diastolic dysfunction. - Cautious diuresis.  Patient is currently not in respiratory distress. - Manage expectantly. - Repeat chest x-ray done on 05/20/2024 did not show any new changes.  However, proBNP done on 05/20/2024 was noted to have risen from 644 to 1916.  IV Lasix  40 mg x 1 dose ordered. 05/21/2024: Cardiology team saw patient and recommended IV Lasix  20 mg x 1 dose.  Dementia: - No behavioral problems. - Continue Aricept .  Watery stools versus diarrhea: - Stool PCR was negative for toxigenic C. difficile, however, was positive for C. difficile antigen. - Infectious disease has not recommended any treatment for C. difficile. 05/21/2024: C. difficile colitis ruled out as per infectious disease team.  Discontinue enteric precaution.  Diarrhea has resolved significantly.  Hypokalemia: - Potassium of 3.1. - Replete. - Keep potassium greater than 4.  Hypomagnesemia: - Magnesium  of 1.8 today. - IV magnesium . - Keep magnesium  greater than 2.  Hypophosphatemia: - Phosphorus of 1.4. -Likely secondary to diarrhea. -Poor p.o. intake is also noted. - Neutra-Phos. - Follow renal panel in the morning.  Oral thrush: - Nystatin  S/S. - Patient has history of oral cancer.  Intermittent SVTs: - Not frequent. - Optimize electrolytes. - Keep potassium greater than 4, magnesium  greater than 2 and phosphorus within normal range. - Palliative care team midlevel provider has indicated that patient's family would want patient to be seen by the cardiology team.  Cardiology team notified.  DVT prophylaxis:  Code Status: Full code Family Communication: Updated patient's daughter over the phone Disposition Plan:    Consultants:  Infectious disease Cardiology. Palliative care team.  Procedures:  None  Antimicrobials:  IV  Ancef    Subjective: Patient is a poor historian.  Objective: Vitals:   05/21/24 0430 05/21/24 0800 05/21/24 0851 05/21/24 0852  BP: 126/71 132/63    Pulse: 83 74    Resp: 14 16    Temp: 98.8 F (37.1 C) 98.9 F (37.2 C)    TempSrc: Oral Oral    SpO2: 97% 99% 99% 99%  Weight: 38.2 kg     Height:        Intake/Output Summary (Last 24 hours) at 05/21/2024 1033 Last data filed at 05/21/2024 0100 Gross per 24 hour  Intake --  Output 1600 ml  Net -1600 ml   Filed Weights   05/19/24 0438 05/20/24 0500 05/21/24 0430  Weight: 41.4 kg 40.7 kg 38.2 kg    Examination:  General exam: Appears calm and comfortable  Respiratory system: Minimal expiratory wheeze.   Cardiovascular system: S1 & S2 heard Gastrointestinal system: Abdomen is soft and nontender. Central nervous system: Awake and alert.  Data Reviewed: I have personally reviewed following labs and imaging studies  CBC: Recent Labs  Lab 05/17/24 1721 05/17/24 1728 05/18/24 0231 05/18/24 0315 05/19/24 0904 05/20/24 0215 05/21/24 0228  WBC 12.6*  --   --  12.7* 10.3 16.9* 16.0*  NEUTROABS  --   --   --   --   --   --  12.3*  HGB 12.6   < > 10.5* 10.3* 12.0 10.3* 10.2*  HCT 39.1   < > 31.0* 31.0* 36.5 31.0* 30.9*  MCV 90.1  --   --  89.1 89.9 88.8 87.8  PLT 268  --   --  226 260 265 290   < > = values in this interval not displayed.   Basic Metabolic Panel: Recent Labs  Lab 05/17/24 2118 05/18/24 0231 05/18/24 0315 05/18/24 1641 05/19/24 0904 05/20/24 0215 05/21/24 0228  NA 137 139 140  --  138 139 139  K 4.2 4.0 4.3  --  3.5 3.6 3.1*  CL 99  --  102  --  99 103 101  CO2 21*  --  25  --  24 26 26   GLUCOSE 496*  --  291*  --  236* 166* 192*  BUN 26*  --  22  --  13 15 18   CREATININE 1.46*  --  1.26*  --  1.11* 0.98 1.04*  CALCIUM  8.3*  --  8.8*  --  8.6* 8.1* 8.2*  MG  --   --   --  1.6*  --   --  1.8  PHOS  --   --   --  2.7  --   --  1.4*   GFR: Estimated Creatinine Clearance: 22.1 mL/min (A) (by  C-G formula based on SCr of 1.04 mg/dL (H)). Liver Function Tests: Recent Labs  Lab 05/18/24 0315 05/21/24 0228  AST 47*  --   ALT 31  --   ALKPHOS 49  --   BILITOT <0.2  --   PROT 5.5*  --   ALBUMIN 3.2* 3.2*   No results for input(s): LIPASE, AMYLASE in the last 168 hours. No results for input(s): AMMONIA in the last 168 hours. Coagulation Profile: No results for input(s): INR, PROTIME in the last 168 hours. Cardiac Enzymes: No results for input(s): CKTOTAL, CKMB, CKMBINDEX, TROPONINI in the last 168 hours. BNP (last 3 results) Recent Labs    05/17/24 1739 05/18/24 1641 05/20/24 0215  PROBNP 453.0* 644.0* 1,916.0*   HbA1C: No results for input(s): HGBA1C in the last 72 hours. CBG: Recent Labs  Lab 05/20/24 0606 05/20/24 1227 05/20/24 1726 05/20/24 2112 05/21/24 0556  GLUCAP 177* 288* 362* 364* 163*   Lipid Profile: No results for input(s): CHOL, HDL, LDLCALC, TRIG, CHOLHDL, LDLDIRECT in the last 72 hours. Thyroid  Function Tests: No results for input(s): TSH, T4TOTAL, FREET4, T3FREE, THYROIDAB in the last 72 hours. Anemia Panel: No results for input(s): VITAMINB12, FOLATE, FERRITIN, TIBC, IRON , RETICCTPCT in the last 72 hours. Urine analysis:    Component Value Date/Time   COLORURINE YELLOW 05/17/2024 2054   APPEARANCEUR CLEAR 05/17/2024 2054   LABSPEC 1.015 05/17/2024 2054   PHURINE 7.5 05/17/2024 2054   GLUCOSEU 500 (A) 05/17/2024 2054   HGBUR NEGATIVE 05/17/2024 2054   BILIRUBINUR NEGATIVE 05/17/2024 2054   KETONESUR NEGATIVE 05/17/2024 2054   PROTEINUR TRACE (A) 05/17/2024 2054   UROBILINOGEN 0.2 09/27/2012 1638   NITRITE POSITIVE (A) 05/17/2024 2054   LEUKOCYTESUR TRACE (A) 05/17/2024 2054   Sepsis Labs: @LABRCNTIP (procalcitonin:4,lacticidven:4)  ) Recent Results (from the past 240 hours)  Resp panel by RT-PCR (RSV, Flu A&B, Covid) Anterior Nasal Swab     Status: Abnormal   Collection Time:  05/17/24  5:55 PM   Specimen: Anterior Nasal Swab  Result Value Ref Range Status   SARS Coronavirus 2 by RT PCR NEGATIVE NEGATIVE Final    Comment: (NOTE) SARS-CoV-2 target nucleic acids are NOT DETECTED.  The SARS-CoV-2 RNA is generally detectable in upper respiratory specimens during the acute phase of infection. The lowest concentration of SARS-CoV-2 viral copies this assay can detect is 138 copies/mL. A negative result does not preclude SARS-Cov-2 infection and  should not be used as the sole basis for treatment or other patient management decisions. A negative result may occur with  improper specimen collection/handling, submission of specimen other than nasopharyngeal swab, presence of viral mutation(s) within the areas targeted by this assay, and inadequate number of viral copies(<138 copies/mL). A negative result must be combined with clinical observations, patient history, and epidemiological information. The expected result is Negative.  Fact Sheet for Patients:  bloggercourse.com  Fact Sheet for Healthcare Providers:  seriousbroker.it  This test is no t yet approved or cleared by the [place]  FDA and  has been authorized for detection and/or diagnosis of SARS-CoV-2 by FDA under an Emergency Use Authorization (EUA). This EUA will remain  in effect (meaning this test can be used) for the duration of the COVID-19 declaration under Section 564(b)(1) of the Act, 21 U.S.C.section 360bbb-3(b)(1), unless the authorization is terminated  or revoked sooner.       Influenza A by PCR POSITIVE (A) NEGATIVE Final   Influenza B by PCR NEGATIVE NEGATIVE Final    Comment: (NOTE) The Xpert Xpress SARS-CoV-2/FLU/RSV plus assay is intended as an aid in the diagnosis of influenza from Nasopharyngeal swab specimens and should not be used as a sole basis for treatment. Nasal washings and aspirates are unacceptable for Xpert Xpress  SARS-CoV-2/FLU/RSV testing.  Fact Sheet for Patients: bloggercourse.com  Fact Sheet for Healthcare Providers: seriousbroker.it  This test is not yet approved or cleared by the [place]  FDA and has been authorized for detection and/or diagnosis of SARS-CoV-2 by FDA under an Emergency Use Authorization (EUA). This EUA will remain in effect (meaning this test can be used) for the duration of the COVID-19 declaration under Section 564(b)(1) of the Act, 21 U.S.C. section 360bbb-3(b)(1), unless the authorization is terminated or revoked.     Resp Syncytial Virus by PCR NEGATIVE NEGATIVE Final    Comment: (NOTE) Fact Sheet for Patients: bloggercourse.com  Fact Sheet for Healthcare Providers: seriousbroker.it  This test is not yet approved or cleared by the [place]  FDA and has been authorized for detection and/or diagnosis of SARS-CoV-2 by FDA under an Emergency Use Authorization (EUA). This EUA will remain in effect (meaning this test can be used) for the duration of the COVID-19 declaration under Section 564(b)(1) of the Act, 21 U.S.C. section 360bbb-3(b)(1), unless the authorization is terminated or revoked.  Performed at [facility], [address] [place], [place] [address]   Culture, blood (single)     Status: Abnormal   Collection Time: 05/17/24  7:15 PM   Specimen: BLOOD LEFT FOREARM  Result Value Ref Range Status   Specimen Description   Final    BLOOD LEFT FOREARM Performed at [facility], [address], [place], [place] [address]    Special Requests   Final    BOTTLES DRAWN AEROBIC AND ANAEROBIC Blood Culture adequate volume Performed at [facility], [address], [place], [place] [address]    Culture  Setup Time   Final    GRAM POSITIVE COCCI IN CLUSTERS AEROBIC BOTTLE ONLY CRITICAL RESULT CALLED TO,  READ BACK BY AND VERIFIED WITH: PHARMD JEREMY F [id number] FCP GRAM POSITIVE COCCI IN CHAINS ANAEROBIC BOTTLE ONLY RBV JESICCA CARNIE PHARMD 05/19/2024 BY DD @ 0134    Culture (A)  Final    STAPHYLOCOCCUS AUREUS ABIOTROPHIA DEFECTIVA Standardized susceptibility testing for this organism is not available. Performed at [facility], [address] [place], [place] [address]  Report Status 05/20/2024 FINAL  Final   Organism ID, Bacteria STAPHYLOCOCCUS AUREUS  Final      Susceptibility   Staphylococcus aureus - MIC*    CIPROFLOXACIN  >=8 RESISTANT Resistant     ERYTHROMYCIN <=0.25 SENSITIVE Sensitive     GENTAMICIN <=0.5 SENSITIVE Sensitive     OXACILLIN 0.5 SENSITIVE Sensitive     TETRACYCLINE <=1 SENSITIVE Sensitive     VANCOMYCIN  1 SENSITIVE Sensitive     TRIMETH/SULFA <=10 SENSITIVE Sensitive     CLINDAMYCIN <=0.25 SENSITIVE Sensitive     RIFAMPIN <=0.5 SENSITIVE Sensitive     Inducible Clindamycin NEGATIVE Sensitive     LINEZOLID  2 SENSITIVE Sensitive     * STAPHYLOCOCCUS AUREUS  Blood Culture ID Panel (Reflexed)     Status: Abnormal   Collection Time: 05/17/24  7:15 PM  Result Value Ref Range Status   Enterococcus faecalis NOT DETECTED NOT DETECTED Final   Enterococcus Faecium NOT DETECTED NOT DETECTED Final   Listeria monocytogenes NOT DETECTED NOT DETECTED Final   Staphylococcus species DETECTED (A) NOT DETECTED Final    Comment: CRITICAL RESULT CALLED TO, READ BACK BY AND VERIFIED WITH: PHARMD JEREMY F [id number] FCP    Staphylococcus aureus (BCID) DETECTED (A) NOT DETECTED Final    Comment: CRITICAL RESULT CALLED TO, READ BACK BY AND VERIFIED WITH: PHARMD JEREMY F [id number] FCP    Staphylococcus epidermidis NOT DETECTED NOT DETECTED Final   Staphylococcus lugdunensis NOT DETECTED NOT DETECTED Final   Streptococcus species NOT DETECTED NOT DETECTED Final   Streptococcus agalactiae NOT DETECTED NOT DETECTED Final   Streptococcus pneumoniae NOT DETECTED NOT  DETECTED Final   Streptococcus pyogenes NOT DETECTED NOT DETECTED Final   A.calcoaceticus-baumannii NOT DETECTED NOT DETECTED Final   Bacteroides fragilis NOT DETECTED NOT DETECTED Final   Enterobacterales NOT DETECTED NOT DETECTED Final   Enterobacter cloacae complex NOT DETECTED NOT DETECTED Final   Escherichia coli NOT DETECTED NOT DETECTED Final   Klebsiella aerogenes NOT DETECTED NOT DETECTED Final   Klebsiella oxytoca NOT DETECTED NOT DETECTED Final   Klebsiella pneumoniae NOT DETECTED NOT DETECTED Final   Proteus species NOT DETECTED NOT DETECTED Final   Salmonella species NOT DETECTED NOT DETECTED Final   Serratia marcescens NOT DETECTED NOT DETECTED Final   Haemophilus influenzae NOT DETECTED NOT DETECTED Final   Neisseria meningitidis NOT DETECTED NOT DETECTED Final   Pseudomonas aeruginosa NOT DETECTED NOT DETECTED Final   Stenotrophomonas maltophilia NOT DETECTED NOT DETECTED Final   Candida albicans NOT DETECTED NOT DETECTED Final   Candida auris NOT DETECTED NOT DETECTED Final   Candida glabrata NOT DETECTED NOT DETECTED Final   Candida krusei NOT DETECTED NOT DETECTED Final   Candida parapsilosis NOT DETECTED NOT DETECTED Final   Candida tropicalis NOT DETECTED NOT DETECTED Final   Cryptococcus neoformans/gattii NOT DETECTED NOT DETECTED Final   Meth resistant mecA/C and MREJ NOT DETECTED NOT DETECTED Final    Comment: Performed at [facility], [address] [place], [place] [address]  Blood Culture ID Panel (Reflexed)     Status: None   Collection Time: 05/18/24  7:15 PM  Result Value Ref Range Status   Enterococcus faecalis NOT DETECTED NOT DETECTED Final   Enterococcus Faecium NOT DETECTED NOT DETECTED Final   Listeria monocytogenes NOT DETECTED NOT DETECTED Final   Staphylococcus species NOT DETECTED NOT DETECTED Final   Staphylococcus aureus (BCID) NOT DETECTED NOT DETECTED Final   Staphylococcus epidermidis NOT DETECTED NOT DETECTED Final  Staphylococcus lugdunensis NOT DETECTED NOT DETECTED Final   Streptococcus species NOT DETECTED NOT DETECTED Final   Streptococcus agalactiae NOT DETECTED NOT DETECTED Final   Streptococcus pneumoniae NOT DETECTED NOT DETECTED Final   Streptococcus pyogenes NOT DETECTED NOT DETECTED Final   A.calcoaceticus-baumannii NOT DETECTED NOT DETECTED Final   Bacteroides fragilis NOT DETECTED NOT DETECTED Final   Enterobacterales NOT DETECTED NOT DETECTED Final   Enterobacter cloacae complex NOT DETECTED NOT DETECTED Final   Escherichia coli NOT DETECTED NOT DETECTED Final   Klebsiella aerogenes NOT DETECTED NOT DETECTED Final   Klebsiella oxytoca NOT DETECTED NOT DETECTED Final   Klebsiella pneumoniae NOT DETECTED NOT DETECTED Final   Proteus species NOT DETECTED NOT DETECTED Final   Salmonella species NOT DETECTED NOT DETECTED Final   Serratia marcescens NOT DETECTED NOT DETECTED Final   Haemophilus influenzae NOT DETECTED NOT DETECTED Final   Neisseria meningitidis NOT DETECTED NOT DETECTED Final   Pseudomonas aeruginosa NOT DETECTED NOT DETECTED Final   Stenotrophomonas maltophilia NOT DETECTED NOT DETECTED Final   Candida albicans NOT DETECTED NOT DETECTED Final   Candida auris NOT DETECTED NOT DETECTED Final   Candida glabrata NOT DETECTED NOT DETECTED Final   Candida krusei NOT DETECTED NOT DETECTED Final   Candida parapsilosis NOT DETECTED NOT DETECTED Final   Candida tropicalis NOT DETECTED NOT DETECTED Final   Cryptococcus neoformans/gattii NOT DETECTED NOT DETECTED Final    Comment: Performed at [facility], [address], [place], [place] [address]  Culture, blood (Routine X 2) w Reflex to ID Panel     Status: None (Preliminary result)   Collection Time: 05/19/24  9:04 AM   Specimen: BLOOD LEFT ARM  Result Value Ref Range Status   Specimen Description BLOOD LEFT ARM  Final   Special Requests   Final    BOTTLES DRAWN AEROBIC AND ANAEROBIC Blood Culture adequate volume    Culture   Final    NO GROWTH 2 DAYS Performed at [facility], [address], [place], [place] [address]    Report Status PENDING  Incomplete  Culture, blood (Routine X 2) w Reflex to ID Panel     Status: None (Preliminary result)   Collection Time: 05/19/24  9:12 AM   Specimen: BLOOD LEFT HAND  Result Value Ref Range Status   Specimen Description BLOOD LEFT HAND  Final   Special Requests   Final    BOTTLES DRAWN AEROBIC AND ANAEROBIC Blood Culture results may not be optimal due to an inadequate volume of blood received in culture bottles   Culture   Final    NO GROWTH 2 DAYS Performed at [facility], [address], [place], [place] [address]    Report Status PENDING  Incomplete  C Difficile Quick Screen w PCR reflex     Status: Abnormal   Collection Time: 05/19/24  8:29 PM   Specimen: STOOL  Result Value Ref Range Status   C Diff antigen POSITIVE (A) NEGATIVE Final   C Diff toxin NEGATIVE NEGATIVE Final   C Diff interpretation Results are indeterminate. See PCR results.  Final    Comment: Performed at [facility], [address], [place], [place] [address]  C. Diff by PCR, Reflexed     Status: None   Collection Time: 05/19/24  8:29 PM  Result Value Ref Range Status   Toxigenic C. Difficile by PCR NEGATIVE NEGATIVE Final    Comment: Patient is colonized with non toxigenic C.  difficile. May not need treatment unless significant symptoms are present.   Hypervirulent Strain PRESUMPTIVE NEGATIVE PRESUMPTIVE NEGATIVE Final    Comment: Performed at [facility], [address] [place], [place] [address]  Gastrointestinal Panel by PCR , Stool     Status: None   Collection Time: 05/20/24 12:10 AM   Specimen: Stool  Result Value Ref Range Status   Campylobacter species NOT DETECTED NOT DETECTED Final   Plesimonas shigelloides NOT DETECTED NOT DETECTED Final   Salmonella species NOT DETECTED NOT DETECTED Final   Yersinia enterocolitica NOT DETECTED NOT  DETECTED Final   Vibrio species NOT DETECTED NOT DETECTED Final   Vibrio cholerae NOT DETECTED NOT DETECTED Final   Enteroaggregative E coli (EAEC) NOT DETECTED NOT DETECTED Final   Enteropathogenic E coli (EPEC) NOT DETECTED NOT DETECTED Final   Enterotoxigenic E coli (ETEC) NOT DETECTED NOT DETECTED Final   Shiga like toxin producing E coli (STEC) NOT DETECTED NOT DETECTED Final   Shigella/Enteroinvasive E coli (EIEC) NOT DETECTED NOT DETECTED Final   Cryptosporidium NOT DETECTED NOT DETECTED Final   Cyclospora cayetanensis NOT DETECTED NOT DETECTED Final   Entamoeba histolytica NOT DETECTED NOT DETECTED Final   Giardia lamblia NOT DETECTED NOT DETECTED Final   Adenovirus F40/41 NOT DETECTED NOT DETECTED Final   Astrovirus NOT DETECTED NOT DETECTED Final   Norovirus GI/GII NOT DETECTED NOT DETECTED Final   Rotavirus A NOT DETECTED NOT DETECTED Final   Sapovirus (I, II, IV, and V) NOT DETECTED NOT DETECTED Final    Comment: Performed at [facility], [address], [place], [place] [address]         Radiology Studies: DG CHEST PORT 1 VIEW Result Date: 05/20/2024 EXAM: 1 VIEW(S) XRAY OF THE CHEST 05/20/2024 12:14:01 PM COMPARISON: 05/17/2024 CLINICAL HISTORY: SOB (shortness of breath) FINDINGS: LUNGS AND PLEURA: Chronic coarsened interstitial markings without pulmonary edema. Bronchial wall thickening and reticulonodular opacities in the lower lungs. No pleural effusion. No pneumothorax. HEART AND MEDIASTINUM: Atherosclerotic plaque noted. No acute abnormality of the cardiac and mediastinal silhouettes. BONES AND SOFT TISSUES: No acute osseous abnormality. IMPRESSION: 1. Findings compatible with small airway infection/inflammation similar to CT 1 / 1 / 26 . Electronically signed by: Norman Gatlin MD 05/20/2024 12:37 PM EST RP Workstation: [id number]        Scheduled Meds:  amLODipine   2.5 mg Oral Daily   ascorbic acid   500 mg Oral Daily   budesonide   0.5 mg Nebulization  BID   chlorpheniramine-HYDROcodone   5 mL Oral Q12H   donepezil   5 mg Oral QHS   ezetimibe   10 mg Oral QHS   And   simvastatin   20 mg Oral QHS   guaiFENesin   10 mL Oral Q8H   heparin   5,000 Units Subcutaneous Q8H   insulin  aspart  0-20 Units Subcutaneous TID WC   insulin  aspart  0-5 Units Subcutaneous QHS   insulin  glargine-yfgn  25 Units Subcutaneous QHS   ipratropium-albuterol   3 mL Nebulization Q6H   lactobacillus  1 g Oral TID WC   latanoprost   1 drop Both Eyes QHS   levothyroxine   50 mcg Oral See admin instructions   magnesium  oxide  400 mg Oral Daily   megestrol   600 mg Oral QAC supper   methylPREDNISolone  (SOLU-MEDROL ) injection  40 mg Intravenous Q12H   metoprolol  tartrate  25 mg Oral BID   oseltamivir   30 mg Oral Daily   oxybutynin   10 mg Oral Daily   pantoprazole   40  mg Oral Daily   phosphorus  250 mg Oral QID   potassium chloride   40 mEq Oral Once   sodium chloride  flush  3 mL Intravenous Q12H   sodium chloride  flush  3 mL Intravenous Q12H   zinc  sulfate (50mg  elemental zinc )  220 mg Oral Daily   Continuous Infusions:   ceFAZolin  (ANCEF ) IV Stopped (05/21/24 0357)   magnesium  sulfate bolus IVPB     potassium chloride        LOS: 3 days    Time spent: 55 minutes.    Leatrice Chapel, MD  [facility] 7PM-7AM contact night coverage as above    "

## 2024-05-21 NOTE — Progress Notes (Signed)
 Physical Therapy Treatment Patient Details Name: KAZIYAH PARKISON MRN: [id number] DOB: 03/09/1933 Today's Date: 05/21/2024   History of Present Illness Pt is an 89 y.o. F presenting to [facility] on 05/17/24 with SOB, cough, fatigue, BLE swelling, and generalized weakness. PMH is significant for CHF, HTN, COPD, hypothyroidism, DM T2, and CKD.    PT Comments  Pt demonstrates improved activity tolerance and stability compared to previous PT session, ambulating for increased distances and mobilizing with less physical assistance. Pt does continue to fatigue rather quickly compared to baseline and will benefit from frequent mobilization in an effort to improve endurance. Family are declining potential SNF placement, thus recommendations are updated to HHPT.    If plan is discharge home, recommend the following: A little help with walking and/or transfers;A lot of help with bathing/dressing/bathroom;Assistance with cooking/housework;Assist for transportation;Help with stairs or ramp for entrance   Can travel by private vehicle        Equipment Recommendations  Rolling walker (2 wheels);Wheelchair (measurements PT);BSC/3in1    Recommendations for Other Services       Precautions / Restrictions Precautions Precautions: Fall Recall of Precautions/Restrictions: Intact Restrictions Weight Bearing Restrictions Per Provider Order: No     Mobility  Bed Mobility Overal bed mobility: Needs Assistance Bed Mobility: Supine to Sit, Sit to Supine     Supine to sit: Min assist, HOB elevated Sit to supine: Min assist        Transfers Overall transfer level: Needs assistance Equipment used: Rolling walker (2 wheels) Transfers: Sit to/from Stand Sit to Stand: Contact guard assist                Ambulation/Gait Ambulation/Gait assistance: Contact guard assist Gait Distance (Feet): 60 Feet Assistive device: Rolling walker (2 wheels) Gait Pattern/deviations: Step-to pattern Gait velocity:  reduced Gait velocity interpretation: <1.31 ft/sec, indicative of household ambulator   General Gait Details: slowed step-to gait, increased time to sequence turning, intermittent assistance for RW management when navigating around objects in room   Stairs             Wheelchair Mobility     Tilt Bed    Modified Rankin (Stroke Patients Only)       Balance Overall balance assessment: Needs assistance Sitting-balance support: No upper extremity supported, Feet supported Sitting balance-Leahy Scale: Fair     Standing balance support: Bilateral upper extremity supported, Reliant on assistive device for balance Standing balance-Leahy Scale: Poor                              Communication Communication Communication: Impaired Factors Affecting Communication: Hearing impaired (pt does speak english however daughter does interpret at times)  Cognition Arousal: Alert Behavior During Therapy: Flat affect   PT - Cognitive impairments: History of cognitive impairments, Sequencing, Problem solving, Safety/Judgement                         Following commands: Impaired Following commands impaired: Follows one step commands with increased time, Follows multi-step commands with increased time    Cueing Cueing Techniques: Verbal cues, Tactile cues, Visual cues  Exercises      General Comments General comments (skin integrity, edema, etc.): HR stable, pt on 2L [place] with SpO2 in high 90s      Pertinent Vitals/Pain Pain Assessment Pain Assessment: No/denies pain    Home Living Family/patient expects to be discharged to:: Private residence Living Arrangements: Children Available  Help at Discharge: Family;Available PRN/intermittently (all kids work, but rotate checking in on her at lunch time) Type of Home: House Home Access: Stairs to enter Entrance Stairs-Rails: None Entrance Stairs-Number of Steps: 5   Home Layout: One level Home Equipment:  Rollator (4 wheels);Shower seat (walking stick)      Prior Function            PT Goals (current goals can now be found in the care plan section) Acute Rehab PT Goals Patient Stated Goal: to return home per family Progress towards PT goals: Progressing toward goals    Frequency    Min 2X/week      PT Plan      Co-evaluation              AM-PAC PT 6 Clicks Mobility   Outcome Measure  Help needed turning from your back to your side while in a flat bed without using bedrails?: A Little Help needed moving from lying on your back to sitting on the side of a flat bed without using bedrails?: A Little Help needed moving to and from a bed to a chair (including a wheelchair)?: A Little Help needed standing up from a chair using your arms (e.g., wheelchair or bedside chair)?: A Little Help needed to walk in hospital room?: A Little Help needed climbing 3-5 steps with a railing? : Total 6 Click Score: 16    End of Session Equipment Utilized During Treatment: Gait belt;Oxygen Activity Tolerance: Patient tolerated treatment well Patient left: in bed;with call bell/phone within reach;with bed alarm set;with family/visitor present Nurse Communication: Mobility status PT Visit Diagnosis: Unsteadiness on feet (R26.81);Other abnormalities of gait and mobility (R26.89);Muscle weakness (generalized) (M62.81);Difficulty in walking, not elsewhere classified (R26.2)     Time: 8488-8445 PT Time Calculation (min) (ACUTE ONLY): 43 min  Charges:    $Gait Training: 8-22 mins $Therapeutic Activity: 23-37 mins PT General Charges $$ ACUTE PT VISIT: 1 Visit                     Bernardino JINNY Ruth, PT, DPT Acute Rehabilitation Office [phone]    Bernardino JINNY Ruth 05/21/2024, 4:53 PM

## 2024-05-22 ENCOUNTER — Telehealth (HOSPITAL_COMMUNITY): Payer: Self-pay

## 2024-05-22 ENCOUNTER — Other Ambulatory Visit (HOSPITAL_COMMUNITY): Payer: Self-pay

## 2024-05-22 ENCOUNTER — Inpatient Hospital Stay (HOSPITAL_COMMUNITY)

## 2024-05-22 DIAGNOSIS — Z7189 Other specified counseling: Secondary | ICD-10-CM | POA: Diagnosis not present

## 2024-05-22 DIAGNOSIS — J189 Pneumonia, unspecified organism: Secondary | ICD-10-CM | POA: Diagnosis not present

## 2024-05-22 DIAGNOSIS — A419 Sepsis, unspecified organism: Secondary | ICD-10-CM | POA: Diagnosis not present

## 2024-05-22 DIAGNOSIS — Z515 Encounter for palliative care: Secondary | ICD-10-CM | POA: Diagnosis not present

## 2024-05-22 LAB — RENAL FUNCTION PANEL
Albumin: 3.3 g/dL — ABNORMAL LOW (ref 3.5–5.0)
Anion gap: 14 (ref 5–15)
BUN: 18 mg/dL (ref 8–23)
CO2: 23 mmol/L (ref 22–32)
Calcium: 8.1 mg/dL — ABNORMAL LOW (ref 8.9–10.3)
Chloride: 101 mmol/L (ref 98–111)
Creatinine, Ser: 1.06 mg/dL — ABNORMAL HIGH (ref 0.44–1.00)
GFR, Estimated: 50 mL/min — ABNORMAL LOW
Glucose, Bld: 316 mg/dL — ABNORMAL HIGH (ref 70–99)
Phosphorus: 2.6 mg/dL (ref 2.5–4.6)
Potassium: 3.9 mmol/L (ref 3.5–5.1)
Sodium: 139 mmol/L (ref 135–145)

## 2024-05-22 LAB — GLUCOSE, CAPILLARY
Glucose-Capillary: 268 mg/dL — ABNORMAL HIGH (ref 70–99)
Glucose-Capillary: 260 mg/dL — ABNORMAL HIGH (ref 70–99)
Glucose-Capillary: 221 mg/dL — ABNORMAL HIGH (ref 70–99)
Glucose-Capillary: 237 mg/dL — ABNORMAL HIGH (ref 70–99)

## 2024-05-22 LAB — MAGNESIUM: Magnesium: 2.3 mg/dL (ref 1.7–2.4)

## 2024-05-22 MED ORDER — KATE FARMS STANDARD 1.4 PO LIQD
325.0000 mL | Freq: Two times a day (BID) | ORAL | Status: DC
  Filled 2024-05-22 (×2): qty 325

## 2024-05-22 MED ORDER — POTASSIUM PHOSPHATES 15 MMOLE/5ML IV SOLN
15.0000 mmol | Freq: Once | INTRAVENOUS | Status: AC
  Administered 2024-05-22: 15 mmol via INTRAVENOUS
  Filled 2024-05-22: qty 5

## 2024-05-22 MED ORDER — LINEZOLID 600 MG PO TABS
600.0000 mg | ORAL_TABLET | Freq: Two times a day (BID) | ORAL | 0 refills | Status: AC
  Filled 2024-05-22: qty 42, 21d supply, fill #0

## 2024-05-22 MED ORDER — INSULIN GLARGINE-YFGN 100 UNIT/ML ~~LOC~~ SOLN
30.0000 [IU] | Freq: Every day | SUBCUTANEOUS | Status: DC
  Administered 2024-05-22 – 2024-05-24 (×3): 30 [IU] via SUBCUTANEOUS
  Filled 2024-05-22 (×6): qty 0.3

## 2024-05-22 MED ORDER — ZINC SULFATE 220 (50 ZN) MG PO CAPS
220.0000 mg | ORAL_CAPSULE | Freq: Every day | ORAL | Status: DC
  Administered 2024-05-23 – 2024-05-26 (×4): 220 mg via ORAL
  Filled 2024-05-22 (×5): qty 1

## 2024-05-22 MED ORDER — INSULIN ASPART 100 UNIT/ML IJ SOLN
5.0000 [IU] | Freq: Three times a day (TID) | INTRAMUSCULAR | Status: DC
  Administered 2024-05-22 – 2024-05-25 (×8): 5 [IU] via SUBCUTANEOUS
  Filled 2024-05-22: qty 5
  Filled 2024-05-22 (×2): qty 1
  Filled 2024-05-22 (×7): qty 5

## 2024-05-22 MED ORDER — POTASSIUM PHOSPHATES 15 MMOLE/5ML IV SOLN: 30.0000 mmol | Freq: Once | INTRAVENOUS | Status: DC

## 2024-05-22 NOTE — Telephone Encounter (Signed)
 Pharmacy Patient Advocate Encounter  Insurance verification completed.    The patient is insured through [facility]. Patient has Medicare and is not eligible for a copay card, but may be able to apply for patient assistance or Medicare RX Payment Plan (Patient Must reach out to their plan, if eligible for payment plan), if available.    Ran test claim for Linezolid  600mg  tablet and the current 21 day co-pay is $1.60.   This test claim was processed through [facility]- copay amounts may vary at other pharmacies due to pharmacy/plan contracts, or as the patient moves through the different stages of their insurance plan.

## 2024-05-22 NOTE — Plan of Care (Signed)
" °  Problem: Education: Goal: Ability to describe self-care measures that may prevent or decrease complications (Diabetes Survival Skills Education) will improve 05/22/2024 1342 by Felesia Stahlecker N, RN Outcome: Not Progressing 05/22/2024 1342 by Gail Cathryne SAILOR, RN Outcome: Not Progressing   "

## 2024-05-22 NOTE — Progress Notes (Signed)
 "  Progress Note  Patient Name: Hannah [name] Date of Encounter: 05/22/2024  Primary Cardiologist: Gordy Bergamo, MD   Subjective   Patient seen and examined at her bedside. She was asleep when I arrived and offers no complaints.    Inpatient Medications    Scheduled Meds:  amLODipine   2.5 mg Oral Daily   ascorbic acid   500 mg Oral Daily   budesonide   0.5 mg Nebulization BID   chlorpheniramine-HYDROcodone   5 mL Oral Q12H   donepezil   5 mg Oral QHS   ezetimibe   10 mg Oral QHS   And   simvastatin   20 mg Oral QHS   feeding supplement  237 mL Oral BID BM   guaiFENesin   10 mL Oral Q8H   heparin   5,000 Units Subcutaneous Q8H   insulin  aspart  0-20 Units Subcutaneous TID WC   insulin  aspart  0-5 Units Subcutaneous QHS   insulin  aspart  5 Units Subcutaneous TID WC   insulin  glargine-yfgn  30 Units Subcutaneous QHS   ipratropium-albuterol   3 mL Nebulization Q6H   lactobacillus  1 g Oral TID WC   latanoprost   1 drop Both Eyes QHS   levothyroxine   50 mcg Oral See admin instructions   magnesium  oxide  400 mg Oral Daily   megestrol   600 mg Oral QAC supper   methylPREDNISolone  (SOLU-MEDROL ) injection  40 mg Intravenous Q12H   metoprolol  tartrate  25 mg Oral BID   nystatin   5 mL Oral QID   oxybutynin   10 mg Oral Daily   pantoprazole   40 mg Oral Daily   sodium chloride  flush  3 mL Intravenous Q12H   sodium chloride  flush  3 mL Intravenous Q12H   zinc  sulfate (50mg  elemental zinc )  220 mg Oral Daily   Continuous Infusions:   ceFAZolin  (ANCEF ) IV 2 g (05/22/24 0455)   potassium PHOSPHATE  IVPB (in mmol)     PRN Meds: acetaminophen  **OR** acetaminophen , albuterol , hydrALAZINE , HYDROmorphone  (DILAUDID ) injection, ondansetron  **OR** ondansetron  (ZOFRAN ) IV, oxyCODONE , senna-docusate, traZODone    Vital Signs    Vitals:   05/22/24 0340 05/22/24 0725 05/22/24 0848 05/22/24 0850  BP: (!) 153/68 130/73    Pulse: 83 (!) 102    Resp: 13 19    Temp: 98.6 F (37 C) 98.5 F (36.9 C)     TempSrc: Oral Oral    SpO2: 100% 99% 97% 98%  Weight: 40.5 kg     Height:        Intake/Output Summary (Last 24 hours) at 05/22/2024 1038 Last data filed at 05/22/2024 0818 Gross per 24 hour  Intake 630.4 ml  Output 400 ml  Net 230.4 ml   Filed Weights   05/20/24 0500 05/21/24 0430 05/22/24 0340  Weight: 40.7 kg 38.2 kg 40.5 kg    Telemetry     - Personally Reviewed  ECG     - Personally Reviewed  Physical Exam    General: Comfortable Head: Atraumatic, normal size  Eyes: PEERLA, EOMI  Neck: Supple, normal JVD Cardiac: Normal S1, S2; RRR; no murmurs, rubs, or gallops Lungs: Clear to auscultation bilaterally Abd: Soft, nontender, no hepatomegaly  Ext: warm, no edema Musculoskeletal: No deformities, BUE and BLE strength normal and equal Skin: Warm and dry, no rashes   Neuro: Alert and oriented to person, place, time, and situation, CNII-XII grossly intact, no focal deficits  Psych: Normal mood and affect   Labs    Chemistry Recent Labs  Lab 05/18/24 0315 05/19/24 9095 05/20/24 0215 05/21/24 9771  05/22/24 0926  NA 140   < > 139 139 139  K 4.3   < > 3.6 3.1* 3.9  CL 102   < > 103 101 101  CO2 25   < > 26 26 23   GLUCOSE 291*   < > 166* 192* 316*  BUN 22   < > 15 18 18   CREATININE 1.26*   < > 0.98 1.04* 1.06*  CALCIUM  8.8*   < > 8.1* 8.2* 8.1*  PROT 5.5*  --   --   --   --   ALBUMIN 3.2*  --   --  3.2* 3.3*  AST 47*  --   --   --   --   ALT 31  --   --   --   --   ALKPHOS 49  --   --   --   --   BILITOT <0.2  --   --   --   --   GFRNONAA 41*   < > 55* 51* 50*  ANIONGAP 13   < > 10 12 14    < > = values in this interval not displayed.     Hematology Recent Labs  Lab 05/19/24 0904 05/20/24 0215 05/21/24 0228  WBC 10.3 16.9* 16.0*  RBC 4.06 3.49* 3.52*  HGB 12.0 10.3* 10.2*  HCT 36.5 31.0* 30.9*  MCV 89.9 88.8 87.8  MCH 29.6 29.5 29.0  MCHC 32.9 33.2 33.0  RDW 17.2* 17.2* 16.9*  PLT 260 265 290    Cardiac EnzymesNo results for input(s):  TROPONINI in the last 168 hours. No results for input(s): TROPIPOC in the last 168 hours.   BNP Recent Labs  Lab 05/17/24 1739 05/18/24 1641 05/20/24 0215  PROBNP 453.0* 644.0* 1,916.0*     DDimer No results for input(s): DDIMER in the last 168 hours.   Radiology    DG CHEST PORT 1 VIEW Result Date: 05/20/2024 EXAM: 1 VIEW(S) XRAY OF THE CHEST 05/20/2024 12:14:01 PM COMPARISON: 05/17/2024 CLINICAL HISTORY: SOB (shortness of breath) FINDINGS: LUNGS AND PLEURA: Chronic coarsened interstitial markings without pulmonary edema. Bronchial wall thickening and reticulonodular opacities in the lower lungs. No pleural effusion. No pneumothorax. HEART AND MEDIASTINUM: Atherosclerotic plaque noted. No acute abnormality of the cardiac and mediastinal silhouettes. BONES AND SOFT TISSUES: No acute osseous abnormality. IMPRESSION: 1. Findings compatible with small airway infection/inflammation similar to CT 1 / 1 / 26 . Electronically signed by: Norman Gatlin MD 05/20/2024 12:37 PM EST RP Workstation: [id number]    Cardiac Studies   Echo   Patient Profile     89 y.o. female  hx of HFpEF, HTN, asthma, COPD, mild-moderate MR, hypothyroidism, insulin -dependent type 2 DM, CKD stage IIIa   Assessment & Plan    Chronic diastolic heart failure Mild MR  Atrial tachycardia Sepsis   Clinically she does not appear to be volume overloaded.  We will reassess clinically for need for duiretics daily.   Continue current dose of AV nodal blockers  Please replete her electrolytes. Keep K>4 and Mag > 2 Abx per primary team   We will continue to follow with you    For questions or updates, please contact [facility] Please consult [email or web address] for contact info under Cardiology/STEMI.      Signed, Shakeria Robinette, DO  05/22/2024, 10:38 AM    "

## 2024-05-22 NOTE — Progress Notes (Addendum)
 " PROGRESS NOTE    Hannah [name]  FMW:[id number] DOB: 1932-07-18 DOA: 05/17/2024 PCP: Sun, Vyvyan, MD   Brief Narrative:  This 89 year old female with documented past medical history significant for chronic heart failure with preserved ejection fraction, hypertension, COPD, hypothyroidism, chronic kidney disease stage III and type 2 diabetes mellitus who presented with worsening shortness of breath and edema. Workup done so far showed positive influenza A by PCR, 1 out of 2 blood cultures growing Staphylococcus aureus, UA with positive nitrite, proBNP of 644, magnesium  of 1.6, serum creatinine of 1.1-1.69 (baseline serum creatinine of 1.26) and WBC of 10.3-12.7. Patient is currently being managed for influenza A with pneumonia, bacteremia/sepsis, possible UTI, acute on chronic kidney disease stage IIIa and acute hypoxic respiratory failure. Infectious disease consulted. Patient is currently on IV cefazolin . Echo revealed normal left ventricular ejection fraction and grade 1 diastolic dysfunction.  Has developed diarrhea which is negative for C. difficile.  Cardiology has been consulted for intermittent SVT without symptoms.  Assessment & Plan:   Principal Problem:   Sepsis due to pneumonia [facility]) Active Problems:   Acute hypoxic respiratory failure (HCC)   Influenza A with pneumonia   Chronic obstructive asthma (HCC)   Hypertension   Insulin  dependent type 2 diabetes mellitus (HCC)   CKD stage 3a, GFR 45-59 ml/min (HCC)   Iron  deficiency anemia   UTI (urinary tract infection)   Hypothyroidism   Acute hypoxic respiratory failure: Influenza A with MSSA pneumonia: MSSA bacteremia: Continue Supportive care. Complete course of Tamiflu . Infectious disease team has recommended 7-day course of Tamiflu .   Patient successfully weaned down to room air. Continue cefazolin  2 g every 8 hours.  Given age she is unlikely to be a good candidate for TEE. TTE did not show any evidence of  vegetation. Plan for 14 days of Ancef  if repeat blood cultures no growth to date. Repeat blood cultures have not grown any organism to date. abiotrophia defectiva in culture likely contaminant.   Possible UTI: Urine culture not visualized. Continue antibiotics.   Acute on chronic diastolic CHF: Echo revealed normal left ventricular ejection fraction, with grade 1 diastolic dysfunction. Patient is currently not in respiratory distress. Repeat chest x-ray done on 05/20/2024 did not show any new changes.   Cardiology is following.  Patient does not appear to be volume overloaded. Reassess every day for need for diuretics.     Dementia: No behavioral problems. Continue Aricept .   Loose watery diarrhea: Stool PCR was negative for toxigenic C. difficile, however, was positive for C. difficile antigen. Infectious disease has not recommended any treatment for C. difficile. C. difficile colitis ruled out as per infectious disease team.   Discontinue enteric precaution.  Diarrhea has resolved significantly.   Hypokalemia: Replaced. Resolved.   Hypomagnesemia: Replaced. Resolved.   Hypophosphatemia: Replaced. Resolved    Oral thrush: Continue Nystatin  S/S. Patient has history of oral cancer.   Intermittent SVTs: Not frequent. Optimize electrolytes. Keep potassium greater than 4, magnesium  greater than 2 and phosphorus within normal range. Cardiology is following.  Continue metoprolol  25 mg twice daily.   DVT prophylaxis: Heparin  Code Status: Full code Family Communication: No family at bed side. Disposition Plan:    Status is: Inpatient Remains inpatient appropriate because: Patient admitted for acute hypoxic respiratory failure likely secondary to influenza pneumonia,  cardiology and ID is consulted for positive blood cultures.  Patient not medically ready for discharge.   Consultants:  Infectious disease Cardiology Palliative care  Procedures:  TTE  Antimicrobials: Anti-infectives (From admission, onward)    Start     Dose/Rate Route Frequency Ordered Stop   05/21/24 1230  fluconazole  (DIFLUCAN ) tablet 50 mg  Status:  Discontinued        50 mg Oral Daily 05/21/24 1140 05/21/24 1432   05/18/24 1829  oseltamivir  (TAMIFLU ) capsule 30 mg        30 mg Oral Daily 05/18/24 1829 05/22/24 0941   05/18/24 1545  ceFAZolin  (ANCEF ) IVPB 2g/100 mL premix        2 g 200 mL/hr over 30 Minutes Intravenous Every 12 hours 05/18/24 1451     05/18/24 1415  azithromycin  (ZITHROMAX ) 500 mg in sodium chloride  0.9 % 250 mL IVPB  Status:  Discontinued        500 mg 250 mL/hr over 60 Minutes Intravenous  Once 05/18/24 1326 05/18/24 1332   05/18/24 1415  cefTRIAXone  (ROCEPHIN ) 1 g in sodium chloride  0.9 % 100 mL IVPB  Status:  Discontinued        1 g 200 mL/hr over 30 Minutes Intravenous Every 24 hours 05/18/24 1326 05/18/24 1334   05/17/24 2200  oseltamivir  (TAMIFLU ) capsule 30 mg  Status:  Discontinued        30 mg Oral 2 times daily 05/17/24 2025 05/18/24 1829   05/17/24 2100  ceFEPIme  (MAXIPIME ) 2 g in sodium chloride  0.9 % 100 mL IVPB  Status:  Discontinued        2 g 200 mL/hr over 30 Minutes Intravenous Every 24 hours 05/17/24 2057 05/18/24 1451   05/17/24 2100  vancomycin  (VANCOCIN ) IVPB 1000 mg/200 mL premix        1,000 mg 200 mL/hr over 60 Minutes Intravenous  Once 05/17/24 2057 05/17/24 2324   05/17/24 2056  vancomycin  variable dose per unstable renal function (pharmacist dosing)  Status:  Discontinued         Does not apply See admin instructions 05/17/24 2057 05/18/24 1451   05/17/24 1900  cefTRIAXone  (ROCEPHIN ) 1 g in sodium chloride  0.9 % 100 mL IVPB        1 g 200 mL/hr over 30 Minutes Intravenous  Once 05/17/24 1849 05/17/24 1940   05/17/24 1900  azithromycin  (ZITHROMAX ) 500 mg in sodium chloride  0.9 % 250 mL IVPB        500 mg 250 mL/hr over 60 Minutes Intravenous  Once 05/17/24 1849 05/17/24 2115       Subjective: Patient was  seen and examined at bedside.  Overnight events noted. Patient reports feeling better,  daughter is concerned that patient has abdominal distention but denies any pain.   Objective: Vitals:   05/22/24 0848 05/22/24 0850 05/22/24 1100 05/22/24 1105  BP:   (!) 144/69   Pulse:    88  Resp:   14 14  Temp:    98 F (36.7 C)  TempSrc:    Oral  SpO2: 97% 98%  96%  Weight:      Height:        Intake/Output Summary (Last 24 hours) at 05/22/2024 1405 Last data filed at 05/22/2024 0818 Gross per 24 hour  Intake 570.4 ml  Output 400 ml  Net 170.4 ml   Filed Weights   05/20/24 0500 05/21/24 0430 05/22/24 0340  Weight: 40.7 kg 38.2 kg 40.5 kg    Examination:  General exam: Appears calm and comfortable, deconditioned, not in any acute distress. Respiratory system: Clear to auscultation. Respiratory effort normal.  [id number] Cardiovascular system: S1 & S2 heard, RRR. No JVD,  murmurs, rubs, gallops or clicks. Gastrointestinal system: Abdomen is non distended, soft and non tender.Normal bowel sounds heard. Central nervous system: Alert and oriented x 3. No focal neurological deficits. Extremities: No edema, no cyanosis, no clubbing. Skin: No rashes, lesions or ulcers Psychiatry: Judgement and insight appear normal. Mood & affect appropriate.     Data Reviewed: I have personally reviewed following labs and imaging studies  CBC: Recent Labs  Lab 05/17/24 1721 05/17/24 1728 05/18/24 0231 05/18/24 0315 05/19/24 0904 05/20/24 0215 05/21/24 0228  WBC 12.6*  --   --  12.7* 10.3 16.9* 16.0*  NEUTROABS  --   --   --   --   --   --  12.3*  HGB 12.6   < > 10.5* 10.3* 12.0 10.3* 10.2*  HCT 39.1   < > 31.0* 31.0* 36.5 31.0* 30.9*  MCV 90.1  --   --  89.1 89.9 88.8 87.8  PLT 268  --   --  226 260 265 290   < > = values in this interval not displayed.   Basic Metabolic Panel: Recent Labs  Lab 05/18/24 0315 05/18/24 1641 05/19/24 0904 05/20/24 0215 05/21/24 0228 05/22/24 0926  NA 140  --   138 139 139 139  K 4.3  --  3.5 3.6 3.1* 3.9  CL 102  --  99 103 101 101  CO2 25  --  24 26 26 23   GLUCOSE 291*  --  236* 166* 192* 316*  BUN 22  --  13 15 18 18   CREATININE 1.26*  --  1.11* 0.98 1.04* 1.06*  CALCIUM  8.8*  --  8.6* 8.1* 8.2* 8.1*  MG  --  1.6*  --   --  1.8 2.3  PHOS  --  2.7  --   --  1.4* 2.6   GFR: Estimated Creatinine Clearance: 23 mL/min (A) (by C-G formula based on SCr of 1.06 mg/dL (H)). Liver Function Tests: Recent Labs  Lab 05/18/24 0315 05/21/24 0228 05/22/24 0926  AST 47*  --   --   ALT 31  --   --   ALKPHOS 49  --   --   BILITOT <0.2  --   --   PROT 5.5*  --   --   ALBUMIN 3.2* 3.2* 3.3*   No results for input(s): LIPASE, AMYLASE in the last 168 hours. No results for input(s): AMMONIA in the last 168 hours. Coagulation Profile: No results for input(s): INR, PROTIME in the last 168 hours. Cardiac Enzymes: No results for input(s): CKTOTAL, CKMB, CKMBINDEX, TROPONINI in the last 168 hours. BNP (last 3 results) Recent Labs    05/17/24 1739 05/18/24 1641 05/20/24 0215  PROBNP 453.0* 644.0* 1,916.0*   HbA1C: No results for input(s): HGBA1C in the last 72 hours. CBG: Recent Labs  Lab 05/21/24 1115 05/21/24 1519 05/21/24 2047 05/22/24 0632 05/22/24 1101  GLUCAP 206* 194* 348* 268* 260*   Lipid Profile: No results for input(s): CHOL, HDL, LDLCALC, TRIG, CHOLHDL, LDLDIRECT in the last 72 hours. Thyroid  Function Tests: No results for input(s): TSH, T4TOTAL, FREET4, T3FREE, THYROIDAB in the last 72 hours. Anemia Panel: No results for input(s): VITAMINB12, FOLATE, FERRITIN, TIBC, IRON , RETICCTPCT in the last 72 hours. Sepsis Labs: Recent Labs  Lab 05/18/24 0315 05/18/24 0531 05/18/24 0730 05/18/24 0946 05/20/24 0215  PROCALCITON  --   --   --   --  0.11  LATICACIDVEN 3.3* 3.4* 1.7 1.5  --     Recent Results (from  the past 240 hours)  Resp panel by RT-PCR (RSV, Flu A&B, Covid)  Anterior Nasal Swab     Status: Abnormal   Collection Time: 05/17/24  5:55 PM   Specimen: Anterior Nasal Swab  Result Value Ref Range Status   SARS Coronavirus 2 by RT PCR NEGATIVE NEGATIVE Final    Comment: (NOTE) SARS-CoV-2 target nucleic acids are NOT DETECTED.  The SARS-CoV-2 RNA is generally detectable in upper respiratory specimens during the acute phase of infection. The lowest concentration of SARS-CoV-2 viral copies this assay can detect is 138 copies/mL. A negative result does not preclude SARS-Cov-2 infection and should not be used as the sole basis for treatment or other patient management decisions. A negative result may occur with  improper specimen collection/handling, submission of specimen other than nasopharyngeal swab, presence of viral mutation(s) within the areas targeted by this assay, and inadequate number of viral copies(<138 copies/mL). A negative result must be combined with clinical observations, patient history, and epidemiological information. The expected result is Negative.  Fact Sheet for Patients:  bloggercourse.com  Fact Sheet for Healthcare Providers:  seriousbroker.it  This test is no t yet approved or cleared by the [place]  FDA and  has been authorized for detection and/or diagnosis of SARS-CoV-2 by FDA under an Emergency Use Authorization (EUA). This EUA will remain  in effect (meaning this test can be used) for the duration of the COVID-19 declaration under Section 564(b)(1) of the Act, 21 U.S.C.section 360bbb-3(b)(1), unless the authorization is terminated  or revoked sooner.       Influenza A by PCR POSITIVE (A) NEGATIVE Final   Influenza B by PCR NEGATIVE NEGATIVE Final    Comment: (NOTE) The Xpert Xpress SARS-CoV-2/FLU/RSV plus assay is intended as an aid in the diagnosis of influenza from Nasopharyngeal swab specimens and should not be used as a sole basis for treatment. Nasal  washings and aspirates are unacceptable for Xpert Xpress SARS-CoV-2/FLU/RSV testing.  Fact Sheet for Patients: bloggercourse.com  Fact Sheet for Healthcare Providers: seriousbroker.it  This test is not yet approved or cleared by the [place]  FDA and has been authorized for detection and/or diagnosis of SARS-CoV-2 by FDA under an Emergency Use Authorization (EUA). This EUA will remain in effect (meaning this test can be used) for the duration of the COVID-19 declaration under Section 564(b)(1) of the Act, 21 U.S.C. section 360bbb-3(b)(1), unless the authorization is terminated or revoked.     Resp Syncytial Virus by PCR NEGATIVE NEGATIVE Final    Comment: (NOTE) Fact Sheet for Patients: bloggercourse.com  Fact Sheet for Healthcare Providers: seriousbroker.it  This test is not yet approved or cleared by the [place]  FDA and has been authorized for detection and/or diagnosis of SARS-CoV-2 by FDA under an Emergency Use Authorization (EUA). This EUA will remain in effect (meaning this test can be used) for the duration of the COVID-19 declaration under Section 564(b)(1) of the Act, 21 U.S.C. section 360bbb-3(b)(1), unless the authorization is terminated or revoked.  Performed at [facility], [address] [place], [place] [address]   Culture, blood (single)     Status: Abnormal   Collection Time: 05/17/24  7:15 PM   Specimen: BLOOD LEFT FOREARM  Result Value Ref Range Status   Specimen Description   Final    BLOOD LEFT FOREARM Performed at [facility], [address] [place], [place] [address]    Special Requests   Final    BOTTLES DRAWN AEROBIC AND ANAEROBIC Blood Culture adequate  volume Performed at [facility], [address], [place], [place] [address]    Culture  Setup Time   Final    GRAM POSITIVE COCCI IN  CLUSTERS AEROBIC BOTTLE ONLY CRITICAL RESULT CALLED TO, READ BACK BY AND VERIFIED WITH: PHARMD JEREMY F [id number] FCP GRAM POSITIVE COCCI IN CHAINS ANAEROBIC BOTTLE ONLY RBV JESICCA CARNIE PHARMD 05/19/2024 BY DD @ 0134    Culture (A)  Final    STAPHYLOCOCCUS AUREUS ABIOTROPHIA DEFECTIVA Standardized susceptibility testing for this organism is not available. Performed at [facility], [address] [place], [place] [address]    Report Status 05/20/2024 FINAL  Final   Organism ID, Bacteria STAPHYLOCOCCUS AUREUS  Final      Susceptibility   Staphylococcus aureus - MIC*    CIPROFLOXACIN  >=8 RESISTANT Resistant     ERYTHROMYCIN <=0.25 SENSITIVE Sensitive     GENTAMICIN <=0.5 SENSITIVE Sensitive     OXACILLIN 0.5 SENSITIVE Sensitive     TETRACYCLINE <=1 SENSITIVE Sensitive     VANCOMYCIN  1 SENSITIVE Sensitive     TRIMETH/SULFA <=10 SENSITIVE Sensitive     CLINDAMYCIN <=0.25 SENSITIVE Sensitive     RIFAMPIN <=0.5 SENSITIVE Sensitive     Inducible Clindamycin NEGATIVE Sensitive     LINEZOLID  2 SENSITIVE Sensitive     * STAPHYLOCOCCUS AUREUS  Blood Culture ID Panel (Reflexed)     Status: Abnormal   Collection Time: 05/17/24  7:15 PM  Result Value Ref Range Status   Enterococcus faecalis NOT DETECTED NOT DETECTED Final   Enterococcus Faecium NOT DETECTED NOT DETECTED Final   Listeria monocytogenes NOT DETECTED NOT DETECTED Final   Staphylococcus species DETECTED (A) NOT DETECTED Final    Comment: CRITICAL RESULT CALLED TO, READ BACK BY AND VERIFIED WITH: PHARMD JEREMY F [id number] FCP    Staphylococcus aureus (BCID) DETECTED (A) NOT DETECTED Final    Comment: CRITICAL RESULT CALLED TO, READ BACK BY AND VERIFIED WITH: PHARMD JEREMY F [id number] FCP    Staphylococcus epidermidis NOT DETECTED NOT DETECTED Final   Staphylococcus lugdunensis NOT DETECTED NOT DETECTED Final   Streptococcus species NOT DETECTED NOT DETECTED Final   Streptococcus agalactiae NOT DETECTED NOT  DETECTED Final   Streptococcus pneumoniae NOT DETECTED NOT DETECTED Final   Streptococcus pyogenes NOT DETECTED NOT DETECTED Final   A.calcoaceticus-baumannii NOT DETECTED NOT DETECTED Final   Bacteroides fragilis NOT DETECTED NOT DETECTED Final   Enterobacterales NOT DETECTED NOT DETECTED Final   Enterobacter cloacae complex NOT DETECTED NOT DETECTED Final   Escherichia coli NOT DETECTED NOT DETECTED Final   Klebsiella aerogenes NOT DETECTED NOT DETECTED Final   Klebsiella oxytoca NOT DETECTED NOT DETECTED Final   Klebsiella pneumoniae NOT DETECTED NOT DETECTED Final   Proteus species NOT DETECTED NOT DETECTED Final   Salmonella species NOT DETECTED NOT DETECTED Final   Serratia marcescens NOT DETECTED NOT DETECTED Final   Haemophilus influenzae NOT DETECTED NOT DETECTED Final   Neisseria meningitidis NOT DETECTED NOT DETECTED Final   Pseudomonas aeruginosa NOT DETECTED NOT DETECTED Final   Stenotrophomonas maltophilia NOT DETECTED NOT DETECTED Final   Candida albicans NOT DETECTED NOT DETECTED Final   Candida auris NOT DETECTED NOT DETECTED Final   Candida glabrata NOT DETECTED NOT DETECTED Final   Candida krusei NOT DETECTED NOT DETECTED Final   Candida parapsilosis NOT DETECTED NOT DETECTED Final   Candida tropicalis NOT DETECTED NOT DETECTED Final   Cryptococcus neoformans/gattii NOT DETECTED NOT DETECTED Final   Meth resistant mecA/C and  MREJ NOT DETECTED NOT DETECTED Final    Comment: Performed at [facility], [address] [place], [place] [address]  Blood Culture ID Panel (Reflexed)     Status: None   Collection Time: 05/18/24  7:15 PM  Result Value Ref Range Status   Enterococcus faecalis NOT DETECTED NOT DETECTED Final   Enterococcus Faecium NOT DETECTED NOT DETECTED Final   Listeria monocytogenes NOT DETECTED NOT DETECTED Final   Staphylococcus species NOT DETECTED NOT DETECTED Final   Staphylococcus aureus (BCID) NOT DETECTED NOT DETECTED Final    Staphylococcus epidermidis NOT DETECTED NOT DETECTED Final   Staphylococcus lugdunensis NOT DETECTED NOT DETECTED Final   Streptococcus species NOT DETECTED NOT DETECTED Final   Streptococcus agalactiae NOT DETECTED NOT DETECTED Final   Streptococcus pneumoniae NOT DETECTED NOT DETECTED Final   Streptococcus pyogenes NOT DETECTED NOT DETECTED Final   A.calcoaceticus-baumannii NOT DETECTED NOT DETECTED Final   Bacteroides fragilis NOT DETECTED NOT DETECTED Final   Enterobacterales NOT DETECTED NOT DETECTED Final   Enterobacter cloacae complex NOT DETECTED NOT DETECTED Final   Escherichia coli NOT DETECTED NOT DETECTED Final   Klebsiella aerogenes NOT DETECTED NOT DETECTED Final   Klebsiella oxytoca NOT DETECTED NOT DETECTED Final   Klebsiella pneumoniae NOT DETECTED NOT DETECTED Final   Proteus species NOT DETECTED NOT DETECTED Final   Salmonella species NOT DETECTED NOT DETECTED Final   Serratia marcescens NOT DETECTED NOT DETECTED Final   Haemophilus influenzae NOT DETECTED NOT DETECTED Final   Neisseria meningitidis NOT DETECTED NOT DETECTED Final   Pseudomonas aeruginosa NOT DETECTED NOT DETECTED Final   Stenotrophomonas maltophilia NOT DETECTED NOT DETECTED Final   Candida albicans NOT DETECTED NOT DETECTED Final   Candida auris NOT DETECTED NOT DETECTED Final   Candida glabrata NOT DETECTED NOT DETECTED Final   Candida krusei NOT DETECTED NOT DETECTED Final   Candida parapsilosis NOT DETECTED NOT DETECTED Final   Candida tropicalis NOT DETECTED NOT DETECTED Final   Cryptococcus neoformans/gattii NOT DETECTED NOT DETECTED Final    Comment: Performed at [facility], [address], [place], [place] [address]  Culture, blood (Routine X 2) w Reflex to ID Panel     Status: None (Preliminary result)   Collection Time: 05/19/24  9:04 AM   Specimen: BLOOD LEFT ARM  Result Value Ref Range Status   Specimen Description BLOOD LEFT ARM  Final   Special Requests   Final    BOTTLES  DRAWN AEROBIC AND ANAEROBIC Blood Culture adequate volume   Culture   Final    NO GROWTH 3 DAYS Performed at [facility], [address] [place], [place] [address]    Report Status PENDING  Incomplete  Culture, blood (Routine X 2) w Reflex to ID Panel     Status: None (Preliminary result)   Collection Time: 05/19/24  9:12 AM   Specimen: BLOOD LEFT HAND  Result Value Ref Range Status   Specimen Description BLOOD LEFT HAND  Final   Special Requests   Final    BOTTLES DRAWN AEROBIC AND ANAEROBIC Blood Culture results may not be optimal due to an inadequate volume of blood received in culture bottles   Culture   Final    NO GROWTH 3 DAYS Performed at [facility], [address], [place], [place] [address]    Report Status PENDING  Incomplete  C Difficile Quick Screen w PCR reflex     Status: Abnormal   Collection Time: 05/19/24  8:29 PM   Specimen: STOOL  Result Value Ref Range Status   C Diff antigen POSITIVE (A) NEGATIVE Final   C Diff toxin NEGATIVE NEGATIVE Final   C Diff interpretation Results are indeterminate. See PCR results.  Final    Comment: Performed at [facility], [address], [place], [place] [address]  C. Diff by PCR, Reflexed     Status: None   Collection Time: 05/19/24  8:29 PM  Result Value Ref Range Status   Toxigenic C. Difficile by PCR NEGATIVE NEGATIVE Final    Comment: Patient is colonized with non toxigenic C. difficile. May not need treatment unless significant symptoms are present.   Hypervirulent Strain PRESUMPTIVE NEGATIVE PRESUMPTIVE NEGATIVE Final    Comment: Performed at [facility], [address], [place], [place] [address]  Gastrointestinal Panel by PCR , Stool     Status: None   Collection Time: 05/20/24 12:10 AM   Specimen: Stool  Result Value Ref Range Status   Campylobacter species NOT DETECTED NOT DETECTED Final   Plesimonas shigelloides NOT DETECTED NOT DETECTED Final   Salmonella species NOT DETECTED NOT  DETECTED Final   Yersinia enterocolitica NOT DETECTED NOT DETECTED Final   Vibrio species NOT DETECTED NOT DETECTED Final   Vibrio cholerae NOT DETECTED NOT DETECTED Final   Enteroaggregative E coli (EAEC) NOT DETECTED NOT DETECTED Final   Enteropathogenic E coli (EPEC) NOT DETECTED NOT DETECTED Final   Enterotoxigenic E coli (ETEC) NOT DETECTED NOT DETECTED Final   Shiga like toxin producing E coli (STEC) NOT DETECTED NOT DETECTED Final   Shigella/Enteroinvasive E coli (EIEC) NOT DETECTED NOT DETECTED Final   Cryptosporidium NOT DETECTED NOT DETECTED Final   Cyclospora cayetanensis NOT DETECTED NOT DETECTED Final   Entamoeba histolytica NOT DETECTED NOT DETECTED Final   Giardia lamblia NOT DETECTED NOT DETECTED Final   Adenovirus F40/41 NOT DETECTED NOT DETECTED Final   Astrovirus NOT DETECTED NOT DETECTED Final   Norovirus GI/GII NOT DETECTED NOT DETECTED Final   Rotavirus A NOT DETECTED NOT DETECTED Final   Sapovirus (I, II, IV, and V) NOT DETECTED NOT DETECTED Final    Comment: Performed at [facility], [address], [place], [place] [address]    Radiology Studies: No results found.  Scheduled Meds:  amLODipine   2.5 mg Oral Daily   ascorbic acid   500 mg Oral Daily   budesonide   0.5 mg Nebulization BID   chlorpheniramine-HYDROcodone   5 mL Oral Q12H   donepezil   5 mg Oral QHS   ezetimibe   10 mg Oral QHS   And   simvastatin   20 mg Oral QHS   feeding supplement  237 mL Oral BID BM   feeding supplement (K[employer] STANDARD 1.4) Liquid  325 mL Oral BID BM   guaiFENesin   10 mL Oral Q8H   heparin   5,000 Units Subcutaneous Q8H   insulin  aspart  0-20 Units Subcutaneous TID WC   insulin  aspart  0-5 Units Subcutaneous QHS   insulin  aspart  5 Units Subcutaneous TID WC   insulin  glargine-yfgn  30 Units Subcutaneous QHS   ipratropium-albuterol   3 mL Nebulization Q6H   lactobacillus  1 g Oral TID WC   latanoprost   1 drop Both Eyes QHS   levothyroxine   50 mcg Oral See admin  instructions   magnesium  oxide  400 mg Oral Daily   megestrol   600 mg Oral QAC supper   methylPREDNISolone  (SOLU-MEDROL ) injection  40 mg Intravenous Q12H  metoprolol  tartrate  25 mg Oral BID   nystatin   5 mL Oral QID   oxybutynin   10 mg Oral Daily   pantoprazole   40 mg Oral Daily   sodium chloride  flush  3 mL Intravenous Q12H   sodium chloride  flush  3 mL Intravenous Q12H   zinc  sulfate (50mg  elemental zinc )  220 mg Oral Daily   Continuous Infusions:   ceFAZolin  (ANCEF ) IV 2 g (05/22/24 0455)   potassium PHOSPHATE  IVPB (in mmol) 15 mmol (05/22/24 1208)     LOS: 4 days    Time spent: 50 mins    Darcel Dawley, MD [facility]   If 7PM-7AM, please contact night-coverage  "

## 2024-05-22 NOTE — Progress Notes (Signed)
 "  Palliative Medicine Inpatient Follow Up Note HPI: 89 year old female with documented past medical history significant for chronic heart failure with preserved ejection fraction, hypertension, COPD, hypothyroidism, chronic kidney disease stage III and type 2 diabetes mellitus. Patient presented with worsening shortness of breath and edema, identified to have influenza A and diastolic heart failure. Palliative care asked to support additional goals of care conversations.    Today's Discussion 05/22/2024  *Please note that this is a verbal dictation therefore any spelling or grammatical errors are due to the Dragon Medical One system interpretation.  I reviewed the chart notes including nursing notes from today, progress notes from today. I also reviewed vital signs, nursing flowsheets, medication administrations record, labs, and imaging.    I met with Hannah [name] this morning. She is awake and oriented to self but there is a language barrier.  She has increased distention of her abdomen.  The swelling in her feet appears mild though similar to yesterday.  I called and spoke to patient's daughter, Hannah [name]. Hannah [name] states a variety of concerns related to patient's current clinical state and the lack of communication between the multidisciplinary area teams.  She is upset that patient has not received her lunch and does not understand why she was made nothing by mouth at some point in time.  I shared that I would further investigate this to identify why this happened and provide her with further insights.  She asked - being that she is working with patients this afternoon if I speak directly to her sister Hannah [name].  I was able to request that the nutrition team call patient's daughter Hannah [name] to further review why there have been some dietary changes.  I met with Hannah [name] at bedside this afternoon and reviewed the miscommunication which occurred.  When patient's KUB had been ordered patient was made n.p.o. and then a diet  was reordered this afternoon.  Patient's daughter hoping to speak to the primary hospitalist regarding plan moving forward.  She is most concerned about patient's abdominal distention which is a message I shared I would reflect.  Hope of the family is for medical optimization and transition to home.  Questions and concerns addressed/Palliative Support Provided.   Objective Assessment: Vital Signs Vitals:   05/22/24 1105 05/22/24 1510  BP:    Pulse: 88   Resp: 14   Temp: 98 F (36.7 C)   SpO2: 96% 95%    Intake/Output Summary (Last 24 hours) at 05/22/2024 1519 Last data filed at 05/22/2024 0818 Gross per 24 hour  Intake 389.37 ml  Output 400 ml  Net -10.63 ml   Last Weight  Most recent update: 05/22/2024  3:45 AM    Weight  40.5 kg (89 lb 4.6 oz)            Gen: Elderly Indian female HEENT: Oral thrush, dry mucous membranes CV: Regular rate and rhythm PULM: On 2 L nasal cannula breathing is even and nonlabored ABD: soft/nontender  EXT:   (+) pedal edema Neuro: Alert and oriented -language barrier aware of self and place  SUMMARY OF RECOMMENDATIONS   Full code/ Full scope of care  Patient's family request communication daily from the primary team  Awaiting KUB results results per distention  Ongoing palliative care support ______________________________________________________________________________________ Hannah [name] [facility] Palliative Medicine Team Team Cell Phone: [phone] Please utilize secure chat with additional questions, if there is no response within 30 minutes please call the above phone number  Time Spent: 35  Palliative Medicine Team  providers are available by phone from 7am to 7pm daily and can be reached through the team cell phone.  Should this patient require assistance outside of these hours, please call the patient's attending physician.     "

## 2024-05-22 NOTE — Progress Notes (Addendum)
 Initial Nutrition Assessment  DOCUMENTATION CODES:   Severe malnutrition in context of chronic illness, Underweight  INTERVENTION:  Once diet resumes, recommend: Continue Vegetarian diet per patient preference Consider puree diet if difficulty with denture fit persists Feeding with assistance if family not present to provide assistance Ensure Plus High Protein po BID, each supplement provides 350 kcal and 20 grams of protein. Continue Vitamin C  and Zinc  in setting of acute illness and diarrhea.  Recommend zinc  administration of 14 days   NUTRITION DIAGNOSIS:   Severe Malnutrition related to chronic illness (HF, COPD, CKD, DM2) as evidenced by severe fat depletion, severe muscle depletion.  GOAL:   Patient will meet greater than or equal to 90% of their needs  MONITOR:   PO intake, Supplement acceptance, Labs, Weight trends  REASON FOR ASSESSMENT:   Consult Assessment of nutrition requirement/status  ASSESSMENT:   Pt admitted with worsening shortness of breath and edema, work up with findings of flu A with PNA, bacteremia/sepsis, possible UTI and acute on chronic CKD IIIa. PMH significant for chronic HFpEF, HTN, COPD, hypothyroidism, CKD stage III, and DM2.  Checked in with patient at bedside. NT present assisting with denture placement. Reports that her family is working and will be in later. Patient unable to provide detailed nutrition related history.   NT reports that pt has had difficulty chewing d/t loose fitting dentures, therefore oral intake has been significantly limited. However review of chart reflects pt with report of poor oral intake on admission.   Noted c/o diarrhea on admission.  C diff toxin negative, antigen positive.  Family reports concern for new abdominal distension since admission. Abdominal xray pending.   Addendum: Briefly spoke with patient's daughter Gordy via phone call. Explained brief lapse in diet however pt now returned to vegetarian diet  order. Unfortunately, she will not be here to assist with feeding pt when meal arrives. She plans to ask for ensure at front desk to provide prior to leaving. Secure chat sent to RN and NT to ensure assistance with meal provided if family not present to assist.   Meal completions: 1/2: 35% dinner 1/5: 25% lunch, 25% dinner  Admit weight: 41.5 kg Current weight: 40.5 kg Review of chart reflects pt's highest weight of 46.2 kg on 02/17/24. Since then pt has declined by 12.3% which if accurate, is clinically significant for time frame.   Medications: Vitamin C  500mg  daily, SSI 0-20 units TID, SSI 0-5 units at bedtime, semglee  25 units daily, lactobacillus TID, mag-ox, megace  daily, solu-medrol , oral nystatin  QID, zinc  daily  Labs last updated 1/5: Potassium 3.1 Cr 1.04 Phosphorus 1.4 GFR 51 CBG's 163-348 x24 hours HgbA1c 10.2% (11/27/23)  NUTRITION - FOCUSED PHYSICAL EXAM: Flowsheet Row Most Recent Value  Orbital Region Moderate depletion  Upper Arm Region Severe depletion  Thoracic and Lumbar Region Severe depletion  Buccal Region Mild depletion  Temple Region Moderate depletion  Clavicle Bone Region Severe depletion  Clavicle and Acromion Bone Region Severe depletion  Scapular Bone Region Severe depletion  Dorsal Hand Severe depletion  Patellar Region Severe depletion  Anterior Thigh Region Severe depletion  Posterior Calf Region Moderate depletion  Edema (RD Assessment) None  Hair Reviewed  Eyes Reviewed  Mouth Other (Comment)  [thrush, edentulous]  Skin Reviewed  Nails Reviewed    Diet Order:   Diet Order             Diet vegetarian Room service appropriate? Yes with Assist; Fluid consistency: Thin  Diet effective now  EDUCATION NEEDS:   No education needs have been identified at this time  Skin:  Skin Assessment: Reviewed RN Assessment  Last BM:  1/6 type 6 medium  Height:   Ht Readings from Last 1 Encounters:  05/18/24 5' (1.524 m)     Weight:   Wt Readings from Last 1 Encounters:  05/22/24 40.5 kg   BMI:  Body mass index is 17.44 kg/m.  Estimated Nutritional Needs:   Kcal:  1200-1400  Protein:  60-70g  Fluid:  >/=1.5L  Royce Maris, RDN, LDN Clinical Nutrition See AMiON for contact information.

## 2024-05-22 NOTE — Progress Notes (Signed)
 Came to room for breathing treatment.  Once breathing treatment started pt's heart rate increased to 155.  Breathing treatment stopped and notifying RN now.  No distress note, pt is resting comfortably, sat 97-99% on 2 lpm [place].

## 2024-05-22 NOTE — Progress Notes (Signed)
" ° ° °  [facility]  Date of Admission:  05/17/2024      Reason for Follow Up:   Total days of antibiotics 6         BRIEF PROGRESS NOTE:  Hannah [name] is an 89 y/o female admitted with shortness of breath and wheezing and found to have Influenza A complicated by MSSA bacteremia. Blood cultures remain without growth to date. Recommend continuation of Cefazolin  while inpatient with goal of at least 7 days of IV antibiotics and then transition to linezolid  600 mg PO bid with total of 4 weeks of treatment through 06/16/24. Will arrange follow up in ID clinic. Ok for discharge once appropriate per Internal Medicine.    Greg Eland Lamantia, NP [facility]  05/22/2024  3:14 PM  "

## 2024-05-23 ENCOUNTER — Inpatient Hospital Stay (HOSPITAL_COMMUNITY)

## 2024-05-23 ENCOUNTER — Other Ambulatory Visit (HOSPITAL_COMMUNITY): Payer: Self-pay

## 2024-05-23 DIAGNOSIS — J189 Pneumonia, unspecified organism: Secondary | ICD-10-CM | POA: Diagnosis not present

## 2024-05-23 DIAGNOSIS — Z515 Encounter for palliative care: Secondary | ICD-10-CM | POA: Diagnosis not present

## 2024-05-23 DIAGNOSIS — Z7189 Other specified counseling: Secondary | ICD-10-CM | POA: Diagnosis not present

## 2024-05-23 DIAGNOSIS — A419 Sepsis, unspecified organism: Secondary | ICD-10-CM | POA: Diagnosis not present

## 2024-05-23 LAB — GLUCOSE, CAPILLARY
Glucose-Capillary: 181 mg/dL — ABNORMAL HIGH (ref 70–99)
Glucose-Capillary: 219 mg/dL — ABNORMAL HIGH (ref 70–99)
Glucose-Capillary: 224 mg/dL — ABNORMAL HIGH (ref 70–99)
Glucose-Capillary: 172 mg/dL — ABNORMAL HIGH (ref 70–99)

## 2024-05-23 LAB — RENAL FUNCTION PANEL
Albumin: 3.1 g/dL — ABNORMAL LOW (ref 3.5–5.0)
Anion gap: 11 (ref 5–15)
BUN: 19 mg/dL (ref 8–23)
CO2: 24 mmol/L (ref 22–32)
Calcium: 8 mg/dL — ABNORMAL LOW (ref 8.9–10.3)
Chloride: 105 mmol/L (ref 98–111)
Creatinine, Ser: 0.92 mg/dL (ref 0.44–1.00)
GFR, Estimated: 59 mL/min — ABNORMAL LOW
Glucose, Bld: 145 mg/dL — ABNORMAL HIGH (ref 70–99)
Phosphorus: 2.2 mg/dL — ABNORMAL LOW (ref 2.5–4.6)
Potassium: 4.3 mmol/L (ref 3.5–5.1)
Sodium: 140 mmol/L (ref 135–145)

## 2024-05-23 LAB — CBC
HCT: 29.7 % — ABNORMAL LOW (ref 36.0–46.0)
Hemoglobin: 9.8 g/dL — ABNORMAL LOW (ref 12.0–15.0)
MCH: 29.1 pg (ref 26.0–34.0)
MCHC: 33 g/dL (ref 30.0–36.0)
MCV: 88.1 fL (ref 80.0–100.0)
Platelets: 311 K/uL (ref 150–400)
RBC: 3.37 MIL/uL — ABNORMAL LOW (ref 3.87–5.11)
RDW: 17.3 % — ABNORMAL HIGH (ref 11.5–15.5)
WBC: 20.9 K/uL — ABNORMAL HIGH (ref 4.0–10.5)
nRBC: 0.4 % — ABNORMAL HIGH (ref 0.0–0.2)

## 2024-05-23 LAB — MAGNESIUM: Magnesium: 2.2 mg/dL (ref 1.7–2.4)

## 2024-05-23 MED ORDER — METOPROLOL TARTRATE 25 MG PO TABS
37.5000 mg | ORAL_TABLET | Freq: Two times a day (BID) | ORAL | Status: DC
  Administered 2024-05-23 – 2024-05-24 (×2): 37.5 mg via ORAL
  Filled 2024-05-23 (×2): qty 1

## 2024-05-23 MED ORDER — POLYETHYLENE GLYCOL 3350 17 G PO PACK
17.0000 g | PACK | Freq: Two times a day (BID) | ORAL | Status: DC
  Administered 2024-05-23 – 2024-05-26 (×7): 17 g via ORAL
  Filled 2024-05-23 (×9): qty 1

## 2024-05-23 NOTE — Progress Notes (Signed)
 SLP Cancellation Note  Patient Details Name: Hannah [name] MRN: [id number] DOB: 1933-02-16   Cancelled treatment:       Reason Eval/Treat Not Completed: Patient at procedure or test/unavailable   Lynelle Weiler, Consuelo Fitch 05/23/2024, 2:12 PM

## 2024-05-23 NOTE — Progress Notes (Signed)
 Physical Therapy Treatment Patient Details Name: Hannah [name] MRN: [id number] DOB: 07/09/1932 Today's Date: 05/23/2024   History of Present Illness Pt is an 89 y.o. F presenting to [facility] on 05/17/24 with SOB, cough, fatigue, BLE swelling, and generalized weakness. PMH is significant for CHF, HTN, COPD, hypothyroidism, DM T2, and CKD.    PT Comments  Pt is currently Min A for bed mobility, sit to stand and CGA to Min A for very short distance gait. Pt was limited by incontinence this session and fatigue. Pt will not have assistance at home 24/7 but family would like for pt to return home. Pt needs 24/7 physical assist/supervision at this time. Due to pt current functional status, home set up and available assistance at home recommending skilled physical therapy services 3x/week in order to address strength, balance and functional mobility to decrease risk for falls, injury and re-hospitalization.       If plan is discharge home, recommend the following: A little help with walking and/or transfers;A lot of help with bathing/dressing/bathroom;Assistance with cooking/housework;Assist for transportation;Help with stairs or ramp for entrance   Can travel by private vehicle     No  Equipment Recommendations  Rolling walker (2 wheels);Wheelchair (measurements PT);BSC/3in1       Precautions / Restrictions Precautions Precautions: Fall Recall of Precautions/Restrictions: Intact Restrictions Weight Bearing Restrictions Per Provider Order: No     Mobility  Bed Mobility Overal bed mobility: Needs Assistance Bed Mobility: Sit to Supine       Sit to supine: Min assist   General bed mobility comments: Min A for LE to get to EOB with multi modal cues for sequencing    Transfers Overall transfer level: Needs assistance Equipment used: Rolling walker (2 wheels) Transfers: Sit to/from Stand Sit to Stand: Min assist, Contact guard assist           General transfer comment: min assist to  power up with cues for hand placement. min to CGA for transfers with assistance for walker management    Ambulation/Gait Ambulation/Gait assistance: Contact guard assist Gait Distance (Feet): 10 Feet Assistive device: Rolling walker (2 wheels) Gait Pattern/deviations: Step-to pattern Gait velocity: reduced Gait velocity interpretation: <1.31 ft/sec, indicative of household ambulator   General Gait Details: slowed step-to gait, increased time to sequence turning, intermittent assistance for RW management when navigating around objects in room. Pt started ambualting and then had incontinent episode of urine multiple times in a row      Balance Overall balance assessment: Needs assistance Sitting-balance support: No upper extremity supported, Feet supported Sitting balance-Leahy Scale: Fair Sitting balance - Comments: EOB   Standing balance support: Bilateral upper extremity supported, Reliant on assistive device for balance Standing balance-Leahy Scale: Poor Standing balance comment: reliant on RW for support      Communication Communication Communication: Impaired Factors Affecting Communication: Hearing impaired  Cognition Arousal: Alert Behavior During Therapy: Flat affect   PT - Cognitive impairments: History of cognitive impairments, Sequencing, Problem solving, Safety/Judgement       Following commands: Impaired Following commands impaired: Follows one step commands with increased time, Follows multi-step commands with increased time    Cueing Cueing Techniques: Verbal cues, Tactile cues, Visual cues     General Comments General comments (skin integrity, edema, etc.): noted darkened toenail on the L foot 2nd toe. Daughter states she saw this for the first time today. RN was notified. some red liquid sanguinous drainage came out from toenail      Pertinent Vitals/Pain Pain Assessment  Pain Assessment: Faces Faces Pain Scale: Hurts a little bit Pain Location:  grimacing when coughing and with touching 2nd toe on L foot Pain Descriptors / Indicators: Grimacing Pain Intervention(s): Monitored during session, Repositioned     PT Goals (current goals can now be found in the care plan section) Acute Rehab PT Goals Patient Stated Goal: to return home per family PT Goal Formulation: With patient/family Time For Goal Achievement: 06/02/24 Potential to Achieve Goals: Fair Progress towards PT goals: Progressing toward goals    Frequency    Min 2X/week      PT Plan  Continue with current POC        AM-PAC PT 6 Clicks Mobility   Outcome Measure  Help needed turning from your back to your side while in a flat bed without using bedrails?: A Little Help needed moving from lying on your back to sitting on the side of a flat bed without using bedrails?: A Little Help needed moving to and from a bed to a chair (including a wheelchair)?: A Little Help needed standing up from a chair using your arms (e.g., wheelchair or bedside chair)?: A Little Help needed to walk in hospital room?: A Little Help needed climbing 3-5 steps with a railing? : A Lot 6 Click Score: 17    End of Session Equipment Utilized During Treatment: Gait belt;Oxygen Activity Tolerance: Patient tolerated treatment well;Patient limited by fatigue Patient left: in bed;with call bell/phone within reach;with family/visitor present Nurse Communication: Mobility status PT Visit Diagnosis: Unsteadiness on feet (R26.81);Other abnormalities of gait and mobility (R26.89);Muscle weakness (generalized) (M62.81);Difficulty in walking, not elsewhere classified (R26.2)     Time: 8565-8477 PT Time Calculation (min) (ACUTE ONLY): 48 min  Charges:    $Therapeutic Activity: 38-52 mins PT General Charges $$ ACUTE PT VISIT: 1 Visit                     Dorothyann Maier, DPT, CLT  Acute Rehabilitation Services Office: [phone] (Secure chat preferred)    Dorothyann VEAR Maier 05/23/2024, 4:29 PM

## 2024-05-23 NOTE — Plan of Care (Signed)

## 2024-05-23 NOTE — Progress Notes (Addendum)
 " PROGRESS NOTE    SHELLI PORTILLA  FMW:[id number] DOB: 1933-04-30 DOA: 05/17/2024 PCP: Sun, Vyvyan, MD  Chief Complaint  Patient presents with   Shortness of Breath   Altered Mental Status    Brief Narrative:   89 year old female with past medical history significant for HFpEF, hypertension, COPD, hypothyroidism, chronic kidney disease stage III and type 2 diabetes mellitus who presented with worsening shortness of breath and edema.   She's been diagnosed with influenza, MSSA pneumonia, and MSSA bacteremia.    She's been seen by ID.  Cardiology was c/s for SVT.    Assessment & Plan:   Principal Problem:   Sepsis due to pneumonia [facility]) Active Problems:   Acute hypoxic respiratory failure (HCC)   Influenza Tysheka Fanguy with pneumonia   Chronic obstructive asthma (HCC)   Hypertension   Insulin  dependent type 2 diabetes mellitus (HCC)   CKD stage 3a, GFR 45-59 ml/min (HCC)   Iron  deficiency anemia   UTI (urinary tract infection)   Hypothyroidism  Acute hypoxic respiratory failure Influenza Amritha Yorke with MSSA pneumonia MSSA bacteremia: CT 1/1 with ground glass nodular densities within tree in bud appearance primarily in the lingula and to Charmian Forbis lesser  degree in the LLL 1/1 blood culture with staph aureus and abiotrophia defectiva (suspected contaminant, but follow follow up culture) 1/3 blood culture NGTDx4 Appreciate ID recommendations - continue ancef  while inpatient with goal of 7 days of IV abx and then transition to linezolid  600 mg PO BID with total of 4 weeks of treatment through 06/16/2024. Tamiflu  1/2-1/6 Appreciate ID recs - recommending continue cefazolin  while inpatient (goal at least 7 days IV abx) and transition to linezolid  600 mg PO bid with total of 4 weeks of treatment through 06/16/2024.   Leukocytosis noted today, unclear cause - afebrile - CT CAP today without acute findings in abdomen or pelvis, tree in bud opacities in lingula and ill defined ground glass/nodular opacities in the  mid left upper lobe have decreased, but not resolved.   TTE without notable valvular abnormalities - no plan for TEE Solumedrol likely causing increased white count - no wheezing on exam, will d/c  Possible UTI: No urine culture collected UA at presentation with +LE/nitrites - watch for UTI symptoms   Intermittent SVT: Short intermittent runs Increase metoprolol  to 37.5 mg BID Appreciate cardiology assistance - they've signed off 1/7 (see note).  Replace lytes to maintain K>4 and mag >2.  AKI Concern for Poor UOP Follow bladder scan and strict I/O, daily weights Aki from presentation has improved  Cough Concern for Possible Dysphagia SLP eval  HFpEF Echo revealed normal left ventricular ejection fraction, with grade 1 diastolic dysfunction. Appears euvolemic  Cardiology now signed of 1/7.   Will diurese as needed  LLE Edema LE US     Dementia: Delirium precautions Continue Aricept .   T2DM Steroid induced hyperglycemia Basal, bolus, SSI Stop steroids Monitor  Loose watery diarrhea: + c diff antigen, negative PCR.  C diff ruled out with ID assistance, appreciate assistance - suspected abx associated diarrhea   Hypokalemia: Replaced. Resolved.   Hypomagnesemia: Replaced. Resolved.   Hypophosphatemia: Replaced. Resolved    Oral thrush: Continue Nystatin  S/S. Patient has history of oral cancer.  Severe Protein Calorie Malnutrition RD    DVT prophylaxis: heparin  Code Status: full Family Communication: none Disposition:   Status is: Inpatient Remains inpatient appropriate because: need for continued inpatient care   Consultants:  Palliative care cardiology  Procedures:  Echo IMPRESSIONS  1. Left ventricular ejection fraction, by estimation, is 60 to 65%. The  left ventricle has normal function. The left ventricle has no regional  wall motion abnormalities. Left ventricular diastolic parameters are  consistent with Grade I diastolic   dysfunction (impaired relaxation).   2. Right ventricular systolic function is normal. The right ventricular  size is normal.   3. The mitral valve is normal in structure. Mild mitral valve  regurgitation. No evidence of mitral stenosis.   4. The aortic valve is tricuspid. Aortic valve regurgitation is not  visualized. No aortic stenosis is present.   5. The inferior vena cava is normal in size with greater than 50%  respiratory variability, suggesting right atrial pressure of 3 mmHg.   Antimicrobials:  Anti-infectives (From admission, onward)    Start     Dose/Rate Route Frequency Ordered Stop   05/24/24 0000  linezolid  (ZYVOX ) 600 MG tablet        600 mg Oral 2 times daily 05/22/24 1502 06/14/24 2359   05/21/24 1230  fluconazole  (DIFLUCAN ) tablet 50 mg  Status:  Discontinued        50 mg Oral Daily 05/21/24 1140 05/21/24 1432   05/18/24 1829  oseltamivir  (TAMIFLU ) capsule 30 mg        30 mg Oral Daily 05/18/24 1829 05/22/24 0941   05/18/24 1545  ceFAZolin  (ANCEF ) IVPB 2g/100 mL premix        2 g 200 mL/hr over 30 Minutes Intravenous Every 12 hours 05/18/24 1451     05/18/24 1415  azithromycin  (ZITHROMAX ) 500 mg in sodium chloride  0.9 % 250 mL IVPB  Status:  Discontinued        500 mg 250 mL/hr over 60 Minutes Intravenous  Once 05/18/24 1326 05/18/24 1332   05/18/24 1415  cefTRIAXone  (ROCEPHIN ) 1 g in sodium chloride  0.9 % 100 mL IVPB  Status:  Discontinued        1 g 200 mL/hr over 30 Minutes Intravenous Every 24 hours 05/18/24 1326 05/18/24 1334   05/17/24 2200  oseltamivir  (TAMIFLU ) capsule 30 mg  Status:  Discontinued        30 mg Oral 2 times daily 05/17/24 2025 05/18/24 1829   05/17/24 2100  ceFEPIme  (MAXIPIME ) 2 g in sodium chloride  0.9 % 100 mL IVPB  Status:  Discontinued        2 g 200 mL/hr over 30 Minutes Intravenous Every 24 hours 05/17/24 2057 05/18/24 1451   05/17/24 2100  vancomycin  (VANCOCIN ) IVPB 1000 mg/200 mL premix        1,000 mg 200 mL/hr over 60 Minutes  Intravenous  Once 05/17/24 2057 05/17/24 2324   05/17/24 2056  vancomycin  variable dose per unstable renal function (pharmacist dosing)  Status:  Discontinued         Does not apply See admin instructions 05/17/24 2057 05/18/24 1451   05/17/24 1900  cefTRIAXone  (ROCEPHIN ) 1 g in sodium chloride  0.9 % 100 mL IVPB        1 g 200 mL/hr over 30 Minutes Intravenous  Once 05/17/24 1849 05/17/24 1940   05/17/24 1900  azithromycin  (ZITHROMAX ) 500 mg in sodium chloride  0.9 % 250 mL IVPB        500 mg 250 mL/hr over 60 Minutes Intravenous  Once 05/17/24 1849 05/17/24 2115       Subjective:  Daughter at bedside with many concerns - she helps interpret She's worried about white blood cell count, abdominal distension.  Notes mother was doing better 2 days  ago.  Nilsa, she didn't have any food ordered.  Thinks UOP is low.    Objective: Vitals:   05/23/24 0500 05/23/24 0801 05/23/24 1243 05/23/24 1453  BP:  (!) 141/66 (!) 148/70   Pulse:  84 89   Resp:  18 18   Temp:  98.3 F (36.8 C) 98.7 F (37.1 C)   TempSrc:  Oral Oral   SpO2:  100% 100% 100%  Weight: 40.3 kg     Height:        Intake/Output Summary (Last 24 hours) at 05/23/2024 1953 Last data filed at 05/23/2024 1950 Gross per 24 hour  Intake 3 ml  Output --  Net 3 ml   Filed Weights   05/21/24 0430 05/22/24 0340 05/23/24 0500  Weight: 38.2 kg 40.5 kg 40.3 kg    Examination:  General exam: chronically ill appearing 89 yo Respiratory system: unlabored - occasional cough, coarse, transmitted upper airway sounds Cardiovascular system: RRR - tele reviewed, runs of SVT Gastrointestinal system: distended lower abdomen, nontender to palpation Central nervous system: Alert and oriented. No focal neurological deficits. Extremities: mild LLE edema   Data Reviewed: I have personally reviewed following labs and imaging studies  CBC: Recent Labs  Lab 05/18/24 0315 05/19/24 0904 05/20/24 0215 05/21/24 0228 05/23/24 0228  WBC  12.7* 10.3 16.9* 16.0* 20.9*  NEUTROABS  --   --   --  12.3*  --   HGB 10.3* 12.0 10.3* 10.2* 9.8*  HCT 31.0* 36.5 31.0* 30.9* 29.7*  MCV 89.1 89.9 88.8 87.8 88.1  PLT 226 260 265 290 311    Basic Metabolic Panel: Recent Labs  Lab 05/18/24 1641 05/19/24 0904 05/20/24 0215 05/21/24 0228 05/22/24 0926 05/23/24 0228 05/23/24 0233  NA  --  138 139 139 139  --  140  K  --  3.5 3.6 3.1* 3.9  --  4.3  CL  --  99 103 101 101  --  105  CO2  --  24 26 26 23   --  24  GLUCOSE  --  236* 166* 192* 316*  --  145*  BUN  --  13 15 18 18   --  19  CREATININE  --  1.11* 0.98 1.04* 1.06*  --  0.92  CALCIUM   --  8.6* 8.1* 8.2* 8.1*  --  8.0*  MG 1.6*  --   --  1.8 2.3 2.2  --   PHOS 2.7  --   --  1.4* 2.6  --  2.2*    GFR: Estimated Creatinine Clearance: 26.4 mL/min (by C-G formula based on SCr of 0.92 mg/dL).  Liver Function Tests: Recent Labs  Lab 05/18/24 0315 05/21/24 0228 05/22/24 0926 05/23/24 0233  AST 47*  --   --   --   ALT 31  --   --   --   ALKPHOS 49  --   --   --   BILITOT <0.2  --   --   --   PROT 5.5*  --   --   --   ALBUMIN 3.2* 3.2* 3.3* 3.1*    CBG: Recent Labs  Lab 05/22/24 1558 05/22/24 2212 05/23/24 0802 05/23/24 1232 05/23/24 1740  GLUCAP 221* 237* 181* 219* 224*     Recent Results (from the past 240 hours)  Resp panel by RT-PCR (RSV, Flu Dareen Gutzwiller&B, Covid) Anterior Nasal Swab     Status: Abnormal   Collection Time: 05/17/24  5:55 PM   Specimen: Anterior Nasal Swab  Result  Value Ref Range Status   SARS Coronavirus 2 by RT PCR NEGATIVE NEGATIVE Final    Comment: (NOTE) SARS-CoV-2 target nucleic acids are NOT DETECTED.  The SARS-CoV-2 RNA is generally detectable in upper respiratory specimens during the acute phase of infection. The lowest concentration of SARS-CoV-2 viral copies this assay can detect is 138 copies/mL. Levelle Edelen negative result does not preclude SARS-Cov-2 infection and should not be used as the sole basis for treatment or other patient  management decisions. Yochanan Eddleman negative result may occur with  improper specimen collection/handling, submission of specimen other than nasopharyngeal swab, presence of viral mutation(s) within the areas targeted by this assay, and inadequate number of viral copies(<138 copies/mL). Cass Vandermeulen negative result must be combined with clinical observations, patient history, and epidemiological information. The expected result is Negative.  Fact Sheet for Patients:  bloggercourse.com  Fact Sheet for Healthcare Providers:  seriousbroker.it  This test is no t yet approved or cleared by the [place]  FDA and  has been authorized for detection and/or diagnosis of SARS-CoV-2 by FDA under an Emergency Use Authorization (EUA). This EUA will remain  in effect (meaning this test can be used) for the duration of the COVID-19 declaration under Section 564(b)(1) of the Act, 21 U.S.C.section 360bbb-3(b)(1), unless the authorization is terminated  or revoked sooner.       Influenza Tina Temme by PCR POSITIVE (Cortana Vanderford) NEGATIVE Final   Influenza B by PCR NEGATIVE NEGATIVE Final    Comment: (NOTE) The Xpert Xpress SARS-CoV-2/FLU/RSV plus assay is intended as an aid in the diagnosis of influenza from Nasopharyngeal swab specimens and should not be used as Jezlyn Westerfield sole basis for treatment. Nasal washings and aspirates are unacceptable for Xpert Xpress SARS-CoV-2/FLU/RSV testing.  Fact Sheet for Patients: bloggercourse.com  Fact Sheet for Healthcare Providers: seriousbroker.it  This test is not yet approved or cleared by the [place]  FDA and has been authorized for detection and/or diagnosis of SARS-CoV-2 by FDA under an Emergency Use Authorization (EUA). This EUA will remain in effect (meaning this test can be used) for the duration of the COVID-19 declaration under Section 564(b)(1) of the Act, 21 U.S.C. section  360bbb-3(b)(1), unless the authorization is terminated or revoked.     Resp Syncytial Virus by PCR NEGATIVE NEGATIVE Final    Comment: (NOTE) Fact Sheet for Patients: bloggercourse.com  Fact Sheet for Healthcare Providers: seriousbroker.it  This test is not yet approved or cleared by the [place]  FDA and has been authorized for detection and/or diagnosis of SARS-CoV-2 by FDA under an Emergency Use Authorization (EUA). This EUA will remain in effect (meaning this test can be used) for the duration of the COVID-19 declaration under Section 564(b)(1) of the Act, 21 U.S.C. section 360bbb-3(b)(1), unless the authorization is terminated or revoked.  Performed at [facility], [address], [place], [place] [address]   Culture, blood (single)     Status: Abnormal   Collection Time: 05/17/24  7:15 PM   Specimen: BLOOD LEFT FOREARM  Result Value Ref Range Status   Specimen Description   Final    BLOOD LEFT FOREARM Performed at [facility], [address] [place], [place] [address]    Special Requests   Final    BOTTLES DRAWN AEROBIC AND ANAEROBIC Blood Culture adequate volume Performed at [facility], [address], [place], [place] [address]    Culture  Setup Time   Final    GRAM POSITIVE COCCI IN CLUSTERS AEROBIC BOTTLE ONLY CRITICAL RESULT CALLED TO,  READ BACK BY AND VERIFIED WITH: PHARMD JEREMY F [id number] FCP GRAM POSITIVE COCCI IN CHAINS ANAEROBIC BOTTLE ONLY RBV JESICCA CARNIE PHARMD 05/19/2024 BY DD @ 0134    Culture (Carmon Brigandi)  Final    STAPHYLOCOCCUS AUREUS ABIOTROPHIA DEFECTIVA Standardized susceptibility testing for this organism is not available. Performed at [facility], [address], [place], [place] [address]    Report Status 05/20/2024 FINAL  Final   Organism ID, Bacteria STAPHYLOCOCCUS AUREUS  Final      Susceptibility   Staphylococcus aureus - MIC*     CIPROFLOXACIN  >=8 RESISTANT Resistant     ERYTHROMYCIN <=0.25 SENSITIVE Sensitive     GENTAMICIN <=0.5 SENSITIVE Sensitive     OXACILLIN 0.5 SENSITIVE Sensitive     TETRACYCLINE <=1 SENSITIVE Sensitive     VANCOMYCIN  1 SENSITIVE Sensitive     TRIMETH/SULFA <=10 SENSITIVE Sensitive     CLINDAMYCIN <=0.25 SENSITIVE Sensitive     RIFAMPIN <=0.5 SENSITIVE Sensitive     Inducible Clindamycin NEGATIVE Sensitive     LINEZOLID  2 SENSITIVE Sensitive     * STAPHYLOCOCCUS AUREUS  Blood Culture ID Panel (Reflexed)     Status: Abnormal   Collection Time: 05/17/24  7:15 PM  Result Value Ref Range Status   Enterococcus faecalis NOT DETECTED NOT DETECTED Final   Enterococcus Faecium NOT DETECTED NOT DETECTED Final   Listeria monocytogenes NOT DETECTED NOT DETECTED Final   Staphylococcus species DETECTED (Keeli Roberg) NOT DETECTED Final    Comment: CRITICAL RESULT CALLED TO, READ BACK BY AND VERIFIED WITH: PHARMD JEREMY F [id number] FCP    Staphylococcus aureus (BCID) DETECTED (Lalita Ebel) NOT DETECTED Final    Comment: CRITICAL RESULT CALLED TO, READ BACK BY AND VERIFIED WITH: PHARMD JEREMY F [id number] FCP    Staphylococcus epidermidis NOT DETECTED NOT DETECTED Final   Staphylococcus lugdunensis NOT DETECTED NOT DETECTED Final   Streptococcus species NOT DETECTED NOT DETECTED Final   Streptococcus agalactiae NOT DETECTED NOT DETECTED Final   Streptococcus pneumoniae NOT DETECTED NOT DETECTED Final   Streptococcus pyogenes NOT DETECTED NOT DETECTED Final   Adden Strout.calcoaceticus-baumannii NOT DETECTED NOT DETECTED Final   Bacteroides fragilis NOT DETECTED NOT DETECTED Final   Enterobacterales NOT DETECTED NOT DETECTED Final   Enterobacter cloacae complex NOT DETECTED NOT DETECTED Final   Escherichia coli NOT DETECTED NOT DETECTED Final   Klebsiella aerogenes NOT DETECTED NOT DETECTED Final   Klebsiella oxytoca NOT DETECTED NOT DETECTED Final   Klebsiella pneumoniae NOT DETECTED NOT DETECTED Final   Proteus  species NOT DETECTED NOT DETECTED Final   Salmonella species NOT DETECTED NOT DETECTED Final   Serratia marcescens NOT DETECTED NOT DETECTED Final   Haemophilus influenzae NOT DETECTED NOT DETECTED Final   Neisseria meningitidis NOT DETECTED NOT DETECTED Final   Pseudomonas aeruginosa NOT DETECTED NOT DETECTED Final   Stenotrophomonas maltophilia NOT DETECTED NOT DETECTED Final   Candida albicans NOT DETECTED NOT DETECTED Final   Candida auris NOT DETECTED NOT DETECTED Final   Candida glabrata NOT DETECTED NOT DETECTED Final   Candida krusei NOT DETECTED NOT DETECTED Final   Candida parapsilosis NOT DETECTED NOT DETECTED Final   Candida tropicalis NOT DETECTED NOT DETECTED Final   Cryptococcus neoformans/gattii NOT DETECTED NOT DETECTED Final   Meth resistant mecA/C and MREJ NOT DETECTED NOT DETECTED Final    Comment: Performed at [facility], [address], [place], [place] [address]  Blood Culture ID Panel (Reflexed)     Status: None   Collection  Time: 05/18/24  7:15 PM  Result Value Ref Range Status   Enterococcus faecalis NOT DETECTED NOT DETECTED Final   Enterococcus Faecium NOT DETECTED NOT DETECTED Final   Listeria monocytogenes NOT DETECTED NOT DETECTED Final   Staphylococcus species NOT DETECTED NOT DETECTED Final   Staphylococcus aureus (BCID) NOT DETECTED NOT DETECTED Final   Staphylococcus epidermidis NOT DETECTED NOT DETECTED Final   Staphylococcus lugdunensis NOT DETECTED NOT DETECTED Final   Streptococcus species NOT DETECTED NOT DETECTED Final   Streptococcus agalactiae NOT DETECTED NOT DETECTED Final   Streptococcus pneumoniae NOT DETECTED NOT DETECTED Final   Streptococcus pyogenes NOT DETECTED NOT DETECTED Final   Bonniejean Piano.calcoaceticus-baumannii NOT DETECTED NOT DETECTED Final   Bacteroides fragilis NOT DETECTED NOT DETECTED Final   Enterobacterales NOT DETECTED NOT DETECTED Final   Enterobacter cloacae complex NOT DETECTED NOT DETECTED Final   Escherichia coli  NOT DETECTED NOT DETECTED Final   Klebsiella aerogenes NOT DETECTED NOT DETECTED Final   Klebsiella oxytoca NOT DETECTED NOT DETECTED Final   Klebsiella pneumoniae NOT DETECTED NOT DETECTED Final   Proteus species NOT DETECTED NOT DETECTED Final   Salmonella species NOT DETECTED NOT DETECTED Final   Serratia marcescens NOT DETECTED NOT DETECTED Final   Haemophilus influenzae NOT DETECTED NOT DETECTED Final   Neisseria meningitidis NOT DETECTED NOT DETECTED Final   Pseudomonas aeruginosa NOT DETECTED NOT DETECTED Final   Stenotrophomonas maltophilia NOT DETECTED NOT DETECTED Final   Candida albicans NOT DETECTED NOT DETECTED Final   Candida auris NOT DETECTED NOT DETECTED Final   Candida glabrata NOT DETECTED NOT DETECTED Final   Candida krusei NOT DETECTED NOT DETECTED Final   Candida parapsilosis NOT DETECTED NOT DETECTED Final   Candida tropicalis NOT DETECTED NOT DETECTED Final   Cryptococcus neoformans/gattii NOT DETECTED NOT DETECTED Final    Comment: Performed at [facility], [address] [place], [place] [address]  Culture, blood (Routine X 2) w Reflex to ID Panel     Status: None (Preliminary result)   Collection Time: 05/19/24  9:04 AM   Specimen: BLOOD LEFT ARM  Result Value Ref Range Status   Specimen Description BLOOD LEFT ARM  Final   Special Requests   Final    BOTTLES DRAWN AEROBIC AND ANAEROBIC Blood Culture adequate volume   Culture   Final    NO GROWTH 4 DAYS Performed at [facility], [address], [place], [place] [address]    Report Status PENDING  Incomplete  Culture, blood (Routine X 2) w Reflex to ID Panel     Status: None (Preliminary result)   Collection Time: 05/19/24  9:12 AM   Specimen: BLOOD LEFT HAND  Result Value Ref Range Status   Specimen Description BLOOD LEFT HAND  Final   Special Requests   Final    BOTTLES DRAWN AEROBIC AND ANAEROBIC Blood Culture results may not be optimal due to an inadequate volume of blood received in  culture bottles   Culture   Final    NO GROWTH 4 DAYS Performed at [facility], [address] [place], [place] [address]    Report Status PENDING  Incomplete  C Difficile Quick Screen w PCR reflex     Status: Abnormal   Collection Time: 05/19/24  8:29 PM   Specimen: STOOL  Result Value Ref Range Status   C Diff antigen POSITIVE (Machele Deihl) NEGATIVE Final   C Diff toxin NEGATIVE NEGATIVE Final   C Diff interpretation Results are indeterminate.  See PCR results.  Final    Comment: Performed at [facility], [address] [place], [place] [address]  C. Diff by PCR, Reflexed     Status: None   Collection Time: 05/19/24  8:29 PM  Result Value Ref Range Status   Toxigenic C. Difficile by PCR NEGATIVE NEGATIVE Final    Comment: Patient is colonized with non toxigenic C. difficile. May not need treatment unless significant symptoms are present.   Hypervirulent Strain PRESUMPTIVE NEGATIVE PRESUMPTIVE NEGATIVE Final    Comment: Performed at [facility], [address], [place], [place] [address]  Gastrointestinal Panel by PCR , Stool     Status: None   Collection Time: 05/20/24 12:10 AM   Specimen: Stool  Result Value Ref Range Status   Campylobacter species NOT DETECTED NOT DETECTED Final   Plesimonas shigelloides NOT DETECTED NOT DETECTED Final   Salmonella species NOT DETECTED NOT DETECTED Final   Yersinia enterocolitica NOT DETECTED NOT DETECTED Final   Vibrio species NOT DETECTED NOT DETECTED Final   Vibrio cholerae NOT DETECTED NOT DETECTED Final   Enteroaggregative E coli (EAEC) NOT DETECTED NOT DETECTED Final   Enteropathogenic E coli (EPEC) NOT DETECTED NOT DETECTED Final   Enterotoxigenic E coli (ETEC) NOT DETECTED NOT DETECTED Final   Shiga like toxin producing E coli (STEC) NOT DETECTED NOT DETECTED Final   Shigella/Enteroinvasive E coli (EIEC) NOT DETECTED NOT DETECTED Final   Cryptosporidium NOT DETECTED NOT DETECTED Final   Cyclospora cayetanensis NOT DETECTED  NOT DETECTED Final   Entamoeba histolytica NOT DETECTED NOT DETECTED Final   Giardia lamblia NOT DETECTED NOT DETECTED Final   Adenovirus F40/41 NOT DETECTED NOT DETECTED Final   Astrovirus NOT DETECTED NOT DETECTED Final   Norovirus GI/GII NOT DETECTED NOT DETECTED Final   Rotavirus Dain Laseter NOT DETECTED NOT DETECTED Final   Sapovirus (I, II, IV, and V) NOT DETECTED NOT DETECTED Final    Comment: Performed at [facility], [address] [place], [place] [address]         Radiology Studies: CT CHEST ABDOMEN PELVIS WO CONTRAST Result Date: 05/23/2024 EXAM: CT CHEST, ABDOMEN AND PELVIS WITHOUT CONTRAST 05/23/2024 05:09:22 PM TECHNIQUE: CT of the chest, abdomen and pelvis was performed without the administration of intravenous contrast. Multiplanar reformatted images are provided for review. Automated exposure control, iterative reconstruction, and/or weight based adjustment of the mA/kV was utilized to reduce the radiation dose to as low as reasonably achievable. COMPARISON: Chest CT 05/17/2024. CT angiogram abdomen and pelvis 11/29/2023. CLINICAL HISTORY: Pneumonia, complication suspected, xray done; abdomen pain. FINDINGS: CHEST: MEDIASTINUM AND LYMPH NODES: Heart and pericardium are unremarkable. Coronary and aortic atherosclerotic calcifications are noted. The central airways are clear. No mediastinal, hilar or axillary lymphadenopathy. LUNGS AND PLEURA: Stable scarring in the right lung apex. Tree in bud opacities in the lingula and ill defined ground glass/nodular opacities in the mid left upper lobe have mildly decreased, but have not completely resolved. Linear areas of scarring in the inferior right upper lobe persist. No new focal lung infiltrate. No pleural effusion. No pneumothorax. ABDOMEN AND PELVIS: LIVER: Unremarkable. GALLBLADDER AND BILE DUCTS: Small gallstone is present. No biliary ductal dilatation. SPLEEN: No acute abnormality. PANCREAS: No acute abnormality. ADRENAL GLANDS:  No acute abnormality. KIDNEYS, URETERS AND BLADDER: No stones in the kidneys or ureters. No hydronephrosis. No perinephric or periureteral stranding. Urinary bladder is unremarkable. GI AND BOWEL: Stomach demonstrates no acute abnormality. There is Azjah Pardo large amount of stool throughout  the colon. There is sigmoid colon diverticulosis. The appendix is not visualized. There is no bowel obstruction. REPRODUCTIVE ORGANS: No acute abnormality. PERITONEUM AND RETROPERITONEUM: No ascites. No free air. VASCULATURE: Aorta is normal in caliber. There are atherosclerotic calcifications of the aorta. ABDOMINAL AND PELVIS LYMPH NODES: No lymphadenopathy. BONES AND SOFT TISSUES: The bones are diffusely osteopenic. Changes are seen throughout the lumbar spine. Mild subcutaneous edema lateral to both hips. Tiny focus of subcutaneous air in the lower anterior abdominal wall may represent medication injection site. IMPRESSION: 1. Tree-in-bud opacities in the lingula and ill-defined ground glass/nodular opacities in the mid left upper lobe have mildly decreased but have not completely resolved, with no new focal lung infiltrate. 2. No acute findings in abdomen or pelvis. 3. Sigmoid colon diverticulosis without evidence of diverticulitis. Electronically signed by: Greig Pique MD MD 05/23/2024 07:43 PM EST RP Workstation: [id number]   DG Abd 1 View Result Date: 05/22/2024 EXAM: 1 VIEW XRAY OF THE ABDOMEN 05/22/2024 11:26:00 AM COMPARISON: CT abdomen and pelvis 11/29/2023. CLINICAL HISTORY: Abdominal distention. FINDINGS: BOWEL: Gas and stool throughout the colon. No small or large bowel distention. This indicates Tiondra Fang nonobstructive bowel gas pattern. SOFT TISSUES: No radiopaque stones. Soft tissue contours appear intact. BONES: Degenerative changes in the spine and hips. No acute fracture. LUNG BASES: Lung bases are clear. IMPRESSION: 1. Nonobstructive bowel gas pattern with gas and stool throughout the colon. Electronically signed by:  Elsie Gravely MD 05/22/2024 08:21 PM EST RP Workstation: [id number]        Scheduled Meds:  amLODipine   2.5 mg Oral Daily   ascorbic acid   500 mg Oral Daily   budesonide   0.5 mg Nebulization BID   chlorpheniramine-HYDROcodone   5 mL Oral Q12H   donepezil   5 mg Oral QHS   ezetimibe   10 mg Oral QHS   And   simvastatin   20 mg Oral QHS   feeding supplement  237 mL Oral BID BM   guaiFENesin   10 mL Oral Q8H   heparin   5,000 Units Subcutaneous Q8H   insulin  aspart  0-20 Units Subcutaneous TID WC   insulin  aspart  0-5 Units Subcutaneous QHS   insulin  aspart  5 Units Subcutaneous TID WC   insulin  glargine-yfgn  30 Units Subcutaneous QHS   ipratropium-albuterol   3 mL Nebulization Q6H   lactobacillus  1 g Oral TID WC   latanoprost   1 drop Both Eyes QHS   levothyroxine   50 mcg Oral See admin instructions   magnesium  oxide  400 mg Oral Daily   megestrol   600 mg Oral QAC supper   methylPREDNISolone  (SOLU-MEDROL ) injection  40 mg Intravenous Q12H   metoprolol  tartrate  25 mg Oral BID   nystatin   5 mL Oral QID   oxybutynin   10 mg Oral Daily   pantoprazole   40 mg Oral Daily   polyethylene glycol  17 g Oral BID   sodium chloride  flush  3 mL Intravenous Q12H   sodium chloride  flush  3 mL Intravenous Q12H   zinc  sulfate (50mg  elemental zinc )  220 mg Oral Daily   Continuous Infusions:   ceFAZolin  (ANCEF ) IV 2 g (05/23/24 1703)     LOS: 5 days    Time spent: over 30 min    Meliton Monte, MD [facility]   To contact the attending provider between 7A-7P or the covering provider during after hours 7P-7A, please log into the web site [email or web address] and access using universal [place] password for that web site. If you do not  have the password, please call the hospital operator.  05/23/2024, 7:53 PM    "

## 2024-05-23 NOTE — Progress Notes (Addendum)
" ° °  Palliative Medicine Inpatient Follow Up Note HPI: 89 year old female with documented past medical history significant for chronic heart failure with preserved ejection fraction, hypertension, COPD, hypothyroidism, chronic kidney disease stage III and type 2 diabetes mellitus. Patient presented with worsening shortness of breath and edema, identified to have influenza A and diastolic heart failure. Palliative care asked to support additional goals of care conversations.    Today's Discussion 05/23/2024  *Please note that this is a verbal dictation therefore any spelling or grammatical errors are due to the Dragon Medical One system interpretation.  I reviewed the chart notes including nursing notes from [facility], progress notes from Dr. Sheena, Dorothyann Maier, & Consuelo Deblois. I also reviewed vital signs, nursing flowsheets, medication administrations record, labs uptrending WBC (20.9), and imaging KUB from 1/6 with stool burden.    I met with Hannah [name] who was resting comfortably in bed. I spoke with her daughter, Hannah [name] who was present at bedside. We discussed Hannah [name]'s current clinical condition. We reviewed the concerns Hannah [name] has had since Hannah [name] has been hospitalized inclusive of what is generally felt to be both poor support and poor communication.   We reviewed the ongoing abdominal pain that Hannah [name] has endured as well as her up-trending WBC . Patients daughter is concerned about a lack of urine output. These matters have been brought to the attention ofd Dr. [name] who has ordered additional imaging studies which will be pursued this late afternoon. We reviewed the hope that these provide additional answers to these concerns.   Hannah [name] shares additional family members are flying in from the [place] to offer ongoing support for Hannah [name] during this time.   Questions and concerns addressed/Palliative Support Provided.   Objective Assessment: Vital Signs Vitals:   05/23/24 1243 05/23/24  1453  BP: (!) 148/70   Pulse: 89   Resp: 18   Temp: 98.7 F (37.1 C)   SpO2: 100% 100%   No intake or output data in the 24 hours ending 05/23/24 1742  Last Weight  Most recent update: 05/23/2024  5:59 AM    Weight  40.3 kg (88 lb 13.5 oz)            Gen: Elderly Indian female HEENT: Oral thrush, dry mucous membranes CV: Regular rate and rhythm PULM: On 2 L nasal cannula breathing is even and nonlabored ABD: soft/nontender  EXT:   (+) pedal edema Neuro: Alert and oriented -language barrier aware of self and place  SUMMARY OF RECOMMENDATIONS   Full code/ Full scope of care  Abd distention and pain - Await CT chest/abd pelvis  Patients daughter has received the information on patient experience to express grievances  Primary team working on patients leukocytosis and constipation  Concern UOP decreased - bladder scan ordered  Emotional support provided to patients daughter  Ongoing palliative care support ______________________________________________________________________________________ Rosaline Becton [facility] Palliative Medicine Team Team Cell Phone: [phone] Please utilize secure chat with additional questions, if there is no response within 30 minutes please call the above phone number  Time Spent: 35  Palliative Medicine Team providers are available by phone from 7am to 7pm daily and can be reached through the team cell phone.  Should this patient require assistance outside of these hours, please call the patient's attending physician.     "

## 2024-05-23 NOTE — Progress Notes (Signed)
 Occupational Therapy Treatment Patient Details Name: Hannah [name] MRN: [id number] DOB: 1933-02-14 Today's Date: 05/23/2024   History of present illness Pt is an 89 y.o. F presenting to [facility] on 05/17/24 with SOB, cough, fatigue, BLE swelling, and generalized weakness. PMH is significant for CHF, HTN, COPD, hypothyroidism, DM T2, and CKD.   OT comments  Patient received in supine and agreeable to OT session. Patient requiring mod assist to get to EOB with HR 91 and SpO2 100% on 2 liters with BP 139/73 (93). Patient's O2 was removed and patient ambulated to chair with min assist with HR increasing to 107 and SpO2 95% on RA. Patient required increased time to recover from standing and ambulating before ambulating around bed to recliner. SpO2 96% with HR 104.  Patient left in recliner on 2 liters of O2.  Discharge recommendations continue to be appropriate. Acute OT to continue to follow to address established goals.       If plan is discharge home, recommend the following:  A lot of help with bathing/dressing/bathroom;Assistance with cooking/housework;Direct supervision/assist for medications management;Direct supervision/assist for financial management;Assist for transportation;Help with stairs or ramp for entrance;Supervision due to cognitive status;A little help with walking and/or transfers   Equipment Recommendations  None recommended by OT    Recommendations for Other Services      Precautions / Restrictions Precautions Precautions: Fall Recall of Precautions/Restrictions: Intact Restrictions Weight Bearing Restrictions Per Provider Order: No       Mobility Bed Mobility Overal bed mobility: Needs Assistance Bed Mobility: Supine to Sit     Supine to sit: Mod assist, HOB elevated     General bed mobility comments: difficulty following directions due to language barrier and HoH. required assistance to get to EOB and scoot to EOB    Transfers Overall transfer level: Needs  assistance Equipment used: Rolling walker (2 wheels) Transfers: Sit to/from Stand Sit to Stand: Min assist           General transfer comment: min assist to power up with cues for hand placement. min to CGA for transfers with assistance for walker management     Balance Overall balance assessment: Needs assistance Sitting-balance support: No upper extremity supported, Feet supported Sitting balance-Leahy Scale: Fair Sitting balance - Comments: EOB   Standing balance support: Bilateral upper extremity supported, Reliant on assistive device for balance Standing balance-Leahy Scale: Poor Standing balance comment: reliant on RW for support                           ADL either performed or assessed with clinical judgement   ADL Overall ADL's : Needs assistance/impaired                     Lower Body Dressing: Maximal assistance Lower Body Dressing Details (indicate cue type and reason): donn socks Toilet Transfer: Minimal assistance;Rolling walker (2 wheels) Toilet Transfer Details (indicate cue type and reason): simulated           General ADL Comments: focused on activity tolerance and transfers    Extremity/Trunk Assessment              Vision       Perception     Praxis     Communication Communication Communication: Impaired Factors Affecting Communication: Hearing impaired (pt does speak english however daughter does interpret at times)   Cognition Arousal: Alert Behavior During Therapy: Flat affect Cognition: Cognition impaired, Difficult to assess Difficult  to assess due to: Non-English speaking           OT - Cognition Comments: daughter present for translation                 Following commands: Impaired Following commands impaired: Follows one step commands with increased time, Follows multi-step commands with increased time      Cueing   Cueing Techniques: Verbal cues, Tactile cues, Visual cues  Exercises       Shoulder Instructions       General Comments 100% SpO2 on 2 liters, 94-96% on RA    Pertinent Vitals/ Pain       Pain Assessment Pain Assessment: Faces Faces Pain Scale: Hurts a little bit Pain Location: grimacing when coughing Pain Descriptors / Indicators: Grimacing Pain Intervention(s): Monitored during session, Repositioned  Home Living                                          Prior Functioning/Environment              Frequency  Min 2X/week        Progress Toward Goals  OT Goals(current goals can now be found in the care plan section)  Progress towards OT goals: Progressing toward goals  Acute Rehab OT Goals Patient Stated Goal: get better OT Goal Formulation: With patient/family Time For Goal Achievement: 06/04/24 Potential to Achieve Goals: Fair ADL Goals Pt Will Perform Lower Body Bathing: with modified independence;sit to/from stand;sitting/lateral leans Pt Will Perform Lower Body Dressing: with modified independence;sitting/lateral leans;sit to/from stand Pt Will Transfer to Toilet: with modified independence;ambulating;regular height toilet Pt Will Perform Toileting - Clothing Manipulation and hygiene: with modified independence;sitting/lateral leans;sit to/from stand Pt/caregiver will Perform Home Exercise Program: Increased ROM;Increased strength;Both right and left upper extremity;With theraband;With written HEP provided;With minimal assist Additional ADL Goal #1: Patient will be able to complete functional task in standing for 3 minutes prior to needing seated rest break to increase overall activity tolerance.  Plan      Co-evaluation                 AM-PAC OT 6 Clicks Daily Activity     Outcome Measure   Help from another person eating meals?: A Little Help from another person taking care of personal grooming?: A Little Help from another person toileting, which includes using toliet, bedpan, or urinal?: Total Help  from another person bathing (including washing, rinsing, drying)?: A Lot Help from another person to put on and taking off regular upper body clothing?: A Little Help from another person to put on and taking off regular lower body clothing?: A Lot 6 Click Score: 14    End of Session Equipment Utilized During Treatment: Gait belt;Rolling walker (2 wheels)  OT Visit Diagnosis: Unsteadiness on feet (R26.81);Other abnormalities of gait and mobility (R26.89);Muscle weakness (generalized) (M62.81);Adult, failure to thrive (R62.7)   Activity Tolerance Patient tolerated treatment well   Patient Left in chair;with call bell/phone within reach;with family/visitor present   Nurse Communication Mobility status (O2 on RA)        Time: 8651-8574 OT Time Calculation (min): 37 min  Charges: OT General Charges $OT Visit: 1 Visit OT Treatments $Self Care/Home Management : 8-22 mins $Therapeutic Activity: 8-22 mins  Dick Laine, OTA Acute Rehabilitation Services  Office [phone]   Jeb LITTIE Laine 05/23/2024, 2:46 PM

## 2024-05-23 NOTE — Progress Notes (Signed)
 "  Progress Note  Patient Name: Hannah [name] Date of Encounter: 05/23/2024  Primary Cardiologist: Gordy Bergamo, MD   Subjective   Patient seen and examined at her bedside. She was asleep when I arrived and offers no complaints.    Inpatient Medications    Scheduled Meds:  amLODipine   2.5 mg Oral Daily   ascorbic acid   500 mg Oral Daily   budesonide   0.5 mg Nebulization BID   chlorpheniramine-HYDROcodone   5 mL Oral Q12H   donepezil   5 mg Oral QHS   ezetimibe   10 mg Oral QHS   And   simvastatin   20 mg Oral QHS   feeding supplement  237 mL Oral BID BM   guaiFENesin   10 mL Oral Q8H   heparin   5,000 Units Subcutaneous Q8H   insulin  aspart  0-20 Units Subcutaneous TID WC   insulin  aspart  0-5 Units Subcutaneous QHS   insulin  aspart  5 Units Subcutaneous TID WC   insulin  glargine-yfgn  30 Units Subcutaneous QHS   ipratropium-albuterol   3 mL Nebulization Q6H   lactobacillus  1 g Oral TID WC   latanoprost   1 drop Both Eyes QHS   levothyroxine   50 mcg Oral See admin instructions   magnesium  oxide  400 mg Oral Daily   megestrol   600 mg Oral QAC supper   methylPREDNISolone  (SOLU-MEDROL ) injection  40 mg Intravenous Q12H   metoprolol  tartrate  25 mg Oral BID   nystatin   5 mL Oral QID   oxybutynin   10 mg Oral Daily   pantoprazole   40 mg Oral Daily   sodium chloride  flush  3 mL Intravenous Q12H   sodium chloride  flush  3 mL Intravenous Q12H   zinc  sulfate (50mg  elemental zinc )  220 mg Oral Daily   Continuous Infusions:   ceFAZolin  (ANCEF ) IV 2 g (05/23/24 0307)   PRN Meds: acetaminophen  **OR** acetaminophen , albuterol , hydrALAZINE , HYDROmorphone  (DILAUDID ) injection, ondansetron  **OR** ondansetron  (ZOFRAN ) IV, oxyCODONE , senna-docusate, traZODone    Vital Signs    Vitals:   05/23/24 0227 05/23/24 0458 05/23/24 0500 05/23/24 0801  BP:  (!) 150/67  (!) 141/66  Pulse:  88  84  Resp:    18  Temp:  97.6 F (36.4 C)  98.3 F (36.8 C)  TempSrc:  Oral  Oral  SpO2: 99% 99%  100%   Weight:   40.3 kg   Height:        Intake/Output Summary (Last 24 hours) at 05/23/2024 0904 Last data filed at 05/22/2024 1700 Gross per 24 hour  Intake 964.28 ml  Output --  Net 964.28 ml   Filed Weights   05/21/24 0430 05/22/24 0340 05/23/24 0500  Weight: 38.2 kg 40.5 kg 40.3 kg    Telemetry     Sinus rhythm short runs of svt - Personally Reviewed  ECG     - Personally Reviewed  Physical Exam    General: Comfortable Head: Atraumatic, normal size  Eyes: PEERLA, EOMI  Neck: Supple, normal JVD Cardiac: Normal S1, S2; RRR; no murmurs, rubs, or gallops Lungs: Clear to auscultation bilaterally Abd: Soft, nontender, no hepatomegaly  Ext: warm, no edema Musculoskeletal: No deformities, BUE and BLE strength normal and equal Skin: Warm and dry, no rashes   Neuro: Alert and oriented to person, place, time, and situation, CNII-XII grossly intact, no focal deficits  Psych: Normal mood and affect   Labs    Chemistry Recent Labs  Lab 05/18/24 0315 05/19/24 9095 05/21/24 0228 05/22/24 0926 05/23/24 0233  NA  140   < > 139 139 140  K 4.3   < > 3.1* 3.9 4.3  CL 102   < > 101 101 105  CO2 25   < > 26 23 24   GLUCOSE 291*   < > 192* 316* 145*  BUN 22   < > 18 18 19   CREATININE 1.26*   < > 1.04* 1.06* 0.92  CALCIUM  8.8*   < > 8.2* 8.1* 8.0*  PROT 5.5*  --   --   --   --   ALBUMIN 3.2*  --  3.2* 3.3* 3.1*  AST 47*  --   --   --   --   ALT 31  --   --   --   --   ALKPHOS 49  --   --   --   --   BILITOT <0.2  --   --   --   --   GFRNONAA 41*   < > 51* 50* 59*  ANIONGAP 13   < > 12 14 11    < > = values in this interval not displayed.     Hematology Recent Labs  Lab 05/20/24 0215 05/21/24 0228 05/23/24 0228  WBC 16.9* 16.0* 20.9*  RBC 3.49* 3.52* 3.37*  HGB 10.3* 10.2* 9.8*  HCT 31.0* 30.9* 29.7*  MCV 88.8 87.8 88.1  MCH 29.5 29.0 29.1  MCHC 33.2 33.0 33.0  RDW 17.2* 16.9* 17.3*  PLT 265 290 311    Cardiac EnzymesNo results for input(s): TROPONINI in the  last 168 hours. No results for input(s): TROPIPOC in the last 168 hours.   BNP Recent Labs  Lab 05/17/24 1739 05/18/24 1641 05/20/24 0215  PROBNP 453.0* 644.0* 1,916.0*     DDimer No results for input(s): DDIMER in the last 168 hours.   Radiology    DG Abd 1 View Result Date: 05/22/2024 EXAM: 1 VIEW XRAY OF THE ABDOMEN 05/22/2024 11:26:00 AM COMPARISON: CT abdomen and pelvis 11/29/2023. CLINICAL HISTORY: Abdominal distention. FINDINGS: BOWEL: Gas and stool throughout the colon. No small or large bowel distention. This indicates a nonobstructive bowel gas pattern. SOFT TISSUES: No radiopaque stones. Soft tissue contours appear intact. BONES: Degenerative changes in the spine and hips. No acute fracture. LUNG BASES: Lung bases are clear. IMPRESSION: 1. Nonobstructive bowel gas pattern with gas and stool throughout the colon. Electronically signed by: Elsie Gravely MD 05/22/2024 08:21 PM EST RP Workstation: [id number]    Cardiac Studies   Echo   Patient Profile     89 y.o. female  hx of HFpEF, HTN, asthma, COPD, mild-moderate MR, hypothyroidism, insulin -dependent type 2 DM, CKD stage IIIa   Assessment & Plan    Chronic diastolic heart failure Mild MR  Atrial tachycardia Sepsis   Clinically she does not appear to be volume overloaded.  We will reassess clinically for need for duiretics daily.   Continue current dose of AV nodal blockers, short run of SVT associated with nebulizer treatment  Please replete her electrolytes. Keep K>4 and Mag > 2 Abx per primary team   We weill sign off at this time. Please call with questions    For questions or updates, please contact [facility] Please consult [email or web address] for contact info under Cardiology/STEMI.      Signed, Izsak Meir, DO  05/23/2024, 9:04 AM    "

## 2024-05-23 NOTE — Progress Notes (Signed)
 Transition of Care [facility]) - Inpatient Brief Assessment   Patient Details  Name: Hannah [name] MRN: [id number] Date of Birth: 1932-10-09  [facility] CM/SW Contact:    Hannah JONELLE Joe, RN Phone Number: 05/23/2024, 1:32 PM   Clinical Narrative: CM met with the patient and daughter, Hannah [name] at the bedside to discuss IP Care management needs.  The patient admitted to the hospital for Septicemia and daughter, Hannah [name] plans for patient to return home when medically stable.  The daughter declines SNF placement.  Daughter states that patient has 24 hour care at the home and lives with both daughters at the home.  DME at the home includes shower chair, Rolator and nebulizer at the home.   I offered WC and daughter plans to wait closer to discharge to evaluate need.    Patient's daughter was offered home health services and daughter states that patient has had home health PT in the past but was unaware of the name of the last agency in the home but would get back to me once she speaks with her sister to request the agency back.  Patient will be faxed out in the hub for home health agencies so that I can offer choice to the daughter closer to discharge.  Patient is currently on oxygen in the hospital via [place] but was on RA at home upon admission.  I spoke with Dr. [name] and he will follow up regarding plan for patient.  Patient was recommended for IV antibiotics and is currently on IV antibiotics at this time via PIV.  The daughter states that patient was transferred to [facility] [address] and she would like to speak with the unit manager regarding concerns over the patient's care on the unit.  Daughter was provided [facility] grievance number to call and Hannah [name], Unit RN manager was notified to please speak with the daughter.  Hannah is aware.  CM will continue to follow the patient for patient's return to home when stable - no discharge date at this time.   Transition of Care Asessment: Insurance  and Status: (P) Insurance coverage has been reviewed Patient has primary care physician: (P) Yes Home environment has been reviewed: (P) from home with daughters Prior level of function:: (P) family care at the home Prior/Current Home Services: (P) No current home services Social Drivers of Health Review: (P) SDOH reviewed needs interventions Readmission risk has been reviewed: (P) Yes Transition of care needs: (P) transition of care needs identified, TOC will continue to follow

## 2024-05-23 NOTE — Plan of Care (Signed)
 " Problem: Education: Goal: Ability to describe self-care measures that may prevent or decrease complications (Diabetes Survival Skills Education) will improve 05/23/2024 0651 by Hannah Columbia, RN Outcome: Progressing 05/23/2024 0649 by Hannah Columbia, RN Outcome: Progressing Goal: Individualized Educational Video(s) 05/23/2024 0651 by Hannah Columbia, RN Outcome: Progressing 05/23/2024 0649 by Hannah Columbia, RN Outcome: Progressing   Problem: Coping: Goal: Ability to adjust to condition or change in health will improve 05/23/2024 0651 by Hannah Columbia, RN Outcome: Progressing 05/23/2024 0649 by Hannah Columbia, RN Outcome: Progressing   Problem: Fluid Volume: Goal: Ability to maintain a balanced intake and output will improve 05/23/2024 0651 by Hannah Columbia, RN Outcome: Progressing 05/23/2024 0649 by Hannah Columbia, RN Outcome: Progressing   Problem: Health Behavior/Discharge Planning: Goal: Ability to identify and utilize available resources and services will improve 05/23/2024 0651 by Hannah Columbia, RN Outcome: Progressing 05/23/2024 0649 by Hannah Columbia, RN Outcome: Progressing Goal: Ability to manage health-related needs will improve 05/23/2024 0651 by Hannah Columbia, RN Outcome: Progressing 05/23/2024 0649 by Hannah Columbia, RN Outcome: Progressing   Problem: Metabolic: Goal: Ability to maintain appropriate glucose levels will improve 05/23/2024 0651 by Hannah Columbia, RN Outcome: Progressing 05/23/2024 0649 by Hannah Columbia, RN Outcome: Progressing   Problem: Nutritional: Goal: Maintenance of adequate nutrition will improve 05/23/2024 0651 by Hannah Columbia, RN Outcome: Progressing 05/23/2024 0649 by Hannah Columbia, RN Outcome: Progressing Goal: Progress toward achieving an optimal weight will improve 05/23/2024 0651 by Hannah Columbia, RN Outcome: Progressing 05/23/2024 0649 by Hannah Columbia, RN Outcome:  Progressing   Problem: Skin Integrity: Goal: Risk for impaired skin integrity will decrease 05/23/2024 0651 by Hannah Columbia, RN Outcome: Progressing 05/23/2024 0649 by Hannah Columbia, RN Outcome: Progressing   Problem: Tissue Perfusion: Goal: Adequacy of tissue perfusion will improve 05/23/2024 0651 by Hannah Columbia, RN Outcome: Progressing 05/23/2024 0649 by Hannah Columbia, RN Outcome: Progressing   Problem: Education: Goal: Knowledge of General Education information will improve Description: Including pain rating scale, medication(s)/side effects and non-pharmacologic comfort measures 05/23/2024 0651 by Hannah Columbia, RN Outcome: Progressing 05/23/2024 0649 by Hannah Columbia, RN Outcome: Progressing   Problem: Health Behavior/Discharge Planning: Goal: Ability to manage health-related needs will improve 05/23/2024 0651 by Hannah Columbia, RN Outcome: Progressing 05/23/2024 0649 by Hannah Columbia, RN Outcome: Progressing   Problem: Clinical Measurements: Goal: Ability to maintain clinical measurements within normal limits will improve 05/23/2024 0651 by Hannah Columbia, RN Outcome: Progressing 05/23/2024 0649 by Hannah Columbia, RN Outcome: Progressing Goal: Will remain free from infection 05/23/2024 0651 by Hannah Columbia, RN Outcome: Progressing 05/23/2024 0649 by Hannah Columbia, RN Outcome: Progressing Goal: Diagnostic test results will improve 05/23/2024 0651 by Hannah Columbia, RN Outcome: Progressing 05/23/2024 0649 by Hannah Columbia, RN Outcome: Progressing Goal: Respiratory complications will improve 05/23/2024 0651 by Hannah Columbia, RN Outcome: Progressing 05/23/2024 0649 by Hannah Columbia, RN Outcome: Progressing Goal: Cardiovascular complication will be avoided 05/23/2024 0651 by Hannah Columbia, RN Outcome: Progressing 05/23/2024 0649 by Hannah Columbia, RN Outcome: Progressing   Problem: Activity: Goal: Risk  for activity intolerance will decrease 05/23/2024 0651 by Hannah Columbia, RN Outcome: Progressing 05/23/2024 0649 by Hannah Columbia, RN Outcome: Progressing   Problem: Nutrition: Goal: Adequate nutrition will be maintained 05/23/2024 0651 by Hannah Columbia, RN Outcome: Progressing 05/23/2024 0649 by Hannah Columbia, RN Outcome: Progressing   Problem: Coping: Goal: Level of anxiety will decrease 05/23/2024 0651 by Hannah Columbia, RN Outcome: Progressing 05/23/2024 0649 by Hannah Columbia, RN Outcome: Progressing   Problem: Elimination: Goal: Will not experience complications related to bowel motility 05/23/2024  [id number] by Hannah Columbia, RN Outcome: Progressing 05/23/2024 0649 by Hannah Columbia, RN Outcome: Progressing Goal: Will not experience complications related to urinary retention 05/23/2024 0651 by Hannah Columbia, RN Outcome: Progressing 05/23/2024 0649 by Hannah Columbia, RN Outcome: Progressing   Problem: Pain Managment: Goal: General experience of comfort will improve and/or be controlled 05/23/2024 0651 by Hannah Columbia, RN Outcome: Progressing 05/23/2024 0649 by Hannah Columbia, RN Outcome: Progressing   Problem: Safety: Goal: Ability to remain free from injury will improve 05/23/2024 0651 by Hannah Columbia, RN Outcome: Progressing 05/23/2024 0649 by Hannah Columbia, RN Outcome: Progressing   Problem: Skin Integrity: Goal: Risk for impaired skin integrity will decrease 05/23/2024 0651 by Hannah Columbia, RN Outcome: Progressing 05/23/2024 0649 by Hannah Columbia, RN Outcome: Progressing   "

## 2024-05-24 ENCOUNTER — Other Ambulatory Visit: Payer: Self-pay

## 2024-05-24 ENCOUNTER — Other Ambulatory Visit (HOSPITAL_COMMUNITY): Payer: Self-pay

## 2024-05-24 ENCOUNTER — Inpatient Hospital Stay (HOSPITAL_COMMUNITY)

## 2024-05-24 DIAGNOSIS — J189 Pneumonia, unspecified organism: Secondary | ICD-10-CM | POA: Diagnosis not present

## 2024-05-24 DIAGNOSIS — R609 Edema, unspecified: Secondary | ICD-10-CM

## 2024-05-24 DIAGNOSIS — E43 Unspecified severe protein-calorie malnutrition: Secondary | ICD-10-CM | POA: Insufficient documentation

## 2024-05-24 DIAGNOSIS — A419 Sepsis, unspecified organism: Secondary | ICD-10-CM | POA: Diagnosis not present

## 2024-05-24 LAB — CBC WITH DIFFERENTIAL/PLATELET
Abs Immature Granulocytes: 3.4 K/uL — ABNORMAL HIGH (ref 0.00–0.07)
Basophils Absolute: 0.1 K/uL (ref 0.0–0.1)
Basophils Relative: 0 %
Eosinophils Absolute: 0 K/uL (ref 0.0–0.5)
Eosinophils Relative: 0 %
HCT: 31.3 % — ABNORMAL LOW (ref 36.0–46.0)
Hemoglobin: 10.4 g/dL — ABNORMAL LOW (ref 12.0–15.0)
Immature Granulocytes: 13 %
Lymphocytes Relative: 9 %
Lymphs Abs: 2.4 K/uL (ref 0.7–4.0)
MCH: 29.3 pg (ref 26.0–34.0)
MCHC: 33.2 g/dL (ref 30.0–36.0)
MCV: 88.2 fL (ref 80.0–100.0)
Monocytes Absolute: 2.4 K/uL — ABNORMAL HIGH (ref 0.1–1.0)
Monocytes Relative: 9 %
Neutro Abs: 19 K/uL — ABNORMAL HIGH (ref 1.7–7.7)
Neutrophils Relative %: 69 %
Platelets: 340 K/uL (ref 150–400)
RBC: 3.55 MIL/uL — ABNORMAL LOW (ref 3.87–5.11)
RDW: 17.2 % — ABNORMAL HIGH (ref 11.5–15.5)
Smear Review: NORMAL
WBC: 27.3 K/uL — ABNORMAL HIGH (ref 4.0–10.5)
nRBC: 0.8 % — ABNORMAL HIGH (ref 0.0–0.2)

## 2024-05-24 LAB — COMPREHENSIVE METABOLIC PANEL WITH GFR
ALT: 18 U/L (ref 0–44)
AST: 45 U/L — ABNORMAL HIGH (ref 15–41)
Albumin: 3.2 g/dL — ABNORMAL LOW (ref 3.5–5.0)
Alkaline Phosphatase: 46 U/L (ref 38–126)
Anion gap: 10 (ref 5–15)
BUN: 18 mg/dL (ref 8–23)
CO2: 24 mmol/L (ref 22–32)
Calcium: 8.3 mg/dL — ABNORMAL LOW (ref 8.9–10.3)
Chloride: 101 mmol/L (ref 98–111)
Creatinine, Ser: 0.95 mg/dL (ref 0.44–1.00)
GFR, Estimated: 57 mL/min — ABNORMAL LOW
Glucose, Bld: 168 mg/dL — ABNORMAL HIGH (ref 70–99)
Potassium: 4.9 mmol/L (ref 3.5–5.1)
Sodium: 134 mmol/L — ABNORMAL LOW (ref 135–145)
Total Bilirubin: 0.3 mg/dL (ref 0.0–1.2)
Total Protein: 5.4 g/dL — ABNORMAL LOW (ref 6.5–8.1)

## 2024-05-24 LAB — CULTURE, BLOOD (ROUTINE X 2)
Culture: NO GROWTH
Culture: NO GROWTH
Special Requests: ADEQUATE

## 2024-05-24 LAB — GLUCOSE, CAPILLARY
Glucose-Capillary: 151 mg/dL — ABNORMAL HIGH (ref 70–99)
Glucose-Capillary: 281 mg/dL — ABNORMAL HIGH (ref 70–99)
Glucose-Capillary: 156 mg/dL — ABNORMAL HIGH (ref 70–99)
Glucose-Capillary: 113 mg/dL — ABNORMAL HIGH (ref 70–99)

## 2024-05-24 LAB — PHOSPHORUS: Phosphorus: 1.8 mg/dL — ABNORMAL LOW (ref 2.5–4.6)

## 2024-05-24 LAB — MAGNESIUM: Magnesium: 2.3 mg/dL (ref 1.7–2.4)

## 2024-05-24 MED ORDER — HEPARIN (PORCINE) 25000 UT/250ML-% IV SOLN
550.0000 [IU]/h | INTRAVENOUS | Status: DC
  Administered 2024-05-24: 650 [IU]/h via INTRAVENOUS
  Filled 2024-05-24: qty 250

## 2024-05-24 MED ORDER — HEPARIN BOLUS VIA INFUSION
2000.0000 [IU] | Freq: Once | INTRAVENOUS | Status: AC
  Administered 2024-05-24: 2000 [IU] via INTRAVENOUS
  Filled 2024-05-24: qty 2000

## 2024-05-24 MED ORDER — FLUCONAZOLE 100 MG PO TABS
100.0000 mg | ORAL_TABLET | Freq: Once | ORAL | Status: AC
  Administered 2024-05-24: 100 mg via ORAL
  Filled 2024-05-24: qty 1

## 2024-05-24 MED ORDER — FLUCONAZOLE 50 MG PO TABS
50.0000 mg | ORAL_TABLET | Freq: Every day | ORAL | Status: DC
  Administered 2024-05-25 – 2024-05-29 (×4): 50 mg via ORAL
  Filled 2024-05-24 (×5): qty 1

## 2024-05-24 MED ORDER — LACTATED RINGERS IV SOLN: INTRAVENOUS | Status: DC

## 2024-05-24 MED ORDER — THIAMINE MONONITRATE 100 MG PO TABS
100.0000 mg | ORAL_TABLET | Freq: Every day | ORAL | Status: DC
  Administered 2024-05-24 – 2024-05-26 (×3): 100 mg via ORAL
  Filled 2024-05-24 (×4): qty 1

## 2024-05-24 MED ORDER — CHLORHEXIDINE GLUCONATE CLOTH 2 % EX PADS
6.0000 | MEDICATED_PAD | Freq: Every day | CUTANEOUS | Status: DC
  Administered 2024-05-25 – 2024-06-01 (×5): 6 via TOPICAL

## 2024-05-24 MED ORDER — SODIUM CHLORIDE 0.9% FLUSH: 10.0000 mL | INTRAVENOUS | Status: DC | PRN

## 2024-05-24 MED ORDER — ADULT MULTIVITAMIN W/MINERALS CH
1.0000 | ORAL_TABLET | Freq: Every day | ORAL | Status: DC
  Administered 2024-05-24 – 2024-05-26 (×3): 1 via ORAL
  Filled 2024-05-24 (×4): qty 1

## 2024-05-24 MED ORDER — METOPROLOL SUCCINATE ER 25 MG PO TB24
25.0000 mg | ORAL_TABLET | Freq: Two times a day (BID) | ORAL | Status: DC
  Administered 2024-05-24 – 2024-05-25 (×2): 25 mg via ORAL
  Filled 2024-05-24 (×3): qty 1

## 2024-05-24 NOTE — Progress Notes (Addendum)
 Nutrition - Brief Note  Consult received for assessment of nutrition status. RD assessment previously completed on 1/6. All recommended nutrition interventions currently in place. D/W RN who reports continued poor PO intake with just bites of food per patient's daughter, the patient is drinking about 1 Ensure daily. Palliative care on board - full scope of treatment for now, patient's family to arrive from [place] to provide additional support. S/P FEES with SLP today = reg/thin.   INTERVENTION:  Multivitamin PO once daily. 100 mg thiamine  PO once daily for 7 days. Magic Cup TID. Each supplement provides 290 Kcals and 9 grams of protein. Continue vegetarian diet with room service and feeding assistance. Continue encouraging Ensure Plus High Protein PO BID. Each supplement provides 350 Kcals and 20 grams of protein. Continue Megace  for appetite stimulation. Please see full RD note on 1/6 for greater details. RD will continue to follow.  Hannah Ruth, MS, RDN, LDN [place]. [facility] See AMION for contact information Epic/secure chat preferred

## 2024-05-24 NOTE — Progress Notes (Signed)
 Left lower extremity venous duplex has been completed.  Results can be found in chart review under CV Proc.  05/24/2024 7:04 PM  Sallyann Kinnaird Elden Appl, RVT.

## 2024-05-24 NOTE — Evaluation (Addendum)
 Clinical/Bedside Swallow Evaluation Patient Details  Name: Hannah [name] MRN: [id number] Date of Birth: 10/02/1932  Today's Date: 05/24/2024 Time: SLP Start Time (ACUTE ONLY): 0915 SLP Stop Time (ACUTE ONLY): 1010 SLP Time Calculation (min) (ACUTE ONLY): 55 min  Past Medical History:  Past Medical History:  Diagnosis Date   COVID-19    Depression    Diabetes mellitus    Hypercholesteremia    Hypertension    Hypothyroidism    Oral cancer (HCC) 2012   surgery and radiation   Spinal stenosis    Tuberculosis    1978   Weight loss, unintentional    Past Surgical History:  Past Surgical History:  Procedure Laterality Date   AORTIC ARCH ANGIOGRAPHY  09/2019   BACK SURGERY     COLONOSCOPY     EYE SURGERY Bilateral    cataracts   LIPOMA EXCISION     back   LUMBAR LAMINECTOMY/DECOMPRESSION MICRODISCECTOMY Right 04/12/2018   Procedure: Laminectomy and Foraminotomy - Lumbar four-Lumbar five - Lumbar five-Sacral one - right;  Surgeon: Joshua Alm RAMAN, MD;  Location: [facility] OR;  Service: Neurosurgery;  Laterality: Right;   LUMBAR SPINE SURGERY  05/2019   MASS EXCISION Left 04/02/2019   Procedure: EXCISION OF LIPOMATOUS MASS OF THE LEFT BACK;  Surgeon: Gladis Cough, MD;  Location: [facility];  Service: General;  Laterality: Left;   OTHER SURGICAL HISTORY     squamous cell removal;skin graft   RIGHT/LEFT HEART CATH AND CORONARY ANGIOGRAPHY N/A 09/18/2019   Procedure: RIGHT/LEFT HEART CATH AND CORONARY ANGIOGRAPHY;  Surgeon: Ladona Heinz, MD;  Location: [facility] INVASIVE CV LAB;  Service: Cardiovascular;  Laterality: N/A;   TOOTH EXTRACTION     all pulled   HPI:  89yo female admitted from home 05/17/24 with SOB, AMS, cough, fatigue, BLE swelling, and generalized weakness. FLU-A + CTChest: Tree-in-bud opacities in the lingula, ill-defined ground glass/nodular opacities in mid LUL have mildly decreased but have not completely resolved, no new focal lung infiltrate. FEES 06/2017: flash  pen thin, reg/thin. PMH: CHF, HFpEF, HTN, COPD, hypothyroidism, DM2, CKD3a.    Assessment / Plan / Recommendation  Clinical Impression  Pt presents with increased risk for aspiration in the setting of multiple issues. Pt with respiratory difficulties related to flu, deconditioning, oral thrush, and history of oral cancer s/p radiation treatment. Pt is significantly weak with poor endurance.   Pt had upper and lower dentures in place. Daughter removed dentures and cleaned them. SLP did not replace dentures during PO trials, due to being ill-fitting and pt with oral irritation from them. SLP set up suction at bedside and oral care was completed.   Following oral care, pt accepted ice chips x2 with minimal oral manipulation observed. Increase noted in congested breath sounds. Pt accepted sips of water via straw, and again exhibited increase in congested breath sounds without overt cough. 1/2 teaspoon boluses of puree (pt accepted only 1/2 of bolus presented) appeared to be tolerated. Minimal laryngeal elevation per palpation.   Given current presentation in addition to multiple high risk indicators, instrumental study is recommended to objectively assess swallow physiology and safety and to make recommendation for least restrictive diet. Recommend FEES at bedside given pt limited strength and endurance, and to allow visualization of pharyngeal structures given oral thrush and odynophagia. MD and daughter in agreement. Will schedule for this date. Diet/medication recommendations to follow. Continue PO meds with puree in the interim.  SLP Visit Diagnosis: Dysphagia, unspecified (R13.10)    Aspiration Risk  Severe aspiration risk;Risk for inadequate nutrition/hydration    Diet Recommendation  (pending FEES)   PT is vegetarian       Other Recommendations Oral Care Recommendations: Oral care QID;Oral care before and after PO Caregiver Recommendations: Have oral suction available     Swallow  Evaluation Recommendations Caregiver Recommendations: Have oral suction available   Assistance Recommended at Discharge  TBD  Functional Status Assessment Patient has had a recent decline in their functional status and/or demonstrates limited ability to make significant improvements in function in a reasonable and predictable amount of time   Frequency and Duration  (pending FEES)          Prognosis Prognosis for improved oropharyngeal function: Fair Barriers to Reach Goals: Other (Comment) (advanced age, multiple comorbidities) Barriers/Prognosis Comment: Language barrier      Swallow Study   General Date of Onset: 05/17/24 HPI: 89yo female admitted from home 05/17/24 with SOB, AMS, cough, fatigue, BLE swelling, and generalized weakness. FLU-A + CTChest: Tree-in-bud opacities in the lingula, ill-defined ground glass/nodular opacities in mid LUL have mildly decreased but have not completely resolved, no new focal lung infiltrate. FEES 06/2017: flash pen thin, reg/thin. PMH: CHF, HFpEF, HTN, COPD, hypothyroidism, DM2, CKD3a. Type of Study: Bedside Swallow Evaluation Previous Swallow Assessment: FEES 06/2017: flash pen thins, rec reg/thin Diet Prior to this Study: Regular;Thin liquids (Level 0) (vegetarian) Temperature Spikes Noted: No Respiratory Status: Nasal cannula History of Recent Intubation: No Behavior/Cognition: Alert;Cooperative;Requires cueing Oral Cavity Assessment: Other (comment) (thrush) Oral Care Completed by SLP: Yes (suction set up to facilitate thorough oral care) Oral Cavity - Dentition: Dentures, top;Dentures, bottom Vision: Functional for self-feeding Self-Feeding Abilities: Total assist Patient Positioning: Upright in bed Baseline Vocal Quality: Low vocal intensity Volitional Cough: Weak;Congested Volitional Swallow: Able to elicit    Oral/Motor/Sensory Function Overall Oral Motor/Sensory Function: Generalized oral weakness   Ice Chips Ice chips:  Impaired Presentation: Spoon Oral Phase Impairments: Impaired mastication   Thin Liquid Thin Liquid: Impaired Presentation: Straw Pharyngeal  Phase Impairments: Other (comments) (increase in congested breath sounds. No overt cough/choking)    Nectar Thick Nectar Thick Liquid: Not tested   Honey Thick Honey Thick Liquid: Not tested   Puree Puree: Impaired Presentation: Spoon Pharyngeal Phase Impairments: Other (comments) (increase in congested breath sounds. No overt cough/choking)   Solid     Solid: Not tested     Fidela Cieslak B. Dory, MSP, CCC-SLP Speech Language Pathologist Office: [phone]  [name] 05/24/2024,10:34 AM

## 2024-05-24 NOTE — Progress Notes (Signed)
 PHARMACY - ANTICOAGULATION CONSULT NOTE  Pharmacy Consult for IV Heparin  Indication: DVT  Allergies[1]  Patient Measurements: Height: 5' (152.4 cm) Weight: 40.3 kg (88 lb 13.5 oz) IBW/kg (Calculated) : 45.5 HEPARIN  DW (KG): 41.5  Vital Signs: Temp: 98.9 F (37.2 C) (01/08 1157) Temp Source: Axillary (01/08 1005) BP: 126/64 (01/08 1157) Pulse Rate: 95 (01/08 1157)  Labs: Recent Labs    05/22/24 0926 05/23/24 0228 05/23/24 0233 05/24/24 0853  HGB  --  9.8*  --  10.4*  HCT  --  29.7*  --  31.3*  PLT  --  311  --  340  CREATININE 1.06*  --  0.92 0.95    Estimated Creatinine Clearance: 25.5 mL/min (by C-G formula based on SCr of 0.95 mg/dL).   Medical History: Past Medical History:  Diagnosis Date   COVID-19    Depression    Diabetes mellitus    Hypercholesteremia    Hypertension    Hypothyroidism    Oral cancer (HCC) 2012   surgery and radiation   Spinal stenosis    Tuberculosis    1978   Weight loss, unintentional     Medications:  Medications Prior to Admission  Medication Sig Dispense Refill Last Dose/Taking   albuterol  (PROVENTIL ) (2.5 MG/3ML) 0.083% nebulizer solution Take 3 mLs (2.5 mg total) by nebulization every 4 (four) hours as needed for wheezing or shortness of breath. 75 mL 5 05/17/2024 Morning   albuterol  (VENTOLIN  HFA) 108 (90 Base) MCG/ACT inhaler Inhale 2 puffs into the lungs 3 (three) times daily for 3 days, THEN 2 puffs every 6 (six) hours as needed for wheezing or shortness of breath. 18 g 0 05/17/2024 Morning   amLODipine  (NORVASC ) 2.5 MG tablet Take 1 tablet (2.5 mg total) by mouth daily. 30 tablet 0 05/17/2024 Morning   arformoterol  (BROVANA ) 15 MCG/2ML NEBU Take 2 mLs (15 mcg total) by nebulization 2 (two) times daily. 120 mL 6 05/17/2024 Morning   BAQSIMI ONE PACK 3 MG/DOSE POWD 1 spray by Other route See admin instructions. Use 1 spray in 1 nostril once as directed for severe low blood sugar (hypoglycemia). Administer 1 spray into affected  nostril(s) as needed for hypoglycemic emergencies.   Unknown   benzonatate (TESSALON) 100 MG capsule Take 100 mg by mouth 3 (three) times daily as needed for cough.   Past Week   budesonide  (PULMICORT ) 0.5 MG/2ML nebulizer solution Take 2 mLs (0.5 mg total) by nebulization in the morning and at bedtime. 360 mL 5 05/17/2024 Bedtime   Ca Carbonate-Mag Hydroxide 1000-200 MG CHEW Chew 1 tablet by mouth daily.   Past Week   Cholecalciferol  (VITAMIN D -3) 125 MCG (5000 UT) TABS Take 5,000 Units by mouth daily.   Past Week   Coenzyme Q10 (CO Q 10 PO) Take 1 capsule by mouth daily.   05/17/2024 Morning   Cyanocobalamin  (B-12 SL) Place 1 tablet under the tongue daily.   05/17/2024 Morning   donepezil  (ARICEPT ) 5 MG tablet Take 5 mg by mouth at bedtime.   Past Week   ezetimibe -simvastatin  (VYTORIN ) 10-20 MG per tablet Take 1 tablet by mouth at bedtime.   Past Week   Finerenone  (KERENDIA ) 10 MG TABS Take 1 tablet (10 mg total) by mouth daily. 30 tablet 2 05/17/2024 Morning   furosemide  (LASIX ) 20 MG tablet TAKE 2 TO 3 TIMES WEEKLY (Patient taking differently: Take 20 mg by mouth daily.) 90 tablet 1 05/17/2024 Morning   HYDROcodone -acetaminophen  (NORCO/VICODIN) 5-325 MG tablet Take 1 tablet by mouth 2 (  two) times daily.   05/17/2024 Morning   insulin  glargine (LANTUS  SOLOSTAR) 100 UNIT/ML Solostar Pen Inject 20 Units into the skin at bedtime. (Patient taking differently: Inject 14 Units into the skin at bedtime.) 15 mL 0 05/17/2024 Bedtime   insulin  lispro (HUMALOG) 100 UNIT/ML KwikPen Inject 2-12 Units into the skin See admin instructions. Inject 2-12 units into the skin three times a day with meals, per sliding scale   05/17/2024 Morning   JANUVIA 50 MG tablet Take 50 mg by mouth daily.   05/17/2024 Morning   latanoprost  (XALATAN ) 0.005 % ophthalmic solution Place 1 drop into both eyes at bedtime.   Past Week   levothyroxine  (SYNTHROID ) 50 MCG tablet Take 1 tablet (50 mcg total) by mouth See admin instructions. Take 25mcg  alternating 50mcg every other day. 30 tablet 0 05/17/2024 Morning   lidocaine  (XYLOCAINE ) 2 % solution Use as directed 3 mLs in the mouth or throat daily as needed for mouth pain.   Past Week   magnesium  oxide (MAG-OX) 400 (240 Mg) MG tablet Take 400 mg by mouth daily.   Past Week   megestrol  (MEGACE ) 400 MG/10ML suspension Take 600 mg by mouth daily before supper.   Past Week   omeprazole  (PRILOSEC) 20 MG capsule Take 1 capsule (20 mg total) by mouth every other day. 30 capsule 1 05/17/2024 Morning   ondansetron  (ZOFRAN ) 4 MG tablet Take 1 tablet (4 mg total) by mouth every 8 (eight) hours as needed for nausea or vomiting. 12 tablet 0 Past Month   oxybutynin  (DITROPAN -XL) 10 MG 24 hr tablet Take 10 mg by mouth daily.   05/17/2024 Morning   simethicone  (MYLICON) 80 MG chewable tablet Chew 1 tablet (80 mg total) by mouth every 6 (six) hours as needed for flatulence.   Past Month   Vibegron (GEMTESA) 75 MG TABS Take 75 mg by mouth daily.   05/17/2024 Morning   Continuous Glucose Sensor (FREESTYLE LIBRE 14 DAY SENSOR) MISC Inject 1 Device into the skin every 14 (fourteen) days.      diclofenac  sodium (VOLTAREN ) 1 % GEL Apply 2 g topically 3 (three) times daily as needed (joint pain- affected areas).      doxycycline  (VIBRAMYCIN ) 100 MG capsule Take 1 capsule (100 mg total) by mouth 2 (two) times daily. (Patient not taking: Reported on 05/18/2024) 14 capsule 0 Not Taking   metoprolol  tartrate (LOPRESSOR ) 25 MG tablet Take 1 tablet (25 mg total) by mouth 2 (two) times daily. (Patient not taking: Reported on 05/18/2024) 60 tablet 0 Not Taking   predniSONE  (DELTASONE ) 10 MG tablet Take 2 tablets (20 mg total) by mouth daily. (Patient not taking: Reported on 05/18/2024) 10 tablet 0 Not Taking   Scheduled:   amLODipine   2.5 mg Oral Daily   ascorbic acid   500 mg Oral Daily   budesonide   0.5 mg Nebulization BID   Chlorhexidine  Gluconate Cloth  6 each Topical Daily   chlorpheniramine-HYDROcodone   5 mL Oral Q12H    donepezil   5 mg Oral QHS   ezetimibe   10 mg Oral QHS   And   simvastatin   20 mg Oral QHS   feeding supplement  237 mL Oral BID BM   [START ON 05/25/2024] fluconazole   50 mg Oral Daily   guaiFENesin   10 mL Oral Q8H   insulin  aspart  0-20 Units Subcutaneous TID WC   insulin  aspart  0-5 Units Subcutaneous QHS   insulin  aspart  5 Units Subcutaneous TID WC   insulin  glargine-yfgn  30 Units Subcutaneous QHS   ipratropium-albuterol   3 mL Nebulization Q6H   lactobacillus  1 g Oral TID WC   latanoprost   1 drop Both Eyes QHS   levothyroxine   50 mcg Oral See admin instructions   magnesium  oxide  400 mg Oral Daily   metoprolol  succinate  25 mg Oral Q12H   multivitamin with minerals  1 tablet Oral Daily   pantoprazole   40 mg Oral Daily   polyethylene glycol  17 g Oral BID   sodium chloride  flush  3 mL Intravenous Q12H   sodium chloride  flush  3 mL Intravenous Q12H   thiamine   100 mg Oral Daily   zinc  sulfate (50mg  elemental zinc )  220 mg Oral Daily   Infusions:    ceFAZolin  (ANCEF ) IV 2 g (05/24/24 1713)   lactated ringers       Assessment: 89 years of age female with HFpEF, HTN, asthma, COPD, mild-moderate MR, hypothyroidism, insulin -dependent type 2 DM, CKD stage IIIa admitted with worsening shortness of breath and edema on 1/1, found to have femoral vein DVT on LE dopplers 1/7. Pharmacy consulted for IV Heparin . Patient has not been on anticoagulants prior to admission and has been on subcutaneous Heparin  for DVT prophylaxis during stay with last dose of 5000 units subcutaneously given at 14:36 PM. Hgb is stable at 10.4. Platelets are within normal limits. No bleeding reported.   Will plan for low bolus with recent subcutaneous administration, CKD stage IIIa, and low body weight.   Goal of Therapy:  Heparin  level 0.3-0.7 units/ml Monitor platelets by anticoagulation protocol: Yes   Plan:  Give 2000 units bolus x 1 Start heparin  infusion at 650 units/hr Check anti-Xa level in 8 hours and  daily while on heparin  Continue to monitor H&H and platelets  Annalaya Wile, PharmD Please refer to [facility] for [facility] Pharmacy numbers 05/24/2024,6:00 PM      [1]  Allergies Allergen Reactions   Penicillins Rash and Other (See Comments)    1980's   Sulfa Antibiotics Rash

## 2024-05-24 NOTE — Plan of Care (Signed)

## 2024-05-24 NOTE — Plan of Care (Signed)
 Palliative:  Requested by palliative team member to follow patient. Thorough chart review - had FEES earlier today - ordered regular diet, thin liquids.  CT CAP w/o acute findings. Steroids dc'd d/t leukocytosis. Cardiology signed off. Ongoing thrush - transitioning to fluconazole .   Went to bedside - patient and family at bedside both sleeping soundly. Given that no outstanding palliative needs were identified in chart review, I did not wake them.  PMT will continue to follow and assess for palliative needs.  Tobey Jama Barnacle, DNP, AGNP-C Palliative Medicine Team Team Phone # [phone]  Pager # [phone]

## 2024-05-24 NOTE — Progress Notes (Signed)
 Peripherally Inserted Central Catheter Placement  The IV Nurse has discussed with the patient and/or persons authorized to consent for the patient, the purpose of this procedure and the potential benefits and risks involved with this procedure.  The benefits include less needle sticks, lab draws from the catheter, and the patient may be discharged home with the catheter. Risks include, but not limited to, infection, bleeding, blood clot (thrombus formation), and puncture of an artery; nerve damage and irregular heartbeat and possibility to perform a PICC exchange if needed/ordered by physician.  Alternatives to this procedure were also discussed.  Bard Power PICC patient education guide, fact sheet on infection prevention and patient information card has been provided to patient /or left at bedside.    PICC Placement Documentation  PICC Double Lumen 05/24/24 Right Brachial 34 cm 0 cm (Active)  Indication for Insertion or Continuance of Line Limited venous access - need for IV therapy >5 days (PICC only) 05/24/24 1742  Exposed Catheter (cm) 0 cm 05/24/24 1742  Site Assessment Clean, Dry, Intact 05/24/24 1742  Lumen #1 Status Saline locked;Flushed;Blood return noted 05/24/24 1742  Lumen #2 Status Saline locked;Flushed;Blood return noted 05/24/24 1742  Dressing Type Transparent;Securing device 05/24/24 1742  Dressing Status Antimicrobial disc/dressing in place;Clean, Dry, Intact 05/24/24 1742  Line Care Connections checked and tightened 05/24/24 1742  Line Adjustment (NICU/IV Team Only) No 05/24/24 1742  Dressing Intervention New dressing;Adhesive placed at insertion site (IV team only) 05/24/24 1742  Dressing Change Due 05/31/24 05/24/24 1742    Consent signed by daughter, at bedside.   Matthias Merle Chenice 05/24/2024, 5:43 PM

## 2024-05-24 NOTE — Progress Notes (Signed)
 Pharmacy Antibiotic Note  Hannah [name] is a 89 y.o. female  with oropharangeal candidiasis .Pharmacy has been consulted for fluconazole  dosing.  -Wt 40kg -SCr 0.95, CrCl ~ 25  Plan: -Fluconazole  100mg  x1 then 50mg  po dailty -Total course 10 days -Will sign off. Please contact pharmacy with any other needs.   Thank you for allowing pharmacy to be a part of this patients care.  Prentice Poisson, PharmD Clinical Pharmacist **Pharmacist phone directory can now be found on [email or web address] ([email or web address]).  Listed under [facility].

## 2024-05-24 NOTE — Progress Notes (Addendum)
 " PROGRESS NOTE    Hannah [name]  FMW:[id number] DOB: [birth year] DOA: 05/17/2024 PCP: Sun, Vyvyan, MD  Chief Complaint  Patient presents with   Shortness of Breath   Altered Mental Status    Brief Narrative:   89 year old female with past medical history significant for HFpEF, hypertension, COPD, hypothyroidism, chronic kidney disease stage III and type 2 diabetes mellitus who presented with worsening shortness of breath and edema.   She's been diagnosed with influenza, MSSA pneumonia, and MSSA bacteremia.    She's been seen by ID.  Cardiology was c/s for SVT.    Assessment & Plan:   Principal Problem:   Sepsis due to pneumonia [facility]) Active Problems:   Acute hypoxic respiratory failure (HCC)   Influenza Hannah [name] with pneumonia   Chronic obstructive asthma (HCC)   Hypertension   Insulin  dependent type 2 diabetes mellitus (HCC)   CKD stage 3a, GFR 45-59 ml/min (HCC)   Iron  deficiency anemia   UTI (urinary tract infection)   Hypothyroidism  Acute hypoxic respiratory failure Influenza Hannah [name] with MSSA pneumonia MSSA bacteremia: CT 1/1 with ground glass nodular densities within tree in bud appearance primarily in the lingula and to Hannah [name] lesser  degree in the LLL 1/1 blood culture with staph aureus and abiotrophia defectiva (suspected contaminant, but follow follow up culture) 1/3 blood culture NGTDx4 Appreciate ID recommendations - continue ancef  while inpatient with goal of 7 days of IV abx and then transition to linezolid  600 mg PO BID with total of 4 weeks of treatment through 06/16/2024. Tamiflu  1/2-1/6 Appreciate ID recs - recommending continue cefazolin  while inpatient (goal at least 7 days IV abx) and transition to linezolid  600 mg PO bid with total of 4 weeks of treatment through 06/16/2024.   CT CAP 1/7 without acute findings in abdomen or pelvis, tree in bud opacities in lingula and ill defined ground glass/nodular opacities in the mid left upper lobe have decreased, but not resolved.    TTE without notable valvular abnormalities - no plan for TEE Significant leukocytosis in setting of steroid use.  Steroids d/c'd 1/7.  Possible UTI: No urine culture collected UA at presentation with +LE/nitrites - watch for UTI symptoms   Intermittent SVT: Short intermittent runs Metoprolol  increased to 37.5 mg BID 1/7 Appreciate cardiology assistance - they've signed off 1/7 (see note).  Replace lytes to maintain K>4 and mag >2.  AKI Aki from presentation has improved  Concern for Poor UOP Urinary Retention Was retaining 600 cc overnight, requiring I/O cath Stop oxybutynin  Continue bladder scans - if retaining Hannah [name] significant amount again, may warrant catheter placement  Concern for Possible Dysphagia Thrush SLP eval, appreciate assistance Continued impressive thrush despite 3 days nystatin , will transition to fluconazole  Patient has history of oral cancer.  HFpEF Echo 05/18/2024 revealed normal left ventricular ejection fraction, with grade 1 diastolic dysfunction. Appears euvolemic  Cardiology now signed of 1/7.   Will diurese as needed  LLE Edema LE US   6:53 PM Per US  tech, [facility], positive for DVT in LLE.  Pending report.  Heparin  started.    Left 2nd Toe Injury Unclear trauma, plain films ordered  Dementia: Delirium precautions Continue Aricept .   T2DM Steroid induced hyperglycemia Basal, bolus, SSI Stop steroids Monitor  Loose watery diarrhea: + c diff antigen, negative PCR.  C diff ruled out with ID assistance, appreciate assistance - suspected abx associated diarrhea   Hypokalemia Hypomagnesemia Hypophosphatemia Follow and replace as needed   Poor PO intake Severe Protein Calorie  Malnutrition RD Body mass index is 17.35 kg/m.  Concern regarding frequent blood draws.  Will place PICC.     DVT prophylaxis: heparin  Code Status: full Family Communication: none Disposition:   Status is: Inpatient Remains inpatient appropriate  because: need for continued inpatient care   Consultants:  Palliative care cardiology  Procedures:  Echo IMPRESSIONS     1. Left ventricular ejection fraction, by estimation, is 60 to 65%. The  left ventricle has normal function. The left ventricle has no regional  wall motion abnormalities. Left ventricular diastolic parameters are  consistent with Grade I diastolic  dysfunction (impaired relaxation).   2. Right ventricular systolic function is normal. The right ventricular  size is normal.   3. The mitral valve is normal in structure. Mild mitral valve  regurgitation. No evidence of mitral stenosis.   4. The aortic valve is tricuspid. Aortic valve regurgitation is not  visualized. No aortic stenosis is present.   5. The inferior vena cava is normal in size with greater than 50%  respiratory variability, suggesting right atrial pressure of 3 mmHg.   Antimicrobials:  Anti-infectives (From admission, onward)    Start     Dose/Rate Route Frequency Ordered Stop   05/24/24 0000  linezolid  (ZYVOX ) 600 MG tablet        600 mg Oral 2 times daily 05/22/24 1502 06/14/24 2359   05/21/24 1230  fluconazole  (DIFLUCAN ) tablet 50 mg  Status:  Discontinued        50 mg Oral Daily 05/21/24 1140 05/21/24 1432   05/18/24 1829  oseltamivir  (TAMIFLU ) capsule 30 mg        30 mg Oral Daily 05/18/24 1829 05/22/24 0941   05/18/24 1545  ceFAZolin  (ANCEF ) IVPB 2g/100 mL premix        2 g 200 mL/hr over 30 Minutes Intravenous Every 12 hours 05/18/24 1451     05/18/24 1415  azithromycin  (ZITHROMAX ) 500 mg in sodium chloride  0.9 % 250 mL IVPB  Status:  Discontinued        500 mg 250 mL/hr over 60 Minutes Intravenous  Once 05/18/24 1326 05/18/24 1332   05/18/24 1415  cefTRIAXone  (ROCEPHIN ) 1 g in sodium chloride  0.9 % 100 mL IVPB  Status:  Discontinued        1 g 200 mL/hr over 30 Minutes Intravenous Every 24 hours 05/18/24 1326 05/18/24 1334   05/17/24 2200  oseltamivir  (TAMIFLU ) capsule 30 mg  Status:   Discontinued        30 mg Oral 2 times daily 05/17/24 2025 05/18/24 1829   05/17/24 2100  ceFEPIme  (MAXIPIME ) 2 g in sodium chloride  0.9 % 100 mL IVPB  Status:  Discontinued        2 g 200 mL/hr over 30 Minutes Intravenous Every 24 hours 05/17/24 2057 05/18/24 1451   05/17/24 2100  vancomycin  (VANCOCIN ) IVPB 1000 mg/200 mL premix        1,000 mg 200 mL/hr over 60 Minutes Intravenous  Once 05/17/24 2057 05/17/24 2324   05/17/24 2056  vancomycin  variable dose per unstable renal function (pharmacist dosing)  Status:  Discontinued         Does not apply See admin instructions 05/17/24 2057 05/18/24 1451   05/17/24 1900  cefTRIAXone  (ROCEPHIN ) 1 g in sodium chloride  0.9 % 100 mL IVPB        1 g 200 mL/hr over 30 Minutes Intravenous  Once 05/17/24 1849 05/17/24 1940   05/17/24 1900  azithromycin  (ZITHROMAX ) 500 mg in sodium  chloride 0.9 % 250 mL IVPB        500 mg 250 mL/hr over 60 Minutes Intravenous  Once 05/17/24 1849 05/17/24 2115       Subjective:  Daughter at bedside Concerns regarding UOP, PO intake, thrush, L 2nd toe.  Discussed plan for LLE US , PICC.  Transition to fluconazole  for thrush.  Objective: Vitals:   05/24/24 0415 05/24/24 0807 05/24/24 0807 05/24/24 1005  BP:   105/66 116/67  Pulse:   95 100  Resp:    20  Temp:   (!) 96.5 F (35.8 C) 97.6 F (36.4 C)  TempSrc:    Axillary  SpO2:  99% 100% 98%  Weight: 40.3 kg     Height:        Intake/Output Summary (Last 24 hours) at 05/24/2024 1041 Last data filed at 05/24/2024 0855 Gross per 24 hour  Intake 78 ml  Output 650 ml  Net -572 ml   Filed Weights   05/22/24 0340 05/23/24 0500 05/24/24 0415  Weight: 40.5 kg 40.3 kg 40.3 kg    Examination:  General: No acute distress. Cardiovascular: RRR Lungs: unlabored on 1-2 L Abdomen: distended, nontender Neurological: alert.  Moving all extremities. Extremities: L second toe pain, blood underneath toenail   Data Reviewed: I have personally reviewed following  labs and imaging studies  CBC: Recent Labs  Lab 05/19/24 0904 05/20/24 0215 05/21/24 0228 05/23/24 0228 05/24/24 0853  WBC 10.3 16.9* 16.0* 20.9* 27.3*  NEUTROABS  --   --  12.3*  --  19.0*  HGB 12.0 10.3* 10.2* 9.8* 10.4*  HCT 36.5 31.0* 30.9* 29.7* 31.3*  MCV 89.9 88.8 87.8 88.1 88.2  PLT 260 265 290 311 340    Basic Metabolic Panel: Recent Labs  Lab 05/18/24 1641 05/19/24 0904 05/20/24 0215 05/21/24 0228 05/22/24 0926 05/23/24 0228 05/23/24 0233 05/24/24 0853  NA  --    < > 139 139 139  --  140 134*  K  --    < > 3.6 3.1* 3.9  --  4.3 4.9  CL  --    < > 103 101 101  --  105 101  CO2  --    < > 26 26 23   --  24 24  GLUCOSE  --    < > 166* 192* 316*  --  145* 168*  BUN  --    < > 15 18 18   --  19 18  CREATININE  --    < > 0.98 1.04* 1.06*  --  0.92 0.95  CALCIUM   --    < > 8.1* 8.2* 8.1*  --  8.0* 8.3*  MG 1.6*  --   --  1.8 2.3 2.2  --   --   PHOS 2.7  --   --  1.4* 2.6  --  2.2*  --    < > = values in this interval not displayed.    GFR: Estimated Creatinine Clearance: 25.5 mL/min (by C-G formula based on SCr of 0.95 mg/dL).  Liver Function Tests: Recent Labs  Lab 05/18/24 0315 05/21/24 0228 05/22/24 0926 05/23/24 0233 05/24/24 0853  AST 47*  --   --   --  45*  ALT 31  --   --   --  18  ALKPHOS 49  --   --   --  46  BILITOT <0.2  --   --   --  0.3  PROT 5.5*  --   --   --  5.4*  ALBUMIN 3.2* 3.2* 3.3* 3.1* 3.2*    CBG: Recent Labs  Lab 05/23/24 0802 05/23/24 1232 05/23/24 1740 05/23/24 2041 05/24/24 0814  GLUCAP 181* 219* 224* 172* 151*     Recent Results (from the past 240 hours)  Resp panel by RT-PCR (RSV, Flu Allayna Erlich&B, Covid) Anterior Nasal Swab     Status: Abnormal   Collection Time: 05/17/24  5:55 PM   Specimen: Anterior Nasal Swab  Result Value Ref Range Status   SARS Coronavirus 2 by RT PCR NEGATIVE NEGATIVE Final    Comment: (NOTE) SARS-CoV-2 target nucleic acids are NOT DETECTED.  The SARS-CoV-2 RNA is generally detectable in  upper respiratory specimens during the acute phase of infection. The lowest concentration of SARS-CoV-2 viral copies this assay can detect is 138 copies/mL. Esequiel Kleinfelter negative result does not preclude SARS-Cov-2 infection and should not be used as the sole basis for treatment or other patient management decisions. Cece Milhouse negative result may occur with  improper specimen collection/handling, submission of specimen other than nasopharyngeal swab, presence of viral mutation(s) within the areas targeted by this assay, and inadequate number of viral copies(<138 copies/mL). Havah Ammon negative result must be combined with clinical observations, patient history, and epidemiological information. The expected result is Negative.  Fact Sheet for Patients:  bloggercourse.com  Fact Sheet for Healthcare Providers:  seriousbroker.it  This test is no t yet approved or cleared by the [place]  FDA and  has been authorized for detection and/or diagnosis of SARS-CoV-2 by FDA under an Emergency Use Authorization (EUA). This EUA will remain  in effect (meaning this test can be used) for the duration of the COVID-19 declaration under Section 564(b)(1) of the Act, 21 U.S.C.section 360bbb-3(b)(1), unless the authorization is terminated  or revoked sooner.       Influenza Margarito Dehaas by PCR POSITIVE (Tudor Chandley) NEGATIVE Final   Influenza B by PCR NEGATIVE NEGATIVE Final    Comment: (NOTE) The Xpert Xpress SARS-CoV-2/FLU/RSV plus assay is intended as an aid in the diagnosis of influenza from Nasopharyngeal swab specimens and should not be used as Iraida Cragin sole basis for treatment. Nasal washings and aspirates are unacceptable for Xpert Xpress SARS-CoV-2/FLU/RSV testing.  Fact Sheet for Patients: bloggercourse.com  Fact Sheet for Healthcare Providers: seriousbroker.it  This test is not yet approved or cleared by the [place]  FDA and has  been authorized for detection and/or diagnosis of SARS-CoV-2 by FDA under an Emergency Use Authorization (EUA). This EUA will remain in effect (meaning this test can be used) for the duration of the COVID-19 declaration under Section 564(b)(1) of the Act, 21 U.S.C. section 360bbb-3(b)(1), unless the authorization is terminated or revoked.     Resp Syncytial Virus by PCR NEGATIVE NEGATIVE Final    Comment: (NOTE) Fact Sheet for Patients: bloggercourse.com  Fact Sheet for Healthcare Providers: seriousbroker.it  This test is not yet approved or cleared by the [place]  FDA and has been authorized for detection and/or diagnosis of SARS-CoV-2 by FDA under an Emergency Use Authorization (EUA). This EUA will remain in effect (meaning this test can be used) for the duration of the COVID-19 declaration under Section 564(b)(1) of the Act, 21 U.S.C. section 360bbb-3(b)(1), unless the authorization is terminated or revoked.  Performed at [facility], [address], [place], [place] [address]   Culture, blood (single)     Status: Abnormal   Collection Time: 05/17/24  7:15 PM   Specimen: BLOOD LEFT FOREARM  Result Value Ref Range Status   Specimen Description  Final    BLOOD LEFT FOREARM Performed at [facility], [address] [place], [place] [address]    Special Requests   Final    BOTTLES DRAWN AEROBIC AND ANAEROBIC Blood Culture adequate volume Performed at [facility], [address], [place], [place] [address]    Culture  Setup Time   Final    GRAM POSITIVE COCCI IN CLUSTERS AEROBIC BOTTLE ONLY CRITICAL RESULT CALLED TO, READ BACK BY AND VERIFIED WITH: PHARMD JEREMY F [id number] FCP GRAM POSITIVE COCCI IN CHAINS ANAEROBIC BOTTLE ONLY RBV JESICCA CARNIE PHARMD 05/19/2024 BY DD @ 0134    Culture (Matthieu Loftus)  Final    STAPHYLOCOCCUS AUREUS ABIOTROPHIA DEFECTIVA Standardized  susceptibility testing for this organism is not available. Performed at [facility], [address], [place], [place] [address]    Report Status 05/20/2024 FINAL  Final   Organism ID, Bacteria STAPHYLOCOCCUS AUREUS  Final      Susceptibility   Staphylococcus aureus - MIC*    CIPROFLOXACIN  >=8 RESISTANT Resistant     ERYTHROMYCIN <=0.25 SENSITIVE Sensitive     GENTAMICIN <=0.5 SENSITIVE Sensitive     OXACILLIN 0.5 SENSITIVE Sensitive     TETRACYCLINE <=1 SENSITIVE Sensitive     VANCOMYCIN  1 SENSITIVE Sensitive     TRIMETH/SULFA <=10 SENSITIVE Sensitive     CLINDAMYCIN <=0.25 SENSITIVE Sensitive     RIFAMPIN <=0.5 SENSITIVE Sensitive     Inducible Clindamycin NEGATIVE Sensitive     LINEZOLID  2 SENSITIVE Sensitive     * STAPHYLOCOCCUS AUREUS  Blood Culture ID Panel (Reflexed)     Status: Abnormal   Collection Time: 05/17/24  7:15 PM  Result Value Ref Range Status   Enterococcus faecalis NOT DETECTED NOT DETECTED Final   Enterococcus Faecium NOT DETECTED NOT DETECTED Final   Listeria monocytogenes NOT DETECTED NOT DETECTED Final   Staphylococcus species DETECTED (Trew Sunde) NOT DETECTED Final    Comment: CRITICAL RESULT CALLED TO, READ BACK BY AND VERIFIED WITH: PHARMD JEREMY F [id number] FCP    Staphylococcus aureus (BCID) DETECTED (Sharece Fleischhacker) NOT DETECTED Final    Comment: CRITICAL RESULT CALLED TO, READ BACK BY AND VERIFIED WITH: PHARMD JEREMY F [id number] [id number] FCP    Staphylococcus epidermidis NOT DETECTED NOT DETECTED Final   Staphylococcus lugdunensis NOT DETECTED NOT DETECTED Final   Streptococcus species NOT DETECTED NOT DETECTED Final   Streptococcus agalactiae NOT DETECTED NOT DETECTED Final   Streptococcus pneumoniae NOT DETECTED NOT DETECTED Final   Streptococcus pyogenes NOT DETECTED NOT DETECTED Final   Arman Loy.calcoaceticus-baumannii NOT DETECTED NOT DETECTED Final   Bacteroides fragilis NOT DETECTED NOT DETECTED Final   Enterobacterales NOT DETECTED NOT DETECTED Final    Enterobacter cloacae complex NOT DETECTED NOT DETECTED Final   Escherichia coli NOT DETECTED NOT DETECTED Final   Klebsiella aerogenes NOT DETECTED NOT DETECTED Final   Klebsiella oxytoca NOT DETECTED NOT DETECTED Final   Klebsiella pneumoniae NOT DETECTED NOT DETECTED Final   Proteus species NOT DETECTED NOT DETECTED Final   Salmonella species NOT DETECTED NOT DETECTED Final   Serratia marcescens NOT DETECTED NOT DETECTED Final   Haemophilus influenzae NOT DETECTED NOT DETECTED Final   Neisseria meningitidis NOT DETECTED NOT DETECTED Final   Pseudomonas aeruginosa NOT DETECTED NOT DETECTED Final   Stenotrophomonas maltophilia NOT DETECTED NOT DETECTED Final   Candida albicans NOT DETECTED NOT DETECTED Final   Candida auris NOT DETECTED NOT DETECTED Final   Candida glabrata NOT DETECTED NOT DETECTED Final   Candida  krusei NOT DETECTED NOT DETECTED Final   Candida parapsilosis NOT DETECTED NOT DETECTED Final   Candida tropicalis NOT DETECTED NOT DETECTED Final   Cryptococcus neoformans/gattii NOT DETECTED NOT DETECTED Final   Meth resistant mecA/C and MREJ NOT DETECTED NOT DETECTED Final    Comment: Performed at [facility], [address], [place], [place] [address]  Blood Culture ID Panel (Reflexed)     Status: None   Collection Time: 05/18/24  7:15 PM  Result Value Ref Range Status   Enterococcus faecalis NOT DETECTED NOT DETECTED Final   Enterococcus Faecium NOT DETECTED NOT DETECTED Final   Listeria monocytogenes NOT DETECTED NOT DETECTED Final   Staphylococcus species NOT DETECTED NOT DETECTED Final   Staphylococcus aureus (BCID) NOT DETECTED NOT DETECTED Final   Staphylococcus epidermidis NOT DETECTED NOT DETECTED Final   Staphylococcus lugdunensis NOT DETECTED NOT DETECTED Final   Streptococcus species NOT DETECTED NOT DETECTED Final   Streptococcus agalactiae NOT DETECTED NOT DETECTED Final   Streptococcus pneumoniae NOT DETECTED NOT DETECTED Final   Streptococcus  pyogenes NOT DETECTED NOT DETECTED Final   Tywaun Hiltner.calcoaceticus-baumannii NOT DETECTED NOT DETECTED Final   Bacteroides fragilis NOT DETECTED NOT DETECTED Final   Enterobacterales NOT DETECTED NOT DETECTED Final   Enterobacter cloacae complex NOT DETECTED NOT DETECTED Final   Escherichia coli NOT DETECTED NOT DETECTED Final   Klebsiella aerogenes NOT DETECTED NOT DETECTED Final   Klebsiella oxytoca NOT DETECTED NOT DETECTED Final   Klebsiella pneumoniae NOT DETECTED NOT DETECTED Final   Proteus species NOT DETECTED NOT DETECTED Final   Salmonella species NOT DETECTED NOT DETECTED Final   Serratia marcescens NOT DETECTED NOT DETECTED Final   Haemophilus influenzae NOT DETECTED NOT DETECTED Final   Neisseria meningitidis NOT DETECTED NOT DETECTED Final   Pseudomonas aeruginosa NOT DETECTED NOT DETECTED Final   Stenotrophomonas maltophilia NOT DETECTED NOT DETECTED Final   Candida albicans NOT DETECTED NOT DETECTED Final   Candida auris NOT DETECTED NOT DETECTED Final   Candida glabrata NOT DETECTED NOT DETECTED Final   Candida krusei NOT DETECTED NOT DETECTED Final   Candida parapsilosis NOT DETECTED NOT DETECTED Final   Candida tropicalis NOT DETECTED NOT DETECTED Final   Cryptococcus neoformans/gattii NOT DETECTED NOT DETECTED Final    Comment: Performed at [facility], [address] [place], [place] [address]  Culture, blood (Routine X 2) w Reflex to ID Panel     Status: None   Collection Time: 05/19/24  9:04 AM   Specimen: BLOOD LEFT ARM  Result Value Ref Range Status   Specimen Description BLOOD LEFT ARM  Final   Special Requests   Final    BOTTLES DRAWN AEROBIC AND ANAEROBIC Blood Culture adequate volume   Culture   Final    NO GROWTH 5 DAYS Performed at [facility], [address], [place], [place] [address]    Report Status 05/24/2024 FINAL  Final  Culture, blood (Routine X 2) w Reflex to ID Panel     Status: None   Collection Time: 05/19/24  9:12 AM   Specimen:  BLOOD LEFT HAND  Result Value Ref Range Status   Specimen Description BLOOD LEFT HAND  Final   Special Requests   Final    BOTTLES DRAWN AEROBIC AND ANAEROBIC Blood Culture results may not be optimal due to an inadequate volume of blood received in culture bottles   Culture   Final    NO GROWTH 5 DAYS Performed at [facility], [address], [place], [place] [address]    Report Status 05/24/2024 FINAL  Final  C Difficile Quick Screen w PCR reflex     Status: Abnormal   Collection Time: 05/19/24  8:29 PM   Specimen: STOOL  Result Value Ref Range Status   C Diff antigen POSITIVE (Ziaire Bieser) NEGATIVE Final   C Diff toxin NEGATIVE NEGATIVE Final   C Diff interpretation Results are indeterminate. See PCR results.  Final    Comment: Performed at [facility], [address], [place], [place] [address]  C. Diff by PCR, Reflexed     Status: None   Collection Time: 05/19/24  8:29 PM  Result Value Ref Range Status   Toxigenic C. Difficile by PCR NEGATIVE NEGATIVE Final    Comment: Patient is colonized with non toxigenic C. difficile. May not need treatment unless significant symptoms are present.   Hypervirulent Strain PRESUMPTIVE NEGATIVE PRESUMPTIVE NEGATIVE Final    Comment: Performed at [facility], [address] [place], [place] [address]  Gastrointestinal Panel by PCR , Stool     Status: None   Collection Time: 05/20/24 12:10 AM   Specimen: Stool  Result Value Ref Range Status   Campylobacter species NOT DETECTED NOT DETECTED Final   Plesimonas shigelloides NOT DETECTED NOT DETECTED Final   Salmonella species NOT DETECTED NOT DETECTED Final   Yersinia enterocolitica NOT DETECTED NOT DETECTED Final   Vibrio species NOT DETECTED NOT DETECTED Final   Vibrio cholerae NOT DETECTED NOT DETECTED Final   Enteroaggregative E coli (EAEC) NOT DETECTED NOT DETECTED Final   Enteropathogenic E coli (EPEC) NOT DETECTED NOT DETECTED Final   Enterotoxigenic E coli (ETEC) NOT DETECTED  NOT DETECTED Final   Shiga like toxin producing E coli (STEC) NOT DETECTED NOT DETECTED Final   Shigella/Enteroinvasive E coli (EIEC) NOT DETECTED NOT DETECTED Final   Cryptosporidium NOT DETECTED NOT DETECTED Final   Cyclospora cayetanensis NOT DETECTED NOT DETECTED Final   Entamoeba histolytica NOT DETECTED NOT DETECTED Final   Giardia lamblia NOT DETECTED NOT DETECTED Final   Adenovirus F40/41 NOT DETECTED NOT DETECTED Final   Astrovirus NOT DETECTED NOT DETECTED Final   Norovirus GI/GII NOT DETECTED NOT DETECTED Final   Rotavirus Lliam Hoh NOT DETECTED NOT DETECTED Final   Sapovirus (I, II, IV, and V) NOT DETECTED NOT DETECTED Final    Comment: Performed at [facility], [address] [place], [place] [address]         Radiology Studies: US  EKG SITE RITE Result Date: 05/24/2024 If Site Rite image not attached, placement could not be confirmed due to current cardiac rhythm.  CT CHEST ABDOMEN PELVIS WO CONTRAST Result Date: 05/23/2024 EXAM: CT CHEST, ABDOMEN AND PELVIS WITHOUT CONTRAST 05/23/2024 05:09:22 PM TECHNIQUE: CT of the chest, abdomen and pelvis was performed without the administration of intravenous contrast. Multiplanar reformatted images are provided for review. Automated exposure control, iterative reconstruction, and/or weight based adjustment of the mA/kV was utilized to reduce the radiation dose to as low as reasonably achievable. COMPARISON: Chest CT 05/17/2024. CT angiogram abdomen and pelvis 11/29/2023. CLINICAL HISTORY: Pneumonia, complication suspected, xray done; abdomen pain. FINDINGS: CHEST: MEDIASTINUM AND LYMPH NODES: Heart and pericardium are unremarkable. Coronary and aortic atherosclerotic calcifications are noted. The central airways are clear. No mediastinal, hilar or axillary lymphadenopathy. LUNGS AND PLEURA: Stable scarring in the right lung apex. Tree in bud opacities in the lingula and ill defined ground glass/nodular opacities in the mid left upper  lobe have mildly decreased, but have not completely resolved. Linear areas of scarring in the inferior right upper lobe persist. No new focal lung infiltrate. No pleural effusion. No pneumothorax. ABDOMEN AND PELVIS: LIVER: Unremarkable. GALLBLADDER AND BILE DUCTS: Small gallstone is present. No biliary ductal dilatation. SPLEEN: No acute abnormality. PANCREAS: No acute abnormality. ADRENAL GLANDS: No acute abnormality. KIDNEYS, URETERS AND BLADDER: No stones in the kidneys or ureters. No hydronephrosis. No perinephric or periureteral stranding. Urinary bladder is unremarkable. GI AND BOWEL: Stomach demonstrates no acute abnormality. There is Briauna Gilmartin large amount of stool throughout the colon. There is sigmoid colon diverticulosis. The appendix is not visualized. There is no bowel obstruction. REPRODUCTIVE ORGANS: No acute abnormality. PERITONEUM AND RETROPERITONEUM: No ascites. No free air. VASCULATURE: Aorta is normal in caliber. There are atherosclerotic calcifications of the aorta. ABDOMINAL AND PELVIS LYMPH NODES: No lymphadenopathy. BONES AND SOFT TISSUES: The bones are diffusely osteopenic. Changes are seen throughout the lumbar spine. Mild subcutaneous edema lateral to both hips. Tiny focus of subcutaneous air in the lower anterior abdominal wall may represent medication injection site. IMPRESSION: 1. Tree-in-bud opacities in the lingula and ill-defined ground glass/nodular opacities in the mid left upper lobe have mildly decreased but have not completely resolved, with no new focal lung infiltrate. 2. No acute findings in abdomen or pelvis. 3. Sigmoid colon diverticulosis without evidence of diverticulitis. Electronically signed by: Greig Pique MD MD 05/23/2024 07:43 PM EST RP Workstation: [id number]   DG Abd 1 View Result Date: 05/22/2024 EXAM: 1 VIEW XRAY OF THE ABDOMEN 05/22/2024 11:26:00 AM COMPARISON: CT abdomen and pelvis 11/29/2023. CLINICAL HISTORY: Abdominal distention. FINDINGS: BOWEL: Gas and stool  throughout the colon. No small or large bowel distention. This indicates Milagro Belmares nonobstructive bowel gas pattern. SOFT TISSUES: No radiopaque stones. Soft tissue contours appear intact. BONES: Degenerative changes in the spine and hips. No acute fracture. LUNG BASES: Lung bases are clear. IMPRESSION: 1. Nonobstructive bowel gas pattern with gas and stool throughout the colon. Electronically signed by: Elsie Gravely MD 05/22/2024 08:21 PM EST RP Workstation: [id number]        Scheduled Meds:  amLODipine   2.5 mg Oral Daily   ascorbic acid   500 mg Oral Daily   budesonide   0.5 mg Nebulization BID   chlorpheniramine-HYDROcodone   5 mL Oral Q12H   donepezil   5 mg Oral QHS   ezetimibe   10 mg Oral QHS   And   simvastatin   20 mg Oral QHS   feeding supplement  237 mL Oral BID BM   guaiFENesin   10 mL Oral Q8H   heparin   5,000 Units Subcutaneous Q8H   insulin  aspart  0-20 Units Subcutaneous TID WC   insulin  aspart  0-5 Units Subcutaneous QHS   insulin  aspart  5 Units Subcutaneous TID WC   insulin  glargine-yfgn  30 Units Subcutaneous QHS   ipratropium-albuterol   3 mL Nebulization Q6H   lactobacillus  1 g Oral TID WC   latanoprost   1 drop Both Eyes QHS   levothyroxine   50 mcg Oral See admin instructions   magnesium  oxide  400 mg Oral Daily   megestrol   600 mg Oral QAC supper   metoprolol  tartrate  37.5 mg Oral BID   multivitamin with minerals  1 tablet Oral Daily   oxybutynin   10 mg Oral Daily   pantoprazole   40 mg Oral Daily   polyethylene glycol  17 g Oral BID   sodium chloride  flush  3 mL Intravenous Q12H   sodium chloride  flush  3  mL Intravenous Q12H   thiamine   100 mg Oral Daily   zinc  sulfate (50mg  elemental zinc )  220 mg Oral Daily   Continuous Infusions:   ceFAZolin  (ANCEF ) IV 2 g (05/24/24 0414)     LOS: 6 days    Time spent: over 30 min    Meliton Monte, MD [facility]   To contact the attending provider between 7A-7P or the covering provider during after hours  7P-7A, please log into the web site [email or web address] and access using universal [place] password for that web site. If you do not have the password, please call the hospital operator.  05/24/2024, 10:41 AM    "

## 2024-05-24 NOTE — Progress Notes (Signed)
 "  Progress Note  Patient Name: Hannah [name] Date of Encounter: 05/24/2024  Primary Cardiologist: Gordy Bergamo, MD   Subjective   Patient seen and examined at her bedside. Signed off yesterday but daughter requested we see again today   Inpatient Medications    Scheduled Meds:  amLODipine   2.5 mg Oral Daily   ascorbic acid   500 mg Oral Daily   budesonide   0.5 mg Nebulization BID   chlorpheniramine-HYDROcodone   5 mL Oral Q12H   donepezil   5 mg Oral QHS   ezetimibe   10 mg Oral QHS   And   simvastatin   20 mg Oral QHS   feeding supplement  237 mL Oral BID BM   fluconazole   100 mg Oral Once   Followed by   [START ON 05/25/2024] fluconazole   50 mg Oral Daily   guaiFENesin   10 mL Oral Q8H   heparin   5,000 Units Subcutaneous Q8H   insulin  aspart  0-20 Units Subcutaneous TID WC   insulin  aspart  0-5 Units Subcutaneous QHS   insulin  aspart  5 Units Subcutaneous TID WC   insulin  glargine-yfgn  30 Units Subcutaneous QHS   ipratropium-albuterol   3 mL Nebulization Q6H   lactobacillus  1 g Oral TID WC   latanoprost   1 drop Both Eyes QHS   levothyroxine   50 mcg Oral See admin instructions   magnesium  oxide  400 mg Oral Daily   metoprolol  tartrate  37.5 mg Oral BID   multivitamin with minerals  1 tablet Oral Daily   pantoprazole   40 mg Oral Daily   polyethylene glycol  17 g Oral BID   sodium chloride  flush  3 mL Intravenous Q12H   sodium chloride  flush  3 mL Intravenous Q12H   thiamine   100 mg Oral Daily   zinc  sulfate (50mg  elemental zinc )  220 mg Oral Daily   Continuous Infusions:   ceFAZolin  (ANCEF ) IV 2 g (05/24/24 0414)   PRN Meds: acetaminophen  **OR** acetaminophen , albuterol , HYDROmorphone  (DILAUDID ) injection, ondansetron  **OR** ondansetron  (ZOFRAN ) IV, oxyCODONE , senna-docusate, traZODone    Vital Signs    Vitals:   05/24/24 0415 05/24/24 0807 05/24/24 0807 05/24/24 1005  BP:   105/66 116/67  Pulse:   95 100  Resp:    20  Temp:   (!) 96.5 F (35.8 C) 97.6 F (36.4 C)   TempSrc:    Axillary  SpO2:  99% 100% 98%  Weight: 40.3 kg     Height:        Intake/Output Summary (Last 24 hours) at 05/24/2024 1120 Last data filed at 05/24/2024 0855 Gross per 24 hour  Intake 78 ml  Output 650 ml  Net -572 ml   Filed Weights   05/22/24 0340 05/23/24 0500 05/24/24 0415  Weight: 40.5 kg 40.3 kg 40.3 kg    Telemetry     Sinus rhythm short runs of svt - Personally Reviewed  ECG     - Personally Reviewed  Physical Exam    General: Comfortable Head: Atraumatic, normal size  Eyes: PEERLA, EOMI  Neck: Supple, normal JVD Cardiac: Normal S1, S2; RRR; no murmurs, rubs, or gallops Lungs: Clear to auscultation bilaterally Abd: Soft, nontender, no hepatomegaly  Ext: warm, no edema Musculoskeletal: No deformities, BUE and BLE strength normal and equal Skin: Warm and dry, no rashes   Neuro: Alert and oriented to person, place, time, and situation, CNII-XII grossly intact, no focal deficits  Psych: Normal mood and affect   Labs    Chemistry Recent Labs  Lab 05/18/24 0315 05/19/24 0904 05/22/24 0926 05/23/24 0233 05/24/24 0853  NA 140   < > 139 140 134*  K 4.3   < > 3.9 4.3 4.9  CL 102   < > 101 105 101  CO2 25   < > 23 24 24   GLUCOSE 291*   < > 316* 145* 168*  BUN 22   < > 18 19 18   CREATININE 1.26*   < > 1.06* 0.92 0.95  CALCIUM  8.8*   < > 8.1* 8.0* 8.3*  PROT 5.5*  --   --   --  5.4*  ALBUMIN 3.2*   < > 3.3* 3.1* 3.2*  AST 47*  --   --   --  45*  ALT 31  --   --   --  18  ALKPHOS 49  --   --   --  46  BILITOT <0.2  --   --   --  0.3  GFRNONAA 41*   < > 50* 59* 57*  ANIONGAP 13   < > 14 11 10    < > = values in this interval not displayed.     Hematology Recent Labs  Lab 05/21/24 0228 05/23/24 0228 05/24/24 0853  WBC 16.0* 20.9* 27.3*  RBC 3.52* 3.37* 3.55*  HGB 10.2* 9.8* 10.4*  HCT 30.9* 29.7* 31.3*  MCV 87.8 88.1 88.2  MCH 29.0 29.1 29.3  MCHC 33.0 33.0 33.2  RDW 16.9* 17.3* 17.2*  PLT 290 311 340    Cardiac EnzymesNo results  for input(s): TROPONINI in the last 168 hours. No results for input(s): TROPIPOC in the last 168 hours.   BNP Recent Labs  Lab 05/17/24 1739 05/18/24 1641 05/20/24 0215  PROBNP 453.0* 644.0* 1,916.0*     DDimer No results for input(s): DDIMER in the last 168 hours.   Radiology    US  EKG SITE RITE Result Date: 05/24/2024 If Site Rite image not attached, placement could not be confirmed due to current cardiac rhythm.  CT CHEST ABDOMEN PELVIS WO CONTRAST Result Date: 05/23/2024 EXAM: CT CHEST, ABDOMEN AND PELVIS WITHOUT CONTRAST 05/23/2024 05:09:22 PM TECHNIQUE: CT of the chest, abdomen and pelvis was performed without the administration of intravenous contrast. Multiplanar reformatted images are provided for review. Automated exposure control, iterative reconstruction, and/or weight based adjustment of the mA/kV was utilized to reduce the radiation dose to as low as reasonably achievable. COMPARISON: Chest CT 05/17/2024. CT angiogram abdomen and pelvis 11/29/2023. CLINICAL HISTORY: Pneumonia, complication suspected, xray done; abdomen pain. FINDINGS: CHEST: MEDIASTINUM AND LYMPH NODES: Heart and pericardium are unremarkable. Coronary and aortic atherosclerotic calcifications are noted. The central airways are clear. No mediastinal, hilar or axillary lymphadenopathy. LUNGS AND PLEURA: Stable scarring in the right lung apex. Tree in bud opacities in the lingula and ill defined ground glass/nodular opacities in the mid left upper lobe have mildly decreased, but have not completely resolved. Linear areas of scarring in the inferior right upper lobe persist. No new focal lung infiltrate. No pleural effusion. No pneumothorax. ABDOMEN AND PELVIS: LIVER: Unremarkable. GALLBLADDER AND BILE DUCTS: Small gallstone is present. No biliary ductal dilatation. SPLEEN: No acute abnormality. PANCREAS: No acute abnormality. ADRENAL GLANDS: No acute abnormality. KIDNEYS, URETERS AND BLADDER: No stones in the  kidneys or ureters. No hydronephrosis. No perinephric or periureteral stranding. Urinary bladder is unremarkable. GI AND BOWEL: Stomach demonstrates no acute abnormality. There is a large amount of stool throughout the colon. There is sigmoid colon diverticulosis. The appendix is not visualized.  There is no bowel obstruction. REPRODUCTIVE ORGANS: No acute abnormality. PERITONEUM AND RETROPERITONEUM: No ascites. No free air. VASCULATURE: Aorta is normal in caliber. There are atherosclerotic calcifications of the aorta. ABDOMINAL AND PELVIS LYMPH NODES: No lymphadenopathy. BONES AND SOFT TISSUES: The bones are diffusely osteopenic. Changes are seen throughout the lumbar spine. Mild subcutaneous edema lateral to both hips. Tiny focus of subcutaneous air in the lower anterior abdominal wall may represent medication injection site. IMPRESSION: 1. Tree-in-bud opacities in the lingula and ill-defined ground glass/nodular opacities in the mid left upper lobe have mildly decreased but have not completely resolved, with no new focal lung infiltrate. 2. No acute findings in abdomen or pelvis. 3. Sigmoid colon diverticulosis without evidence of diverticulitis. Electronically signed by: Greig Pique MD MD 05/23/2024 07:43 PM EST RP Workstation: [id number]   DG Abd 1 View Result Date: 05/22/2024 EXAM: 1 VIEW XRAY OF THE ABDOMEN 05/22/2024 11:26:00 AM COMPARISON: CT abdomen and pelvis 11/29/2023. CLINICAL HISTORY: Abdominal distention. FINDINGS: BOWEL: Gas and stool throughout the colon. No small or large bowel distention. This indicates a nonobstructive bowel gas pattern. SOFT TISSUES: No radiopaque stones. Soft tissue contours appear intact. BONES: Degenerative changes in the spine and hips. No acute fracture. LUNG BASES: Lung bases are clear. IMPRESSION: 1. Nonobstructive bowel gas pattern with gas and stool throughout the colon. Electronically signed by: Elsie Gravely MD 05/22/2024 08:21 PM EST RP Workstation: [id number]     Cardiac Studies   Echo   Patient Profile     89 y.o. female  hx of HFpEF, HTN, asthma, COPD, mild-moderate MR, hypothyroidism, insulin -dependent type 2 DM, CKD stage IIIa   Assessment & Plan    Chronic diastolic heart failure Mild MR  Atrial tachycardia Sepsis   Clinically she does not appear to be volume overloaded.  We will reassess clinically for need for duiretics daily.   Continue current dose of AV nodal blockers, short run of SVTovernight. Will stop lopressor  and use Toprol  xl 25 mg BID  Please replete her electrolytes. Keep K>4 and Mag > 2  Abx per primary team   We will sign off at this time. Please call with questions    For questions or updates, please contact [facility] Please consult [email or web address] for contact info under Cardiology/STEMI.      Signed, Alexandrya Chim, DO  05/24/2024, 11:20 AM    "

## 2024-05-24 NOTE — Procedures (Signed)
 Objective Swallowing Evaluation: Type of Study: FEES-Fiberoptic Endoscopic Evaluation of Swallow   Patient Details  Name: Hannah [name] MRN: [id number] Date of Birth: [birth year]  Today's Date: 05/24/2024 Time: SLP Start Time (ACUTE ONLY): 1150 -SLP Stop Time (ACUTE ONLY): 1120  SLP Time Calculation (min) (ACUTE ONLY): 1410 min   Past Medical History:  Past Medical History:  Diagnosis Date   COVID-19    Depression    Diabetes mellitus    Hypercholesteremia    Hypertension    Hypothyroidism    Oral cancer (HCC) 2012   surgery and radiation   Spinal stenosis    Tuberculosis    1978   Weight loss, unintentional    Past Surgical History:  Past Surgical History:  Procedure Laterality Date   AORTIC ARCH ANGIOGRAPHY  09/2019   BACK SURGERY     COLONOSCOPY     EYE SURGERY Bilateral    cataracts   LIPOMA EXCISION     back   LUMBAR LAMINECTOMY/DECOMPRESSION MICRODISCECTOMY Right 04/12/2018   Procedure: Laminectomy and Foraminotomy - Lumbar four-Lumbar five - Lumbar five-Sacral one - right;  Surgeon: Joshua Alm RAMAN, MD;  Location: [facility] OR;  Service: Neurosurgery;  Laterality: Right;   LUMBAR SPINE SURGERY  05/2019   MASS EXCISION Left 04/02/2019   Procedure: EXCISION OF LIPOMATOUS MASS OF THE LEFT BACK;  Surgeon: Gladis Cough, MD;  Location: [facility];  Service: General;  Laterality: Left;   OTHER SURGICAL HISTORY     squamous cell removal;skin graft   RIGHT/LEFT HEART CATH AND CORONARY ANGIOGRAPHY N/A 09/18/2019   Procedure: RIGHT/LEFT HEART CATH AND CORONARY ANGIOGRAPHY;  Surgeon: Ladona Heinz, MD;  Location: [facility];  Service: Cardiovascular;  Laterality: N/A;   TOOTH EXTRACTION     all pulled   HPI: 89yo female admitted from home 05/17/24 with SOB, AMS, cough, fatigue, BLE swelling, and generalized weakness. FLU-A + CTChest: Tree-in-bud opacities in the lingula, ill-defined ground glass/nodular opacities in mid LUL have mildly decreased but have not  completely resolved, no new focal lung infiltrate. FEES 06/2017: flash pen thin, reg/thin. PMH: CHF, HFpEF, HTN, COPD, hypothyroidism, DM2, CKD3a.   Subjective: Pt participatory. Daughter present    Assessment / Plan / Recommendation     05/24/2024   11:31 AM  Clinical Impressions  Clinical Impression Pt presents with a very slight pharyngeal dysphagia c/b reduced pharyngel constriction which resulted in diffuse trace coating of residue with all consistencies trialed.  Suspect this is related to generalized weakness.  There were pharyngeal residuals from recent meal on scope passage which were not pooling and did not penetration into laryngeal vestibule.  There was no penetration or aspiration of any consistencies trialed, including thin liquid dairy by straw.  Pt with difficulty chewing 2/2 edentulism and mechanical soft solids were offered.  Will maintain current vegetarian diet with daughter choosing softer foods as needed.  Recommend regular texture diet with thin liquids.    SLP Visit Diagnosis Dysphagia, oropharyngeal phase (R13.12)  Attention and concentration deficit following --  Frontal lobe and executive function deficit following --  Impact on safety and function Risk for inadequate nutrition/hydration         05/24/2024   11:31 AM  Treatment Recommendations  Treatment Recommendations Therapy as outlined in treatment plan below        05/24/2024   11:39 AM  Prognosis  Prognosis for improved oropharyngeal function --  Barriers to Reach Goals --  Barriers/Prognosis Comment --    Swallow  Evaluation Recommendations Liquid Administration via: Cup;Straw Medication Administration:  (As tolerated, no specific precautions) Caregiver Recommendations: Have oral suction available          05/24/2024   11:31 AM  Other Recommendations  Recommended Consults --  Oral Care Recommendations Oral care BID  Caregiver Recommendations --  Follow Up Recommendations Follow physician's  recommendations for discharge plan and follow up therapies  Assistance recommended at discharge --  Functional Status Assessment Patient has had a recent decline in their functional status and/or demonstrates limited ability to make significant improvements in function in a reasonable and predictable amount of time       05/24/2024   11:31 AM  Frequency and Duration   Speech Therapy Frequency (ACUTE ONLY) min 2x/week  Treatment Duration 2 weeks         05/24/2024   11:28 AM  Oral Phase  Oral Phase Impaired  Oral - Pudding Teaspoon --  Oral - Pudding Cup --  Oral - Honey Teaspoon --  Oral - Honey Cup --  Oral - Nectar Teaspoon --  Oral - Nectar Cup --  Oral - Nectar Straw --  Oral - Thin Teaspoon --  Oral - Thin Cup --  Oral - Thin Straw WFL  Oral - Puree WFL  Oral - Mech Soft Impaired mastication  Oral - Regular NT  Oral - Multi-Consistency --  Oral - Pill --  Oral Phase - Comment --       05/24/2024   11:29 AM  Pharyngeal Phase  Pharyngeal Phase --  Pharyngeal- Pudding Teaspoon --  Pharyngeal --  Pharyngeal- Pudding Cup --  Pharyngeal --  Pharyngeal- Honey Teaspoon --  Pharyngeal --  Pharyngeal- Honey Cup --  Pharyngeal --  Pharyngeal- Nectar Teaspoon --  Pharyngeal --  Pharyngeal- Nectar Cup --  Pharyngeal --  Pharyngeal- Nectar Straw --  Pharyngeal --  Pharyngeal- Thin Teaspoon --  Pharyngeal --  Pharyngeal- Thin Cup --  Pharyngeal --  Pharyngeal- Thin Straw Reduced pharyngeal peristalsis  Pharyngeal Material does not enter airway  Pharyngeal- Puree Reduced pharyngeal peristalsis  Pharyngeal Material does not enter airway  Pharyngeal- Mechanical Soft Reduced pharyngeal peristalsis  Pharyngeal Material does not enter airway  Pharyngeal- Regular --  Pharyngeal --  Pharyngeal- Multi-consistency --  Pharyngeal --  Pharyngeal- Pill --  Pharyngeal --  Pharyngeal Comment --        05/24/2024   11:30 AM  Cervical Esophageal Phase   Cervical Esophageal  Phase WFL  Pudding Teaspoon --  Pudding Cup --  Honey Teaspoon --  Honey Cup --  Nectar Teaspoon --  Nectar Cup --  Nectar Straw --  Thin Teaspoon --  Thin Cup --  Thin Straw --  Puree --  Mechanical Soft --  Regular --  Multi-consistency --  Pill --  Cervical Esophageal Comment --     Anette FORBES Grippe, MA, CCC-SLP Acute Rehabilitation Services Office: [phone] 05/24/2024, 11:40 AM

## 2024-05-25 ENCOUNTER — Telehealth: Payer: Self-pay | Admitting: Cardiology

## 2024-05-25 ENCOUNTER — Telehealth (HOSPITAL_COMMUNITY): Payer: Self-pay

## 2024-05-25 ENCOUNTER — Other Ambulatory Visit (HOSPITAL_COMMUNITY): Payer: Self-pay

## 2024-05-25 ENCOUNTER — Inpatient Hospital Stay (HOSPITAL_COMMUNITY)

## 2024-05-25 DIAGNOSIS — J189 Pneumonia, unspecified organism: Secondary | ICD-10-CM | POA: Diagnosis not present

## 2024-05-25 DIAGNOSIS — A419 Sepsis, unspecified organism: Secondary | ICD-10-CM | POA: Diagnosis not present

## 2024-05-25 LAB — URINALYSIS, ROUTINE W REFLEX MICROSCOPIC
Bilirubin Urine: NEGATIVE
Glucose, UA: NEGATIVE mg/dL
Hgb urine dipstick: NEGATIVE
Ketones, ur: NEGATIVE mg/dL
Leukocytes,Ua: NEGATIVE
Nitrite: NEGATIVE
Protein, ur: 30 mg/dL — AB
Specific Gravity, Urine: 1.01 (ref 1.005–1.030)
pH: 7.5 (ref 5.0–8.0)

## 2024-05-25 LAB — CBC
HCT: 28.3 % — ABNORMAL LOW (ref 36.0–46.0)
Hemoglobin: 9 g/dL — ABNORMAL LOW (ref 12.0–15.0)
MCH: 29.3 pg (ref 26.0–34.0)
MCHC: 31.8 g/dL (ref 30.0–36.0)
MCV: 92.2 fL (ref 80.0–100.0)
Platelets: 290 K/uL (ref 150–400)
RBC: 3.07 MIL/uL — ABNORMAL LOW (ref 3.87–5.11)
RDW: 17.5 % — ABNORMAL HIGH (ref 11.5–15.5)
WBC: 20.8 K/uL — ABNORMAL HIGH (ref 4.0–10.5)
nRBC: 1.4 % — ABNORMAL HIGH (ref 0.0–0.2)

## 2024-05-25 LAB — URINALYSIS, MICROSCOPIC (REFLEX): Bacteria, UA: NONE SEEN

## 2024-05-25 LAB — HEPARIN LEVEL (UNFRACTIONATED)
Heparin Unfractionated: >1.1 [IU]/mL — ABNORMAL HIGH (ref 0.30–0.70)
Heparin Unfractionated: >1.1 [IU]/mL — ABNORMAL HIGH (ref 0.30–0.70)

## 2024-05-25 LAB — GLUCOSE, CAPILLARY
Glucose-Capillary: 190 mg/dL — ABNORMAL HIGH (ref 70–99)
Glucose-Capillary: 125 mg/dL — ABNORMAL HIGH (ref 70–99)
Glucose-Capillary: 138 mg/dL — ABNORMAL HIGH (ref 70–99)
Glucose-Capillary: 88 mg/dL (ref 70–99)

## 2024-05-25 LAB — MAGNESIUM: Magnesium: 2.1 mg/dL (ref 1.7–2.4)

## 2024-05-25 LAB — COMPREHENSIVE METABOLIC PANEL WITH GFR
ALT: 15 U/L (ref 0–44)
AST: 34 U/L (ref 15–41)
Albumin: 2.7 g/dL — ABNORMAL LOW (ref 3.5–5.0)
Alkaline Phosphatase: 52 U/L (ref 38–126)
Anion gap: 5 (ref 5–15)
BUN: 20 mg/dL (ref 8–23)
CO2: 28 mmol/L (ref 22–32)
Calcium: 8.3 mg/dL — ABNORMAL LOW (ref 8.9–10.3)
Chloride: 104 mmol/L (ref 98–111)
Creatinine, Ser: 0.88 mg/dL (ref 0.44–1.00)
GFR, Estimated: >60 mL/min
Glucose, Bld: 80 mg/dL (ref 70–99)
Potassium: 4.8 mmol/L (ref 3.5–5.1)
Sodium: 137 mmol/L (ref 135–145)
Total Bilirubin: 0.3 mg/dL (ref 0.0–1.2)
Total Protein: 4.8 g/dL — ABNORMAL LOW (ref 6.5–8.1)

## 2024-05-25 LAB — BLOOD GAS, VENOUS
Acid-Base Excess: 1.2 mmol/L (ref 0.0–2.0)
Bicarbonate: 26.6 mmol/L (ref 20.0–28.0)
O2 Saturation: 64.2 %
Patient temperature: 37
pCO2, Ven: 44 mmHg (ref 44–60)
pH, Ven: 7.39 (ref 7.25–7.43)
pO2, Ven: 33 mmHg (ref 32–45)

## 2024-05-25 LAB — C-REACTIVE PROTEIN: CRP: 5.5 mg/dL — ABNORMAL HIGH

## 2024-05-25 LAB — SODIUM, URINE, RANDOM: Sodium, Ur: 91 mmol/L

## 2024-05-25 LAB — PHOSPHORUS: Phosphorus: 1.3 mg/dL — ABNORMAL LOW (ref 2.5–4.6)

## 2024-05-25 MED ORDER — GERHARDT'S BUTT CREAM
TOPICAL_CREAM | Freq: Two times a day (BID) | CUTANEOUS | Status: DC
  Filled 2024-05-25 (×3): qty 60

## 2024-05-25 MED ORDER — DIPHENHYDRAMINE HCL 12.5 MG/5ML PO ELIX: 6.2500 mg | ORAL_SOLUTION | Freq: Four times a day (QID) | ORAL | Status: DC | PRN

## 2024-05-25 MED ORDER — SODIUM PHOSPHATES 45 MMOLE/15ML IV SOLN
15.0000 mmol | Freq: Once | INTRAVENOUS | Status: AC
  Administered 2024-05-25: 15 mmol via INTRAVENOUS
  Filled 2024-05-25: qty 5

## 2024-05-25 MED ORDER — METOPROLOL SUCCINATE ER 25 MG PO TB24
37.5000 mg | ORAL_TABLET | Freq: Two times a day (BID) | ORAL | Status: DC
  Administered 2024-05-25 – 2024-05-29 (×6): 37.5 mg via ORAL
  Filled 2024-05-25 (×7): qty 2

## 2024-05-25 MED ORDER — HEPARIN (PORCINE) 25000 UT/250ML-% IV SOLN
400.0000 [IU]/h | INTRAVENOUS | Status: DC
  Administered 2024-05-25: 400 [IU]/h via INTRAVENOUS

## 2024-05-25 MED ORDER — HYDROMORPHONE HCL 1 MG/ML IJ SOLN
0.5000 mg | INTRAMUSCULAR | Status: DC | PRN
  Administered 2024-05-26 – 2024-05-27 (×5): 0.5 mg via INTRAVENOUS
  Filled 2024-05-25 (×5): qty 0.5

## 2024-05-25 MED ORDER — LORATADINE 10 MG PO TABS
10.0000 mg | ORAL_TABLET | Freq: Every day | ORAL | Status: DC
  Administered 2024-05-25 – 2024-05-26 (×2): 10 mg via ORAL
  Filled 2024-05-25 (×3): qty 1

## 2024-05-25 NOTE — TOC Progression Note (Signed)
 Transition of Care [facility]) - Progression Note    Patient Details  Name: RAMIYA DELAHUNTY MRN: [id number] Date of Birth: May 23, 1932  Transition of [facility]) CM/SW Contact  Rosaline JONELLE Joe, RN Phone Number: 05/25/2024, 3:39 PM  Clinical Narrative:    CM spoke with the patient's daughter, Josetta at the bedside and the daughter is ery concerned that patient is getting sicker.  She states that she is afraid that she is not being turned every 2 hours and has developed blister on her buttocks.  Patient remains on  oxygen at this time.  Patient has right arm PICC line and new Left arm PIV was placed at this time by the IV Team.  Patient is currently on a Heparin  drip IV.    I notified the MD concerning daughter's concerns and daughter has requested to speak with the MD and may want the patient transferred to another nursing unit so she can be monitored more closely.                     Expected Discharge Plan and Services                                               Social Drivers of Health (SDOH) Interventions SDOH Screenings   Food Insecurity: Patient Declined (05/18/2024)  Housing: Low Risk (11/27/2023)  Transportation Needs: Unknown (05/18/2024)  Utilities: Not At Risk (11/27/2023)  Social Connections: Unknown (11/27/2023)  Tobacco Use: Low Risk (05/17/2024)    Readmission Risk Interventions    05/23/2024    1:31 PM  Readmission Risk Prevention Plan  Transportation Screening Complete  Medication Review (RN Care Manager) Complete  PCP or Specialist appointment within 3-5 days of discharge Complete  HRI or Home Care Consult Complete  SW Recovery Care/Counseling Consult Complete  Skilled Nursing Facility Not Applicable

## 2024-05-25 NOTE — Progress Notes (Addendum)
 Speech Language Pathology Treatment: Dysphagia  Patient Details Name: Hannah [name] MRN: [id number] DOB: 23-Jul-1932 Today's Date: 05/25/2024 Time: 1239-1300 SLP Time Calculation (min) (ACUTE ONLY): 21 min  Assessment / Plan / Recommendation Clinical Impression  Pt seen at bedside for follow up and family education after FEES completed at bedside 05/24/24. Pt continues to demonstrate significant weakness and congested breathing sounds, but did not exhibit overt s/s aspiration given baked potato and ensure by her daughter. She reports pt takes meds crushed in ice cream. SLP reached out to medical team/RD to see if Magic Cup would be an appropriate addition to pt's diet. Safe swallow precautions were reviewed with pt's daughter and left to be posted at [facility]. This includes feeding pt when alert and upright, small bites/sips at slow rate, allow rest breaks if fatigued/SOB. ST will sign off at this time. Please reconsult if needs arise.    HPI HPI: 89yo female admitted from home 05/17/24 with SOB, AMS, cough, fatigue, BLE swelling, and generalized weakness. FLU-A + CTChest: Tree-in-bud opacities in the lingula, ill-defined ground glass/nodular opacities in mid LUL have mildly decreased but have not completely resolved, no new focal lung infiltrate. FEES 06/2017: flash pen thin, reg/thin. PMH: CHF, HFpEF, HTN, COPD, hypothyroidism, DM2, CKD3a.      SLP Plan  Discharge SLP treatment due to goals met        Swallow Evaluation Recommendations    Continue vegetarian diet with supplements per RD     Recommendations  Diet recommendations: Regular;Other(comment);Thin liquid (vegetarian) Liquids provided via: Cup;Straw Medication Administration: Crushed with puree Supervision: Full supervision/cueing for compensatory strategies Compensations: Minimize environmental distractions;Slow rate;Small sips/bites Postural Changes and/or Swallow Maneuvers: Seated upright 90 degrees;Upright 30-60 min after meal         Oral care QID;Oral care before and after PO   Frequent or constant Supervision/Assistance Dysphagia, oropharyngeal phase (R13.12)     Discharge SLP treatment due to (comment)    Hannah [name] B. Dory, MSP, CCC-SLP Speech Language Pathologist Office: [phone]  [name] 05/25/2024, 1:07 PM

## 2024-05-25 NOTE — Progress Notes (Signed)
 Physical Therapy Treatment Patient Details Name: Hannah [name] MRN: [id number] DOB: [birth year] Today's Date: 05/25/2024   History of Present Illness Pt is an 89 y.o. F presenting to [facility] on 05/17/24 with SOB, cough, fatigue, BLE swelling, and generalized weakness. PMH is significant for CHF, HTN, COPD, hypothyroidism, DM T2, and CKD.    PT Comments  Pt currently has declined from previous session. Daughter had multiple concerns that were addressed with medical team via secure chat. Extra time spent listening to daughters concerns over pt decline. Currently pt is Max A for rolling in order to assist with linen change. Pt repositioned on the L side with HOB elevated. Due to pt current functional status, home set up and available assistance at home recommending skilled physical therapy services 3x/week in order to address strength, balance and functional mobility to decrease risk for falls, injury and re-hospitalization.       If plan is discharge home, recommend the following: A little help with walking and/or transfers;A lot of help with bathing/dressing/bathroom;Assistance with cooking/housework;Assist for transportation;Help with stairs or ramp for entrance   Can travel by private vehicle     No  Equipment Recommendations  Rolling walker (2 wheels);Wheelchair (measurements PT);BSC/3in1       Precautions / Restrictions Precautions Precautions: Fall Recall of Precautions/Restrictions: Intact Restrictions Weight Bearing Restrictions Per Provider Order: No     Mobility  Bed Mobility Overal bed mobility: Needs Assistance Bed Mobility: Rolling Rolling: Max assist         General bed mobility comments: Max A for rolling R/L to assist with linen change.    Transfers     General transfer comment: unable at this time.       Balance       Sitting balance - Comments: unable to sit EOB today due to pain in bil LE, fatigue.      Communication Communication Communication:  Impaired Factors Affecting Communication: Hearing impaired  Cognition Arousal: Lethargic Behavior During Therapy: Flat affect   PT - Cognitive impairments: History of cognitive impairments, Sequencing, Problem solving, Initiation     Following commands: Impaired Following commands impaired: Follows one step commands inconsistently, Follows one step commands with increased time    Cueing Cueing Techniques: Verbal cues, Tactile cues, Visual cues     General Comments General comments (skin integrity, edema, etc.): Time spent in room listening to daughters concerns. Concerns were relayed to Dr. Meliton [name] Raddle MD, Rosaline Mustache RN/CM, Michelina Hock RN, Maryelizabeth Sprang RT. Daughter is concerned about pt current arousal level, cognitive status, brown staining on linens, rash with raised white areas on the R hip/buttocks, Increased pain in the bil LE, Pt is considered a soft diet but has dentures and does not get fed when family is not present and respiratory status which seems to have gotten worse over the past couple of days. Medical team is aware and addressing concerns with daughter at this time.      Pertinent Vitals/Pain Pain Assessment Pain Assessment: Faces Faces Pain Scale: Hurts little more Pain Location: grimacing with movement of bil LE Pain Descriptors / Indicators: Grimacing Pain Intervention(s): Monitored during session, Repositioned, Limited activity within patient's tolerance     PT Goals (current goals can now be found in the care plan section) Acute Rehab PT Goals Patient Stated Goal: to return home per family PT Goal Formulation: With patient/family Time For Goal Achievement: 06/02/24 Potential to Achieve Goals: Fair Progress towards PT goals: Not progressing toward goals - comment (Medical team  aware pt has declined from previous sessions)    Frequency    Min 2X/week      PT Plan  Continue with current POC        AM-PAC PT 6 Clicks Mobility    Outcome Measure  Help needed turning from your back to your side while in a flat bed without using bedrails?: A Lot Help needed moving from lying on your back to sitting on the side of a flat bed without using bedrails?: A Lot Help needed moving to and from a bed to a chair (including a wheelchair)?: Total Help needed standing up from a chair using your arms (e.g., wheelchair or bedside chair)?: Total Help needed to walk in hospital room?: Total Help needed climbing 3-5 steps with a railing? : Total 6 Click Score: 8    End of Session Equipment Utilized During Treatment: Oxygen Activity Tolerance: Patient limited by fatigue;Patient limited by lethargy Patient left: in bed;with call bell/phone within reach;with family/visitor present;with nursing/sitter in room Nurse Communication: Other (comment) (Medical team was notified of daughters concerns and current deterioration of function) PT Visit Diagnosis: Unsteadiness on feet (R26.81);Other abnormalities of gait and mobility (R26.89);Muscle weakness (generalized) (M62.81);Difficulty in walking, not elsewhere classified (R26.2)     Time: 8667-8580 PT Time Calculation (min) (ACUTE ONLY): 47 min  Charges:    $Therapeutic Activity: 8-22 mins PT General Charges $$ ACUTE PT VISIT: 1 Visit                     Dorothyann Maier, DPT, CLT  Acute Rehabilitation Services Office: [phone] (Secure chat preferred)    Dorothyann VEAR Maier 05/25/2024, 4:51 PM

## 2024-05-25 NOTE — TOC Progression Note (Incomplete Revision)
 Transition of Care [facility]) - Progression Note    Patient Details  Name: Hannah [name] MRN: [id number] Date of Birth: May 23, 1932  Transition of [facility]) CM/SW Contact  Hannah JONELLE Joe, RN Phone Number: 05/25/2024, 3:39 PM  Clinical Narrative:    CM spoke with the patient's daughter, Hannah [name] at the bedside and the daughter is ery concerned that patient is getting sicker.  She states that she is afraid that she is not being turned every 2 hours and has developed blister on her buttocks.  Patient remains on  oxygen at this time.  Patient has right arm PICC line and new Left arm PIV was placed at this time by the IV Team.  Patient is currently on a Heparin  drip IV.    I notified the MD concerning daughter's concerns and daughter has requested to speak with the MD and may want the patient transferred to another nursing unit so she can be monitored more closely.                     Expected Discharge Plan and Services                                               Social Drivers of Health (SDOH) Interventions SDOH Screenings   Food Insecurity: Patient Declined (05/18/2024)  Housing: Low Risk (11/27/2023)  Transportation Needs: Unknown (05/18/2024)  Utilities: Not At Risk (11/27/2023)  Social Connections: Unknown (11/27/2023)  Tobacco Use: Low Risk (05/17/2024)    Readmission Risk Interventions    05/23/2024    1:31 PM  Readmission Risk Prevention Plan  Transportation Screening Complete  Medication Review (RN Care Manager) Complete  PCP or Specialist appointment within 3-5 days of discharge Complete  HRI or Home Care Consult Complete  SW Recovery Care/Counseling Consult Complete  Skilled Nursing Facility Not Applicable

## 2024-05-25 NOTE — Progress Notes (Signed)
 PHARMACY - ANTICOAGULATION  Pharmacy Consult for IV Heparin  Indication: DVT Brief A/P: Heparin  level supratherapeutic Decrease Heparin  rate  Allergies[1]  Patient Measurements: Height: 5' (152.4 cm) Weight: 46.3 kg (102 lb 1.2 oz) IBW/kg (Calculated) : 45.5 HEPARIN  DW (KG): 41.5  Vital Signs: Temp: 98.4 F (36.9 C) (01/09 1150) Temp Source: Oral (01/09 1150) BP: 150/74 (01/09 1150) Pulse Rate: 97 (01/09 1150)  Labs: Recent Labs    05/23/24 0228 05/23/24 0233 05/24/24 0853 05/25/24 0245 05/25/24 1502  HGB 9.8*  --  10.4* 9.0*  --   HCT 29.7*  --  31.3* 28.3*  --   PLT 311  --  340 290  --   HEPARINUNFRC  --   --   --  >1.10* >1.10*  CREATININE  --  0.92 0.95 0.88  --     Estimated Creatinine Clearance: 31.1 mL/min (by C-G formula based on SCr of 0.88 mg/dL).   Assessment: 89 y.o. female with femoral vein DVT on LE dopplers for heparin   Heparin  level is supra-therapeutic. CBC is stable. No bleeding observed. Vitals are stable.   Goal of Therapy:  Heparin  level 0.3-0.7 units/ml Monitor platelets by anticoagulation protocol: Yes   Plan:  Hold heparin  x 1.5 hour.  Restart Heparin  at decreased rate of 400 units/hr Check heparin  level in 6-8 hours.  Harlene Boga, PharmD Please refer to [facility] for [facility] Pharmacy numbers 05/25/2024,4:08 PM       [1]  Allergies Allergen Reactions   Penicillins Rash and Other (See Comments)    1980's   Sulfa Antibiotics Rash

## 2024-05-25 NOTE — Telephone Encounter (Signed)
 I spoke with patient, and Dr. Ladona.

## 2024-05-25 NOTE — Plan of Care (Signed)
  Problem: Coping: Goal: Ability to adjust to condition or change in health will improve Outcome: Progressing   Problem: Fluid Volume: Goal: Ability to maintain a balanced intake and output will improve Outcome: Not Progressing

## 2024-05-25 NOTE — Telephone Encounter (Signed)
 Mychart message received:   Comments: Need to speak with Dr Ladona or Ted it is Urgent regarding my mother Hannah [name].

## 2024-05-25 NOTE — Progress Notes (Addendum)
 " PROGRESS NOTE    Hannah [name]  FMW:[id number] DOB: 1932-08-10 DOA: 05/17/2024 PCP: Sun, Vyvyan, MD  Chief Complaint  Patient presents with   Shortness of Breath   Altered Mental Status    Brief Narrative:   89 year old female with past medical history significant for HFpEF, hypertension, COPD, hypothyroidism, chronic kidney disease stage III and type 2 diabetes mellitus who presented with worsening shortness of breath and edema.   She's been diagnosed with influenza, MSSA pneumonia, and MSSA bacteremia.    She's been seen by ID.  Cardiology was c/s for SVT.    Assessment & Plan:   Principal Problem:   Sepsis due to pneumonia [facility]) Active Problems:   Acute hypoxic respiratory failure (HCC)   Influenza Hannah [name] with pneumonia   Chronic obstructive asthma (HCC)   Hypertension   Insulin  dependent type 2 diabetes mellitus (HCC)   CKD stage 3a, GFR 45-59 ml/min (HCC)   Iron  deficiency anemia   UTI (urinary tract infection)   Hypothyroidism   Protein-calorie malnutrition, severe  Acute hypoxic respiratory failure Influenza Hannah [name] with MSSA pneumonia MSSA bacteremia: CT 1/1 with ground glass nodular densities within tree in bud appearance primarily in the lingula and to Hannah [name] lesser  degree in the LLL 1/1 blood culture with staph aureus and abiotrophia defectiva (suspected contaminant, but follow follow up culture) 1/3 blood culture NGTDx4 Appreciate ID recommendations - continue ancef  while inpatient with goal of 7 days of IV abx and then transition to linezolid  600 mg PO BID with total of 4 weeks of treatment through 06/16/2024. Tamiflu  1/2-1/6 Appreciate ID recs - recommending continue cefazolin  while inpatient (goal at least 7 days IV abx) and transition to linezolid  600 mg PO bid with total of 4 weeks of treatment through 06/16/2024.   CT CAP 1/7 without acute findings in abdomen or pelvis, tree in bud opacities in lingula and ill defined ground glass/nodular opacities in the mid left upper  lobe have decreased, but not resolved.   TTE without notable valvular abnormalities - no plan for TEE Significant leukocytosis in setting of steroid use - improving today.  Steroids d/c'd 1/7. CXR pending, lots of transmitted upper airways sounds today + lethargy, at risk of aspiration  Acute Metabolic Encephalopathy VBG without hypercarbia B12, ammonia, folate Suspect delirium - waxing and waning throughout today I think head CT likely unlikely to change management, but we can consider it  Left Lower Extremity DVT On 1/8 US  Heparin  gtt Daughters asked about ruling out PE (could consider CT PE protocol, but unlikely to change our management as she's already on heparin  gtt, will reevaluate tmrw)  [name] Described as cramping, unclear cause [name] appear pale, but palpable bilateral pulses.  No notable masses or visible abnormalities.  Compartments soft.   Intermittent SVT: Short intermittent runs Will increase metop to 37.5 mg BID Appreciate cardiology assistance - they've signed off 1/7 (see note).  Replace lytes to maintain K>4 and mag >2.  Concern for Poor UOP Urinary Retention Now s/p foley placement Oxybutyning on hold 1/9  Possible UTI: No urine culture collected UA at presentation with +[name] - watch for UTI symptoms  Concern for Possible Dysphagia Thrush SLP eval, appreciate assistance fluconazole  Patient has history of oral cancer.  HFpEF Echo 05/18/2024 revealed normal left ventricular ejection fraction, with grade 1 diastolic dysfunction. Appears euvolemic to dry Cardiology now signed of 1/7.   Will diurese as needed   AKI Aki from presentation has improved  Left 2nd Toe Injury  Plain films unremarkable Supportive care  Dementia: Delirium precautions Continue Aricept .   T2DM Steroid induced hyperglycemia Basal, bolus, SSI Stop steroids Monitor  Loose watery diarrhea: + c diff antigen, negative PCR.  C diff ruled out with ID  assistance, appreciate assistance - suspected abx associated diarrhea   Hypokalemia Hypomagnesemia Hypophosphatemia Follow and replace as needed   Poor PO intake Severe Protein Calorie Malnutrition RD Body mass index is 19.93 kg/m.  Concern regarding frequent blood draws.  S/p PICC placement 1/8    DVT prophylaxis: heparin  Code Status: full Family Communication: none Disposition:   Status is: Inpatient Remains inpatient appropriate because: need for continued inpatient care   Consultants:  Palliative care cardiology  Procedures:  Echo IMPRESSIONS     1. Left ventricular ejection fraction, by estimation, is 60 to 65%. The  left ventricle has normal function. The left ventricle has no regional  wall motion abnormalities. Left ventricular diastolic parameters are  consistent with Grade I diastolic  dysfunction (impaired relaxation).   2. Right ventricular systolic function is normal. The right ventricular  size is normal.   3. The mitral valve is normal in structure. Mild mitral valve  regurgitation. No evidence of mitral stenosis.   4. The aortic valve is tricuspid. Aortic valve regurgitation is not  visualized. No aortic stenosis is present.   5. The inferior vena cava is normal in size with greater than 50%  respiratory variability, suggesting right atrial pressure of 3 mmHg.   Antimicrobials:  Anti-infectives (From admission, onward)    Start     Dose/Rate Route Frequency Ordered Stop   05/25/24 1000  fluconazole  (DIFLUCAN ) tablet 50 mg       Placed in Followed by Linked Group   50 mg Oral Daily 05/24/24 1049 06/03/24 0959   05/24/24 1145  fluconazole  (DIFLUCAN ) tablet 100 mg       Placed in Followed by Linked Group   100 mg Oral  Once 05/24/24 1049 05/24/24 1234   05/24/24 0000  linezolid  (ZYVOX ) 600 MG tablet        600 mg Oral 2 times daily 05/22/24 1502 06/14/24 2359   05/21/24 1230  fluconazole  (DIFLUCAN ) tablet 50 mg  Status:  Discontinued         50 mg Oral Daily 05/21/24 1140 05/21/24 1432   05/18/24 1829  oseltamivir  (TAMIFLU ) capsule 30 mg        30 mg Oral Daily 05/18/24 1829 05/22/24 0941   05/18/24 1545  ceFAZolin  (ANCEF ) IVPB 2g/100 mL premix        2 g 200 mL/hr over 30 Minutes Intravenous Every 12 hours 05/18/24 1451     05/18/24 1415  azithromycin  (ZITHROMAX ) 500 mg in sodium chloride  0.9 % 250 mL IVPB  Status:  Discontinued        500 mg 250 mL/hr over 60 Minutes Intravenous  Once 05/18/24 1326 05/18/24 1332   05/18/24 1415  cefTRIAXone  (ROCEPHIN ) 1 g in sodium chloride  0.9 % 100 mL IVPB  Status:  Discontinued        1 g 200 mL/hr over 30 Minutes Intravenous Every 24 hours 05/18/24 1326 05/18/24 1334   05/17/24 2200  oseltamivir  (TAMIFLU ) capsule 30 mg  Status:  Discontinued        30 mg Oral 2 times daily 05/17/24 2025 05/18/24 1829   05/17/24 2100  ceFEPIme  (MAXIPIME ) 2 g in sodium chloride  0.9 % 100 mL IVPB  Status:  Discontinued        2 g  200 mL/hr over 30 Minutes Intravenous Every 24 hours 05/17/24 2057 05/18/24 1451   05/17/24 2100  vancomycin  (VANCOCIN ) IVPB 1000 mg/200 mL premix        1,000 mg 200 mL/hr over 60 Minutes Intravenous  Once 05/17/24 2057 05/17/24 2324   05/17/24 2056  vancomycin  variable dose per unstable renal function (pharmacist dosing)  Status:  Discontinued         Does not apply See admin instructions 05/17/24 2057 05/18/24 1451   05/17/24 1900  cefTRIAXone  (ROCEPHIN ) 1 g in sodium chloride  0.9 % 100 mL IVPB        1 g 200 mL/hr over 30 Minutes Intravenous  Once 05/17/24 1849 05/17/24 1940   05/17/24 1900  azithromycin  (ZITHROMAX ) 500 mg in sodium chloride  0.9 % 250 mL IVPB        500 mg 250 mL/hr over 60 Minutes Intravenous  Once 05/17/24 1849 05/17/24 2115       Subjective:  Daughter at bedside Hannah [name]) Patient is more lethargic today C/o [name] earlier   Objective: Vitals:   05/25/24 0805 05/25/24 0811 05/25/24 1150 05/25/24 1342  BP:  131/70 (!) 150/74   Pulse:  92 97    Resp:  18 19   Temp:   98.4 F (36.9 C)   TempSrc:   Oral   SpO2: 98% 99% 100% 93%  Weight:      Height:        Intake/Output Summary (Last 24 hours) at 05/25/2024 1603 Last data filed at 05/25/2024 9361 Gross per 24 hour  Intake --  Output 975 ml  Net -975 ml   Filed Weights   05/23/24 0500 05/24/24 0415 05/25/24 0407  Weight: 40.3 kg 40.3 kg 46.3 kg    Examination:  General: chronically ill appearing, more lethargic today Cardiovascular: RRR Lungs: coarse transmitted upper airway sounds Abdomen: mild distension, nontender Neurological: lethargic today, moving all extremities Extremities: No clubbing or cyanosis. No edema. Palpable DP pulses bilaterally, pale [name]'s  Data Reviewed: I have personally reviewed following labs and imaging studies  CBC: Recent Labs  Lab 05/20/24 0215 05/21/24 0228 05/23/24 0228 05/24/24 0853 05/25/24 0245  WBC 16.9* 16.0* 20.9* 27.3* 20.8*  NEUTROABS  --  12.3*  --  19.0*  --   HGB 10.3* 10.2* 9.8* 10.4* 9.0*  HCT 31.0* 30.9* 29.7* 31.3* 28.3*  MCV 88.8 87.8 88.1 88.2 92.2  PLT 265 290 311 340 290    Basic Metabolic Panel: Recent Labs  Lab 05/21/24 0228 05/22/24 0926 05/23/24 0228 05/23/24 0233 05/24/24 0853 05/25/24 0245  NA 139 139  --  140 134* 137  K 3.1* 3.9  --  4.3 4.9 4.8  CL 101 101  --  105 101 104  CO2 26 23  --  24 24 28   GLUCOSE 192* 316*  --  145* 168* 80  BUN 18 18  --  19 18 20   CREATININE 1.04* 1.06*  --  0.92 0.95 0.88  CALCIUM  8.2* 8.1*  --  8.0* 8.3* 8.3*  MG 1.8 2.3 2.2  --  2.3 2.1  PHOS 1.4* 2.6  --  2.2* 1.8* 1.3*    GFR: Estimated Creatinine Clearance: 31.1 mL/min (by C-G formula based on SCr of 0.88 mg/dL).  Liver Function Tests: Recent Labs  Lab 05/21/24 0228 05/22/24 0926 05/23/24 0233 05/24/24 0853 05/25/24 0245  AST  --   --   --  45* 34  ALT  --   --   --  18  15  ALKPHOS  --   --   --  46 52  BILITOT  --   --   --  0.3 0.3  PROT  --   --   --  5.4* 4.8*  ALBUMIN 3.2* 3.3* 3.1*  3.2* 2.7*    CBG: Recent Labs  Lab 05/24/24 1159 05/24/24 1652 05/24/24 2031 05/25/24 0810 05/25/24 1210  GLUCAP 281* 156* 113* 190* 125*     Recent Results (from the past 240 hours)  Resp panel by RT-PCR (RSV, Flu Hannah [name]&B, Covid) Anterior Nasal Swab     Status: Abnormal   Collection Time: 05/17/24  5:55 PM   Specimen: Anterior Nasal Swab  Result Value Ref Range Status   SARS Coronavirus 2 by RT PCR NEGATIVE NEGATIVE Final    Comment: (NOTE) SARS-CoV-2 target nucleic acids are NOT DETECTED.  The SARS-CoV-2 RNA is generally detectable in upper respiratory specimens during the acute phase of infection. The lowest concentration of SARS-CoV-2 viral copies this assay can detect is 138 copies/mL. Hannah [name] negative result does not preclude SARS-Cov-2 infection and should not be used as the sole basis for treatment or other patient management decisions. Hannah [name] negative result may occur with  improper specimen collection/handling, submission of specimen other than nasopharyngeal swab, presence of viral mutation(s) within the areas targeted by this assay, and inadequate number of viral copies(<138 copies/mL). Hannah [name] negative result must be combined with clinical observations, patient history, and epidemiological information. The expected result is Negative.  Fact Sheet for Patients:  bloggercourse.com  Fact Sheet for Healthcare Providers:  seriousbroker.it  This test is no t yet approved or cleared by the [place]  FDA and  has been authorized for detection and/or diagnosis of SARS-CoV-2 by FDA under an Emergency Use Authorization (EUA). This EUA will remain  in effect (meaning this test can be used) for the duration of the COVID-19 declaration under Section 564(b)(1) of the Act, 21 U.S.C.section 360bbb-3(b)(1), unless the authorization is terminated  or revoked sooner.       Influenza Hannah [name] by PCR POSITIVE (Yves Fodor) NEGATIVE Final   Influenza B by  PCR NEGATIVE NEGATIVE Final    Comment: (NOTE) The Xpert Xpress SARS-CoV-2/FLU/RSV plus assay is intended as an aid in the diagnosis of influenza from Nasopharyngeal swab specimens and should not be used as Hannah [name] sole basis for treatment. Nasal washings and aspirates are unacceptable for Xpert Xpress SARS-CoV-2/FLU/RSV testing.  Fact Sheet for Patients: bloggercourse.com  Fact Sheet for Healthcare Providers: seriousbroker.it  This test is not yet approved or cleared by the [place]  FDA and has been authorized for detection and/or diagnosis of SARS-CoV-2 by FDA under an Emergency Use Authorization (EUA). This EUA will remain in effect (meaning this test can be used) for the duration of the COVID-19 declaration under Section 564(b)(1) of the Act, 21 U.S.C. section 360bbb-3(b)(1), unless the authorization is terminated or revoked.     Resp Syncytial Virus by PCR NEGATIVE NEGATIVE Final    Comment: (NOTE) Fact Sheet for Patients: bloggercourse.com  Fact Sheet for Healthcare Providers: seriousbroker.it  This test is not yet approved or cleared by the [place]  FDA and has been authorized for detection and/or diagnosis of SARS-CoV-2 by FDA under an Emergency Use Authorization (EUA). This EUA will remain in effect (meaning this test can be used) for the duration of the COVID-19 declaration under Section 564(b)(1) of the Act, 21 U.S.C. section 360bbb-3(b)(1), unless the authorization is terminated or revoked.  Performed at [facility], [address],  [place], [place] [address]   Culture, blood (single)     Status: Abnormal   Collection Time: 05/17/24  7:15 PM   Specimen: BLOOD LEFT FOREARM  Result Value Ref Range Status   Specimen Description   Final    BLOOD LEFT FOREARM Performed at [facility], [address] [place], [place] [address]     Special Requests   Final    BOTTLES DRAWN AEROBIC AND ANAEROBIC Blood Culture adequate volume Performed at [facility], [address], [place], [place] [address]    Culture  Setup Time   Final    GRAM POSITIVE COCCI IN CLUSTERS AEROBIC BOTTLE ONLY CRITICAL RESULT CALLED TO, READ BACK BY AND VERIFIED WITH: PHARMD JEREMY F [id number] FCP GRAM POSITIVE COCCI IN CHAINS ANAEROBIC BOTTLE ONLY RBV JESICCA CARNIE PHARMD 05/19/2024 BY DD @ 0134    Culture ([place])  Final    STAPHYLOCOCCUS AUREUS ABIOTROPHIA DEFECTIVA Standardized susceptibility testing for this organism is not available. Performed at [facility], [address] [place], [place] [address]    Report Status 05/20/2024 FINAL  Final   Organism ID, Bacteria STAPHYLOCOCCUS AUREUS  Final      Susceptibility   Staphylococcus aureus - MIC*    CIPROFLOXACIN  >=8 RESISTANT Resistant     ERYTHROMYCIN <=0.25 SENSITIVE Sensitive     GENTAMICIN <=0.5 SENSITIVE Sensitive     OXACILLIN 0.5 SENSITIVE Sensitive     TETRACYCLINE <=1 SENSITIVE Sensitive     VANCOMYCIN  1 SENSITIVE Sensitive     TRIMETH/SULFA <=10 SENSITIVE Sensitive     CLINDAMYCIN <=0.25 SENSITIVE Sensitive     RIFAMPIN <=0.5 SENSITIVE Sensitive     Inducible Clindamycin NEGATIVE Sensitive     LINEZOLID  2 SENSITIVE Sensitive     * STAPHYLOCOCCUS AUREUS  Blood Culture ID Panel (Reflexed)     Status: Abnormal   Collection Time: 05/17/24  7:15 PM  Result Value Ref Range Status   Enterococcus faecalis NOT DETECTED NOT DETECTED Final   Enterococcus Faecium NOT DETECTED NOT DETECTED Final   Listeria monocytogenes NOT DETECTED NOT DETECTED Final   Staphylococcus species DETECTED (Hannah [name]) NOT DETECTED Final    Comment: CRITICAL RESULT CALLED TO, READ BACK BY AND VERIFIED WITH: PHARMD JEREMY F [id number] FCP    Staphylococcus aureus (BCID) DETECTED (Hannah [name]) NOT DETECTED Final    Comment: CRITICAL RESULT CALLED TO, READ BACK BY AND VERIFIED WITH: PHARMD JEREMY F  [id number] FCP    Staphylococcus epidermidis NOT DETECTED NOT DETECTED Final   Staphylococcus lugdunensis NOT DETECTED NOT DETECTED Final   Streptococcus species NOT DETECTED NOT DETECTED Final   Streptococcus agalactiae NOT DETECTED NOT DETECTED Final   Streptococcus pneumoniae NOT DETECTED NOT DETECTED Final   Streptococcus pyogenes NOT DETECTED NOT DETECTED Final   Yassmine Tamm.calcoaceticus-baumannii NOT DETECTED NOT DETECTED Final   Bacteroides fragilis NOT DETECTED NOT DETECTED Final   Enterobacterales NOT DETECTED NOT DETECTED Final   Enterobacter cloacae complex NOT DETECTED NOT DETECTED Final   Escherichia coli NOT DETECTED NOT DETECTED Final   Klebsiella aerogenes NOT DETECTED NOT DETECTED Final   Klebsiella oxytoca NOT DETECTED NOT DETECTED Final   Klebsiella pneumoniae NOT DETECTED NOT DETECTED Final   Proteus species NOT DETECTED NOT DETECTED Final   Salmonella species NOT DETECTED NOT DETECTED Final   Serratia marcescens NOT DETECTED NOT DETECTED Final   Haemophilus influenzae NOT DETECTED NOT DETECTED Final   Neisseria meningitidis NOT DETECTED NOT DETECTED Final   Pseudomonas aeruginosa NOT DETECTED NOT DETECTED  Final   Stenotrophomonas maltophilia NOT DETECTED NOT DETECTED Final   Candida albicans NOT DETECTED NOT DETECTED Final   Candida auris NOT DETECTED NOT DETECTED Final   Candida glabrata NOT DETECTED NOT DETECTED Final   Candida krusei NOT DETECTED NOT DETECTED Final   Candida parapsilosis NOT DETECTED NOT DETECTED Final   Candida tropicalis NOT DETECTED NOT DETECTED Final   Cryptococcus neoformans/gattii NOT DETECTED NOT DETECTED Final   Meth resistant mecA/C and MREJ NOT DETECTED NOT DETECTED Final    Comment: Performed at [facility], [address] [place], [place] [address]  Blood Culture ID Panel (Reflexed)     Status: None   Collection Time: 05/18/24  7:15 PM  Result Value Ref Range Status   Enterococcus faecalis NOT DETECTED NOT DETECTED Final    Enterococcus Faecium NOT DETECTED NOT DETECTED Final   Listeria monocytogenes NOT DETECTED NOT DETECTED Final   Staphylococcus species NOT DETECTED NOT DETECTED Final   Staphylococcus aureus (BCID) NOT DETECTED NOT DETECTED Final   Staphylococcus epidermidis NOT DETECTED NOT DETECTED Final   Staphylococcus lugdunensis NOT DETECTED NOT DETECTED Final   Streptococcus species NOT DETECTED NOT DETECTED Final   Streptococcus agalactiae NOT DETECTED NOT DETECTED Final   Streptococcus pneumoniae NOT DETECTED NOT DETECTED Final   Streptococcus pyogenes NOT DETECTED NOT DETECTED Final   Birttany Dechellis.calcoaceticus-baumannii NOT DETECTED NOT DETECTED Final   Bacteroides fragilis NOT DETECTED NOT DETECTED Final   Enterobacterales NOT DETECTED NOT DETECTED Final   Enterobacter cloacae complex NOT DETECTED NOT DETECTED Final   Escherichia coli NOT DETECTED NOT DETECTED Final   Klebsiella aerogenes NOT DETECTED NOT DETECTED Final   Klebsiella oxytoca NOT DETECTED NOT DETECTED Final   Klebsiella pneumoniae NOT DETECTED NOT DETECTED Final   Proteus species NOT DETECTED NOT DETECTED Final   Salmonella species NOT DETECTED NOT DETECTED Final   Serratia marcescens NOT DETECTED NOT DETECTED Final   Haemophilus influenzae NOT DETECTED NOT DETECTED Final   Neisseria meningitidis NOT DETECTED NOT DETECTED Final   Pseudomonas aeruginosa NOT DETECTED NOT DETECTED Final   Stenotrophomonas maltophilia NOT DETECTED NOT DETECTED Final   Candida albicans NOT DETECTED NOT DETECTED Final   Candida auris NOT DETECTED NOT DETECTED Final   Candida glabrata NOT DETECTED NOT DETECTED Final   Candida krusei NOT DETECTED NOT DETECTED Final   Candida parapsilosis NOT DETECTED NOT DETECTED Final   Candida tropicalis NOT DETECTED NOT DETECTED Final   Cryptococcus neoformans/gattii NOT DETECTED NOT DETECTED Final    Comment: Performed at [facility], [address], [place], [place] [address]  Culture, blood (Routine X 2) w Reflex  to ID Panel     Status: None   Collection Time: 05/19/24  9:04 AM   Specimen: BLOOD LEFT ARM  Result Value Ref Range Status   Specimen Description BLOOD LEFT ARM  Final   Special Requests   Final    BOTTLES DRAWN AEROBIC AND ANAEROBIC Blood Culture adequate volume   Culture   Final    NO GROWTH 5 DAYS Performed at [facility], [address], [place], [place] [address]    Report Status 05/24/2024 FINAL  Final  Culture, blood (Routine X 2) w Reflex to ID Panel     Status: None   Collection Time: 05/19/24  9:12 AM   Specimen: BLOOD LEFT HAND  Result Value Ref Range Status   Specimen Description BLOOD LEFT HAND  Final   Special Requests   Final  BOTTLES DRAWN AEROBIC AND ANAEROBIC Blood Culture results may not be optimal due to an inadequate volume of blood received in culture bottles   Culture   Final    NO GROWTH 5 DAYS Performed at [facility], [address] [place], [place] [address]    Report Status 05/24/2024 FINAL  Final  C Difficile Quick Screen w PCR reflex     Status: Abnormal   Collection Time: 05/19/24  8:29 PM   Specimen: STOOL  Result Value Ref Range Status   C Diff antigen POSITIVE (Stevenson Windmiller) NEGATIVE Final   C Diff toxin NEGATIVE NEGATIVE Final   C Diff interpretation Results are indeterminate. See PCR results.  Final    Comment: Performed at [facility], [address] [place], [place] [address]  C. Diff by PCR, Reflexed     Status: None   Collection Time: 05/19/24  8:29 PM  Result Value Ref Range Status   Toxigenic C. Difficile by PCR NEGATIVE NEGATIVE Final    Comment: Patient is colonized with non toxigenic C. difficile. May not need treatment unless significant symptoms are present.   Hypervirulent Strain PRESUMPTIVE NEGATIVE PRESUMPTIVE NEGATIVE Final    Comment: Performed at [facility], [address], [place], [place] [address]  Gastrointestinal Panel by PCR , Stool     Status: None   Collection Time: 05/20/24 12:10 AM    Specimen: Stool  Result Value Ref Range Status   Campylobacter species NOT DETECTED NOT DETECTED Final   Plesimonas shigelloides NOT DETECTED NOT DETECTED Final   Salmonella species NOT DETECTED NOT DETECTED Final   Yersinia enterocolitica NOT DETECTED NOT DETECTED Final   Vibrio species NOT DETECTED NOT DETECTED Final   Vibrio cholerae NOT DETECTED NOT DETECTED Final   Enteroaggregative E coli (EAEC) NOT DETECTED NOT DETECTED Final   Enteropathogenic E coli (EPEC) NOT DETECTED NOT DETECTED Final   Enterotoxigenic E coli (ETEC) NOT DETECTED NOT DETECTED Final   Shiga like toxin producing E coli (STEC) NOT DETECTED NOT DETECTED Final   Shigella/Enteroinvasive E coli (EIEC) NOT DETECTED NOT DETECTED Final   Cryptosporidium NOT DETECTED NOT DETECTED Final   Cyclospora cayetanensis NOT DETECTED NOT DETECTED Final   Entamoeba histolytica NOT DETECTED NOT DETECTED Final   Giardia lamblia NOT DETECTED NOT DETECTED Final   Adenovirus F40/41 NOT DETECTED NOT DETECTED Final   Astrovirus NOT DETECTED NOT DETECTED Final   Norovirus GI/GII NOT DETECTED NOT DETECTED Final   Rotavirus Maeci Kalbfleisch NOT DETECTED NOT DETECTED Final   Sapovirus (I, II, IV, and V) NOT DETECTED NOT DETECTED Final    Comment: Performed at [facility], [address] [place], [place] [address]         Radiology Studies: VAS US  LOWER EXTREMITY VENOUS (DVT) Result Date: 05/24/2024  Lower Venous DVT Study Patient Name:  Hannah [name]  Date of Exam:   05/24/2024 Medical Rec #: [id number]        Accession #:    [id number] Date of Birth: 1932/10/31        Patient Gender: F Patient Age:   45 years Exam Location:  [facility] Procedure:      VAS US  LOWER EXTREMITY VENOUS (DVT) Referring Phys: Shunsuke Granzow POWELL JR --------------------------------------------------------------------------------  Indications: Edema, and SOB. Other Indications: Fatigue, weakness. Comparison Study: No prior exam. Performing Technologist: Edilia Elden Appl   Examination Guidelines: Madailein Londo complete evaluation includes B-mode imaging, spectral Doppler, color Doppler, and power Doppler as needed of all  accessible portions of each vessel. Bilateral testing is considered an integral part of Noraa Pickeral complete examination. Limited examinations for reoccurring indications may be performed as noted. The reflux portion of the exam is performed with the patient in reverse Trendelenburg.  +-----+---------------+---------+-----------+----------+--------------+ RIGHTCompressibilityPhasicitySpontaneityPropertiesThrombus Aging +-----+---------------+---------+-----------+----------+--------------+ CFV  Full           Yes      Yes                                 +-----+---------------+---------+-----------+----------+--------------+ SFJ  Full           Yes      Yes                                 +-----+---------------+---------+-----------+----------+--------------+   +---------+---------------+---------+-----------+----------+--------------+ LEFT     CompressibilityPhasicitySpontaneityPropertiesThrombus Aging +---------+---------------+---------+-----------+----------+--------------+ CFV      Partial        Yes      Yes                                 +---------+---------------+---------+-----------+----------+--------------+ SFJ      Full           Yes      Yes                                 +---------+---------------+---------+-----------+----------+--------------+ FV Prox  None           No       No                                  +---------+---------------+---------+-----------+----------+--------------+ FV Mid   None           No       No                                  +---------+---------------+---------+-----------+----------+--------------+ FV DistalNone           No       No                                  +---------+---------------+---------+-----------+----------+--------------+ PFV      Partial        Yes       Yes                                 +---------+---------------+---------+-----------+----------+--------------+ POP      Full           Yes      Yes                                 +---------+---------------+---------+-----------+----------+--------------+ PTV      Full                                                        +---------+---------------+---------+-----------+----------+--------------+  PERO     Full                                                        +---------+---------------+---------+-----------+----------+--------------+ Deep vein thrombosis noted in the distal common femoral at the bifurcation of the proximal femoral and profunda, the profunda vein, the proximal, middle and distal femoral vein. Cystic structure noted in the left popliteal fossa measuring 2.5 x06 x 1.7 cm.    Summary: RIGHT: - No evidence of common femoral vein obstruction.   LEFT: - Findings consistent with acute deep vein thrombosis involving the left common femoral vein, left femoral vein, and left proximal profunda vein.  - TRUE [place] cystic structure is found in the popliteal fossa.  *See table(s) above for measurements and observations. Electronically signed by Debby Robertson on 05/24/2024 at 8:14:01 PM.    Final    DG Toe 2nd Left Result Date: 05/24/2024 EXAM: 1 VIEW(S) XRAY OF THE LATERALITY TOES 05/24/2024 11:46:00 AM COMPARISON: None available. CLINICAL HISTORY: Pain [id number] FINDINGS: BONES AND JOINTS: No acute fracture. No malalignment. SOFT TISSUES: Unremarkable. No radiopaque foreign body. IMPRESSION: 1. No acute fracture or dislocation. Electronically signed by: Rogelia Myers MD MD 05/24/2024 06:05 PM EST RP Workstation: [id number]   US  EKG SITE RITE Result Date: 05/24/2024 If Site Rite image not attached, placement could not be confirmed due to current cardiac rhythm.  CT CHEST ABDOMEN PELVIS WO CONTRAST Result Date: 05/23/2024 EXAM: CT CHEST, ABDOMEN AND PELVIS WITHOUT CONTRAST 05/23/2024  05:09:22 PM TECHNIQUE: CT of the chest, abdomen and pelvis was performed without the administration of intravenous contrast. Multiplanar reformatted images are provided for review. Automated exposure control, iterative reconstruction, and/or weight based adjustment of the mA/kV was utilized to reduce the radiation dose to as low as reasonably achievable. COMPARISON: Chest CT 05/17/2024. CT angiogram abdomen and pelvis 11/29/2023. CLINICAL HISTORY: Pneumonia, complication suspected, xray done; abdomen pain. FINDINGS: CHEST: MEDIASTINUM AND LYMPH NODES: Heart and pericardium are unremarkable. Coronary and aortic atherosclerotic calcifications are noted. The central airways are clear. No mediastinal, hilar or axillary lymphadenopathy. LUNGS AND PLEURA: Stable scarring in the right lung apex. Tree in bud opacities in the lingula and ill defined ground glass/nodular opacities in the mid left upper lobe have mildly decreased, but have not completely resolved. Linear areas of scarring in the inferior right upper lobe persist. No new focal lung infiltrate. No pleural effusion. No pneumothorax. ABDOMEN AND PELVIS: LIVER: Unremarkable. GALLBLADDER AND BILE DUCTS: Small gallstone is present. No biliary ductal dilatation. SPLEEN: No acute abnormality. PANCREAS: No acute abnormality. ADRENAL GLANDS: No acute abnormality. KIDNEYS, URETERS AND BLADDER: No stones in the kidneys or ureters. No hydronephrosis. No perinephric or periureteral stranding. Urinary bladder is unremarkable. GI AND BOWEL: Stomach demonstrates no acute abnormality. There is Malacai Grantz large amount of stool throughout the colon. There is sigmoid colon diverticulosis. The appendix is not visualized. There is no bowel obstruction. REPRODUCTIVE ORGANS: No acute abnormality. PERITONEUM AND RETROPERITONEUM: No ascites. No free air. VASCULATURE: Aorta is normal in caliber. There are atherosclerotic calcifications of the aorta. ABDOMINAL AND PELVIS LYMPH NODES: No  lymphadenopathy. BONES AND SOFT TISSUES: The bones are diffusely osteopenic. Changes are seen throughout the lumbar spine. Mild subcutaneous edema lateral to both hips. Tiny focus of subcutaneous air in the lower anterior abdominal wall may represent medication injection site. IMPRESSION:  1. Tree-in-bud opacities in the lingula and ill-defined ground glass/nodular opacities in the mid left upper lobe have mildly decreased but have not completely resolved, with no new focal lung infiltrate. 2. No acute findings in abdomen or pelvis. 3. Sigmoid colon diverticulosis without evidence of diverticulitis. Electronically signed by: Greig Pique MD MD 05/23/2024 07:43 PM EST RP Workstation: [id number]        Scheduled Meds:  amLODipine   2.5 mg Oral Daily   ascorbic acid   500 mg Oral Daily   budesonide   0.5 mg Nebulization BID   Chlorhexidine  Gluconate Cloth  6 each Topical Daily   chlorpheniramine-HYDROcodone   5 mL Oral Q12H   donepezil   5 mg Oral QHS   ezetimibe   10 mg Oral QHS   And   simvastatin   20 mg Oral QHS   feeding supplement  237 mL Oral BID BM   fluconazole   50 mg Oral Daily   guaiFENesin   10 mL Oral Q8H   insulin  aspart  0-20 Units Subcutaneous TID WC   insulin  aspart  0-5 Units Subcutaneous QHS   insulin  aspart  5 Units Subcutaneous TID WC   insulin  glargine-yfgn  30 Units Subcutaneous QHS   ipratropium-albuterol   3 mL Nebulization Q6H   lactobacillus  1 g Oral TID WC   latanoprost   1 drop Both Eyes QHS   levothyroxine   50 mcg Oral See admin instructions   magnesium  oxide  400 mg Oral Daily   metoprolol  succinate  37.5 mg Oral Q12H   multivitamin with minerals  1 tablet Oral Daily   pantoprazole   40 mg Oral Daily   polyethylene glycol  17 g Oral BID   sodium chloride  flush  3 mL Intravenous Q12H   sodium chloride  flush  3 mL Intravenous Q12H   thiamine   100 mg Oral Daily   zinc  sulfate (50mg  elemental zinc )  220 mg Oral Daily   Continuous Infusions:   ceFAZolin  (ANCEF ) IV 2  g (05/25/24 0355)   heparin  550 Units/hr (05/25/24 0351)   lactated ringers  50 mL/hr at 05/25/24 1515   sodium PHOSPHATE  IVPB (in mmol) 15 mmol (05/25/24 1517)     LOS: 7 days    Time spent: over 30 min    Meliton Monte, MD [facility]   To contact the attending provider between 7A-7P or the covering provider during after hours 7P-7A, please log into the web site [email or web address] and access using universal [place] password for that web site. If you do not have the password, please call the hospital operator.  05/25/2024, 4:03 PM    "

## 2024-05-25 NOTE — Progress Notes (Signed)
 Dr. Janese regarding HR sustaining high 140's while pt experiencing BLE pain (Pt's daughter concerned with blood clot traveling). Gave pain meds and BP/heart meds, EKG ordered. Pt HR later WNL. Pt appeared ill-looking, displaying signs of SOB. RT shortly arrived to give tx, increased O2 to 2L 100% saturation. Later observed pt sleeping.   Noticed pt developed lesions on buttocks and sacrum. Daughter stated lesions were not present as of this past Tues. Uploaded photo and Dr. Janese. Pt also developed BUE edema and small knot behind PICC on other side of arm. Dr. Janese and asked if possible scan would be needed. No new orders at this time.   Pt later complained of itching on chest and back. Dr. Janese. See new orders.   Pt's daughter later expressed the need of higher level of concern regarding per above. Dr. Schuyler transfer orders.

## 2024-05-25 NOTE — Telephone Encounter (Signed)
 Pharmacy Patient Advocate Encounter  Insurance verification completed.    The patient is insured through [facility]. Patient has Medicare and is not eligible for a copay card, but may be able to apply for patient assistance or Medicare RX Payment Plan (Patient Must reach out to their plan, if eligible for payment plan), if available.    Ran test claim for Xarelto 20mg  tablet and the current 30 day co-pay is $4.90.  Ran test claim for Eliquis 5mg  tablet and the current 30 day co-pay is $4.90.  This test claim was processed through [facility]- copay amounts may vary at other pharmacies due to pharmacy/plan contracts, or as the patient moves through the different stages of their insurance plan.

## 2024-05-25 NOTE — Progress Notes (Signed)
 PHARMACY - ANTICOAGULATION  Pharmacy Consult for IV Heparin  Indication: DVT Brief A/P: Heparin  level supratherapeutic Decrease Heparin  rate  Allergies[1]  Patient Measurements: Height: 5' (152.4 cm) Weight: 40.3 kg (88 lb 13.5 oz) IBW/kg (Calculated) : 45.5 HEPARIN  DW (KG): 41.5  Vital Signs: Temp: 97.9 F (36.6 C) (01/09 0019) BP: 129/67 (01/09 0019) Pulse Rate: 87 (01/09 0019)  Labs: Recent Labs    05/23/24 0228 05/23/24 0233 05/24/24 0853 05/25/24 0245  HGB 9.8*  --  10.4* 9.0*  HCT 29.7*  --  31.3* 28.3*  PLT 311  --  340 290  HEPARINUNFRC  --   --   --  >1.10*  CREATININE  --  0.92 0.95 0.88    Estimated Creatinine Clearance: 27.6 mL/min (by C-G formula based on SCr of 0.88 mg/dL).   Assessment: 89 y.o. female with femoral vein DVT on LE dopplers for heparin   Goal of Therapy:  Heparin  level 0.3-0.7 units/ml Monitor platelets by anticoagulation protocol: Yes   Plan:  Decrease Heparin  550 units/hr Check heparin  level in 8 hours.  Cathlyn Arrant, PharmD, BCPS  05/25/2024,3:38 AM       [1]  Allergies Allergen Reactions   Penicillins Rash and Other (See Comments)    1980's   Sulfa Antibiotics Rash

## 2024-05-26 ENCOUNTER — Inpatient Hospital Stay (HOSPITAL_COMMUNITY)

## 2024-05-26 ENCOUNTER — Other Ambulatory Visit (HOSPITAL_COMMUNITY): Payer: Self-pay

## 2024-05-26 DIAGNOSIS — A419 Sepsis, unspecified organism: Secondary | ICD-10-CM | POA: Diagnosis not present

## 2024-05-26 DIAGNOSIS — J189 Pneumonia, unspecified organism: Secondary | ICD-10-CM | POA: Diagnosis not present

## 2024-05-26 LAB — GLUCOSE, CAPILLARY
Glucose-Capillary: 151 mg/dL — ABNORMAL HIGH (ref 70–99)
Glucose-Capillary: 143 mg/dL — ABNORMAL HIGH (ref 70–99)
Glucose-Capillary: 206 mg/dL — ABNORMAL HIGH (ref 70–99)
Glucose-Capillary: 76 mg/dL (ref 70–99)
Glucose-Capillary: 70 mg/dL (ref 70–99)

## 2024-05-26 LAB — PHOSPHORUS: Phosphorus: 2.2 mg/dL — ABNORMAL LOW (ref 2.5–4.6)

## 2024-05-26 LAB — COMPREHENSIVE METABOLIC PANEL WITH GFR
ALT: 10 U/L (ref 0–44)
AST: 33 U/L (ref 15–41)
Albumin: 2.5 g/dL — ABNORMAL LOW (ref 3.5–5.0)
Alkaline Phosphatase: 189 U/L — ABNORMAL HIGH (ref 38–126)
Anion gap: 5 (ref 5–15)
BUN: 19 mg/dL (ref 8–23)
CO2: 28 mmol/L (ref 22–32)
Calcium: 8.2 mg/dL — ABNORMAL LOW (ref 8.9–10.3)
Chloride: 100 mmol/L (ref 98–111)
Creatinine, Ser: 0.78 mg/dL (ref 0.44–1.00)
GFR, Estimated: >60 mL/min
Glucose, Bld: 123 mg/dL — ABNORMAL HIGH (ref 70–99)
Potassium: 5 mmol/L (ref 3.5–5.1)
Sodium: 133 mmol/L — ABNORMAL LOW (ref 135–145)
Total Bilirubin: 0.3 mg/dL (ref 0.0–1.2)
Total Protein: 4.5 g/dL — ABNORMAL LOW (ref 6.5–8.1)

## 2024-05-26 LAB — IRON AND TIBC
Iron: 11 ug/dL — ABNORMAL LOW (ref 28–170)
Saturation Ratios: 5 % — ABNORMAL LOW (ref 10.4–31.8)
TIBC: 214 ug/dL — ABNORMAL LOW (ref 250–450)
UIBC: 203 ug/dL

## 2024-05-26 LAB — HEMOGLOBIN AND HEMATOCRIT, BLOOD
HCT: 22.8 % — ABNORMAL LOW (ref 36.0–46.0)
HCT: 21.8 % — ABNORMAL LOW (ref 36.0–46.0)
Hemoglobin: 7.6 g/dL — ABNORMAL LOW (ref 12.0–15.0)
Hemoglobin: 7 g/dL — ABNORMAL LOW (ref 12.0–15.0)

## 2024-05-26 LAB — MRSA NEXT GEN BY PCR, NASAL: MRSA by PCR Next Gen: NOT DETECTED

## 2024-05-26 LAB — ABO/RH: ABO/RH(D): A POS

## 2024-05-26 LAB — FERRITIN: Ferritin: 984 ng/mL — ABNORMAL HIGH (ref 11–307)

## 2024-05-26 LAB — CBC
HCT: 22.9 % — ABNORMAL LOW (ref 36.0–46.0)
Hemoglobin: 7.4 g/dL — ABNORMAL LOW (ref 12.0–15.0)
MCH: 29 pg (ref 26.0–34.0)
MCHC: 32.3 g/dL (ref 30.0–36.0)
MCV: 89.8 fL (ref 80.0–100.0)
Platelets: 240 K/uL (ref 150–400)
RBC: 2.55 MIL/uL — ABNORMAL LOW (ref 3.87–5.11)
RDW: 17.7 % — ABNORMAL HIGH (ref 11.5–15.5)
WBC: 23.7 K/uL — ABNORMAL HIGH (ref 4.0–10.5)
nRBC: 1 % — ABNORMAL HIGH (ref 0.0–0.2)

## 2024-05-26 LAB — AMMONIA: Ammonia: 24 umol/L (ref 9–35)

## 2024-05-26 LAB — CULTURE, OB URINE: Culture: NO GROWTH

## 2024-05-26 LAB — PREPARE RBC (CROSSMATCH)

## 2024-05-26 LAB — MAGNESIUM: Magnesium: 1.9 mg/dL (ref 1.7–2.4)

## 2024-05-26 LAB — HEPARIN LEVEL (UNFRACTIONATED): Heparin Unfractionated: 1.03 [IU]/mL — ABNORMAL HIGH (ref 0.30–0.70)

## 2024-05-26 LAB — FOLATE: Folate: 9.2 ng/mL

## 2024-05-26 LAB — PRO BRAIN NATRIURETIC PEPTIDE: Pro Brain Natriuretic Peptide: 692 pg/mL — ABNORMAL HIGH

## 2024-05-26 LAB — VITAMIN B12: Vitamin B-12: 907 pg/mL (ref 180–914)

## 2024-05-26 MED ORDER — HEPARIN (PORCINE) 25000 UT/250ML-% IV SOLN
300.0000 [IU]/h | INTRAVENOUS | Status: DC
  Administered 2024-05-26: 300 [IU]/h via INTRAVENOUS

## 2024-05-26 MED ORDER — FUROSEMIDE 10 MG/ML IJ SOLN
40.0000 mg | Freq: Once | INTRAMUSCULAR | Status: AC
  Administered 2024-05-26: 40 mg via INTRAVENOUS
  Filled 2024-05-26: qty 4

## 2024-05-26 MED ORDER — OXYCODONE HCL 5 MG PO TABS: 2.5000 mg | ORAL_TABLET | ORAL | Status: DC | PRN

## 2024-05-26 MED ORDER — SODIUM CHLORIDE 0.9% IV SOLUTION: Freq: Once | INTRAVENOUS | Status: AC

## 2024-05-26 MED ORDER — INSULIN GLARGINE-YFGN 100 UNIT/ML ~~LOC~~ SOLN
20.0000 [IU] | Freq: Every day | SUBCUTANEOUS | Status: DC
  Administered 2024-05-27: 20 [IU] via SUBCUTANEOUS
  Filled 2024-05-26 (×4): qty 0.2

## 2024-05-26 MED ORDER — SODIUM CHLORIDE 0.9 % IV SOLN
2.0000 g | INTRAVENOUS | Status: DC
  Administered 2024-05-26: 2 g via INTRAVENOUS
  Filled 2024-05-26: qty 20

## 2024-05-26 MED ORDER — OXYCODONE HCL 5 MG PO TABS
5.0000 mg | ORAL_TABLET | ORAL | Status: DC | PRN
  Administered 2024-05-26: 5 mg via ORAL
  Filled 2024-05-26: qty 1

## 2024-05-26 MED ORDER — DEXTROSE 50 % IV SOLN
12.5000 g | INTRAVENOUS | Status: AC
  Administered 2024-05-26: 12.5 g via INTRAVENOUS
  Filled 2024-05-26: qty 50

## 2024-05-26 MED ORDER — SODIUM CHLORIDE 0.9 % IV SOLN
2.0000 g | Freq: Two times a day (BID) | INTRAVENOUS | Status: DC
  Administered 2024-05-26 – 2024-05-27 (×2): 2 g via INTRAVENOUS
  Filled 2024-05-26 (×2): qty 12.5

## 2024-05-26 MED ORDER — IOHEXOL 350 MG/ML SOLN
75.0000 mL | Freq: Once | INTRAVENOUS | Status: AC | PRN
  Administered 2024-05-26: 75 mL via INTRAVENOUS

## 2024-05-26 MED ORDER — DEXTROSE IN LACTATED RINGERS 5 % IV SOLN: INTRAVENOUS | Status: DC

## 2024-05-26 NOTE — Progress Notes (Addendum)
 " PROGRESS NOTE    [name]  FMW:[id number] DOB: 11-09-1932 DOA: 05/17/2024 PCP: Sun, Vyvyan, MD  Chief Complaint  Patient presents with   Shortness of Breath   Altered Mental Status    Brief Narrative:   89 year old female with past medical history significant for HFpEF, hypertension, COPD, hypothyroidism, chronic kidney disease stage III and type 2 diabetes mellitus who presented with worsening shortness of breath and edema.   She's been diagnosed with influenza, MSSA pneumonia, and MSSA bacteremia.    She's been seen by ID.  Cardiology was c/s for SVT.    Assessment & Plan:   Principal Problem:   Sepsis due to pneumonia [facility]) Active Problems:   Acute hypoxic respiratory failure (HCC)   Influenza [name] with pneumonia   Chronic obstructive asthma (HCC)   Hypertension   Insulin  dependent type 2 diabetes mellitus (HCC)   CKD stage 3a, GFR 45-59 ml/min (HCC)   Iron  deficiency anemia   UTI (urinary tract infection)   Hypothyroidism   Protein-calorie malnutrition, severe  Addendum 7:37 PM Hb downtrending, daughter Josetta concerned regarding pain today.  CT CAP showed multifocal pneumonia, hyperdense collection concerning for hematoma posterior to sacrum.  Broaden to cefepime .  Hold anticoagulation.  Requiring 7 L on return from CT, ? Aspiration while lying flat.  Continue supportive care, give lasix .  Getting 1 unit pRBC for downtrending H/H.  Reviewed poc with [facility]  Asked night coverage to check on her.   Goals of Care Appreciate palliative following.  Hopeful for improvement.  Oldest daughter [place] in the next day or so.    Acute hypoxic respiratory failure Influenza [name] with MSSA pneumonia MSSA bacteremia: CT 1/1 with ground glass nodular densities within tree in bud appearance primarily in the lingula and to Luevenia Mcavoy lesser  degree in the LLL 1/1 blood culture with staph aureus and abiotrophia defectiva (suspected contaminant, but follow follow up culture) 1/3  blood culture NGTDx4 Appreciate ID recommendations - continue ancef  while inpatient with goal of 7 days of IV abx and then transition to linezolid  600 mg PO BID with total of 4 weeks of treatment through 06/16/2024. Tamiflu  1/2-1/6 Appreciate ID recs - recommending continue cefazolin  while inpatient (goal at least 7 days IV abx) and transition to linezolid  600 mg PO bid with total of 4 weeks of treatment through 06/16/2024.   CT CAP 1/7 without acute findings in abdomen or pelvis, tree in bud opacities in lingula and ill defined ground glass/nodular opacities in the mid left upper lobe have decreased, but not resolved.   TTE without notable valvular abnormalities - no plan for TEE leukocytosis in setting of steroid use.  D/c'd 1/7 - leukocytosis was improving 1/9, but up to 23 today.   CXR 1/9 with bibasilar patchy airspace opacities and trace effusions - will transition to ceftriaxone  to cover for aspiration pneumonia and monitor (discussed with pharmacy, short course if possible).    Acute Metabolic Encephalopathy Dementia VBG without hypercarbia B12, ammonia, folate all normal Suspect delirium - waxing and waning throughout today Minimize deliriogenic meds I think head CT likely unlikely to change management - will consider based on clinical course  Anemia Drop from 9 -> 7.4 today Repeat labs + type and screen Normal b12, folate.  Iron , ferritin.  No si/sx bleeding - if true decline, needs additional w/u.  Left Lower Extremity DVT On 1/8 US  Heparin  gtt Based on story, worse LE edema which improved, it would not be surprising if she  had PE, but our management would be unlikely to change (already on anticoagulation).  Discussed with daughter.  We'll consider CT PE study in coming days, but would like her resp status to improve (lots of upper airway sounds, risk of aspiration).    Bilateral LE Pain Described as cramping, unclear cause on 1/9 Seems resolved today palpable bilateral  pulses.  No notable masses or visible abnormalities.  Compartments soft.   Intermittent SVT: Short intermittent runs increased metop to 37.5 mg BID - will monitor Appreciate cardiology assistance - they've signed off 1/7 (see note).  Replace lytes to maintain K>4 and mag >2.  Hypertension Amlodipine  BP ok   Concern for Poor UOP Urinary Retention Now s/p foley placement 1/9 1 L out yesterday Oxybutynin  on hold 1/9 Consider trial of void in [name] few days  Possible UTI: No urine culture collected UA at presentation with +LE/nitrites - watch for UTI symptoms  Concern for Possible Dysphagia Thrush SLP eval, appreciate assistance fluconazole  Patient has history of oral cancer.  HFpEF Echo 05/18/2024 revealed normal left ventricular ejection fraction, with grade 1 diastolic dysfunction. Appears euvolemic to dry Cardiology now signed of 1/7.   Will diurese as needed   AKI Aki from presentation has improved  Left 2nd Toe Injury Plain films unremarkable Supportive care  Dementia: Delirium precautions Continue Aricept .   T2DM Steroid induced hyperglycemia Basal, bolus, SSI - adjust as needed Stop steroids Monitor  Loose watery diarrhea: + c diff antigen, negative PCR.  C diff ruled out with ID assistance, appreciate assistance - suspected abx associated diarrhea   Hypokalemia Hypomagnesemia Hypophosphatemia Follow and replace as needed   Poor PO intake Severe Protein Calorie Malnutrition RD Hold megace  - should probably be discontinued indefinitely based on VTE above Body mass index is 17.61 kg/m.  Heat Rash Suspect this is rash on her bottom Will monitor, supportive care  Concern regarding frequent blood draws.  S/p PICC placement 1/8    DVT prophylaxis: heparin  Code Status: full Family Communication: none Disposition:   Status is: Inpatient Remains inpatient appropriate because: need for continued inpatient care   Consultants:  Palliative  care cardiology  Procedures:  Echo IMPRESSIONS     1. Left ventricular ejection fraction, by estimation, is 60 to 65%. The  left ventricle has normal function. The left ventricle has no regional  wall motion abnormalities. Left ventricular diastolic parameters are  consistent with Grade I diastolic  dysfunction (impaired relaxation).   2. Right ventricular systolic function is normal. The right ventricular  size is normal.   3. The mitral valve is normal in structure. Mild mitral valve  regurgitation. No evidence of mitral stenosis.   4. The aortic valve is tricuspid. Aortic valve regurgitation is not  visualized. No aortic stenosis is present.   5. The inferior vena cava is normal in size with greater than 50%  respiratory variability, suggesting right atrial pressure of 3 mmHg.   Antimicrobials:  Anti-infectives (From admission, onward)    Start     Dose/Rate Route Frequency Ordered Stop   05/26/24 0915  cefTRIAXone  (ROCEPHIN ) 2 g in sodium chloride  0.9 % 100 mL IVPB        2 g 200 mL/hr over 30 Minutes Intravenous Every 24 hours 05/26/24 0822     05/25/24 1000  fluconazole  (DIFLUCAN ) tablet 50 mg       Placed in Followed by Linked Group   50 mg Oral Daily 05/24/24 1049 06/03/24 0959   05/24/24 1145  fluconazole  (DIFLUCAN )  tablet 100 mg       Placed in Followed by Linked Group   100 mg Oral  Once 05/24/24 1049 05/24/24 1234   05/24/24 0000  linezolid  (ZYVOX ) 600 MG tablet        600 mg Oral 2 times daily 05/22/24 1502 06/14/24 2359   05/21/24 1230  fluconazole  (DIFLUCAN ) tablet 50 mg  Status:  Discontinued        50 mg Oral Daily 05/21/24 1140 05/21/24 1432   05/18/24 1829  oseltamivir  (TAMIFLU ) capsule 30 mg        30 mg Oral Daily 05/18/24 1829 05/22/24 0941   05/18/24 1545  ceFAZolin  (ANCEF ) IVPB 2g/100 mL premix  Status:  Discontinued        2 g 200 mL/hr over 30 Minutes Intravenous Every 12 hours 05/18/24 1451 05/26/24 0822   05/18/24 1415  azithromycin   (ZITHROMAX ) 500 mg in sodium chloride  0.9 % 250 mL IVPB  Status:  Discontinued        500 mg 250 mL/hr over 60 Minutes Intravenous  Once 05/18/24 1326 05/18/24 1332   05/18/24 1415  cefTRIAXone  (ROCEPHIN ) 1 g in sodium chloride  0.9 % 100 mL IVPB  Status:  Discontinued        1 g 200 mL/hr over 30 Minutes Intravenous Every 24 hours 05/18/24 1326 05/18/24 1334   05/17/24 2200  oseltamivir  (TAMIFLU ) capsule 30 mg  Status:  Discontinued        30 mg Oral 2 times daily 05/17/24 2025 05/18/24 1829   05/17/24 2100  ceFEPIme  (MAXIPIME ) 2 g in sodium chloride  0.9 % 100 mL IVPB  Status:  Discontinued        2 g 200 mL/hr over 30 Minutes Intravenous Every 24 hours 05/17/24 2057 05/18/24 1451   05/17/24 2100  vancomycin  (VANCOCIN ) IVPB 1000 mg/200 mL premix        1,000 mg 200 mL/hr over 60 Minutes Intravenous  Once 05/17/24 2057 05/17/24 2324   05/17/24 2056  vancomycin  variable dose per unstable renal function (pharmacist dosing)  Status:  Discontinued         Does not apply See admin instructions 05/17/24 2057 05/18/24 1451   05/17/24 1900  cefTRIAXone  (ROCEPHIN ) 1 g in sodium chloride  0.9 % 100 mL IVPB        1 g 200 mL/hr over 30 Minutes Intravenous  Once 05/17/24 1849 05/17/24 1940   05/17/24 1900  azithromycin  (ZITHROMAX ) 500 mg in sodium chloride  0.9 % 250 mL IVPB        500 mg 250 mL/hr over 60 Minutes Intravenous  Once 05/17/24 1849 05/17/24 2115       Subjective:  Daughter at bedside [facility]) Some agitation after transfer, but since then, things going ok. Still with breathing issues  Did ok with some breakfast this AM  Objective: Vitals:   05/26/24 0007 05/26/24 0214 05/26/24 0346 05/26/24 0858  BP: (!) 126/56  (!) 113/51 (!) 114/55  Pulse: 98 89 96 89  Resp: 16 20 19 14   Temp: 98 F (36.7 C)  (!) 97.3 F (36.3 C) 97.9 F (36.6 C)  TempSrc: Oral  Oral Oral  SpO2: 100% 100% 100% 100%  Weight:   40.9 kg   Height:        Intake/Output Summary (Last 24 hours) at 05/26/2024  0900 Last data filed at 05/26/2024 9374 Gross per 24 hour  Intake --  Output 1000 ml  Net -1000 ml   Filed Weights   05/24/24 0415 05/25/24  0407 05/26/24 0346  Weight: 40.3 kg 46.3 kg 40.9 kg    Examination:  General: No acute distress. Cardiovascular: RRR Lungs: diffuse rhonchi, transmitted upper airway sounds on 2 L Abdomen: distended, nontender Neurological: Alert, but tired appearing.  Moving all extremities. Extremities: easily palpable DP pulses, no LEE.     Data Reviewed: I have personally reviewed following labs and imaging studies  CBC: Recent Labs  Lab 05/21/24 0228 05/23/24 0228 05/24/24 0853 05/25/24 0245 05/26/24 0210  WBC 16.0* 20.9* 27.3* 20.8* 23.7*  NEUTROABS 12.3*  --  19.0*  --   --   HGB 10.2* 9.8* 10.4* 9.0* 7.4*  HCT 30.9* 29.7* 31.3* 28.3* 22.9*  MCV 87.8 88.1 88.2 92.2 89.8  PLT 290 311 340 290 240    Basic Metabolic Panel: Recent Labs  Lab 05/22/24 0926 05/23/24 0228 05/23/24 0233 05/24/24 0853 05/25/24 0245 05/26/24 0210  NA 139  --  140 134* 137 133*  K 3.9  --  4.3 4.9 4.8 5.0  CL 101  --  105 101 104 100  CO2 23  --  24 24 28 28   GLUCOSE 316*  --  145* 168* 80 123*  BUN 18  --  19 18 20 19   CREATININE 1.06*  --  0.92 0.95 0.88 0.78  CALCIUM  8.1*  --  8.0* 8.3* 8.3* 8.2*  MG 2.3 2.2  --  2.3 2.1 1.9  PHOS 2.6  --  2.2* 1.8* 1.3* 2.2*    GFR: Estimated Creatinine Clearance: 30.8 mL/min (by C-G formula based on SCr of 0.78 mg/dL).  Liver Function Tests: Recent Labs  Lab 05/22/24 0926 05/23/24 0233 05/24/24 0853 05/25/24 0245 05/26/24 0210  AST  --   --  45* 34 33  ALT  --   --  18 15 10   ALKPHOS  --   --  46 52 189*  BILITOT  --   --  0.3 0.3 0.3  PROT  --   --  5.4* 4.8* 4.5*  ALBUMIN 3.3* 3.1* 3.2* 2.7* 2.5*    CBG: Recent Labs  Lab 05/25/24 0810 05/25/24 1210 05/25/24 1812 05/25/24 2205 05/26/24 0634  GLUCAP 190* 125* 138* 88 151*     Recent Results (from the past 240 hours)  Resp panel by RT-PCR  (RSV, Flu Macai Sisneros&B, Covid) Anterior Nasal Swab     Status: Abnormal   Collection Time: 05/17/24  5:55 PM   Specimen: Anterior Nasal Swab  Result Value Ref Range Status   SARS Coronavirus 2 by RT PCR NEGATIVE NEGATIVE Final    Comment: (NOTE) SARS-CoV-2 target nucleic acids are NOT DETECTED.  The SARS-CoV-2 RNA is generally detectable in upper respiratory specimens during the acute phase of infection. The lowest concentration of SARS-CoV-2 viral copies this assay can detect is 138 copies/mL. Leatrice Parilla negative result does not preclude SARS-Cov-2 infection and should not be used as the sole basis for treatment or other patient management decisions. Allysia Ingles negative result may occur with  improper specimen collection/handling, submission of specimen other than nasopharyngeal swab, presence of viral mutation(s) within the areas targeted by this assay, and inadequate number of viral copies(<138 copies/mL). Rosalin Buster negative result must be combined with clinical observations, patient history, and epidemiological information. The expected result is Negative.  Fact Sheet for Patients:  bloggercourse.com  Fact Sheet for Healthcare Providers:  seriousbroker.it  This test is no t yet approved or cleared by the [place]  FDA and  has been authorized for detection and/or diagnosis of SARS-CoV-2 by FDA  under an Emergency Use Authorization (EUA). This EUA will remain  in effect (meaning this test can be used) for the duration of the COVID-19 declaration under Section 564(b)(1) of the Act, 21 U.S.C.section 360bbb-3(b)(1), unless the authorization is terminated  or revoked sooner.       Influenza Lyndsay Talamante by PCR POSITIVE (Yuniel Blaney) NEGATIVE Final   Influenza B by PCR NEGATIVE NEGATIVE Final    Comment: (NOTE) The Xpert Xpress SARS-CoV-2/FLU/RSV plus assay is intended as an aid in the diagnosis of influenza from Nasopharyngeal swab specimens and should not be used as Eagan Shifflett sole basis  for treatment. Nasal washings and aspirates are unacceptable for Xpert Xpress SARS-CoV-2/FLU/RSV testing.  Fact Sheet for Patients: bloggercourse.com  Fact Sheet for Healthcare Providers: seriousbroker.it  This test is not yet approved or cleared by the [place]  FDA and has been authorized for detection and/or diagnosis of SARS-CoV-2 by FDA under an Emergency Use Authorization (EUA). This EUA will remain in effect (meaning this test can be used) for the duration of the COVID-19 declaration under Section 564(b)(1) of the Act, 21 U.S.C. section 360bbb-3(b)(1), unless the authorization is terminated or revoked.     Resp Syncytial Virus by PCR NEGATIVE NEGATIVE Final    Comment: (NOTE) Fact Sheet for Patients: bloggercourse.com  Fact Sheet for Healthcare Providers: seriousbroker.it  This test is not yet approved or cleared by the [place]  FDA and has been authorized for detection and/or diagnosis of SARS-CoV-2 by FDA under an Emergency Use Authorization (EUA). This EUA will remain in effect (meaning this test can be used) for the duration of the COVID-19 declaration under Section 564(b)(1) of the Act, 21 U.S.C. section 360bbb-3(b)(1), unless the authorization is terminated or revoked.  Performed at [facility], [address], [place], [place] [address]   Culture, blood (single)     Status: Abnormal   Collection Time: 05/17/24  7:15 PM   Specimen: BLOOD LEFT FOREARM  Result Value Ref Range Status   Specimen Description   Final    BLOOD LEFT FOREARM Performed at [facility], [address] [place], [place] [address]    Special Requests   Final    BOTTLES DRAWN AEROBIC AND ANAEROBIC Blood Culture adequate volume Performed at [facility], [address] [place], [place] [address]    Culture  Setup Time   Final     GRAM POSITIVE COCCI IN CLUSTERS AEROBIC BOTTLE ONLY CRITICAL RESULT CALLED TO, READ BACK BY AND VERIFIED WITH: PHARMD JEREMY F [id number] FCP GRAM POSITIVE COCCI IN CHAINS ANAEROBIC BOTTLE ONLY RBV JESICCA CARNIE PHARMD 05/19/2024 BY DD @ 0134    Culture (Nautica Hotz)  Final    STAPHYLOCOCCUS AUREUS ABIOTROPHIA DEFECTIVA Standardized susceptibility testing for this organism is not available. Performed at [facility], [address], [place], [place] [address]    Report Status 05/20/2024 FINAL  Final   Organism ID, Bacteria STAPHYLOCOCCUS AUREUS  Final      Susceptibility   Staphylococcus aureus - MIC*    CIPROFLOXACIN  >=8 RESISTANT Resistant     ERYTHROMYCIN <=0.25 SENSITIVE Sensitive     GENTAMICIN <=0.5 SENSITIVE Sensitive     OXACILLIN 0.5 SENSITIVE Sensitive     TETRACYCLINE <=1 SENSITIVE Sensitive     VANCOMYCIN  1 SENSITIVE Sensitive     TRIMETH/SULFA <=10 SENSITIVE Sensitive     CLINDAMYCIN <=0.25 SENSITIVE Sensitive     RIFAMPIN <=0.5 SENSITIVE Sensitive     Inducible Clindamycin NEGATIVE Sensitive     LINEZOLID   2 SENSITIVE Sensitive     * STAPHYLOCOCCUS AUREUS  Blood Culture ID Panel (Reflexed)     Status: Abnormal   Collection Time: 05/17/24  7:15 PM  Result Value Ref Range Status   Enterococcus faecalis NOT DETECTED NOT DETECTED Final   Enterococcus Faecium NOT DETECTED NOT DETECTED Final   Listeria monocytogenes NOT DETECTED NOT DETECTED Final   Staphylococcus species DETECTED (Keiasha Diep) NOT DETECTED Final    Comment: CRITICAL RESULT CALLED TO, READ BACK BY AND VERIFIED WITH: PHARMD JEREMY F [id number] FCP    Staphylococcus aureus (BCID) DETECTED (Shonda Mandarino) NOT DETECTED Final    Comment: CRITICAL RESULT CALLED TO, READ BACK BY AND VERIFIED WITH: PHARMD JEREMY F [id number] FCP    Staphylococcus epidermidis NOT DETECTED NOT DETECTED Final   Staphylococcus lugdunensis NOT DETECTED NOT DETECTED Final   Streptococcus species NOT DETECTED NOT DETECTED Final   Streptococcus  agalactiae NOT DETECTED NOT DETECTED Final   Streptococcus pneumoniae NOT DETECTED NOT DETECTED Final   Streptococcus pyogenes NOT DETECTED NOT DETECTED Final   Green Quincy.calcoaceticus-baumannii NOT DETECTED NOT DETECTED Final   Bacteroides fragilis NOT DETECTED NOT DETECTED Final   Enterobacterales NOT DETECTED NOT DETECTED Final   Enterobacter cloacae complex NOT DETECTED NOT DETECTED Final   Escherichia coli NOT DETECTED NOT DETECTED Final   Klebsiella aerogenes NOT DETECTED NOT DETECTED Final   Klebsiella oxytoca NOT DETECTED NOT DETECTED Final   Klebsiella pneumoniae NOT DETECTED NOT DETECTED Final   Proteus species NOT DETECTED NOT DETECTED Final   Salmonella species NOT DETECTED NOT DETECTED Final   Serratia marcescens NOT DETECTED NOT DETECTED Final   Haemophilus influenzae NOT DETECTED NOT DETECTED Final   Neisseria meningitidis NOT DETECTED NOT DETECTED Final   Pseudomonas aeruginosa NOT DETECTED NOT DETECTED Final   Stenotrophomonas maltophilia NOT DETECTED NOT DETECTED Final   Candida albicans NOT DETECTED NOT DETECTED Final   Candida auris NOT DETECTED NOT DETECTED Final   Candida glabrata NOT DETECTED NOT DETECTED Final   Candida krusei NOT DETECTED NOT DETECTED Final   Candida parapsilosis NOT DETECTED NOT DETECTED Final   Candida tropicalis NOT DETECTED NOT DETECTED Final   Cryptococcus neoformans/gattii NOT DETECTED NOT DETECTED Final   Meth resistant mecA/C and MREJ NOT DETECTED NOT DETECTED Final    Comment: Performed at [facility], [address], [place], [place] [address]  Blood Culture ID Panel (Reflexed)     Status: None   Collection Time: 05/18/24  7:15 PM  Result Value Ref Range Status   Enterococcus faecalis NOT DETECTED NOT DETECTED Final   Enterococcus Faecium NOT DETECTED NOT DETECTED Final   Listeria monocytogenes NOT DETECTED NOT DETECTED Final   Staphylococcus species NOT DETECTED NOT DETECTED Final   Staphylococcus aureus (BCID) NOT DETECTED NOT  DETECTED Final   Staphylococcus epidermidis NOT DETECTED NOT DETECTED Final   Staphylococcus lugdunensis NOT DETECTED NOT DETECTED Final   Streptococcus species NOT DETECTED NOT DETECTED Final   Streptococcus agalactiae NOT DETECTED NOT DETECTED Final   Streptococcus pneumoniae NOT DETECTED NOT DETECTED Final   Streptococcus pyogenes NOT DETECTED NOT DETECTED Final   Rahman Ferrall.calcoaceticus-baumannii NOT DETECTED NOT DETECTED Final   Bacteroides fragilis NOT DETECTED NOT DETECTED Final   Enterobacterales NOT DETECTED NOT DETECTED Final   Enterobacter cloacae complex NOT DETECTED NOT DETECTED Final   Escherichia coli NOT DETECTED NOT DETECTED Final   Klebsiella aerogenes NOT DETECTED NOT DETECTED Final   Klebsiella oxytoca NOT DETECTED NOT DETECTED Final   Klebsiella pneumoniae NOT DETECTED  NOT DETECTED Final   Proteus species NOT DETECTED NOT DETECTED Final   Salmonella species NOT DETECTED NOT DETECTED Final   Serratia marcescens NOT DETECTED NOT DETECTED Final   Haemophilus influenzae NOT DETECTED NOT DETECTED Final   Neisseria meningitidis NOT DETECTED NOT DETECTED Final   Pseudomonas aeruginosa NOT DETECTED NOT DETECTED Final   Stenotrophomonas maltophilia NOT DETECTED NOT DETECTED Final   Candida albicans NOT DETECTED NOT DETECTED Final   Candida auris NOT DETECTED NOT DETECTED Final   Candida glabrata NOT DETECTED NOT DETECTED Final   Candida krusei NOT DETECTED NOT DETECTED Final   Candida parapsilosis NOT DETECTED NOT DETECTED Final   Candida tropicalis NOT DETECTED NOT DETECTED Final   Cryptococcus neoformans/gattii NOT DETECTED NOT DETECTED Final    Comment: Performed at [facility], [address] [place], [place] [address]  Culture, blood (Routine X 2) w Reflex to ID Panel     Status: None   Collection Time: 05/19/24  9:04 AM   Specimen: BLOOD LEFT ARM  Result Value Ref Range Status   Specimen Description BLOOD LEFT ARM  Final   Special Requests   Final    BOTTLES  DRAWN AEROBIC AND ANAEROBIC Blood Culture adequate volume   Culture   Final    NO GROWTH 5 DAYS Performed at [facility], [address] [place], [place] [address]    Report Status 05/24/2024 FINAL  Final  Culture, blood (Routine X 2) w Reflex to ID Panel     Status: None   Collection Time: 05/19/24  9:12 AM   Specimen: BLOOD LEFT HAND  Result Value Ref Range Status   Specimen Description BLOOD LEFT HAND  Final   Special Requests   Final    BOTTLES DRAWN AEROBIC AND ANAEROBIC Blood Culture results may not be optimal due to an inadequate volume of blood received in culture bottles   Culture   Final    NO GROWTH 5 DAYS Performed at [facility], [address], [place], [place] [address]    Report Status 05/24/2024 FINAL  Final  C Difficile Quick Screen w PCR reflex     Status: Abnormal   Collection Time: 05/19/24  8:29 PM   Specimen: STOOL  Result Value Ref Range Status   C Diff antigen POSITIVE (Raihan Kimmel) NEGATIVE Final   C Diff toxin NEGATIVE NEGATIVE Final   C Diff interpretation Results are indeterminate. See PCR results.  Final    Comment: Performed at [facility], [address] [place], [place] [address]  C. Diff by PCR, Reflexed     Status: None   Collection Time: 05/19/24  8:29 PM  Result Value Ref Range Status   Toxigenic C. Difficile by PCR NEGATIVE NEGATIVE Final    Comment: Patient is colonized with non toxigenic C. difficile. May not need treatment unless significant symptoms are present.   Hypervirulent Strain PRESUMPTIVE NEGATIVE PRESUMPTIVE NEGATIVE Final    Comment: Performed at [facility], [address] [place], [place] [address]  Gastrointestinal Panel by PCR , Stool     Status: None   Collection Time: 05/20/24 12:10 AM   Specimen: Stool  Result Value Ref Range Status   Campylobacter species NOT DETECTED NOT DETECTED Final   Plesimonas shigelloides NOT DETECTED NOT DETECTED Final   Salmonella species NOT DETECTED NOT DETECTED Final    Yersinia enterocolitica NOT DETECTED NOT DETECTED Final   Vibrio species NOT DETECTED NOT DETECTED Final  Vibrio cholerae NOT DETECTED NOT DETECTED Final   Enteroaggregative E coli (EAEC) NOT DETECTED NOT DETECTED Final   Enteropathogenic E coli (EPEC) NOT DETECTED NOT DETECTED Final   Enterotoxigenic E coli (ETEC) NOT DETECTED NOT DETECTED Final   Shiga like toxin producing E coli (STEC) NOT DETECTED NOT DETECTED Final   Shigella/Enteroinvasive E coli (EIEC) NOT DETECTED NOT DETECTED Final   Cryptosporidium NOT DETECTED NOT DETECTED Final   Cyclospora cayetanensis NOT DETECTED NOT DETECTED Final   Entamoeba histolytica NOT DETECTED NOT DETECTED Final   Giardia lamblia NOT DETECTED NOT DETECTED Final   Adenovirus F40/41 NOT DETECTED NOT DETECTED Final   Astrovirus NOT DETECTED NOT DETECTED Final   Norovirus GI/GII NOT DETECTED NOT DETECTED Final   Rotavirus Blayze Haen NOT DETECTED NOT DETECTED Final   Sapovirus (I, II, IV, and V) NOT DETECTED NOT DETECTED Final    Comment: Performed at [facility], [address] [place], [place] [address]  Culture, MAINE Urine     Status: None   Collection Time: 05/25/24  1:19 AM   Specimen: Urine, Random  Result Value Ref Range Status   Specimen Description URINE, RANDOM  Final   Special Requests NONE  Final   Culture   Final    NO GROWTH NO GROUP B STREP (S.AGALACTIAE) ISOLATED Performed at [facility], [address] [place], [place] [address]    Report Status 05/26/2024 FINAL  Final         Radiology Studies: DG CHEST PORT 1 VIEW Result Date: 05/25/2024 EXAM: 1 VIEW(S) XRAY OF THE CHEST 05/25/2024 12:32:00 PM COMPARISON: 05/20/2024 CLINICAL HISTORY: Cough FINDINGS: LINES, TUBES AND DEVICES: Right PICC with tip overlying the expected region of the superior cavoatrial junction. LUNGS AND PLEURA: Developing bibasilar patchy airspace opacities. Trace pleural effusions. No pneumothorax. HEART AND MEDIASTINUM: Atherosclerotic plaque. No  acute abnormality of the cardiac and mediastinal silhouettes. BONES AND SOFT TISSUES: No acute osseous abnormality. IMPRESSION: 1. Developing bibasilar patchy airspace opacities and trace pleural effusions. 2. Right PICC tip overlies the expected region of the superior cavoatrial junction. 3. Atherosclerotic vascular calcifications. Electronically signed by: Greig Pique MD MD 05/25/2024 08:22 PM EST RP Workstation: [id number]   VAS US  LOWER EXTREMITY VENOUS (DVT) Result Date: 05/24/2024  Lower Venous DVT Study Patient Name:  Hannah [name]  Date of Exam:   05/24/2024 Medical Rec #: [id number]        Accession #:    [id number] Date of Birth: 1932-11-02        Patient Gender: F Patient Age:   65 years Exam Location:  [facility] Procedure:      VAS US  LOWER EXTREMITY VENOUS (DVT) Referring Phys: Chawn Spraggins POWELL JR --------------------------------------------------------------------------------  Indications: Edema, and SOB. Other Indications: Fatigue, weakness. Comparison Study: No prior exam. Performing Technologist: Edilia Elden Appl  Examination Guidelines: Kaye Mitro complete evaluation includes B-mode imaging, spectral Doppler, color Doppler, and power Doppler as needed of all accessible portions of each vessel. Bilateral testing is considered an integral part of Karma Hiney complete examination. Limited examinations for reoccurring indications may be performed as noted. The reflux portion of the exam is performed with the patient in reverse Trendelenburg.  +-----+---------------+---------+-----------+----------+--------------+ RIGHTCompressibilityPhasicitySpontaneityPropertiesThrombus Aging +-----+---------------+---------+-----------+----------+--------------+ CFV  Full           Yes      Yes                                 +-----+---------------+---------+-----------+----------+--------------+  SFJ  Full           Yes      Yes                                  +-----+---------------+---------+-----------+----------+--------------+   +---------+---------------+---------+-----------+----------+--------------+ LEFT     CompressibilityPhasicitySpontaneityPropertiesThrombus Aging +---------+---------------+---------+-----------+----------+--------------+ CFV      Partial        Yes      Yes                                 +---------+---------------+---------+-----------+----------+--------------+ SFJ      Full           Yes      Yes                                 +---------+---------------+---------+-----------+----------+--------------+ FV Prox  None           No       No                                  +---------+---------------+---------+-----------+----------+--------------+ FV Mid   None           No       No                                  +---------+---------------+---------+-----------+----------+--------------+ FV DistalNone           No       No                                  +---------+---------------+---------+-----------+----------+--------------+ PFV      Partial        Yes      Yes                                 +---------+---------------+---------+-----------+----------+--------------+ POP      Full           Yes      Yes                                 +---------+---------------+---------+-----------+----------+--------------+ PTV      Full                                                        +---------+---------------+---------+-----------+----------+--------------+ PERO     Full                                                        +---------+---------------+---------+-----------+----------+--------------+ Deep vein thrombosis noted in the distal common femoral at the bifurcation of the proximal femoral and profunda, the profunda vein,  the proximal, middle and distal femoral vein. Cystic structure noted in the left popliteal fossa measuring 2.5 x06 x 1.7 cm.    Summary: RIGHT: -  No evidence of common femoral vein obstruction.   LEFT: - Findings consistent with acute deep vein thrombosis involving the left common femoral vein, left femoral vein, and left proximal profunda vein.  - Christen Wardrop cystic structure is found in the popliteal fossa.  *See table(s) above for measurements and observations. Electronically signed by Debby Robertson on 05/24/2024 at 8:14:01 PM.    Final    DG Toe 2nd Left Result Date: 05/24/2024 EXAM: 1 VIEW(S) XRAY OF THE LATERALITY TOES 05/24/2024 11:46:00 AM COMPARISON: None available. CLINICAL HISTORY: Pain 144615 FINDINGS: BONES AND JOINTS: No acute fracture. No malalignment. SOFT TISSUES: Unremarkable. No radiopaque foreign body. IMPRESSION: 1. No acute fracture or dislocation. Electronically signed by: Rogelia Myers MD MD 05/24/2024 06:05 PM EST RP Workstation: [id number]   US  EKG SITE RITE Result Date: 05/24/2024 If Site Rite image not attached, placement could not be confirmed due to current cardiac rhythm.       Scheduled Meds:  amLODipine   2.5 mg Oral Daily   ascorbic acid   500 mg Oral Daily   budesonide   0.5 mg Nebulization BID   Chlorhexidine  Gluconate Cloth  6 each Topical Daily   chlorpheniramine-HYDROcodone   5 mL Oral Q12H   donepezil   5 mg Oral QHS   ezetimibe   10 mg Oral QHS   And   simvastatin   20 mg Oral QHS   feeding supplement  237 mL Oral BID BM   fluconazole   50 mg Oral Daily   Gerhardt's butt cream   Topical BID   guaiFENesin   10 mL Oral Q8H   insulin  aspart  0-20 Units Subcutaneous TID WC   insulin  aspart  0-5 Units Subcutaneous QHS   insulin  aspart  5 Units Subcutaneous TID WC   insulin  glargine-yfgn  30 Units Subcutaneous QHS   ipratropium-albuterol   3 mL Nebulization Q6H   lactobacillus  1 g Oral TID WC   latanoprost   1 drop Both Eyes QHS   levothyroxine   50 mcg Oral See admin instructions   loratadine   10 mg Oral Daily   magnesium  oxide  400 mg Oral Daily   metoprolol  succinate  37.5 mg Oral Q12H   multivitamin with  minerals  1 tablet Oral Daily   pantoprazole   40 mg Oral Daily   polyethylene glycol  17 g Oral BID   sodium chloride  flush  3 mL Intravenous Q12H   sodium chloride  flush  3 mL Intravenous Q12H   thiamine   100 mg Oral Daily   zinc  sulfate (50mg  elemental zinc )  220 mg Oral Daily   Continuous Infusions:  cefTRIAXone  (ROCEPHIN )  IV     heparin  300 Units/hr (05/26/24 0625)   lactated ringers  50 mL/hr at 05/25/24 1515     LOS: 8 days    Time spent: over 30 min    Meliton Monte, MD [facility]   To contact the attending provider between 7A-7P or the covering provider during after hours 7P-7A, please log into the web site [email or web address] and access using universal [place] password for that web site. If you do not have the password, please call the hospital operator.  05/26/2024, 9:00 AM    "

## 2024-05-26 NOTE — Progress Notes (Signed)
 Patient was seen for increased supplemental O2 requirement.   She just had CT concerning for multifocal pneumonia and possible hematoma posterior to sacrum. Her anticoagulation was paused and antibiotic coverage broadened.   She is frail-appearing and listless with mild tachypnea, no accessory muscle use, and no pallor or diaphoresis. There is coarse rales bilaterally on chest auscultation. HR is in 130-range and regular.   She is in sinus rhythm and tachycardia likely compensatory in setting of severe respiratory illness. Discussed case with nightshift RN, will continue current plan.

## 2024-05-26 NOTE — Plan of Care (Signed)
" °  Problem: Metabolic: Goal: Ability to maintain appropriate glucose levels will improve Outcome: Progressing   Problem: Skin Integrity: Goal: Risk for impaired skin integrity will decrease Outcome: Progressing   Problem: Education: Goal: Ability to describe self-care measures that may prevent or decrease complications (Diabetes Survival Skills Education) will improve Outcome: Not Progressing   Problem: Coping: Goal: Ability to adjust to condition or change in health will improve Outcome: Not Progressing   Problem: Fluid Volume: Goal: Ability to maintain a balanced intake and output will improve Outcome: Not Progressing   Problem: Health Behavior/Discharge Planning: Goal: Ability to identify and utilize available resources and services will improve Outcome: Not Progressing Goal: Ability to manage health-related needs will improve Outcome: Not Progressing   Problem: Nutritional: Goal: Maintenance of adequate nutrition will improve Outcome: Not Progressing   Problem: Education: Goal: Knowledge of General Education information will improve Description: Including pain rating scale, medication(s)/side effects and non-pharmacologic comfort measures Outcome: Not Progressing   "

## 2024-05-26 NOTE — Progress Notes (Signed)
 PHARMACY - ANTICOAGULATION  Pharmacy Consult for IV Heparin  Indication: DVT  Allergies[1]  Patient Measurements: Height: 5' (152.4 cm) Weight: 40.9 kg (90 lb 2.7 oz) IBW/kg (Calculated) : 45.5 HEPARIN  DW (KG): 41.5  Vital Signs: Temp: 97.3 F (36.3 C) (01/10 0346) Temp Source: Oral (01/10 0346) BP: 113/51 (01/10 0346) Pulse Rate: 96 (01/10 0346)  Labs: Recent Labs    05/24/24 0853 05/25/24 0245 05/25/24 1502 05/26/24 0210  HGB 10.4* 9.0*  --  7.4*  HCT 31.3* 28.3*  --  22.9*  PLT 340 290  --  240  HEPARINUNFRC  --  >1.10* >1.10* 1.03*  CREATININE 0.95 0.88  --  0.78    Estimated Creatinine Clearance: 30.8 mL/min (by C-G formula based on SCr of 0.78 mg/dL).   Assessment: 89 y.o. female with femoral vein DVT on LE dopplers for heparin   1/10 AM update:  Heparin  level remains elevated  Hgb down some, no obvious bleeding (watch)  Goal of Therapy:  Heparin  level 0.3-0.7 units/ml Monitor platelets by anticoagulation protocol: Yes   Plan:  Hold heparin  x 1.5 hours Restart Heparin  at decreased rate of 300 units/hr Check heparin  level in 6-8 hours Watch Hgb closely   Lynwood Mckusick, PharmD, BCPS Clinical Pharmacist Phone: [phone]        [1]  Allergies Allergen Reactions   Penicillins Rash and Other (See Comments)    1980's   Sulfa Antibiotics Rash

## 2024-05-27 ENCOUNTER — Other Ambulatory Visit (HOSPITAL_COMMUNITY): Payer: Self-pay

## 2024-05-27 DIAGNOSIS — Z66 Do not resuscitate: Secondary | ICD-10-CM | POA: Diagnosis not present

## 2024-05-27 DIAGNOSIS — Z7189 Other specified counseling: Secondary | ICD-10-CM | POA: Diagnosis not present

## 2024-05-27 DIAGNOSIS — Z711 Person with feared health complaint in whom no diagnosis is made: Secondary | ICD-10-CM | POA: Diagnosis not present

## 2024-05-27 DIAGNOSIS — A419 Sepsis, unspecified organism: Secondary | ICD-10-CM | POA: Diagnosis not present

## 2024-05-27 DIAGNOSIS — Z515 Encounter for palliative care: Secondary | ICD-10-CM | POA: Diagnosis not present

## 2024-05-27 DIAGNOSIS — J189 Pneumonia, unspecified organism: Secondary | ICD-10-CM | POA: Diagnosis not present

## 2024-05-27 LAB — URINALYSIS, ROUTINE W REFLEX MICROSCOPIC
Bacteria, UA: NONE SEEN
Bilirubin Urine: NEGATIVE
Glucose, UA: 50 mg/dL — AB
Ketones, ur: NEGATIVE mg/dL
Leukocytes,Ua: NEGATIVE
Nitrite: NEGATIVE
Protein, ur: 30 mg/dL — AB
Specific Gravity, Urine: 1.04 — ABNORMAL HIGH (ref 1.005–1.030)
pH: 6 (ref 5.0–8.0)

## 2024-05-27 LAB — CBC
HCT: 31.7 % — ABNORMAL LOW (ref 36.0–46.0)
Hemoglobin: 10.9 g/dL — ABNORMAL LOW (ref 12.0–15.0)
MCH: 29.8 pg (ref 26.0–34.0)
MCHC: 34.4 g/dL (ref 30.0–36.0)
MCV: 86.6 fL (ref 80.0–100.0)
Platelets: 217 K/uL (ref 150–400)
RBC: 3.66 MIL/uL — ABNORMAL LOW (ref 3.87–5.11)
RDW: 18.4 % — ABNORMAL HIGH (ref 11.5–15.5)
WBC: 23.9 K/uL — ABNORMAL HIGH (ref 4.0–10.5)
nRBC: 2.5 % — ABNORMAL HIGH (ref 0.0–0.2)

## 2024-05-27 LAB — MAGNESIUM: Magnesium: 2 mg/dL (ref 1.7–2.4)

## 2024-05-27 LAB — BASIC METABOLIC PANEL WITH GFR
Anion gap: 8 (ref 5–15)
BUN: 30 mg/dL — ABNORMAL HIGH (ref 8–23)
CO2: 24 mmol/L (ref 22–32)
Calcium: 8 mg/dL — ABNORMAL LOW (ref 8.9–10.3)
Chloride: 105 mmol/L (ref 98–111)
Creatinine, Ser: 1.48 mg/dL — ABNORMAL HIGH (ref 0.44–1.00)
GFR, Estimated: 33 mL/min — ABNORMAL LOW
Glucose, Bld: 319 mg/dL — ABNORMAL HIGH (ref 70–99)
Potassium: 5.3 mmol/L — ABNORMAL HIGH (ref 3.5–5.1)
Sodium: 137 mmol/L (ref 135–145)

## 2024-05-27 LAB — GLUCOSE, CAPILLARY
Glucose-Capillary: 122 mg/dL — ABNORMAL HIGH (ref 70–99)
Glucose-Capillary: 166 mg/dL — ABNORMAL HIGH (ref 70–99)
Glucose-Capillary: 168 mg/dL — ABNORMAL HIGH (ref 70–99)
Glucose-Capillary: 180 mg/dL — ABNORMAL HIGH (ref 70–99)
Glucose-Capillary: 191 mg/dL — ABNORMAL HIGH (ref 70–99)

## 2024-05-27 LAB — COMPREHENSIVE METABOLIC PANEL WITH GFR
ALT: 7 U/L (ref 0–44)
AST: 43 U/L — ABNORMAL HIGH (ref 15–41)
Albumin: 2.5 g/dL — ABNORMAL LOW (ref 3.5–5.0)
Alkaline Phosphatase: 53 U/L (ref 38–126)
Anion gap: 9 (ref 5–15)
BUN: 24 mg/dL — ABNORMAL HIGH (ref 8–23)
CO2: 26 mmol/L (ref 22–32)
Calcium: 8.5 mg/dL — ABNORMAL LOW (ref 8.9–10.3)
Chloride: 100 mmol/L (ref 98–111)
Creatinine, Ser: 1.29 mg/dL — ABNORMAL HIGH (ref 0.44–1.00)
GFR, Estimated: 39 mL/min — ABNORMAL LOW
Glucose, Bld: 109 mg/dL — ABNORMAL HIGH (ref 70–99)
Potassium: 5.6 mmol/L — ABNORMAL HIGH (ref 3.5–5.1)
Sodium: 135 mmol/L (ref 135–145)
Total Bilirubin: 0.5 mg/dL (ref 0.0–1.2)
Total Protein: 5.2 g/dL — ABNORMAL LOW (ref 6.5–8.1)

## 2024-05-27 LAB — PHOSPHORUS: Phosphorus: 3.1 mg/dL (ref 2.5–4.6)

## 2024-05-27 MED ORDER — DEXTROSE IN LACTATED RINGERS 5 % IV SOLN: INTRAVENOUS | Status: DC

## 2024-05-27 MED ORDER — GLYCOPYRROLATE 0.2 MG/ML IJ SOLN: 0.1000 mg | Freq: Four times a day (QID) | INTRAMUSCULAR | Status: DC | PRN

## 2024-05-27 MED ORDER — MORPHINE SULFATE 10 MG/5ML PO SOLN
5.0000 mg | ORAL | Status: DC | PRN
  Administered 2024-05-27: 5 mg via ORAL
  Filled 2024-05-27: qty 5

## 2024-05-27 MED ORDER — LORAZEPAM 2 MG/ML IJ SOLN
0.5000 mg | INTRAMUSCULAR | Status: DC | PRN
  Administered 2024-05-27 – 2024-05-31 (×4): 0.5 mg via INTRAVENOUS
  Filled 2024-05-27 (×4): qty 1

## 2024-05-27 MED ORDER — BISACODYL 10 MG RE SUPP: 10.0000 mg | Freq: Every day | RECTAL | Status: DC | PRN

## 2024-05-27 MED ORDER — DEXTROSE-SODIUM CHLORIDE 5-0.9 % IV SOLN: INTRAVENOUS | Status: DC

## 2024-05-27 MED ORDER — SODIUM ZIRCONIUM CYCLOSILICATE 10 G PO PACK
10.0000 g | PACK | Freq: Once | ORAL | Status: DC
  Filled 2024-05-27: qty 1

## 2024-05-27 MED ORDER — SODIUM CHLORIDE 0.9 % IV SOLN
2.0000 g | INTRAVENOUS | Status: DC
  Administered 2024-05-28 – 2024-05-29 (×2): 2 g via INTRAVENOUS
  Filled 2024-05-27 (×2): qty 12.5

## 2024-05-27 NOTE — Progress Notes (Addendum)
 " PROGRESS NOTE    Hannah [name]  FMW:[id number] DOB: 1932-10-04 DOA: 05/17/2024 PCP: Sun, Vyvyan, MD  Chief Complaint  Patient presents with   Shortness of Breath   Altered Mental Status    Brief Narrative:   89 year old female with past medical history significant for HFpEF, hypertension, COPD, hypothyroidism, chronic kidney disease stage III and type 2 diabetes mellitus who presented with worsening shortness of breath and edema.   She's been diagnosed with influenza, MSSA pneumonia, and MSSA bacteremia.    She's been seen by ID.  Cardiology was c/s for SVT.    She's declined over the past few days.  Now requiring 8 L [place].  CT with multifocal pneumonia.    Goals of care with family ongoing.  Assessment & Plan:   Principal Problem:   Sepsis due to pneumonia [facility]) Active Problems:   Acute hypoxic respiratory failure (HCC)   Influenza Hannah [name] with pneumonia   Chronic obstructive asthma (HCC)   Hypertension   Insulin  dependent type 2 diabetes mellitus (HCC)   CKD stage 3a, GFR 45-59 ml/min (HCC)   Iron  deficiency anemia   UTI (urinary tract infection)   Hypothyroidism   Protein-calorie malnutrition, severe  Goals of Care Eldest daughter here from [place].  Her daughter (patient's granddaughter) Hannah [name] is also here.  We discussed goals of care, it seems like they'd be in alignment with DNR/I and deescalating of care (potentially continuing abx).  Will need to have conversation with Hannah [name] and Hannah [name] to ensure everyone in alignment.  Discussed options including transitioning to DNR/DNI and watching how she does on abx vs comfort measure.  Will circle back with the other daughters are present.   11:05 AM 3 daughters and brother present.  Granddaughter who is Hannah [name] careers [name] also present.  Recommended DNR/DNI.  Discussed comfort measures.  Hannah [name] preferred to continue abx.  Discussed that we could continue abx while supporting her and keeping as comfortable as possible.  Discussed my concern, that  she was not getting better and that she was declining (and may not get better).  For now, planning to transition to DNR/DNI.  Continue supportive care with abx and IVF for now (holding heparin  with concern for hematoma).  Will try to keep her as comfortable as possible in the interim.  Will monitor how she's doing day to day and plan to revisit conversation with regards to comfort in ~48 hrs or so.  7:07 PM Checked on this PM.  Appears comfortable.  Actually on no supplemental O2.  Concern of some mild LLE - likely due to DVT, dependent edema - no new recs.  Discussed AKI.  IVF.  They haven't given anything PO due to concern for aspiration.  I'm ok if she takes PO for comfort, just what she wants.  Last SLP note from 1/9 noted recs for regular, thin liquids.  Acute hypoxic respiratory failure Influenza Hannah [name] with MSSA pneumonia MSSA bacteremia Sepsis Repeat CT PE protocol 1/10 with multifocal pneumonia - tree in bud nodular opacities bilaterally, increased from prior exam.  Patchy airspace disease in mid to lower lung filed on L and RLL.  1/1 blood culture with staph aureus and abiotrophia defectiva (suspected contaminant, but follow follow up culture) 1/3 blood culture NGTDx5 Appreciate ID recommendations - continue ancef  while inpatient with goal of 7 days of IV abx and then transition to linezolid  600 mg PO BID with total of 4 weeks of treatment through 06/16/2024. Tamiflu  1/2-1/6 TTE without notable valvular abnormalities - no  plan for TEE leukocytosis in setting of steroid use.  D/c'd 1/7 - leukocytosis was improving 1/9, but up to 23 1/10 and 1/11.   Fever 1/10-11 - meeting criteria for sepsis this morning - broadened abx.  transitioned to cefepime  to cover for aspiration pneumonia/HAP and monitor (discussed with pharmacy).  Received 1 unit pRBC yesterday + lasix .  Holding on additional aggressive IVF due to resp status.  GOC conversations ongoing.  Acute Metabolic Encephalopathy Dementia VBG  without hypercarbia B12, ammonia, folate all normal Suspect delirium - waxing and waning recently Minimize deliriogenic meds I think head CT likely unlikely to change management - will consider based on clinical course  Anemia Suspected Hematoma posterior to Sacrum Drop from 9 -> 7.0 on 1/10  S/p 1 unit pRBC Hyperdense collection posterior to sacrum in subcutaneous soft tissues measuring 4.5 x 1.6 x 1.2 cm Normal b12, folate.  Elevated ferritin, low iron . Heparin  currently on hold  Left Lower Extremity DVT On 1/8 US  Heparin  gtt on hold with drop in hb and suspected hematoma noted above  Intermittent SVT: Short intermittent runs increased metop to 37.5 mg BID - will monitor Appreciate cardiology assistance - they've signed off 1/7 (see note).  Replace lytes to maintain K>4 and mag >2.  AKI  Hyperkalemia Received lasix  1/10.  Also pRBC.  Sepsis above as well. Lokelma  if able to tolerate Supportive care for now, will add gentle IVF in setting of her SOB  Hypertension BP on low side Hold amlodipine  Metop to continue with holding parameters  Concern for Poor UOP Urinary Retention Now s/p foley placement 1/9 800 cc out yesterday Oxybutynin  on hold 1/9 Consider trial of void in [name] few days  Possible UTI: No urine culture collected CT with bladder wall thickening, perivesicular fat stranding - infectious/inflammatory UA not suggestive of UTI - negative nitrites, negative leukocytes, 0-5 RBC, 6-10 WBC, no bacteria UA at presentation with +LE/nitrites - watch for UTI symptoms She's on cefepime  which should cover most typical causes of urine infection  Concern for Possible Dysphagia Thrush SLP eval, appreciate assistance fluconazole  Patient has history of oral cancer.  HFpEF Echo 05/18/2024 revealed normal left ventricular ejection fraction, with grade 1 diastolic dysfunction. Appears euvolemic to dry Cardiology now signed of 1/7.   Will diurese as needed   AKI Aki from  presentation has improved  Left 2nd Toe Injury Plain films unremarkable Supportive care  Dementia: Delirium precautions Continue Aricept .   T2DM Steroid induced hyperglycemia Basal, bolus, SSI - adjust as needed Stop steroids Monitor  Loose watery diarrhea: + c diff antigen, negative PCR.  C diff ruled out with ID assistance, appreciate assistance - suspected abx associated diarrhea   Hypokalemia Hypomagnesemia Hypophosphatemia Follow and replace as needed   Poor PO intake Severe Protein Calorie Malnutrition RD Hold megace  - should probably be discontinued indefinitely based on VTE above Body mass index is 17.65 kg/m.  Bilateral LE Pain Described as cramping, unclear cause on 1/9 Seems resolved today palpable bilateral pulses.  No notable masses or visible abnormalities.  Compartments soft.   Heat Rash Suspect this is rash on her bottom Will monitor, supportive care  Severe Protein Calorie Malnutrition Body mass index is 17.65 kg/m.  Concern regarding frequent blood draws.  S/p PICC placement 1/8    DVT prophylaxis: heparin  Code Status: full Family Communication: none Disposition:   Status is: Inpatient Remains inpatient appropriate because: need for continued inpatient care   Consultants:  Palliative care cardiology  Procedures:  Echo IMPRESSIONS  1. Left ventricular ejection fraction, by estimation, is 60 to 65%. The  left ventricle has normal function. The left ventricle has no regional  wall motion abnormalities. Left ventricular diastolic parameters are  consistent with Grade I diastolic  dysfunction (impaired relaxation).   2. Right ventricular systolic function is normal. The right ventricular  size is normal.   3. The mitral valve is normal in structure. Mild mitral valve  regurgitation. No evidence of mitral stenosis.   4. The aortic valve is tricuspid. Aortic valve regurgitation is not  visualized. No aortic stenosis is present.    5. The inferior vena cava is normal in size with greater than 50%  respiratory variability, suggesting right atrial pressure of 3 mmHg.   Antimicrobials:  Anti-infectives (From admission, onward)    Start     Dose/Rate Route Frequency Ordered Stop   05/26/24 2100  ceFEPIme  (MAXIPIME ) 2 g in sodium chloride  0.9 % 100 mL IVPB        2 g 200 mL/hr over 30 Minutes Intravenous Every 12 hours 05/26/24 1943     05/26/24 0915  cefTRIAXone  (ROCEPHIN ) 2 g in sodium chloride  0.9 % 100 mL IVPB  Status:  Discontinued        2 g 200 mL/hr over 30 Minutes Intravenous Every 24 hours 05/26/24 0822 05/26/24 1943   05/25/24 1000  fluconazole  (DIFLUCAN ) tablet 50 mg       Placed in Followed by Linked Group   50 mg Oral Daily 05/24/24 1049 06/03/24 0959   05/24/24 1145  fluconazole  (DIFLUCAN ) tablet 100 mg       Placed in Followed by Linked Group   100 mg Oral  Once 05/24/24 1049 05/24/24 1234   05/24/24 0000  linezolid  (ZYVOX ) 600 MG tablet        600 mg Oral 2 times daily 05/22/24 1502 06/14/24 2359   05/21/24 1230  fluconazole  (DIFLUCAN ) tablet 50 mg  Status:  Discontinued        50 mg Oral Daily 05/21/24 1140 05/21/24 1432   05/18/24 1829  oseltamivir  (TAMIFLU ) capsule 30 mg        30 mg Oral Daily 05/18/24 1829 05/22/24 0941   05/18/24 1545  ceFAZolin  (ANCEF ) IVPB 2g/100 mL premix  Status:  Discontinued        2 g 200 mL/hr over 30 Minutes Intravenous Every 12 hours 05/18/24 1451 05/26/24 0822   05/18/24 1415  azithromycin  (ZITHROMAX ) 500 mg in sodium chloride  0.9 % 250 mL IVPB  Status:  Discontinued        500 mg 250 mL/hr over 60 Minutes Intravenous  Once 05/18/24 1326 05/18/24 1332   05/18/24 1415  cefTRIAXone  (ROCEPHIN ) 1 g in sodium chloride  0.9 % 100 mL IVPB  Status:  Discontinued        1 g 200 mL/hr over 30 Minutes Intravenous Every 24 hours 05/18/24 1326 05/18/24 1334   05/17/24 2200  oseltamivir  (TAMIFLU ) capsule 30 mg  Status:  Discontinued        30 mg Oral 2 times daily  05/17/24 2025 05/18/24 1829   05/17/24 2100  ceFEPIme  (MAXIPIME ) 2 g in sodium chloride  0.9 % 100 mL IVPB  Status:  Discontinued        2 g 200 mL/hr over 30 Minutes Intravenous Every 24 hours 05/17/24 2057 05/18/24 1451   05/17/24 2100  vancomycin  (VANCOCIN ) IVPB 1000 mg/200 mL premix        1,000 mg 200 mL/hr over 60 Minutes Intravenous  Once  05/17/24 2057 05/17/24 2324   05/17/24 2056  vancomycin  variable dose per unstable renal function (pharmacist dosing)  Status:  Discontinued         Does not apply See admin instructions 05/17/24 2057 05/18/24 1451   05/17/24 1900  cefTRIAXone  (ROCEPHIN ) 1 g in sodium chloride  0.9 % 100 mL IVPB        1 g 200 mL/hr over 30 Minutes Intravenous  Once 05/17/24 1849 05/17/24 1940   05/17/24 1900  azithromycin  (ZITHROMAX ) 500 mg in sodium chloride  0.9 % 250 mL IVPB        500 mg 250 mL/hr over 60 Minutes Intravenous  Once 05/17/24 1849 05/17/24 2115       Subjective:  Grandaughter and eldest daughter at bedside Discussion regarding GOC  Objective: Vitals:   05/27/24 0320 05/27/24 0333 05/27/24 0600 05/27/24 0705  BP: (!) 98/56  (!) 94/56 (!) 98/59  Pulse: (!) 127  (!) 106 99  Resp: 20  17 14   Temp: (!) 100.8 F (38.2 C)   99.1 F (37.3 C)  TempSrc: Oral   Axillary  SpO2: 100%  96% 97%  Weight:  41 kg    Height:        Intake/Output Summary (Last 24 hours) at 05/27/2024 0934 Last data filed at 05/26/2024 2210 Gross per 24 hour  Intake 1021.61 ml  Output 800 ml  Net 221.61 ml   Filed Weights   05/25/24 0407 05/26/24 0346 05/27/24 0333  Weight: 46.3 kg 40.9 kg 41 kg    Examination:  Limited exam this AM, discussing GOC with family  General: ill appearing, frail Cardiovascular: tachycardia Lungs: on 8 L, upper airway sounds appreciable from bedside Neurological: delirious moaning, moving all extremities   Data Reviewed: I have personally reviewed following labs and imaging studies  CBC: Recent Labs  Lab 05/21/24 0228  05/23/24 0228 05/24/24 0853 05/25/24 0245 05/26/24 0210 05/26/24 0917 05/26/24 1605 05/27/24 0325  WBC 16.0* 20.9* 27.3* 20.8* 23.7*  --   --  23.9*  NEUTROABS 12.3*  --  19.0*  --   --   --   --   --   HGB 10.2* 9.8* 10.4* 9.0* 7.4* 7.6* 7.0* 10.9*  HCT 30.9* 29.7* 31.3* 28.3* 22.9* 22.8* 21.8* 31.7*  MCV 87.8 88.1 88.2 92.2 89.8  --   --  86.6  PLT 290 311 340 290 240  --   --  217    Basic Metabolic Panel: Recent Labs  Lab 05/23/24 0228 05/23/24 0233 05/24/24 0853 05/25/24 0245 05/26/24 0210 05/27/24 0325  NA  --  140 134* 137 133* 135  K  --  4.3 4.9 4.8 5.0 5.6*  CL  --  105 101 104 100 100  CO2  --  24 24 28 28 26   GLUCOSE  --  145* 168* 80 123* 109*  BUN  --  19 18 20 19  24*  CREATININE  --  0.92 0.95 0.88 0.78 1.29*  CALCIUM   --  8.0* 8.3* 8.3* 8.2* 8.5*  MG 2.2  --  2.3 2.1 1.9 2.0  PHOS  --  2.2* 1.8* 1.3* 2.2* 3.1    GFR: Estimated Creatinine Clearance: 19.1 mL/min (Haron Beilke) (by C-G formula based on SCr of 1.29 mg/dL (H)).  Liver Function Tests: Recent Labs  Lab 05/23/24 0233 05/24/24 0853 05/25/24 0245 05/26/24 0210 05/27/24 0325  AST  --  45* 34 33 43*  ALT  --  18 15 10 7   ALKPHOS  --  46  52 189* 53  BILITOT  --  0.3 0.3 0.3 0.5  PROT  --  5.4* 4.8* 4.5* 5.2*  ALBUMIN 3.1* 3.2* 2.7* 2.5* 2.5*    CBG: Recent Labs  Lab 05/26/24 1608 05/26/24 2123 05/26/24 2320 05/27/24 0034 05/27/24 0620  GLUCAP 206* 76 70 166* 122*     Recent Results (from the past 240 hours)  Resp panel by RT-PCR (RSV, Flu Rhiana Morash&B, Covid) Anterior Nasal Swab     Status: Abnormal   Collection Time: 05/17/24  5:55 PM   Specimen: Anterior Nasal Swab  Result Value Ref Range Status   SARS Coronavirus 2 by RT PCR NEGATIVE NEGATIVE Final    Comment: (NOTE) SARS-CoV-2 target nucleic acids are NOT DETECTED.  The SARS-CoV-2 RNA is generally detectable in upper respiratory specimens during the acute phase of infection. The lowest concentration of SARS-CoV-2 viral copies this  assay can detect is 138 copies/mL. Elex Mainwaring negative result does not preclude SARS-Cov-2 infection and should not be used as the sole basis for treatment or other patient management decisions. Aysha Livecchi negative result may occur with  improper specimen collection/handling, submission of specimen other than nasopharyngeal swab, presence of viral mutation(s) within the areas targeted by this assay, and inadequate number of viral copies(<138 copies/mL). Landen Knoedler negative result must be combined with clinical observations, patient history, and epidemiological information. The expected result is Negative.  Fact Sheet for Patients:  bloggercourse.com  Fact Sheet for Healthcare Providers:  seriousbroker.it  This test is no t yet approved or cleared by the [place]  FDA and  has been authorized for detection and/or diagnosis of SARS-CoV-2 by FDA under an Emergency Use Authorization (EUA). This EUA will remain  in effect (meaning this test can be used) for the duration of the COVID-19 declaration under Section 564(b)(1) of the Act, 21 U.S.C.section 360bbb-3(b)(1), unless the authorization is terminated  or revoked sooner.       Influenza Buryl Bamber by PCR POSITIVE (Kitt Ledet) NEGATIVE Final   Influenza B by PCR NEGATIVE NEGATIVE Final    Comment: (NOTE) The Xpert Xpress SARS-CoV-2/FLU/RSV plus assay is intended as an aid in the diagnosis of influenza from Nasopharyngeal swab specimens and should not be used as Marti Acebo sole basis for treatment. Nasal washings and aspirates are unacceptable for Xpert Xpress SARS-CoV-2/FLU/RSV testing.  Fact Sheet for Patients: bloggercourse.com  Fact Sheet for Healthcare Providers: seriousbroker.it  This test is not yet approved or cleared by the [place]  FDA and has been authorized for detection and/or diagnosis of SARS-CoV-2 by FDA under an Emergency Use Authorization (EUA). This EUA  will remain in effect (meaning this test can be used) for the duration of the COVID-19 declaration under Section 564(b)(1) of the Act, 21 U.S.C. section 360bbb-3(b)(1), unless the authorization is terminated or revoked.     Resp Syncytial Virus by PCR NEGATIVE NEGATIVE Final    Comment: (NOTE) Fact Sheet for Patients: bloggercourse.com  Fact Sheet for Healthcare Providers: seriousbroker.it  This test is not yet approved or cleared by the [place]  FDA and has been authorized for detection and/or diagnosis of SARS-CoV-2 by FDA under an Emergency Use Authorization (EUA). This EUA will remain in effect (meaning this test can be used) for the duration of the COVID-19 declaration under Section 564(b)(1) of the Act, 21 U.S.C. section 360bbb-3(b)(1), unless the authorization is terminated or revoked.  Performed at [facility], [address], [place], [place] [address]   Culture, blood (single)     Status: Abnormal   Collection Time: 05/17/24  7:15 PM   Specimen: BLOOD LEFT FOREARM  Result Value Ref Range Status   Specimen Description   Final    BLOOD LEFT FOREARM Performed at [facility], [address] [place], [place] [address]    Special Requests   Final    BOTTLES DRAWN AEROBIC AND ANAEROBIC Blood Culture adequate volume Performed at [facility], [address] [place], [place] [address]    Culture  Setup Time   Final    GRAM POSITIVE COCCI IN CLUSTERS AEROBIC BOTTLE ONLY CRITICAL RESULT CALLED TO, READ BACK BY AND VERIFIED WITH: PHARMD JEREMY F [id number] FCP GRAM POSITIVE COCCI IN CHAINS ANAEROBIC BOTTLE ONLY RBV JESICCA CARNIE PHARMD 05/19/2024 BY DD @ 0134    Culture (Larissa Pegg)  Final    STAPHYLOCOCCUS AUREUS ABIOTROPHIA DEFECTIVA Standardized susceptibility testing for this organism is not available. Performed at [facility], [address] [place], [place]  [address]    Report Status 05/20/2024 FINAL  Final   Organism ID, Bacteria STAPHYLOCOCCUS AUREUS  Final      Susceptibility   Staphylococcus aureus - MIC*    CIPROFLOXACIN  >=8 RESISTANT Resistant     ERYTHROMYCIN <=0.25 SENSITIVE Sensitive     GENTAMICIN <=0.5 SENSITIVE Sensitive     OXACILLIN 0.5 SENSITIVE Sensitive     TETRACYCLINE <=1 SENSITIVE Sensitive     VANCOMYCIN  1 SENSITIVE Sensitive     TRIMETH/SULFA <=10 SENSITIVE Sensitive     CLINDAMYCIN <=0.25 SENSITIVE Sensitive     RIFAMPIN <=0.5 SENSITIVE Sensitive     Inducible Clindamycin NEGATIVE Sensitive     LINEZOLID  2 SENSITIVE Sensitive     * STAPHYLOCOCCUS AUREUS  Blood Culture ID Panel (Reflexed)     Status: Abnormal   Collection Time: 05/17/24  7:15 PM  Result Value Ref Range Status   Enterococcus faecalis NOT DETECTED NOT DETECTED Final   Enterococcus Faecium NOT DETECTED NOT DETECTED Final   Listeria monocytogenes NOT DETECTED NOT DETECTED Final   Staphylococcus species DETECTED (Kennidy Lamke) NOT DETECTED Final    Comment: CRITICAL RESULT CALLED TO, READ BACK BY AND VERIFIED WITH: PHARMD JEREMY F [id number] FCP    Staphylococcus aureus (BCID) DETECTED (Dara Camargo) NOT DETECTED Final    Comment: CRITICAL RESULT CALLED TO, READ BACK BY AND VERIFIED WITH: PHARMD JEREMY F [id number] FCP    Staphylococcus epidermidis NOT DETECTED NOT DETECTED Final   Staphylococcus lugdunensis NOT DETECTED NOT DETECTED Final   Streptococcus species NOT DETECTED NOT DETECTED Final   Streptococcus agalactiae NOT DETECTED NOT DETECTED Final   Streptococcus pneumoniae NOT DETECTED NOT DETECTED Final   Streptococcus pyogenes NOT DETECTED NOT DETECTED Final   Shametra [place]umberland.calcoaceticus-baumannii NOT DETECTED NOT DETECTED Final   Bacteroides fragilis NOT DETECTED NOT DETECTED Final   Enterobacterales NOT DETECTED NOT DETECTED Final   Enterobacter cloacae complex NOT DETECTED NOT DETECTED Final   Escherichia coli NOT DETECTED NOT DETECTED Final   Klebsiella aerogenes NOT  DETECTED NOT DETECTED Final   Klebsiella oxytoca NOT DETECTED NOT DETECTED Final   Klebsiella pneumoniae NOT DETECTED NOT DETECTED Final   Proteus species NOT DETECTED NOT DETECTED Final   Salmonella species NOT DETECTED NOT DETECTED Final   Serratia marcescens NOT DETECTED NOT DETECTED Final   Haemophilus influenzae NOT DETECTED NOT DETECTED Final   Neisseria meningitidis NOT DETECTED NOT DETECTED Final   Pseudomonas aeruginosa NOT DETECTED NOT DETECTED Final   Stenotrophomonas maltophilia NOT DETECTED NOT DETECTED Final   Candida albicans NOT DETECTED NOT DETECTED Final  Candida auris NOT DETECTED NOT DETECTED Final   Candida glabrata NOT DETECTED NOT DETECTED Final   Candida krusei NOT DETECTED NOT DETECTED Final   Candida parapsilosis NOT DETECTED NOT DETECTED Final   Candida tropicalis NOT DETECTED NOT DETECTED Final   Cryptococcus neoformans/gattii NOT DETECTED NOT DETECTED Final   Meth resistant mecA/C and MREJ NOT DETECTED NOT DETECTED Final    Comment: Performed at [facility], [address] [place], [place] [address]  Blood Culture ID Panel (Reflexed)     Status: None   Collection Time: 05/18/24  7:15 PM  Result Value Ref Range Status   Enterococcus faecalis NOT DETECTED NOT DETECTED Final   Enterococcus Faecium NOT DETECTED NOT DETECTED Final   Listeria monocytogenes NOT DETECTED NOT DETECTED Final   Staphylococcus species NOT DETECTED NOT DETECTED Final   Staphylococcus aureus (BCID) NOT DETECTED NOT DETECTED Final   Staphylococcus epidermidis NOT DETECTED NOT DETECTED Final   Staphylococcus lugdunensis NOT DETECTED NOT DETECTED Final   Streptococcus species NOT DETECTED NOT DETECTED Final   Streptococcus agalactiae NOT DETECTED NOT DETECTED Final   Streptococcus pneumoniae NOT DETECTED NOT DETECTED Final   Streptococcus pyogenes NOT DETECTED NOT DETECTED Final   Nekeya Briski.calcoaceticus-baumannii NOT DETECTED NOT DETECTED Final   Bacteroides fragilis NOT DETECTED NOT  DETECTED Final   Enterobacterales NOT DETECTED NOT DETECTED Final   Enterobacter cloacae complex NOT DETECTED NOT DETECTED Final   Escherichia coli NOT DETECTED NOT DETECTED Final   Klebsiella aerogenes NOT DETECTED NOT DETECTED Final   Klebsiella oxytoca NOT DETECTED NOT DETECTED Final   Klebsiella pneumoniae NOT DETECTED NOT DETECTED Final   Proteus species NOT DETECTED NOT DETECTED Final   Salmonella species NOT DETECTED NOT DETECTED Final   Serratia marcescens NOT DETECTED NOT DETECTED Final   Haemophilus influenzae NOT DETECTED NOT DETECTED Final   Neisseria meningitidis NOT DETECTED NOT DETECTED Final   Pseudomonas aeruginosa NOT DETECTED NOT DETECTED Final   Stenotrophomonas maltophilia NOT DETECTED NOT DETECTED Final   Candida albicans NOT DETECTED NOT DETECTED Final   Candida auris NOT DETECTED NOT DETECTED Final   Candida glabrata NOT DETECTED NOT DETECTED Final   Candida krusei NOT DETECTED NOT DETECTED Final   Candida parapsilosis NOT DETECTED NOT DETECTED Final   Candida tropicalis NOT DETECTED NOT DETECTED Final   Cryptococcus neoformans/gattii NOT DETECTED NOT DETECTED Final    Comment: Performed at [facility], [address], [place], [place] [address]  Culture, blood (Routine X 2) w Reflex to ID Panel     Status: None   Collection Time: 05/19/24  9:04 AM   Specimen: BLOOD LEFT ARM  Result Value Ref Range Status   Specimen Description BLOOD LEFT ARM  Final   Special Requests   Final    BOTTLES DRAWN AEROBIC AND ANAEROBIC Blood Culture adequate volume   Culture   Final    NO GROWTH 5 DAYS Performed at [facility], [address] [place], [place] [address]    Report Status 05/24/2024 FINAL  Final  Culture, blood (Routine X 2) w Reflex to ID Panel     Status: None   Collection Time: 05/19/24  9:12 AM   Specimen: BLOOD LEFT HAND  Result Value Ref Range Status   Specimen Description BLOOD LEFT HAND  Final   Special Requests   Final    BOTTLES DRAWN  AEROBIC AND ANAEROBIC Blood Culture results may not be optimal due to an inadequate volume of blood received in  culture bottles   Culture   Final    NO GROWTH 5 DAYS Performed at [facility], [address] [place], [place] [address]    Report Status 05/24/2024 FINAL  Final  C Difficile Quick Screen w PCR reflex     Status: Abnormal   Collection Time: 05/19/24  8:29 PM   Specimen: STOOL  Result Value Ref Range Status   C Diff antigen POSITIVE (Miguelangel Korn) NEGATIVE Final   C Diff toxin NEGATIVE NEGATIVE Final   C Diff interpretation Results are indeterminate. See PCR results.  Final    Comment: Performed at [facility], [address], [place], [place] [address]  C. Diff by PCR, Reflexed     Status: None   Collection Time: 05/19/24  8:29 PM  Result Value Ref Range Status   Toxigenic C. Difficile by PCR NEGATIVE NEGATIVE Final    Comment: Patient is colonized with non toxigenic C. difficile. May not need treatment unless significant symptoms are present.   Hypervirulent Strain PRESUMPTIVE NEGATIVE PRESUMPTIVE NEGATIVE Final    Comment: Performed at [facility], [address] [place], [place] [address]  Gastrointestinal Panel by PCR , Stool     Status: None   Collection Time: 05/20/24 12:10 AM   Specimen: Stool  Result Value Ref Range Status   Campylobacter species NOT DETECTED NOT DETECTED Final   Plesimonas shigelloides NOT DETECTED NOT DETECTED Final   Salmonella species NOT DETECTED NOT DETECTED Final   Yersinia enterocolitica NOT DETECTED NOT DETECTED Final   Vibrio species NOT DETECTED NOT DETECTED Final   Vibrio cholerae NOT DETECTED NOT DETECTED Final   Enteroaggregative E coli (EAEC) NOT DETECTED NOT DETECTED Final   Enteropathogenic E coli (EPEC) NOT DETECTED NOT DETECTED Final   Enterotoxigenic E coli (ETEC) NOT DETECTED NOT DETECTED Final   Shiga like toxin producing E coli (STEC) NOT DETECTED NOT DETECTED Final   Shigella/Enteroinvasive E coli (EIEC) NOT  DETECTED NOT DETECTED Final   Cryptosporidium NOT DETECTED NOT DETECTED Final   Cyclospora cayetanensis NOT DETECTED NOT DETECTED Final   Entamoeba histolytica NOT DETECTED NOT DETECTED Final   Giardia lamblia NOT DETECTED NOT DETECTED Final   Adenovirus F40/41 NOT DETECTED NOT DETECTED Final   Astrovirus NOT DETECTED NOT DETECTED Final   Norovirus GI/GII NOT DETECTED NOT DETECTED Final   Rotavirus Veronique Warga NOT DETECTED NOT DETECTED Final   Sapovirus (I, II, IV, and V) NOT DETECTED NOT DETECTED Final    Comment: Performed at [facility], [address] [place], [place] [address]  Culture, MAINE Urine     Status: None   Collection Time: 05/25/24  1:19 AM   Specimen: Urine, Random  Result Value Ref Range Status   Specimen Description URINE, RANDOM  Final   Special Requests NONE  Final   Culture   Final    NO GROWTH NO GROUP B STREP (S.AGALACTIAE) ISOLATED Performed at [facility], [address], [place], [place] [address]    Report Status 05/26/2024 FINAL  Final  MRSA Next Gen by PCR, Nasal     Status: None   Collection Time: 05/26/24  8:00 PM   Specimen: Nasal Mucosa; Nasal Swab  Result Value Ref Range Status   MRSA by PCR Next Gen NOT DETECTED NOT DETECTED Final    Comment: (NOTE) The GeneXpert MRSA Assay (FDA approved for NASAL specimens only), is one component of [employer] comprehensive MRSA colonization surveillance program. It is not intended to  diagnose MRSA infection nor to guide or monitor treatment for MRSA infections. Test performance is not FDA approved in patients less than 13 years old. Performed at [facility], [address], [place], [place] [address]          Radiology Studies: CT Angio Chest Pulmonary Embolism (PE) W or WO Contrast Result Date: 05/26/2024 CLINICAL DATA:  Abdominal pain, acute, nonlocalized. Chronic dyspnea, chest wall or pleural disease suspected. EXAM: CT ANGIOGRAPHY CHEST CT ABDOMEN AND PELVIS WITH CONTRAST TECHNIQUE:  Multidetector CT imaging of the chest was performed using the standard protocol during bolus administration of intravenous contrast. Multiplanar CT image reconstructions and MIPs were obtained to evaluate the vascular anatomy. Multidetector CT imaging of the abdomen and pelvis was performed using the standard protocol during bolus administration of intravenous contrast. RADIATION DOSE REDUCTION: This exam was performed according to the departmental dose-optimization program which includes automated exposure control, adjustment of the mA and/or kV according to patient size and/or use of iterative reconstruction technique. CONTRAST:  75mL OMNIPAQUE  IOHEXOL  350 MG/ML SOLN COMPARISON:  05/23/2024. FINDINGS: CTA CHEST FINDINGS Cardiovascular: The heart is mildly enlarged and there is Valjean Ruppel trace pericardial effusion. There is atherosclerotic calcification of the aorta without evidence of aneurysm. The pulmonary trunk is normal in caliber. No evidence of pulmonary embolism is seen. Evaluation of the pulmonary arteries is limited due to respiratory motion artifact. Mediastinum/Nodes: Prominent lymph nodes are present in the mediastinum measuring up to 1.1 cm in the subcarinal space. No axillary or mediastinal lymphadenopathy is seen. The thyroid  gland and esophagus are within normal limits. There is Biannca Scantlin small hiatal hernia. Lungs/Pleura: Paraseptal and centrilobular emphysematous changes are noted in the lungs. Pleural and parenchymal scarring is present at the right lung apex. Scattered tree-in-bud nodular opacities are present in the lungs bilaterally and increased from the prior exam. There is patchy airspace disease in the mid to lower lung field on the left and in the right lower lobe. No effusion or pneumothorax is seen. Musculoskeletal: Degenerative changes are present in the thoracic spine. No acute osseous abnormality is seen. Review of the MIP images confirms the above findings. CT ABDOMEN and PELVIS FINDINGS  Hepatobiliary: No focal liver abnormality is seen. No gallstones, gallbladder wall thickening, or biliary dilatation. Pancreas: Unremarkable. No pancreatic ductal dilatation or surrounding inflammatory changes. Spleen: Normal in size without focal abnormality. Adrenals/Urinary Tract: The adrenal glands are within normal limits. The kidneys enhance symmetrically. Subcentimeter hypodensities are present in the kidneys bilaterally which are too small to further characterize. No renal calculus or hydronephrosis bilaterally. Decorey Wahlert Foley catheter is present in the urinary bladder. There is mild bladder wall thickening with perivesicular fat stranding. Stomach/Bowel: There is Harue Pribble small hiatal hernia. No bowel obstruction, free air, or pneumatosis is seen. Few scattered diverticula are present along the colon without evidence of diverticulitis. Kennard Fildes moderate amount of retained stool is present in the colon. Appendix is not seen. Vascular/Lymphatic: Aortic atherosclerosis. No enlarged abdominal or pelvic lymph nodes. Reproductive: Uterus and bilateral adnexa are unremarkable. Other: No abdominopelvic ascites. Musculoskeletal: Subcutaneous fat stranding and edema are noted in the lumbar region and right flank extending into the right hip and over the left hip, suggesting anasarca. There is Rhyann Berton focal hyperdense region in the subcutaneous fat posterior to the sacrum measuring 4.5 x 1.6 x 1.2 cm with attenuation of 65 Hounsfield units. Degenerative changes are present in the lumbar spine. No acute fracture is seen. Review of the MIP images confirms the above findings. IMPRESSION:  1. No evidence of pulmonary embolism. 2. Scattered tree-in-bud nodular densities and airspace opacities in the mid to lower lung field on the left and right lower lobe, suspicious for multifocal pneumonia. 3. Emphysema. 4. Foley catheter in place with bladder wall thickening and perivesicular fat stranding, possible infectious or inflammatory cystitis. Moderate  amount of retained stool in the colon suggesting constipation. 5. Focal hyperdense collection posterior to the sacrum in the subcutaneous soft tissues measuring 4.5 x 1.6 x 1.2 cm, query hematoma. 6. Moderate amount of retained stool in the colon suggesting constipation. 7. Diverticulosis without diverticulitis. 8. Small hiatal hernia. 9. Aortic atherosclerosis. Electronically Signed   By: Leita Birmingham M.D.   On: 05/26/2024 18:53   CT ABDOMEN PELVIS W CONTRAST Result Date: 05/26/2024 CLINICAL DATA:  Abdominal pain, acute, nonlocalized. Chronic dyspnea, chest wall or pleural disease suspected. EXAM: CT ANGIOGRAPHY CHEST CT ABDOMEN AND PELVIS WITH CONTRAST TECHNIQUE: Multidetector CT imaging of the chest was performed using the standard protocol during bolus administration of intravenous contrast. Multiplanar CT image reconstructions and MIPs were obtained to evaluate the vascular anatomy. Multidetector CT imaging of the abdomen and pelvis was performed using the standard protocol during bolus administration of intravenous contrast. RADIATION DOSE REDUCTION: This exam was performed according to the departmental dose-optimization program which includes automated exposure control, adjustment of the mA and/or kV according to patient size and/or use of iterative reconstruction technique. CONTRAST:  75mL OMNIPAQUE  IOHEXOL  350 MG/ML SOLN COMPARISON:  05/23/2024. FINDINGS: CTA CHEST FINDINGS Cardiovascular: The heart is mildly enlarged and there is Linkyn Gobin trace pericardial effusion. There is atherosclerotic calcification of the aorta without evidence of aneurysm. The pulmonary trunk is normal in caliber. No evidence of pulmonary embolism is seen. Evaluation of the pulmonary arteries is limited due to respiratory motion artifact. Mediastinum/Nodes: Prominent lymph nodes are present in the mediastinum measuring up to 1.1 cm in the subcarinal space. No axillary or mediastinal lymphadenopathy is seen. The thyroid  gland and  esophagus are within normal limits. There is Nyjah Denio small hiatal hernia. Lungs/Pleura: Paraseptal and centrilobular emphysematous changes are noted in the lungs. Pleural and parenchymal scarring is present at the right lung apex. Scattered tree-in-bud nodular opacities are present in the lungs bilaterally and increased from the prior exam. There is patchy airspace disease in the mid to lower lung field on the left and in the right lower lobe. No effusion or pneumothorax is seen. Musculoskeletal: Degenerative changes are present in the thoracic spine. No acute osseous abnormality is seen. Review of the MIP images confirms the above findings. CT ABDOMEN and PELVIS FINDINGS Hepatobiliary: No focal liver abnormality is seen. No gallstones, gallbladder wall thickening, or biliary dilatation. Pancreas: Unremarkable. No pancreatic ductal dilatation or surrounding inflammatory changes. Spleen: Normal in size without focal abnormality. Adrenals/Urinary Tract: The adrenal glands are within normal limits. The kidneys enhance symmetrically. Subcentimeter hypodensities are present in the kidneys bilaterally which are too small to further characterize. No renal calculus or hydronephrosis bilaterally. Tru Rana Foley catheter is present in the urinary bladder. There is mild bladder wall thickening with perivesicular fat stranding. Stomach/Bowel: There is Audrea Bolte small hiatal hernia. No bowel obstruction, free air, or pneumatosis is seen. Few scattered diverticula are present along the colon without evidence of diverticulitis. Malania Gawthrop moderate amount of retained stool is present in the colon. Appendix is not seen. Vascular/Lymphatic: Aortic atherosclerosis. No enlarged abdominal or pelvic lymph nodes. Reproductive: Uterus and bilateral adnexa are unremarkable. Other: No abdominopelvic ascites. Musculoskeletal: Subcutaneous fat stranding and edema are noted in  the lumbar region and right flank extending into the right hip and over the left hip, suggesting  anasarca. There is Keshara Kiger focal hyperdense region in the subcutaneous fat posterior to the sacrum measuring 4.5 x 1.6 x 1.2 cm with attenuation of 65 Hounsfield units. Degenerative changes are present in the lumbar spine. No acute fracture is seen. Review of the MIP images confirms the above findings. IMPRESSION: 1. No evidence of pulmonary embolism. 2. Scattered tree-in-bud nodular densities and airspace opacities in the mid to lower lung field on the left and right lower lobe, suspicious for multifocal pneumonia. 3. Emphysema. 4. Foley catheter in place with bladder wall thickening and perivesicular fat stranding, possible infectious or inflammatory cystitis. Moderate amount of retained stool in the colon suggesting constipation. 5. Focal hyperdense collection posterior to the sacrum in the subcutaneous soft tissues measuring 4.5 x 1.6 x 1.2 cm, query hematoma. 6. Moderate amount of retained stool in the colon suggesting constipation. 7. Diverticulosis without diverticulitis. 8. Small hiatal hernia. 9. Aortic atherosclerosis. Electronically Signed   By: Leita Birmingham M.D.   On: 05/26/2024 18:53   DG CHEST PORT 1 VIEW Result Date: 05/25/2024 EXAM: 1 VIEW(S) XRAY OF THE CHEST 05/25/2024 12:32:00 PM COMPARISON: 05/20/2024 CLINICAL HISTORY: Cough FINDINGS: LINES, TUBES AND DEVICES: Right PICC with tip overlying the expected region of the superior cavoatrial junction. LUNGS AND PLEURA: Developing bibasilar patchy airspace opacities. Trace pleural effusions. No pneumothorax. HEART AND MEDIASTINUM: Atherosclerotic plaque. No acute abnormality of the cardiac and mediastinal silhouettes. BONES AND SOFT TISSUES: No acute osseous abnormality. IMPRESSION: 1. Developing bibasilar patchy airspace opacities and trace pleural effusions. 2. Right PICC tip overlies the expected region of the superior cavoatrial junction. 3. Atherosclerotic vascular calcifications. Electronically signed by: Greig Pique MD MD 05/25/2024 08:22 PM EST RP  Workstation: [id number]        Scheduled Meds:  amLODipine   2.5 mg Oral Daily   ascorbic acid   500 mg Oral Daily   budesonide   0.5 mg Nebulization BID   Chlorhexidine  Gluconate Cloth  6 each Topical Daily   chlorpheniramine-HYDROcodone   5 mL Oral Q12H   donepezil   5 mg Oral QHS   ezetimibe   10 mg Oral QHS   And   simvastatin   20 mg Oral QHS   feeding supplement  237 mL Oral BID BM   fluconazole   50 mg Oral Daily   Gerhardt's butt cream   Topical BID   guaiFENesin   10 mL Oral Q8H   insulin  aspart  0-20 Units Subcutaneous TID WC   insulin  aspart  0-5 Units Subcutaneous QHS   insulin  aspart  5 Units Subcutaneous TID WC   insulin  glargine-yfgn  20 Units Subcutaneous QHS   ipratropium-albuterol   3 mL Nebulization Q6H   lactobacillus  1 g Oral TID WC   latanoprost   1 drop Both Eyes QHS   levothyroxine   50 mcg Oral See admin instructions   loratadine   10 mg Oral Daily   magnesium  oxide  400 mg Oral Daily   metoprolol  succinate  37.5 mg Oral Q12H   multivitamin with minerals  1 tablet Oral Daily   pantoprazole   40 mg Oral Daily   polyethylene glycol  17 g Oral BID   sodium chloride  flush  3 mL Intravenous Q12H   sodium chloride  flush  3 mL Intravenous Q12H   sodium zirconium cyclosilicate   10 g Oral Once   thiamine   100 mg Oral Daily   zinc  sulfate (50mg  elemental zinc )  220 mg Oral Daily  Continuous Infusions:  ceFEPime  (MAXIPIME ) IV 2 g (05/27/24 0819)     LOS: 9 days    Time spent: over 30 min    Meliton Monte, MD [facility]   To contact the attending provider between 7A-7P or the covering provider during after hours 7P-7A, please log into the web site [email or web address] and access using universal [place] password for that web site. If you do not have the password, please call the hospital operator.  05/27/2024, 9:34 AM    "

## 2024-05-27 NOTE — Telephone Encounter (Signed)
 Spoke to the patient's family. 05/26/23 -  Friday 6 p,  I have addressed end of life issues also. They are waiting on another daughter to come from [place]. Do not think intubation is a good option if she does not recover with medical therapy.  They also expressed concerns about nursing care on 2 W.  Patient being transferred to 4E.      Gordy Bergamo, MD, [facility] 05/27/2024, 8:07 AM [facility] [address] [place], [place] [address] Phone: [phone]. Fax:  [phone]

## 2024-05-27 NOTE — Progress Notes (Signed)
 "  Palliative Medicine Inpatient Follow Up Note HPI: Hannah [name] is an 89 year old female with a past medical history significant for heart failure with preserved ejection fraction, hypertension, COPD, hypothyroidism, stage III chronic kidney disease, and type 2 diabetes mellitus.  She has had a prolonged hospitalization in the setting of influenza A, diastolic heart failure, and sepsis due to MSSA bacteremia/pneumonia.  Palliative care is involved for ongoing goals of care conversations.  Today's Discussion 05/27/2024  I reviewed the chart notes including nursing notes from [facility], progress notes from Dr.Opyd, Dr. Ladona, and Dr. [name]. I also reviewed vital signs - increased oxygen need of 9 L/min, nursing flowsheets - has had declining oral intake for the past few days, medication administrations record, labs -white blood cell count of 23.9 this morning, and imaging CT abdomen and pelvis 1/10 multifocal pneumonia and soft tissue hematoma in sacral area.    I met with patient's 3 daughters and granddaughter this afternoon.  Patients beloved dog was present at bedside. We discussed the clinical changes which Hannah [name] has endured over the past few days.  We reviewed the family's decision to transition her cardiopulmonary resuscitation status to DNR/DNI.  We reviewed that they would like to give her a few days with antibiotics and IV fluids to see if improvement may occur though they understand that her body is fragile and weak. They want to make sure over the next few days that she is not distressed and are in agreement with some comfort focuses medications.  I was able to include information on what full comfort measures look like when family feel it is time to make that the primary focus. We talked about transition to comfort measures in house and what that would entail inclusive of medications to control pain, dyspnea, agitation, nausea, itching, and hiccups.  We discussed stopping all  uneccessary measures such as mIVF, IV antibiotics, cardiac monitoring, blood draws, needle sticks, and frequent vital signs.   Patient's family question if they should have been more aggressive in November when she had initially started treatment for pneumonia.  We reflected upon the reality that it is easier to look back and think of what should have been done.  I allowed them time and Ronnald to express themselves and offered support through therapeutic listening. Created space and opportunity for patients family to explore thoughts feelings and fears regarding Hannah [name]'s medical situation.  I shared the beauty and seeing Lenette's family together and how I am glad they have each other during this difficult time.  Questions and concerns addressed/Palliative Support Provided.   Objective Assessment: Vital Signs Vitals:   05/27/24 0705 05/27/24 1142  BP: (!) 98/59 (!) 106/59  Pulse: 99 92  Resp: 14 15  Temp: 99.1 F (37.3 C) 99 F (37.2 C)  SpO2: 97% 93%    Intake/Output Summary (Last 24 hours) at 05/27/2024 1421 Last data filed at 05/26/2024 2210 Gross per 24 hour  Intake 1021.61 ml  Output 800 ml  Net 221.61 ml   Last Weight  Most recent update: 05/27/2024  3:33 AM    Weight  41 kg (90 lb 6.2 oz)            Gen: Elderly ill-appearing Indian female HEENT: Oral thrush, dry mucous membranes CV: Regular rate and rhythm PULM: On 9L nasal cannula - though this is out of her nose for comfort, breathing nonlabored ABD: soft/nontender  EXT:   (+) pedal edema Neuro: Arouses with loud noises and appear startled  SUMMARY  OF RECOMMENDATIONS   DNAR/DNI  Continue mIVF and IV antibiotics for 48 hours  Comfort medications such as dilaudid  IV, morphine  PO, ativan , & glycopyrrolate  to be given as needed  Emotional support provided  Ongoing PMT support  ______________________________________________________________________________________ Hannah [name] [facility] Health Palliative  Medicine Team Team Cell Phone: [phone] Please utilize secure chat with additional questions, if there is no response within 30 minutes please call the above phone number  I personally spent a total of 35 minutes in the care of the patient today including preparing to see the patient, getting/reviewing separately obtained history, performing a medically appropriate exam/evaluation, counseling and educating, referring and communicating with other health care professionals, documenting clinical information in the EHR, and coordinating care.     "

## 2024-05-27 NOTE — Plan of Care (Signed)
  Problem: Education: Goal: Ability to describe self-care measures that may prevent or decrease complications (Diabetes Survival Skills Education) will improve Outcome: Not Progressing Goal: Individualized Educational Video(s) Outcome: Not Progressing   Problem: Coping: Goal: Ability to adjust to condition or change in health will improve Outcome: Not Progressing   Problem: Fluid Volume: Goal: Ability to maintain a balanced intake and output will improve Outcome: Not Progressing   Problem: Health Behavior/Discharge Planning: Goal: Ability to identify and utilize available resources and services will improve Outcome: Not Progressing Goal: Ability to manage health-related needs will improve Outcome: Not Progressing   Problem: Nutritional: Goal: Maintenance of adequate nutrition will improve Outcome: Not Progressing Goal: Progress toward achieving an optimal weight will improve Outcome: Not Progressing

## 2024-05-28 ENCOUNTER — Other Ambulatory Visit (HOSPITAL_COMMUNITY): Payer: Self-pay

## 2024-05-28 DIAGNOSIS — Z7189 Other specified counseling: Secondary | ICD-10-CM | POA: Diagnosis not present

## 2024-05-28 DIAGNOSIS — J189 Pneumonia, unspecified organism: Secondary | ICD-10-CM | POA: Diagnosis not present

## 2024-05-28 DIAGNOSIS — A419 Sepsis, unspecified organism: Secondary | ICD-10-CM | POA: Diagnosis not present

## 2024-05-28 DIAGNOSIS — Z711 Person with feared health complaint in whom no diagnosis is made: Secondary | ICD-10-CM

## 2024-05-28 DIAGNOSIS — Z515 Encounter for palliative care: Secondary | ICD-10-CM | POA: Diagnosis not present

## 2024-05-28 LAB — BASIC METABOLIC PANEL WITH GFR
Anion gap: 8 (ref 5–15)
BUN: 30 mg/dL — ABNORMAL HIGH (ref 8–23)
CO2: 24 mmol/L (ref 22–32)
Calcium: 8.3 mg/dL — ABNORMAL LOW (ref 8.9–10.3)
Chloride: 107 mmol/L (ref 98–111)
Creatinine, Ser: 1.25 mg/dL — ABNORMAL HIGH (ref 0.44–1.00)
GFR, Estimated: 41 mL/min — ABNORMAL LOW
Glucose, Bld: 228 mg/dL — ABNORMAL HIGH (ref 70–99)
Potassium: 4.9 mmol/L (ref 3.5–5.1)
Sodium: 139 mmol/L (ref 135–145)

## 2024-05-28 LAB — BPAM RBC
Blood Product Expiration Date: 202601312359
ISSUE DATE / TIME: 202601101654
Unit Type and Rh: 6200

## 2024-05-28 LAB — TYPE AND SCREEN
ABO/RH(D): A POS
Antibody Screen: NEGATIVE
Unit division: 0

## 2024-05-28 LAB — MAGNESIUM: Magnesium: 2.5 mg/dL — ABNORMAL HIGH (ref 1.7–2.4)

## 2024-05-28 LAB — CBC
HCT: 25.2 % — ABNORMAL LOW (ref 36.0–46.0)
Hemoglobin: 8.3 g/dL — ABNORMAL LOW (ref 12.0–15.0)
MCH: 29.2 pg (ref 26.0–34.0)
MCHC: 32.9 g/dL (ref 30.0–36.0)
MCV: 88.7 fL (ref 80.0–100.0)
Platelets: 170 K/uL (ref 150–400)
RBC: 2.84 MIL/uL — ABNORMAL LOW (ref 3.87–5.11)
RDW: 18.8 % — ABNORMAL HIGH (ref 11.5–15.5)
WBC: 17 K/uL — ABNORMAL HIGH (ref 4.0–10.5)
nRBC: 1.2 % — ABNORMAL HIGH (ref 0.0–0.2)

## 2024-05-28 LAB — GLUCOSE, CAPILLARY
Glucose-Capillary: 202 mg/dL — ABNORMAL HIGH (ref 70–99)
Glucose-Capillary: 202 mg/dL — ABNORMAL HIGH (ref 70–99)
Glucose-Capillary: 127 mg/dL — ABNORMAL HIGH (ref 70–99)
Glucose-Capillary: 83 mg/dL (ref 70–99)

## 2024-05-28 LAB — PHOSPHORUS: Phosphorus: 4.2 mg/dL (ref 2.5–4.6)

## 2024-05-28 NOTE — Progress Notes (Signed)
 " PROGRESS NOTE    Hannah [name]  FMW:[id number] DOB: [birth year] DOA: 05/17/2024 PCP: Sun, Vyvyan, MD  Chief Complaint  Patient presents with   Shortness of Breath   Altered Mental Status    Brief Narrative:   89 year old female with past medical history significant for HFpEF, hypertension, COPD, hypothyroidism, chronic kidney disease stage III and type 2 diabetes mellitus who presented with worsening shortness of breath and edema.   She's been diagnosed with influenza, MSSA pneumonia, and MSSA bacteremia.    She's been seen by ID.  Cardiology was c/s for SVT.    She's declined over the past few days.  Now requiring 8 L [place].  CT with multifocal pneumonia.    Goals of care with family ongoing.  Assessment & Plan:   Principal Problem:   Sepsis due to pneumonia [facility]) Active Problems:   Acute hypoxic respiratory failure (HCC)   Influenza Hannah [name] with pneumonia   Chronic obstructive asthma (HCC)   Hypertension   Insulin  dependent type 2 diabetes mellitus (HCC)   CKD stage 3a, GFR 45-59 ml/min (HCC)   Iron  deficiency anemia   UTI (urinary tract infection)   Hypothyroidism   Protein-calorie malnutrition, severe  Goals of Care 3 daughters and granddaughter (Hannah [name] careers adviser abroad) present at bedside.  We've decided on DNR/DNI and monitoring how she does on antibiotics over the next 24 hrs or so (yesterday and today).  Additional decisions to be made on possible transition to comfort measures pending her progress on abx.  We're also trying to ensure her comfort at this time - prn ativan , morphine , robinul , dilaudid .  Will keep her on 4 east for the time being.  If she is transferred to med surg again, they do not want to go to 2w (consider 5n/6n).   Acute hypoxic respiratory failure Influenza Hannah [name] with MSSA pneumonia MSSA bacteremia Sepsis Repeat CT PE protocol 1/10 with multifocal pneumonia - tree in bud nodular opacities bilaterally, increased from prior exam.  Patchy airspace disease in mid  to lower lung filed on L and RLL.  1/1 blood culture with staph aureus and abiotrophia defectiva (suspected contaminant, but follow follow up culture) 1/3 blood culture NGTDx5 Appreciate ID recommendations - continue ancef  while inpatient with goal of 7 days of IV abx and then transition to linezolid  600 mg PO BID with total of 4 weeks of treatment through 06/16/2024. Tamiflu  1/2-1/6 TTE without notable valvular abnormalities - no plan for TEE leukocytosis partially in setting of steroid use, but fever 1/10 and worsening leukocytosis 1/9.   She met criteria for sepsis 1/11 she was transitioned to cefepime  to cover for aspiration pneumonia/HAP.  Will consider deescalating in next day or so.   Acute Metabolic Encephalopathy Dementia VBG without hypercarbia B12, ammonia, folate all normal Suspect delirium - waxing and waning recently Minimize deliriogenic meds Delirium precautions  Anemia Suspected Hematoma posterior to Sacrum Drop from 9 -> 7.0 on 1/10  S/p 1 unit pRBC - hb is 8.3 today, more consistent with what I would expect after 1 unit pRBC Hyperdense collection posterior to sacrum in subcutaneous soft tissues measuring 4.5 x 1.6 x 1.2 cm Normal b12, folate.  Elevated ferritin, low iron . Heparin  currently on hold  Left Lower Extremity DVT On 1/8 US  Heparin  gtt on hold with drop in hb and suspected hematoma noted above  Intermittent SVT: Short intermittent runs increased metop to 37.5 mg BID - will monitor Appreciate cardiology assistance - they've signed off 1/7 (see note).  Replace lytes  to maintain K>4 and mag >2.  AKI  Hyperkalemia Received lasix  1/10.  Also pRBC.  Sepsis above as well. Improved today with IVF, trend  Hypertension BP on low side Hold amlodipine  Metop to continue with holding parameters  Concern for Poor UOP Urinary Retention Now s/p foley placement 1/9 800 cc out yesterday Oxybutynin  on hold 1/9 Consider trial of void in Hannah [name] few days  Possible  UTI: No urine culture collected CT with bladder wall thickening, perivesicular fat stranding - infectious/inflammatory UA not suggestive of UTI - negative nitrites, negative leukocytes, 0-5 RBC, 6-10 WBC, no bacteria UA at presentation with +LE/nitrites - watch for UTI symptoms She's on cefepime  which should cover most typical causes of urine infection  Concern for Possible Dysphagia Thrush SLP eval, appreciate assistance fluconazole  Patient has history of oral cancer.  HFpEF Echo 05/18/2024 revealed normal left ventricular ejection fraction, with grade 1 diastolic dysfunction. Appears euvolemic to dry Cardiology now signed of 1/7.   Will diurese as needed   AKI Aki from presentation has improved  Left 2nd Toe Injury Plain films unremarkable Supportive care  Dementia: Delirium precautions Continue Aricept .   T2DM Steroid induced hyperglycemia Basal, bolus, SSI - adjust as needed Steroids have been d/c'd Monitor  Loose watery diarrhea: + c diff antigen, negative PCR.  C diff ruled out with ID assistance, appreciate assistance - suspected abx associated diarrhea   Hypokalemia Hypomagnesemia Hypophosphatemia Follow and replace as needed   Poor PO intake Severe Protein Calorie Malnutrition RD Hold megace  - should probably be discontinued indefinitely based on VTE above Body mass index is 17.65 kg/m.  Bilateral LE Pain Described as cramping, unclear cause on 1/9 Seems resolved today palpable bilateral pulses.  No notable masses or visible abnormalities.  Compartments soft.   Heat Rash Suspect this is rash on her bottom Will monitor, supportive care  Severe Protein Calorie Malnutrition Body mass index is 17.65 kg/m.  Concern regarding frequent blood draws.  S/p PICC placement 1/8    DVT prophylaxis: heparin  Code Status: full Family Communication: none Disposition:   Status is: Inpatient Remains inpatient appropriate because: need for continued  inpatient care   Consultants:  Palliative care cardiology  Procedures:  Echo IMPRESSIONS     1. Left ventricular ejection fraction, by estimation, is 60 to 65%. The  left ventricle has normal function. The left ventricle has no regional  wall motion abnormalities. Left ventricular diastolic parameters are  consistent with Grade I diastolic  dysfunction (impaired relaxation).   2. Right ventricular systolic function is normal. The right ventricular  size is normal.   3. The mitral valve is normal in structure. Mild mitral valve  regurgitation. No evidence of mitral stenosis.   4. The aortic valve is tricuspid. Aortic valve regurgitation is not  visualized. No aortic stenosis is present.   5. The inferior vena cava is normal in size with greater than 50%  respiratory variability, suggesting right atrial pressure of 3 mmHg.   Antimicrobials:  Anti-infectives (From admission, onward)    Start     Dose/Rate Route Frequency Ordered Stop   05/28/24 0900  ceFEPIme  (MAXIPIME ) 2 g in sodium chloride  0.9 % 100 mL IVPB        2 g 200 mL/hr over 30 Minutes Intravenous Every 24 hours 05/27/24 1503     05/26/24 2100  ceFEPIme  (MAXIPIME ) 2 g in sodium chloride  0.9 % 100 mL IVPB  Status:  Discontinued        2 g 200 mL/hr  over 30 Minutes Intravenous Every 12 hours 05/26/24 1943 05/27/24 1503   05/26/24 0915  cefTRIAXone  (ROCEPHIN ) 2 g in sodium chloride  0.9 % 100 mL IVPB  Status:  Discontinued        2 g 200 mL/hr over 30 Minutes Intravenous Every 24 hours 05/26/24 0822 05/26/24 1943   05/25/24 1000  fluconazole  (DIFLUCAN ) tablet 50 mg       Placed in Followed by Linked Group   50 mg Oral Daily 05/24/24 1049 06/03/24 0959   05/24/24 1145  fluconazole  (DIFLUCAN ) tablet 100 mg       Placed in Followed by Linked Group   100 mg Oral  Once 05/24/24 1049 05/24/24 1234   05/24/24 0000  linezolid  (ZYVOX ) 600 MG tablet        600 mg Oral 2 times daily 05/22/24 1502 06/14/24 2359   05/21/24  1230  fluconazole  (DIFLUCAN ) tablet 50 mg  Status:  Discontinued        50 mg Oral Daily 05/21/24 1140 05/21/24 1432   05/18/24 1829  oseltamivir  (TAMIFLU ) capsule 30 mg        30 mg Oral Daily 05/18/24 1829 05/22/24 0941   05/18/24 1545  ceFAZolin  (ANCEF ) IVPB 2g/100 mL premix  Status:  Discontinued        2 g 200 mL/hr over 30 Minutes Intravenous Every 12 hours 05/18/24 1451 05/26/24 0822   05/18/24 1415  azithromycin  (ZITHROMAX ) 500 mg in sodium chloride  0.9 % 250 mL IVPB  Status:  Discontinued        500 mg 250 mL/hr over 60 Minutes Intravenous  Once 05/18/24 1326 05/18/24 1332   05/18/24 1415  cefTRIAXone  (ROCEPHIN ) 1 g in sodium chloride  0.9 % 100 mL IVPB  Status:  Discontinued        1 g 200 mL/hr over 30 Minutes Intravenous Every 24 hours 05/18/24 1326 05/18/24 1334   05/17/24 2200  oseltamivir  (TAMIFLU ) capsule 30 mg  Status:  Discontinued        30 mg Oral 2 times daily 05/17/24 2025 05/18/24 1829   05/17/24 2100  ceFEPIme  (MAXIPIME ) 2 g in sodium chloride  0.9 % 100 mL IVPB  Status:  Discontinued        2 g 200 mL/hr over 30 Minutes Intravenous Every 24 hours 05/17/24 2057 05/18/24 1451   05/17/24 2100  vancomycin  (VANCOCIN ) IVPB 1000 mg/200 mL premix        1,000 mg 200 mL/hr over 60 Minutes Intravenous  Once 05/17/24 2057 05/17/24 2324   05/17/24 2056  vancomycin  variable dose per unstable renal function (pharmacist dosing)  Status:  Discontinued         Does not apply See admin instructions 05/17/24 2057 05/18/24 1451   05/17/24 1900  cefTRIAXone  (ROCEPHIN ) 1 g in sodium chloride  0.9 % 100 mL IVPB        1 g 200 mL/hr over 30 Minutes Intravenous  Once 05/17/24 1849 05/17/24 1940   05/17/24 1900  azithromycin  (ZITHROMAX ) 500 mg in sodium chloride  0.9 % 250 mL IVPB        500 mg 250 mL/hr over 60 Minutes Intravenous  Once 05/17/24 1849 05/17/24 2115       Subjective:  3 daughters and granddaughter at bedside Did well with ativan   Objective: Vitals:   05/27/24 2136  05/28/24 0119 05/28/24 0754 05/28/24 0821  BP: 127/67   138/68  Pulse: 94 83 80 93  Resp: 13 17 16 16   Temp:    98.3 F (36.8  C)  TempSrc:    Oral  SpO2: 99% 93% 94% 94%  Weight:      Height:        Intake/Output Summary (Last 24 hours) at 05/28/2024 1005 Last data filed at 05/27/2024 2005 Gross per 24 hour  Intake 116.01 ml  Output 300 ml  Net -183.99 ml   Filed Weights   05/25/24 0407 05/26/24 0346 05/27/24 0333  Weight: 46.3 kg 40.9 kg 41 kg    Examination:  Limited exam due to focus on comfort as we see how she does on abx  General: comfortable appearing today, frail Cardiovascular: sinus on telemetry Lungs: unlabored on no supplemental O2 Neurological: sleeping.   Data Reviewed: I have personally reviewed following labs and imaging studies  CBC: Recent Labs  Lab 05/24/24 0853 05/25/24 0245 05/26/24 0210 05/26/24 0917 05/26/24 1605 05/27/24 0325 05/28/24 0637  WBC 27.3* 20.8* 23.7*  --   --  23.9* 17.0*  NEUTROABS 19.0*  --   --   --   --   --   --   HGB 10.4* 9.0* 7.4* 7.6* 7.0* 10.9* 8.3*  HCT 31.3* 28.3* 22.9* 22.8* 21.8* 31.7* 25.2*  MCV 88.2 92.2 89.8  --   --  86.6 88.7  PLT 340 290 240  --   --  217 170    Basic Metabolic Panel: Recent Labs  Lab 05/24/24 0853 05/25/24 0245 05/26/24 0210 05/27/24 0325 05/27/24 1514 05/28/24 0637  NA 134* 137 133* 135 137 139  K 4.9 4.8 5.0 5.6* 5.3* 4.9  CL 101 104 100 100 105 107  CO2 24 28 28 26 24 24   GLUCOSE 168* 80 123* 109* 319* 228*  BUN 18 20 19  24* 30* 30*  CREATININE 0.95 0.88 0.78 1.29* 1.48* 1.25*  CALCIUM  8.3* 8.3* 8.2* 8.5* 8.0* 8.3*  MG 2.3 2.1 1.9 2.0  --  2.5*  PHOS 1.8* 1.3* 2.2* 3.1  --  4.2    GFR: Estimated Creatinine Clearance: 19.7 mL/min (Hero Mccathern) (by C-G formula based on SCr of 1.25 mg/dL (H)).  Liver Function Tests: Recent Labs  Lab 05/23/24 0233 05/24/24 0853 05/25/24 0245 05/26/24 0210 05/27/24 0325  AST  --  45* 34 33 43*  ALT  --  18 15 10 7   ALKPHOS  --  46 52 189*  53  BILITOT  --  0.3 0.3 0.3 0.5  PROT  --  5.4* 4.8* 4.5* 5.2*  ALBUMIN 3.1* 3.2* 2.7* 2.5* 2.5*    CBG: Recent Labs  Lab 05/27/24 0620 05/27/24 1139 05/27/24 1716 05/27/24 2133 05/28/24 0821  GLUCAP 122* 191* 168* 180* 202*     Recent Results (from the past 240 hours)  Blood Culture ID Panel (Reflexed)     Status: None   Collection Time: 05/18/24  7:15 PM  Result Value Ref Range Status   Enterococcus faecalis NOT DETECTED NOT DETECTED Final   Enterococcus Faecium NOT DETECTED NOT DETECTED Final   Listeria monocytogenes NOT DETECTED NOT DETECTED Final   Staphylococcus species NOT DETECTED NOT DETECTED Final   Staphylococcus aureus (BCID) NOT DETECTED NOT DETECTED Final   Staphylococcus epidermidis NOT DETECTED NOT DETECTED Final   Staphylococcus lugdunensis NOT DETECTED NOT DETECTED Final   Streptococcus species NOT DETECTED NOT DETECTED Final   Streptococcus agalactiae NOT DETECTED NOT DETECTED Final   Streptococcus pneumoniae NOT DETECTED NOT DETECTED Final   Streptococcus pyogenes NOT DETECTED NOT DETECTED Final   Tereka Thorley.calcoaceticus-baumannii NOT DETECTED NOT DETECTED Final   Bacteroides fragilis NOT DETECTED  NOT DETECTED Final   Enterobacterales NOT DETECTED NOT DETECTED Final   Enterobacter cloacae complex NOT DETECTED NOT DETECTED Final   Escherichia coli NOT DETECTED NOT DETECTED Final   Klebsiella aerogenes NOT DETECTED NOT DETECTED Final   Klebsiella oxytoca NOT DETECTED NOT DETECTED Final   Klebsiella pneumoniae NOT DETECTED NOT DETECTED Final   Proteus species NOT DETECTED NOT DETECTED Final   Salmonella species NOT DETECTED NOT DETECTED Final   Serratia marcescens NOT DETECTED NOT DETECTED Final   Haemophilus influenzae NOT DETECTED NOT DETECTED Final   Neisseria meningitidis NOT DETECTED NOT DETECTED Final   Pseudomonas aeruginosa NOT DETECTED NOT DETECTED Final   Stenotrophomonas maltophilia NOT DETECTED NOT DETECTED Final   Candida albicans NOT DETECTED  NOT DETECTED Final   Candida auris NOT DETECTED NOT DETECTED Final   Candida glabrata NOT DETECTED NOT DETECTED Final   Candida krusei NOT DETECTED NOT DETECTED Final   Candida parapsilosis NOT DETECTED NOT DETECTED Final   Candida tropicalis NOT DETECTED NOT DETECTED Final   Cryptococcus neoformans/gattii NOT DETECTED NOT DETECTED Final    Comment: Performed at [facility], [address] [place], [place] [address]  Culture, blood (Routine X 2) w Reflex to ID Panel     Status: None   Collection Time: 05/19/24  9:04 AM   Specimen: BLOOD LEFT ARM  Result Value Ref Range Status   Specimen Description BLOOD LEFT ARM  Final   Special Requests   Final    BOTTLES DRAWN AEROBIC AND ANAEROBIC Blood Culture adequate volume   Culture   Final    NO GROWTH 5 DAYS Performed at [facility], [address], [place], [place] [address]    Report Status 05/24/2024 FINAL  Final  Culture, blood (Routine X 2) w Reflex to ID Panel     Status: None   Collection Time: 05/19/24  9:12 AM   Specimen: BLOOD LEFT HAND  Result Value Ref Range Status   Specimen Description BLOOD LEFT HAND  Final   Special Requests   Final    BOTTLES DRAWN AEROBIC AND ANAEROBIC Blood Culture results may not be optimal due to an inadequate volume of blood received in culture bottles   Culture   Final    NO GROWTH 5 DAYS Performed at [facility], [address] [place], [place] [address]    Report Status 05/24/2024 FINAL  Final  C Difficile Quick Screen w PCR reflex     Status: Abnormal   Collection Time: 05/19/24  8:29 PM   Specimen: STOOL  Result Value Ref Range Status   C Diff antigen POSITIVE (Tatsuya Okray) NEGATIVE Final   C Diff toxin NEGATIVE NEGATIVE Final   C Diff interpretation Results are indeterminate. See PCR results.  Final    Comment: Performed at [facility], [address], [place], [place] [address]  C. Diff by PCR, Reflexed     Status: None   Collection Time: 05/19/24  8:29 PM  Result  Value Ref Range Status   Toxigenic C. Difficile by PCR NEGATIVE NEGATIVE Final    Comment: Patient is colonized with non toxigenic C. difficile. May not need treatment unless significant symptoms are present.   Hypervirulent Strain PRESUMPTIVE NEGATIVE PRESUMPTIVE NEGATIVE Final    Comment: Performed at [facility], [address], [place], [place] [address]  Gastrointestinal Panel by PCR , Stool     Status: None   Collection Time: 05/20/24 12:10 AM  Specimen: Stool  Result Value Ref Range Status   Campylobacter species NOT DETECTED NOT DETECTED Final   Plesimonas shigelloides NOT DETECTED NOT DETECTED Final   Salmonella species NOT DETECTED NOT DETECTED Final   Yersinia enterocolitica NOT DETECTED NOT DETECTED Final   Vibrio species NOT DETECTED NOT DETECTED Final   Vibrio cholerae NOT DETECTED NOT DETECTED Final   Enteroaggregative E coli (EAEC) NOT DETECTED NOT DETECTED Final   Enteropathogenic E coli (EPEC) NOT DETECTED NOT DETECTED Final   Enterotoxigenic E coli (ETEC) NOT DETECTED NOT DETECTED Final   Shiga like toxin producing E coli (STEC) NOT DETECTED NOT DETECTED Final   Shigella/Enteroinvasive E coli (EIEC) NOT DETECTED NOT DETECTED Final   Cryptosporidium NOT DETECTED NOT DETECTED Final   Cyclospora cayetanensis NOT DETECTED NOT DETECTED Final   Entamoeba histolytica NOT DETECTED NOT DETECTED Final   Giardia lamblia NOT DETECTED NOT DETECTED Final   Adenovirus F40/41 NOT DETECTED NOT DETECTED Final   Astrovirus NOT DETECTED NOT DETECTED Final   Norovirus GI/GII NOT DETECTED NOT DETECTED Final   Rotavirus Mung Rinker NOT DETECTED NOT DETECTED Final   Sapovirus (I, II, IV, and V) NOT DETECTED NOT DETECTED Final    Comment: Performed at [facility], [address] [place], [place] [address]  Culture, MAINE Urine     Status: None   Collection Time: 05/25/24  1:19 AM   Specimen: Urine, Random  Result Value Ref Range Status   Specimen Description URINE, RANDOM  Final    Special Requests NONE  Final   Culture   Final    NO GROWTH NO GROUP B STREP (S.AGALACTIAE) ISOLATED Performed at [facility], [address], [place], [place] [address]    Report Status 05/26/2024 FINAL  Final  MRSA Next Gen by PCR, Nasal     Status: None   Collection Time: 05/26/24  8:00 PM   Specimen: Nasal Mucosa; Nasal Swab  Result Value Ref Range Status   MRSA by PCR Next Gen NOT DETECTED NOT DETECTED Final    Comment: (NOTE) The GeneXpert MRSA Assay (FDA approved for NASAL specimens only), is one component of Nile Dorning comprehensive MRSA colonization surveillance program. It is not intended to diagnose MRSA infection nor to guide or monitor treatment for MRSA infections. Test performance is not FDA approved in patients less than 15 years old. Performed at [facility], [address] [place], [place] [address]          Radiology Studies: CT Angio Chest Pulmonary Embolism (PE) W or WO Contrast Result Date: 05/26/2024 CLINICAL DATA:  Abdominal pain, acute, nonlocalized. Chronic dyspnea, chest wall or pleural disease suspected. EXAM: CT ANGIOGRAPHY CHEST CT ABDOMEN AND PELVIS WITH CONTRAST TECHNIQUE: Multidetector CT imaging of the chest was performed using the standard protocol during bolus administration of intravenous contrast. Multiplanar CT image reconstructions and MIPs were obtained to evaluate the vascular anatomy. Multidetector CT imaging of the abdomen and pelvis was performed using the standard protocol during bolus administration of intravenous contrast. RADIATION DOSE REDUCTION: This exam was performed according to the departmental dose-optimization program which includes automated exposure control, adjustment of the mA and/or kV according to patient size and/or use of iterative reconstruction technique. CONTRAST:  75mL OMNIPAQUE  IOHEXOL  350 MG/ML SOLN COMPARISON:  05/23/2024. FINDINGS: CTA CHEST FINDINGS Cardiovascular: The heart is mildly enlarged and there is Keshonna Valvo  trace pericardial effusion. There is atherosclerotic calcification of the aorta without evidence of aneurysm. The pulmonary trunk is normal in caliber. No  evidence of pulmonary embolism is seen. Evaluation of the pulmonary arteries is limited due to respiratory motion artifact. Mediastinum/Nodes: Prominent lymph nodes are present in the mediastinum measuring up to 1.1 cm in the subcarinal space. No axillary or mediastinal lymphadenopathy is seen. The thyroid  gland and esophagus are within normal limits. There is Keidrick Murty small hiatal hernia. Lungs/Pleura: Paraseptal and centrilobular emphysematous changes are noted in the lungs. Pleural and parenchymal scarring is present at the right lung apex. Scattered tree-in-bud nodular opacities are present in the lungs bilaterally and increased from the prior exam. There is patchy airspace disease in the mid to lower lung field on the left and in the right lower lobe. No effusion or pneumothorax is seen. Musculoskeletal: Degenerative changes are present in the thoracic spine. No acute osseous abnormality is seen. Review of the MIP images confirms the above findings. CT ABDOMEN and PELVIS FINDINGS Hepatobiliary: No focal liver abnormality is seen. No gallstones, gallbladder wall thickening, or biliary dilatation. Pancreas: Unremarkable. No pancreatic ductal dilatation or surrounding inflammatory changes. Spleen: Normal in size without focal abnormality. Adrenals/Urinary Tract: The adrenal glands are within normal limits. The kidneys enhance symmetrically. Subcentimeter hypodensities are present in the kidneys bilaterally which are too small to further characterize. No renal calculus or hydronephrosis bilaterally. Kyrollos Cordell Foley catheter is present in the urinary bladder. There is mild bladder wall thickening with perivesicular fat stranding. Stomach/Bowel: There is Hady Niemczyk small hiatal hernia. No bowel obstruction, free air, or pneumatosis is seen. Few scattered diverticula are present along the  colon without evidence of diverticulitis. Suhaila Troiano moderate amount of retained stool is present in the colon. Appendix is not seen. Vascular/Lymphatic: Aortic atherosclerosis. No enlarged abdominal or pelvic lymph nodes. Reproductive: Uterus and bilateral adnexa are unremarkable. Other: No abdominopelvic ascites. Musculoskeletal: Subcutaneous fat stranding and edema are noted in the lumbar region and right flank extending into the right hip and over the left hip, suggesting anasarca. There is Shanterria Franta focal hyperdense region in the subcutaneous fat posterior to the sacrum measuring 4.5 x 1.6 x 1.2 cm with attenuation of 65 Hounsfield units. Degenerative changes are present in the lumbar spine. No acute fracture is seen. Review of the MIP images confirms the above findings. IMPRESSION: 1. No evidence of pulmonary embolism. 2. Scattered tree-in-bud nodular densities and airspace opacities in the mid to lower lung field on the left and right lower lobe, suspicious for multifocal pneumonia. 3. Emphysema. 4. Foley catheter in place with bladder wall thickening and perivesicular fat stranding, possible infectious or inflammatory cystitis. Moderate amount of retained stool in the colon suggesting constipation. 5. Focal hyperdense collection posterior to the sacrum in the subcutaneous soft tissues measuring 4.5 x 1.6 x 1.2 cm, query hematoma. 6. Moderate amount of retained stool in the colon suggesting constipation. 7. Diverticulosis without diverticulitis. 8. Small hiatal hernia. 9. Aortic atherosclerosis. Electronically Signed   By: Leita Birmingham M.D.   On: 05/26/2024 18:53   CT ABDOMEN PELVIS W CONTRAST Result Date: 05/26/2024 CLINICAL DATA:  Abdominal pain, acute, nonlocalized. Chronic dyspnea, chest wall or pleural disease suspected. EXAM: CT ANGIOGRAPHY CHEST CT ABDOMEN AND PELVIS WITH CONTRAST TECHNIQUE: Multidetector CT imaging of the chest was performed using the standard protocol during bolus administration of intravenous  contrast. Multiplanar CT image reconstructions and MIPs were obtained to evaluate the vascular anatomy. Multidetector CT imaging of the abdomen and pelvis was performed using the standard protocol during bolus administration of intravenous contrast. RADIATION DOSE REDUCTION: This exam was performed according to the departmental dose-optimization program  which includes automated exposure control, adjustment of the mA and/or kV according to patient size and/or use of iterative reconstruction technique. CONTRAST:  75mL OMNIPAQUE  IOHEXOL  350 MG/ML SOLN COMPARISON:  05/23/2024. FINDINGS: CTA CHEST FINDINGS Cardiovascular: The heart is mildly enlarged and there is Abenezer Odonell trace pericardial effusion. There is atherosclerotic calcification of the aorta without evidence of aneurysm. The pulmonary trunk is normal in caliber. No evidence of pulmonary embolism is seen. Evaluation of the pulmonary arteries is limited due to respiratory motion artifact. Mediastinum/Nodes: Prominent lymph nodes are present in the mediastinum measuring up to 1.1 cm in the subcarinal space. No axillary or mediastinal lymphadenopathy is seen. The thyroid  gland and esophagus are within normal limits. There is Kassey Laforest small hiatal hernia. Lungs/Pleura: Paraseptal and centrilobular emphysematous changes are noted in the lungs. Pleural and parenchymal scarring is present at the right lung apex. Scattered tree-in-bud nodular opacities are present in the lungs bilaterally and increased from the prior exam. There is patchy airspace disease in the mid to lower lung field on the left and in the right lower lobe. No effusion or pneumothorax is seen. Musculoskeletal: Degenerative changes are present in the thoracic spine. No acute osseous abnormality is seen. Review of the MIP images confirms the above findings. CT ABDOMEN and PELVIS FINDINGS Hepatobiliary: No focal liver abnormality is seen. No gallstones, gallbladder wall thickening, or biliary dilatation. Pancreas:  Unremarkable. No pancreatic ductal dilatation or surrounding inflammatory changes. Spleen: Normal in size without focal abnormality. Adrenals/Urinary Tract: The adrenal glands are within normal limits. The kidneys enhance symmetrically. Subcentimeter hypodensities are present in the kidneys bilaterally which are too small to further characterize. No renal calculus or hydronephrosis bilaterally. Jennylee Uehara Foley catheter is present in the urinary bladder. There is mild bladder wall thickening with perivesicular fat stranding. Stomach/Bowel: There is Lavilla Delamora small hiatal hernia. No bowel obstruction, free air, or pneumatosis is seen. Few scattered diverticula are present along the colon without evidence of diverticulitis. Jaelan Rasheed moderate amount of retained stool is present in the colon. Appendix is not seen. Vascular/Lymphatic: Aortic atherosclerosis. No enlarged abdominal or pelvic lymph nodes. Reproductive: Uterus and bilateral adnexa are unremarkable. Other: No abdominopelvic ascites. Musculoskeletal: Subcutaneous fat stranding and edema are noted in the lumbar region and right flank extending into the right hip and over the left hip, suggesting anasarca. There is Naethan Bracewell focal hyperdense region in the subcutaneous fat posterior to the sacrum measuring 4.5 x 1.6 x 1.2 cm with attenuation of 65 Hounsfield units. Degenerative changes are present in the lumbar spine. No acute fracture is seen. Review of the MIP images confirms the above findings. IMPRESSION: 1. No evidence of pulmonary embolism. 2. Scattered tree-in-bud nodular densities and airspace opacities in the mid to lower lung field on the left and right lower lobe, suspicious for multifocal pneumonia. 3. Emphysema. 4. Foley catheter in place with bladder wall thickening and perivesicular fat stranding, possible infectious or inflammatory cystitis. Moderate amount of retained stool in the colon suggesting constipation. 5. Focal hyperdense collection posterior to the sacrum in the  subcutaneous soft tissues measuring 4.5 x 1.6 x 1.2 cm, query hematoma. 6. Moderate amount of retained stool in the colon suggesting constipation. 7. Diverticulosis without diverticulitis. 8. Small hiatal hernia. 9. Aortic atherosclerosis. Electronically Signed   By: Leita Birmingham M.D.   On: 05/26/2024 18:53        Scheduled Meds:  ascorbic acid   500 mg Oral Daily   budesonide   0.5 mg Nebulization BID   Chlorhexidine  Gluconate Cloth  6  each Topical Daily   chlorpheniramine-HYDROcodone   5 mL Oral Q12H   donepezil   5 mg Oral QHS   ezetimibe   10 mg Oral QHS   And   simvastatin   20 mg Oral QHS   feeding supplement  237 mL Oral BID BM   fluconazole   50 mg Oral Daily   Gerhardt's butt cream   Topical BID   guaiFENesin   10 mL Oral Q8H   insulin  aspart  0-20 Units Subcutaneous TID WC   insulin  aspart  0-5 Units Subcutaneous QHS   insulin  aspart  5 Units Subcutaneous TID WC   insulin  glargine-yfgn  20 Units Subcutaneous QHS   ipratropium-albuterol   3 mL Nebulization Q6H   lactobacillus  1 g Oral TID WC   latanoprost   1 drop Both Eyes QHS   levothyroxine   50 mcg Oral See admin instructions   loratadine   10 mg Oral Daily   magnesium  oxide  400 mg Oral Daily   metoprolol  succinate  37.5 mg Oral Q12H   multivitamin with minerals  1 tablet Oral Daily   pantoprazole   40 mg Oral Daily   polyethylene glycol  17 g Oral BID   sodium chloride  flush  3 mL Intravenous Q12H   sodium chloride  flush  3 mL Intravenous Q12H   sodium zirconium cyclosilicate   10 g Oral Once   thiamine   100 mg Oral Daily   zinc  sulfate (50mg  elemental zinc )  220 mg Oral Daily   Continuous Infusions:  ceFEPime  (MAXIPIME ) IV     dextrose  5 % and 0.9 % NaCl 50 mL/hr at 05/27/24 1700     LOS: 10 days    Time spent: over 30 min    Meliton Monte, MD [facility]   To contact the attending provider between 7A-7P or the covering provider during after hours 7P-7A, please log into the web site [email or web address]  and access using universal [place] password for that web site. If you do not have the password, please call the hospital operator.  05/28/2024, 10:05 AM    "

## 2024-05-28 NOTE — Progress Notes (Addendum)
" ° °  Palliative Medicine Inpatient Follow Up Note HPI: Hannah [name] is an 89 year old female with a past medical history significant for heart failure with preserved ejection fraction, hypertension, COPD, hypothyroidism, stage III chronic kidney disease, and type 2 diabetes mellitus.  She has had a prolonged hospitalization in the setting of influenza A, diastolic heart failure, and sepsis due to MSSA bacteremia/pneumonia.  Palliative care is involved for ongoing goals of care conversations.  Today's Discussion 05/28/2024  I reviewed the chart notes including nursing notes from [facility], progress notes from Dr. [name]. I also reviewed vital signs - On RA this morning, nursing flowsheets - remains to have little PO, medication administrations record reveals ativan  x2 and morphine  x1 in the last 24 hours, labs --> white blood cell count down to 17 this morning.    I spoke with patients family at bedside this morning. They share the hope to gain further insights from Dr. [name] in relation to patients diagnostic information. They share that Hannah [name] has been peaceful since receiving Ativan  last night. She prior to this was intermittently agitated and easily startled. Family feel better with Hannah [name] exemplifying peace this morning.   Reviewed  the plan for ongoing support. Patients family are thankful.   Questions and concerns addressed/Palliative Support Provided.   Objective Assessment: Vital Signs Vitals:   05/28/24 0821 05/28/24 1134  BP: 138/68 138/74  Pulse: 93 96  Resp: 16 18  Temp: 98.3 F (36.8 C) 98.8 F (37.1 C)  SpO2: 94% 94%    Intake/Output Summary (Last 24 hours) at 05/28/2024 1244 Last data filed at 05/27/2024 2005 Gross per 24 hour  Intake 116.01 ml  Output 300 ml  Net -183.99 ml   Last Weight  Most recent update: 05/27/2024  3:33 AM    Weight  41 kg (90 lb 6.2 oz)            Gen: Elderly ill-appearing Indian female HEENT: Oral thrush, dry mucous  membranes CV: Regular rate and rhythm PULM: On RA, breathing even & nonlabored ABD: soft/nontender  EXT:   (+) pedal edema Neuro: Sleeping peacefully  SUMMARY OF RECOMMENDATIONS   DNAR/DNI  Continue mIVF and IV antibiotics for 24 hours then reassess  Continue comfort medications such as dilaudid  IV, morphine  PO, ativan , & glycopyrrolate  to be given as needed  Emotional support provided  Ongoing PMT support ______________________________________________________________________________________ Hannah [name] [place] Palliative Medicine Team Team Cell Phone: [phone] Please utilize secure chat with additional questions, if there is no response within 30 minutes please call the above phone number  I personally spent a total of 27 minutes in the care of the patient today including preparing to see the patient, performing a medically appropriate exam/evaluation, counseling and educating, referring and communicating with other health care professionals, documenting clinical information in the EHR, and coordinating care.      "

## 2024-05-28 NOTE — Progress Notes (Signed)
 PT Cancellation Note  Patient Details Name: Hannah [name] MRN: [id number] DOB: 1932-07-08   Cancelled Treatment:    Reason Eval/Treat Not Completed: (P) Fatigue/lethargy limiting ability to participate, per RN pt family requesting to let pt rest as she is comfortable at this time. Will hold therapies this date and check back as schedule allows and pt appropriate to continue with PT POC.  Therisa JONELLE. PTA Acute Rehabilitation Services Office: [phone]     Therisa CHRISTELLA Boor 05/28/2024, 3:38 PM

## 2024-05-29 ENCOUNTER — Other Ambulatory Visit (HOSPITAL_COMMUNITY): Payer: Self-pay

## 2024-05-29 DIAGNOSIS — A419 Sepsis, unspecified organism: Secondary | ICD-10-CM | POA: Diagnosis not present

## 2024-05-29 DIAGNOSIS — J189 Pneumonia, unspecified organism: Secondary | ICD-10-CM | POA: Diagnosis not present

## 2024-05-29 DIAGNOSIS — Z7189 Other specified counseling: Secondary | ICD-10-CM | POA: Diagnosis not present

## 2024-05-29 DIAGNOSIS — Z515 Encounter for palliative care: Secondary | ICD-10-CM | POA: Diagnosis not present

## 2024-05-29 DIAGNOSIS — Z66 Do not resuscitate: Secondary | ICD-10-CM | POA: Diagnosis not present

## 2024-05-29 DIAGNOSIS — Z711 Person with feared health complaint in whom no diagnosis is made: Secondary | ICD-10-CM | POA: Diagnosis not present

## 2024-05-29 LAB — CBC WITH DIFFERENTIAL/PLATELET
Abs Immature Granulocytes: 2.11 K/uL — ABNORMAL HIGH (ref 0.00–0.07)
Basophils Absolute: 0.2 K/uL — ABNORMAL HIGH (ref 0.0–0.1)
Basophils Relative: 1 %
Eosinophils Absolute: 0.1 K/uL (ref 0.0–0.5)
Eosinophils Relative: 0 %
HCT: 25.8 % — ABNORMAL LOW (ref 36.0–46.0)
Hemoglobin: 8.6 g/dL — ABNORMAL LOW (ref 12.0–15.0)
Immature Granulocytes: 11 %
Lymphocytes Relative: 6 %
Lymphs Abs: 1 K/uL (ref 0.7–4.0)
MCH: 30 pg (ref 26.0–34.0)
MCHC: 33.3 g/dL (ref 30.0–36.0)
MCV: 89.9 fL (ref 80.0–100.0)
Monocytes Absolute: 1.3 K/uL — ABNORMAL HIGH (ref 0.1–1.0)
Monocytes Relative: 7 %
Neutro Abs: 13.8 K/uL — ABNORMAL HIGH (ref 1.7–7.7)
Neutrophils Relative %: 75 %
Platelets: 176 K/uL (ref 150–400)
RBC: 2.87 MIL/uL — ABNORMAL LOW (ref 3.87–5.11)
RDW: 18.8 % — ABNORMAL HIGH (ref 11.5–15.5)
Smear Review: NORMAL
WBC: 18.4 K/uL — ABNORMAL HIGH (ref 4.0–10.5)
nRBC: 1 % — ABNORMAL HIGH (ref 0.0–0.2)

## 2024-05-29 LAB — COMPREHENSIVE METABOLIC PANEL WITH GFR
ALT: 12 U/L (ref 0–44)
AST: 37 U/L (ref 15–41)
Albumin: 2.4 g/dL — ABNORMAL LOW (ref 3.5–5.0)
Alkaline Phosphatase: 75 U/L (ref 38–126)
Anion gap: 9 (ref 5–15)
BUN: 20 mg/dL (ref 8–23)
CO2: 23 mmol/L (ref 22–32)
Calcium: 8.6 mg/dL — ABNORMAL LOW (ref 8.9–10.3)
Chloride: 109 mmol/L (ref 98–111)
Creatinine, Ser: 0.9 mg/dL (ref 0.44–1.00)
GFR, Estimated: >60 mL/min
Glucose, Bld: 172 mg/dL — ABNORMAL HIGH (ref 70–99)
Potassium: 4.7 mmol/L (ref 3.5–5.1)
Sodium: 140 mmol/L (ref 135–145)
Total Bilirubin: 0.4 mg/dL (ref 0.0–1.2)
Total Protein: 5.5 g/dL — ABNORMAL LOW (ref 6.5–8.1)

## 2024-05-29 LAB — GLUCOSE, CAPILLARY
Glucose-Capillary: 157 mg/dL — ABNORMAL HIGH (ref 70–99)
Glucose-Capillary: 171 mg/dL — ABNORMAL HIGH (ref 70–99)
Glucose-Capillary: 150 mg/dL — ABNORMAL HIGH (ref 70–99)

## 2024-05-29 LAB — PHOSPHORUS: Phosphorus: 2.8 mg/dL (ref 2.5–4.6)

## 2024-05-29 LAB — MAGNESIUM: Magnesium: 2.2 mg/dL (ref 1.7–2.4)

## 2024-05-29 MED ORDER — POLYVINYL ALCOHOL 1.4 % OP SOLN: 1.0000 [drp] | Freq: Four times a day (QID) | OPHTHALMIC | Status: DC | PRN

## 2024-05-29 MED ORDER — HYDROCOD POLI-CHLORPHE POLI ER 10-8 MG/5ML PO SUER
5.0000 mL | Freq: Two times a day (BID) | ORAL | Status: DC | PRN
  Administered 2024-05-30 – 2024-06-01 (×3): 5 mL via ORAL
  Filled 2024-05-29 (×3): qty 5

## 2024-05-29 MED ORDER — GUAIFENESIN 100 MG/5ML PO LIQD: 10.0000 mL | Freq: Three times a day (TID) | ORAL | Status: DC | PRN

## 2024-05-29 MED ORDER — BIOTENE DRY MOUTH MT LIQD: 15.0000 mL | OROMUCOSAL | Status: DC | PRN

## 2024-05-29 NOTE — Progress Notes (Signed)
 "  Palliative Medicine Inpatient Follow Up Note HPI: Hannah [name] is an 89 year old female with a past medical history significant for heart failure with preserved ejection fraction, hypertension, COPD, hypothyroidism, stage III chronic kidney disease, and type 2 diabetes mellitus.  She has had a prolonged hospitalization in the setting of influenza A, diastolic heart failure, and sepsis due to MSSA bacteremia/pneumonia.  Palliative care is involved for ongoing goals of care conversations.  Today's Discussion 05/29/2024  I reviewed the chart notes including nursing notes from [facility], progress notes from Dr. [name]. I also reviewed vital signs - On RA this morning, nursing flowsheets - now eating nothing, medication administrations record reveals ativan  x1 in the last 24 hours, labs --> white blood cell count up to 18.4K/uL this morning.    Meeting held this morning at patients bedside with Dr. [name], myself and patients daughter Josetta. Dr. [name] reviewed patients clinical information based off of diagnostics reports such as lab values. He shares that there has been an increase in patients WBC count. Information provided on care moving into the future and questions asked concerns what measures should and should not be considered. Education provided on why we neglect to continue mIVF and IV antibiotics in patients who are encroaching end of life. Patients daughter shares understanding.   We discussed how difficult patients clinical decline has been for patients family. We discussed potential considerations moving forward inclusive of a shift in care to full comfort emphasis. We reviewed possible transition to inpatient hospice and reviewed the local facilities which offer this versus home hospice care.   Patients daughter would like to take time to communicate the morning conversation with her family. She is also agreeable to us  meeting again later this afternoon with her sisters either present  physically or on conference call to best determine next steps in patients care. Dr. [name] affirmed nothing would change with patients care until family vocalized understanding and readiness.  _____________________________  Addendum:  Dr. [name] and I met with Britiny's three daughters this afternoon. We reviewed the above information. We reviewed the differences between hospice care in the home and in a facility.   Dr. [name] shared his recommendations in relation to potentially stopping mIVF, IV antibiotics, and non-essential medications. He shared that there is at this time no great benefit from is perspective. The thought it that focusing on comfort should be the goal. Patients family plan to consider this more among themselves. They too are keeping Kesleigh's comfort at the forefront of their minds.  Patients family have elected to tour [employer]. They too would like information from [facility] on what inpatient hospice looks like. I shared that I would touch base with the in hospital liaison to better support their questions.   Questions and concerns addressed/Palliative Support Provided.   Objective Assessment: Vital Signs Vitals:   05/29/24 0609 05/29/24 0811  BP: (!) 158/81 (!) 148/79  Pulse: 94 95  Resp: 12 15  Temp: 99.4 F (37.4 C) (!) 96.8 F (36 C)  SpO2: 96% 96%    Intake/Output Summary (Last 24 hours) at 05/29/2024 1013 Last data filed at 05/28/2024 2000 Gross per 24 hour  Intake 10 ml  Output 550 ml  Net -540 ml   Last Weight  Most recent update: 05/29/2024  5:40 AM    Weight  40 kg (88 lb 2.9 oz)            Gen: Elderly ill-appearing Indian female HEENT: Oral thrush, dry mucous membranes CV:  Regular rate and rhythm PULM: On RA, breathing even & nonlabored ABD: soft/nontender  EXT:   (+) pedal edema, (+) hand Neuro: Sleeping peacefully  SUMMARY OF RECOMMENDATIONS   DNAR/DNI  Continue mIVF and IV antibiotics for now  Continue comfort medications  such as dilaudid  IV, morphine  PO, ativan , & glycopyrrolate  to be given as needed  Open and honest conversations held with patients family in regards to her current clinical condition and expected outcomes  Patients family provided information on inpatient hospice as well as home hospice  Emotional support provided  Ongoing PMT support ______________________________________________________________________________________ Rosaline Becton [facility] Palliative Medicine Team Team Cell Phone: [phone] Please utilize secure chat with additional questions, if there is no response within 30 minutes please call the above phone number  I personally spent a total of 80 minutes in the care of the patient today including preparing to see the patient, performing a medically appropriate exam/evaluation, counseling and educating, referring and communicating with other health care professionals, documenting clinical information in the EHR, and coordinating care.      "

## 2024-05-29 NOTE — Progress Notes (Signed)
 PT Cancellation Note  Patient Details Name: Hannah [name] MRN: [id number] DOB: [birth year]   Cancelled Treatment:    Reason Eval/Treat Not Completed: Fatigue/lethargy limiting ability to participate, At length conversation had with with Daughter Hannah [name], with OT also present, who requested therapy not work with patient at this time as she is resting comfortably. Therapy team offered education in caregiver education for repositioning, rotation, and caregiver ergonomics. Family politely declining at this time. Hospice conversation is active with family, at this time PT will hold and await family decision for POC. Will likely not re-attempt therapy services until 06/01/24.   Therisa JONELLE. PTA Acute Rehabilitation Services Office: [phone]    Therisa CHRISTELLA Boor 05/29/2024, 11:26 AM

## 2024-05-29 NOTE — Progress Notes (Signed)
 OT Cancellation Note  Patient Details Name: NAILEAH KARG MRN: [id number] DOB: December 11, 1932   Cancelled Treatment:    Reason Eval/Treat Not Completed: Had at length conversation with Daughter Josetta, who requested that therapy not work with patient at this time since she is resting comfortably. Therapy team offered education in caregiver education for repositioning, rotation, and caregiver ergonomics. Family politely declining. Hospice conversation is active with family, at this time OT will hold and await family decision for POC. Will likely not re-attempt therapy services until 06/01/24.   Leita PARAS Corde Antonini 05/29/2024, 10:41 AM  Leita DEL OTR/L Acute Rehabilitation Services Office: [phone]

## 2024-05-29 NOTE — Progress Notes (Signed)
 Nutrition Follow-up  DOCUMENTATION CODES:   Severe malnutrition in context of chronic illness, Underweight  INTERVENTION:  RD to continue to monitor chart for goals of care.   If oral diet resumed, recommend continue ensure plus high protein BID to support nutrition needs.   Consider escalation of bowel regimen if goal is for continued medical management.   NUTRITION DIAGNOSIS:  Severe Malnutrition related to chronic illness (HF, COPD, CKD, DM2) as evidenced by severe fat depletion, severe muscle depletion. - remains applicable  GOAL:  Patient will meet greater than or equal to 90% of their needs - goal unmet  MONITOR:  PO intake, Supplement acceptance, Labs, Weight trends  REASON FOR ASSESSMENT:  Consult Assessment of nutrition requirement/status  ASSESSMENT:  Pt admitted with worsening shortness of breath and edema, work up with findings of flu A with PNA, bacteremia/sepsis, possible UTI and acute on chronic CKD IIIa. PMH significant for chronic HFpEF, HTN, COPD, hypothyroidism, CKD stage III, and DM2.  Reviewed chart and physician notes.  Patient continues to decline.  Goals of care discussions on going.  Palliative Care team continues to follow.   Patient's diet downgraded today to NPO; as tolerated for comfort. Previously s/p FEES with SLP on 1/8 recommending continue regular textured diet with daughter to order meals.   Meal completions: 1/2: 35% dinner 1/5: 25% lunch, 25% dinner 1/6: 10% breakfast, 20% dinner 1/8: 10% breakfast. 50% lunch  Last documented bowel movement noted to have been on 1/2. Would recommend escalation of regimen if goal remains to continue with medical management.   First measured weight (1/2): 41.5 kg Current weight: 40 kg  Medications: SSI 0-20 units TID, SSI 0-5 units at bedtime, novolog  5 units TID, semglee  20 units daily, miralax  BID, IV abx, IV D5 in .9% NaCL   Labs:  CBG's 83-171 x24 hours  Diet Order:   Diet Order              Diet NPO time specified Except for: Other (See Comments), Sips with Meds  Diet effective now                   EDUCATION NEEDS:   No education needs have been identified at this time  Skin:  Skin Assessment: Reviewed RN Assessment  Last BM:  1/2  Height:   Ht Readings from Last 1 Encounters:  05/18/24 5' (1.524 m)    Weight:   Wt Readings from Last 1 Encounters:  05/29/24 40 kg   MI:  Body mass index is 17.22 kg/m.  Estimated Nutritional Needs:   Kcal:  1200-1400  Protein:  60-70g  Fluid:  >/=1.5L  Royce Maris, RDN, LDN Clinical Nutrition See AMiON for contact information.

## 2024-05-29 NOTE — Progress Notes (Addendum)
 " PROGRESS NOTE    TAVI GAUGHRAN  FMW:[id number] DOB: 1932-08-30 DOA: 05/17/2024 PCP: Sun, Vyvyan, MD  Chief Complaint  Patient presents with   Shortness of Breath   Altered Mental Status    Brief Narrative:   89 year old female with past medical history significant for HFpEF, hypertension, COPD, hypothyroidism, chronic kidney disease stage III and type 2 diabetes mellitus who presented with worsening shortness of breath and edema.   She's been diagnosed with influenza, MSSA pneumonia, and MSSA bacteremia.    She's been seen by ID.  Cardiology was c/s for SVT.    She's declined over the past few days.  Now requiring 8 L [place].  CT with multifocal pneumonia.    Goals of care with family ongoing.  Assessment & Plan:   Principal Problem:   Sepsis due to pneumonia [facility]) Active Problems:   Acute hypoxic respiratory failure (HCC)   Influenza Hannah [name] with pneumonia   Chronic obstructive asthma (HCC)   Hypertension   Insulin  dependent type 2 diabetes mellitus (HCC)   CKD stage 3a, GFR 45-59 ml/min (HCC)   Iron  deficiency anemia   UTI (urinary tract infection)   Hypothyroidism   Protein-calorie malnutrition, severe  Goals of Care Hannah [name] present at bedside.  She'll discuss with her sisters and let us  know when we can discuss again together, but discussed her overall picture.  She is continuing to decline.  Discussed transition to hospice - options of inpatient hospice.  Will revisit with family when she let's know convenient time.   6:24 PM Met with family (3 daughters) around noon.  They planned to visit [facility] and discuss hospice at home. Revisited this evening.  They're planning for hospice at home.  Need [name] few days to get things together.  Discussed stopping meds/interventions not focused on comfort (insulin , IVF, abx, monitors, etc).  They're in agreement.    Acute hypoxic respiratory failure Influenza Hannah [name] with MSSA pneumonia MSSA bacteremia Sepsis Repeat CT PE protocol 1/10 with  multifocal pneumonia - tree in bud nodular opacities bilaterally, increased from prior exam.  Patchy airspace disease in mid to lower lung filed on L and RLL.  1/1 blood culture with staph aureus and abiotrophia defectiva (suspected contaminant, but follow follow up culture) 1/3 blood culture NGTDx5 Appreciate ID recommendations - continue ancef  while inpatient with goal of 7 days of IV abx and then transition to linezolid  600 mg PO BID with total of 4 weeks of treatment through 06/16/2024. Tamiflu  1/2-1/6 TTE without notable valvular abnormalities - no plan for TEE leukocytosis partially in setting of steroid use, but fever 1/10 and worsening leukocytosis 1/9.   She met criteria for sepsis 1/11 she was transitioned to cefepime  to cover for aspiration pneumonia/HAP.  Holding course with goc conversations.  Acute Metabolic Encephalopathy Dementia VBG without hypercarbia B12, ammonia, folate all normal Suspect delirium - waxing and waning recently Minimize deliriogenic meds Delirium precautions  Anemia Suspected Hematoma posterior to Sacrum Drop from 9 -> 7.0 on 1/10  S/p 1 unit pRBC - hb is 8.3 today, more consistent with what I would expect after 1 unit pRBC Hyperdense collection posterior to sacrum in subcutaneous soft tissues measuring 4.5 x 1.6 x 1.2 cm Normal b12, folate.  Elevated ferritin, low iron . Heparin  currently on hold  Left Lower Extremity DVT On 1/8 US  Heparin  gtt on hold with drop in hb and suspected hematoma noted above  Intermittent SVT: Short intermittent runs increased metop to 37.5 mg BID - will monitor Appreciate  cardiology assistance - they've signed off 1/7 (see note).  Replace lytes to maintain K>4 and mag >2.  AKI  Hyperkalemia Received lasix  1/10.  Also pRBC.  Sepsis above as well. Improved today with IVF, trend  Hypertension BP on low side Hold amlodipine  Metop to continue with holding parameters  Concern for Poor UOP Urinary Retention Now s/p  foley placement 1/9 800 cc out yesterday Oxybutynin  on hold 1/9 Consider trial of void in Sanvika Cuttino few days  Possible UTI: No urine culture collected CT with bladder wall thickening, perivesicular fat stranding - infectious/inflammatory UA not suggestive of UTI - negative nitrites, negative leukocytes, 0-5 RBC, 6-10 WBC, no bacteria UA at presentation with +LE/nitrites - watch for UTI symptoms She's on cefepime  which should cover most typical causes of urine infection  Concern for Possible Dysphagia Thrush SLP eval, appreciate assistance fluconazole  Patient has history of oral cancer.  HFpEF Echo 05/18/2024 revealed normal left ventricular ejection fraction, with grade 1 diastolic dysfunction. Appears euvolemic to dry Cardiology now signed of 1/7.   Will diurese as needed   AKI Aki from presentation has improved  Left 2nd Toe Injury Plain films unremarkable Supportive care  Dementia: Delirium precautions Continue Aricept .   T2DM Steroid induced hyperglycemia Basal, bolus, SSI - adjust as needed Steroids have been d/c'd Monitor  Loose watery diarrhea: + c diff antigen, negative PCR.  C diff ruled out with ID assistance, appreciate assistance - suspected abx associated diarrhea   Hypokalemia Hypomagnesemia Hypophosphatemia Follow and replace as needed   Poor PO intake Severe Protein Calorie Malnutrition RD Hold megace  - should probably be discontinued indefinitely based on VTE above Body mass index is 17.22 kg/m.  Bilateral LE Pain Described as cramping, unclear cause on 1/9 Seems resolved today palpable bilateral pulses.  No notable masses or visible abnormalities.  Compartments soft.   Heat Rash Suspect this is rash on her bottom Will monitor, supportive care  Severe Protein Calorie Malnutrition Body mass index is 17.22 kg/m.  Concern regarding frequent blood draws.  S/p PICC placement 1/8    DVT prophylaxis: heparin  Code Status: full Family  Communication: Hannah [name] at bedside 1/13. Disposition:   Status is: Inpatient Remains inpatient appropriate because: need for continued inpatient care   Consultants:  Palliative care cardiology  Procedures:  Echo IMPRESSIONS     1. Left ventricular ejection fraction, by estimation, is 60 to 65%. The  left ventricle has normal function. The left ventricle has no regional  wall motion abnormalities. Left ventricular diastolic parameters are  consistent with Grade I diastolic  dysfunction (impaired relaxation).   2. Right ventricular systolic function is normal. The right ventricular  size is normal.   3. The mitral valve is normal in structure. Mild mitral valve  regurgitation. No evidence of mitral stenosis.   4. The aortic valve is tricuspid. Aortic valve regurgitation is not  visualized. No aortic stenosis is present.   5. The inferior vena cava is normal in size with greater than 50%  respiratory variability, suggesting right atrial pressure of 3 mmHg.   Antimicrobials:  Anti-infectives (From admission, onward)    Start     Dose/Rate Route Frequency Ordered Stop   05/28/24 0900  ceFEPIme  (MAXIPIME ) 2 g in sodium chloride  0.9 % 100 mL IVPB        2 g 200 mL/hr over 30 Minutes Intravenous Every 24 hours 05/27/24 1503     05/26/24 2100  ceFEPIme  (MAXIPIME ) 2 g in sodium chloride  0.9 % 100 mL IVPB  Status:  Discontinued        2 g 200 mL/hr over 30 Minutes Intravenous Every 12 hours 05/26/24 1943 05/27/24 1503   05/26/24 0915  cefTRIAXone  (ROCEPHIN ) 2 g in sodium chloride  0.9 % 100 mL IVPB  Status:  Discontinued        2 g 200 mL/hr over 30 Minutes Intravenous Every 24 hours 05/26/24 0822 05/26/24 1943   05/25/24 1000  fluconazole  (DIFLUCAN ) tablet 50 mg       Placed in Followed by Linked Group   50 mg Oral Daily 05/24/24 1049 06/03/24 0959   05/24/24 1145  fluconazole  (DIFLUCAN ) tablet 100 mg       Placed in Followed by Linked Group   100 mg Oral  Once 05/24/24 1049  05/24/24 1234   05/24/24 0000  linezolid  (ZYVOX ) 600 MG tablet        600 mg Oral 2 times daily 05/22/24 1502 06/14/24 2359   05/21/24 1230  fluconazole  (DIFLUCAN ) tablet 50 mg  Status:  Discontinued        50 mg Oral Daily 05/21/24 1140 05/21/24 1432   05/18/24 1829  oseltamivir  (TAMIFLU ) capsule 30 mg        30 mg Oral Daily 05/18/24 1829 05/22/24 0941   05/18/24 1545  ceFAZolin  (ANCEF ) IVPB 2g/100 mL premix  Status:  Discontinued        2 g 200 mL/hr over 30 Minutes Intravenous Every 12 hours 05/18/24 1451 05/26/24 0822   05/18/24 1415  azithromycin  (ZITHROMAX ) 500 mg in sodium chloride  0.9 % 250 mL IVPB  Status:  Discontinued        500 mg 250 mL/hr over 60 Minutes Intravenous  Once 05/18/24 1326 05/18/24 1332   05/18/24 1415  cefTRIAXone  (ROCEPHIN ) 1 g in sodium chloride  0.9 % 100 mL IVPB  Status:  Discontinued        1 g 200 mL/hr over 30 Minutes Intravenous Every 24 hours 05/18/24 1326 05/18/24 1334   05/17/24 2200  oseltamivir  (TAMIFLU ) capsule 30 mg  Status:  Discontinued        30 mg Oral 2 times daily 05/17/24 2025 05/18/24 1829   05/17/24 2100  ceFEPIme  (MAXIPIME ) 2 g in sodium chloride  0.9 % 100 mL IVPB  Status:  Discontinued        2 g 200 mL/hr over 30 Minutes Intravenous Every 24 hours 05/17/24 2057 05/18/24 1451   05/17/24 2100  vancomycin  (VANCOCIN ) IVPB 1000 mg/200 mL premix        1,000 mg 200 mL/hr over 60 Minutes Intravenous  Once 05/17/24 2057 05/17/24 2324   05/17/24 2056  vancomycin  variable dose per unstable renal function (pharmacist dosing)  Status:  Discontinued         Does not apply See admin instructions 05/17/24 2057 05/18/24 1451   05/17/24 1900  cefTRIAXone  (ROCEPHIN ) 1 g in sodium chloride  0.9 % 100 mL IVPB        1 g 200 mL/hr over 30 Minutes Intravenous  Once 05/17/24 1849 05/17/24 1940   05/17/24 1900  azithromycin  (ZITHROMAX ) 500 mg in sodium chloride  0.9 % 250 mL IVPB        500 mg 250 mL/hr over 60 Minutes Intravenous  Once 05/17/24 1849  05/17/24 2115       Subjective:  Received ativan  x1 overnight Discussed GOC  Objective: Vitals:   05/29/24 0216 05/29/24 0500 05/29/24 0609 05/29/24 0811  BP:   (!) 158/81 (!) 148/79  Pulse: 81  94 95  Resp:  12  12 15   Temp:   99.4 F (37.4 C) (!) 96.8 F (36 C)  TempSrc:   Axillary Oral  SpO2: 94%  96% 96%  Weight:  40 kg    Height:        Intake/Output Summary (Last 24 hours) at 05/29/2024 0955 Last data filed at 05/28/2024 2000 Gross per 24 hour  Intake 10 ml  Output 550 ml  Net -540 ml   Filed Weights   05/26/24 0346 05/27/24 0333 05/29/24 0500  Weight: 40.9 kg 41 kg 40 kg    Examination:  Limited exam due to focus on comfort as we see how she does on abx  General: ill appearing - frail - comfortable Cardiovascular: sinus on tele Lungs: audible upper airway sounds from bedside Neurological: asleep, awakens briefly at end of our visit Extremities: No clubbing or cyanosis. No edema.  Data Reviewed: I have personally reviewed following labs and imaging studies  CBC: Recent Labs  Lab 05/24/24 0853 05/25/24 0245 05/26/24 0210 05/26/24 0917 05/26/24 1605 05/27/24 0325 05/28/24 0637 05/29/24 0616  WBC 27.3* 20.8* 23.7*  --   --  23.9* 17.0* 18.4*  NEUTROABS 19.0*  --   --   --   --   --   --  13.8*  HGB 10.4* 9.0* 7.4* 7.6* 7.0* 10.9* 8.3* 8.6*  HCT 31.3* 28.3* 22.9* 22.8* 21.8* 31.7* 25.2* 25.8*  MCV 88.2 92.2 89.8  --   --  86.6 88.7 89.9  PLT 340 290 240  --   --  217 170 176    Basic Metabolic Panel: Recent Labs  Lab 05/25/24 0245 05/26/24 0210 05/27/24 0325 05/27/24 1514 05/28/24 0637 05/29/24 0616  NA 137 133* 135 137 139 140  K 4.8 5.0 5.6* 5.3* 4.9 4.7  CL 104 100 100 105 107 109  CO2 28 28 26 24 24 23   GLUCOSE 80 123* 109* 319* 228* 172*  BUN 20 19 24* 30* 30* 20  CREATININE 0.88 0.78 1.29* 1.48* 1.25* 0.90  CALCIUM  8.3* 8.2* 8.5* 8.0* 8.3* 8.6*  MG 2.1 1.9 2.0  --  2.5* 2.2  PHOS 1.3* 2.2* 3.1  --  4.2 2.8     GFR: Estimated Creatinine Clearance: 26.8 mL/min (by C-G formula based on SCr of 0.9 mg/dL).  Liver Function Tests: Recent Labs  Lab 05/24/24 0853 05/25/24 0245 05/26/24 0210 05/27/24 0325 05/29/24 0616  AST 45* 34 33 43* 37  ALT 18 15 10 7 12   ALKPHOS 46 52 189* 53 75  BILITOT 0.3 0.3 0.3 0.5 0.4  PROT 5.4* 4.8* 4.5* 5.2* 5.5*  ALBUMIN 3.2* 2.7* 2.5* 2.5* 2.4*    CBG: Recent Labs  Lab 05/28/24 1127 05/28/24 1536 05/28/24 2109 05/29/24 0636 05/29/24 0809  GLUCAP 202* 127* 83 157* 171*     Recent Results (from the past 240 hours)  C Difficile Quick Screen w PCR reflex     Status: Abnormal   Collection Time: 05/19/24  8:29 PM   Specimen: STOOL  Result Value Ref Range Status   C Diff antigen POSITIVE (Ara Mano) NEGATIVE Final   C Diff toxin NEGATIVE NEGATIVE Final   C Diff interpretation Results are indeterminate. See PCR results.  Final    Comment: Performed at [facility], [address], [place], [place] [address]  C. Diff by PCR, Reflexed     Status: None   Collection Time: 05/19/24  8:29 PM  Result Value Ref Range Status   Toxigenic C. Difficile by  PCR NEGATIVE NEGATIVE Final    Comment: Patient is colonized with non toxigenic C. difficile. May not need treatment unless significant symptoms are present.   Hypervirulent Strain PRESUMPTIVE NEGATIVE PRESUMPTIVE NEGATIVE Final    Comment: Performed at [facility], [address] [place], [place] [address]  Gastrointestinal Panel by PCR , Stool     Status: None   Collection Time: 05/20/24 12:10 AM   Specimen: Stool  Result Value Ref Range Status   Campylobacter species NOT DETECTED NOT DETECTED Final   Plesimonas shigelloides NOT DETECTED NOT DETECTED Final   Salmonella species NOT DETECTED NOT DETECTED Final   Yersinia enterocolitica NOT DETECTED NOT DETECTED Final   Vibrio species NOT DETECTED NOT DETECTED Final   Vibrio cholerae NOT DETECTED NOT DETECTED Final   Enteroaggregative E coli (EAEC)  NOT DETECTED NOT DETECTED Final   Enteropathogenic E coli (EPEC) NOT DETECTED NOT DETECTED Final   Enterotoxigenic E coli (ETEC) NOT DETECTED NOT DETECTED Final   Shiga like toxin producing E coli (STEC) NOT DETECTED NOT DETECTED Final   Shigella/Enteroinvasive E coli (EIEC) NOT DETECTED NOT DETECTED Final   Cryptosporidium NOT DETECTED NOT DETECTED Final   Cyclospora cayetanensis NOT DETECTED NOT DETECTED Final   Entamoeba histolytica NOT DETECTED NOT DETECTED Final   Giardia lamblia NOT DETECTED NOT DETECTED Final   Adenovirus F40/41 NOT DETECTED NOT DETECTED Final   Astrovirus NOT DETECTED NOT DETECTED Final   Norovirus GI/GII NOT DETECTED NOT DETECTED Final   Rotavirus Bryahna Lesko NOT DETECTED NOT DETECTED Final   Sapovirus (I, II, IV, and V) NOT DETECTED NOT DETECTED Final    Comment: Performed at [facility], [address] [place], [place] [address]  Culture, MAINE Urine     Status: None   Collection Time: 05/25/24  1:19 AM   Specimen: Urine, Random  Result Value Ref Range Status   Specimen Description URINE, RANDOM  Final   Special Requests NONE  Final   Culture   Final    NO GROWTH NO GROUP B STREP (S.AGALACTIAE) ISOLATED Performed at [facility], [address] [place], [place] [address]    Report Status 05/26/2024 FINAL  Final  MRSA Next Gen by PCR, Nasal     Status: None   Collection Time: 05/26/24  8:00 PM   Specimen: Nasal Mucosa; Nasal Swab  Result Value Ref Range Status   MRSA by PCR Next Gen NOT DETECTED NOT DETECTED Final    Comment: (NOTE) The GeneXpert MRSA Assay (FDA approved for NASAL specimens only), is one component of Montavious Wierzba comprehensive MRSA colonization surveillance program. It is not intended to diagnose MRSA infection nor to guide or monitor treatment for MRSA infections. Test performance is not FDA approved in patients less than 88 years old. Performed at [facility], [address] [place], [place] [address]          Radiology  Studies: No results found.       Scheduled Meds:  budesonide   0.5 mg Nebulization BID   Chlorhexidine  Gluconate Cloth  6 each Topical Daily   chlorpheniramine-HYDROcodone   5 mL Oral Q12H   donepezil   5 mg Oral QHS   feeding supplement  237 mL Oral BID BM   fluconazole   50 mg Oral Daily   Gerhardt's butt cream   Topical BID   guaiFENesin   10 mL Oral Q8H   insulin  aspart  0-20 Units Subcutaneous TID WC   insulin  aspart  0-5 Units Subcutaneous QHS  insulin  aspart  5 Units Subcutaneous TID WC   insulin  glargine-yfgn  20 Units Subcutaneous QHS   ipratropium-albuterol   3 mL Nebulization Q6H   lactobacillus  1 g Oral TID WC   latanoprost   1 drop Both Eyes QHS   levothyroxine   50 mcg Oral See admin instructions   metoprolol  succinate  37.5 mg Oral Q12H   pantoprazole   40 mg Oral Daily   polyethylene glycol  17 g Oral BID   sodium chloride  flush  3 mL Intravenous Q12H   sodium chloride  flush  3 mL Intravenous Q12H   Continuous Infusions:  ceFEPime  (MAXIPIME ) IV 2 g (05/28/24 1013)   dextrose  5 % and 0.9 % NaCl 50 mL/hr at 05/29/24 9367     LOS: 11 days    Time spent: over 30 min    Meliton Monte, MD [facility]   To contact the attending provider between 7A-7P or the covering provider during after hours 7P-7A, please log into the web site [email or web address] and access using universal [place] password for that web site. If you do not have the password, please call the hospital operator.  05/29/2024, 9:55 AM    "

## 2024-05-30 ENCOUNTER — Other Ambulatory Visit (HOSPITAL_COMMUNITY): Payer: Self-pay

## 2024-05-30 DIAGNOSIS — J09X1 Influenza due to identified novel influenza A virus with pneumonia: Secondary | ICD-10-CM | POA: Diagnosis not present

## 2024-05-30 DIAGNOSIS — J189 Pneumonia, unspecified organism: Secondary | ICD-10-CM | POA: Diagnosis not present

## 2024-05-30 DIAGNOSIS — Z515 Encounter for palliative care: Secondary | ICD-10-CM | POA: Diagnosis not present

## 2024-05-30 DIAGNOSIS — Z66 Do not resuscitate: Secondary | ICD-10-CM | POA: Diagnosis not present

## 2024-05-30 DIAGNOSIS — A419 Sepsis, unspecified organism: Secondary | ICD-10-CM | POA: Diagnosis not present

## 2024-05-30 DIAGNOSIS — J9601 Acute respiratory failure with hypoxia: Secondary | ICD-10-CM | POA: Diagnosis not present

## 2024-05-30 DIAGNOSIS — Z7189 Other specified counseling: Secondary | ICD-10-CM

## 2024-05-30 LAB — GLUCOSE, CAPILLARY: Glucose-Capillary: 213 mg/dL — ABNORMAL HIGH (ref 70–99)

## 2024-05-30 MED ORDER — BUDESONIDE 0.5 MG/2ML IN SUSP
0.5000 mg | Freq: Two times a day (BID) | RESPIRATORY_TRACT | Status: DC
  Administered 2024-05-30 – 2024-06-01 (×4): 0.5 mg via RESPIRATORY_TRACT
  Filled 2024-05-30 (×4): qty 2

## 2024-05-30 MED ORDER — IPRATROPIUM-ALBUTEROL 0.5-2.5 (3) MG/3ML IN SOLN
3.0000 mL | Freq: Two times a day (BID) | RESPIRATORY_TRACT | Status: DC
  Administered 2024-05-30 – 2024-06-01 (×5): 3 mL via RESPIRATORY_TRACT
  Filled 2024-05-30 (×5): qty 3

## 2024-05-30 NOTE — Progress Notes (Signed)
 Pt's nebulizer txs discontinued at this time.  Pt on Comfort Care.

## 2024-05-30 NOTE — Progress Notes (Signed)
" ° °  Palliative Medicine Inpatient Follow Up Note HPI: Hannah [name] is an 89 year old female with a past medical history significant for heart failure with preserved ejection fraction, hypertension, COPD, hypothyroidism, stage III chronic kidney disease, and type 2 diabetes mellitus.  She has had a prolonged hospitalization in the setting of influenza A, diastolic heart failure, and sepsis due to MSSA bacteremia/pneumonia.  Palliative care is involved for ongoing goals of care conversations.  Today's Discussion 05/30/2024  I reviewed the chart notes including progress notes from Dr. [name]. I also reviewed vital signs - On RA this morning, nursing flowsheets, no PO documented, medication administrations record - antibiotics and mIVF stopped last night; has not needed any ativan  or dilaudid  in > 24 hours. No recent labs or imaging to review.  I met with patients three daughters and granddaughter at bedside this morning. We discussed patient current condition. Family notes that she has expressed thirst and they have gotten some spoonfuls of liquids in her. They note she had not needed any ativan  overnight.  We discussed that patients children would like to take her home with [facility] hospice though it will likely take a few days to set the home up. They asked what [facility] would not provide. I shared that I would reach out directly to [facility] to try to provide clarity on this for them.  Patient family are at peace with their decision(s). We reviewed how hard it is and that patients condition will vary moving into the future in relation to level of alertness. I provided the [facility] booklet for family to review as this process is not one they have been through before.   Questions and concerns addressed/Palliative Support Provided.   Objective Assessment: Vital Signs Vitals:   05/29/24 1456 05/29/24 2115  BP:    Pulse: 97   Resp: 16   Temp:    SpO2: 100% 98%     Intake/Output Summary (Last 24 hours) at 05/30/2024 1115 Last data filed at 05/29/2024 1819 Gross per 24 hour  Intake 2579.37 ml  Output --  Net 2579.37 ml   Last Weight  Most recent update: 05/29/2024  5:40 AM    Weight  40 kg (88 lb 2.9 oz)            Gen: Elderly ill-appearing Indian female HEENT: Oral thrush, dry mucous membranes CV: Regular rate and rhythm PULM: On RA, breathing even & nonlabored ABD: soft/nontender  EXT:   (+) pedal edema, (+) hand Neuro: Sleeping peacefully  SUMMARY OF RECOMMENDATIONS   DNAR/DNI  Comfort Care focus  Plan to transition home one family is able to arrange everything  Appreciate [facility] liaison, Amy speaking to family about what home equipment/items they need to provide on their own (linens)  Emotional support provided  Ongoing PMT support ______________________________________________________________________________________ Rosaline Becton Tennyson Palliative Medicine Team Team Cell Phone: [phone] Please utilize secure chat with additional questions, if there is no response within 30 minutes please call the above phone number  I personally spent a total of 45 minutes in the care of the patient today including preparing to see the patient, performing a medically appropriate exam/evaluation, counseling and educating, referring and communicating with other health care professionals, documenting clinical information in the EHR, and coordinating care.      "

## 2024-05-30 NOTE — Progress Notes (Signed)
 " Progress Note    Hannah [name]   FMW:[id number]  DOB: July 04, 1932  DOA: 05/17/2024     12 PCP: Sun, Vyvyan, MD  Initial CC: Shortness of breath  Hospital Course: Hannah [name] is an 89 year old female with past medical history significant for HFpEF, hypertension, COPD, hypothyroidism, chronic kidney disease stage III and type 2 diabetes mellitus who presented with worsening shortness of breath and edema.    She's been diagnosed with influenza, MSSA pneumonia, and MSSA bacteremia.     She's been seen by ID.  Cardiology was c/s for SVT.     She's declined over the past few days. CT with multifocal pneumonia.    Assessment & Plan:   Goals of Care - [name] and family have had good conversations at length especially with Dr. [name] previously.  Plan is now for transitioning to comfort care and pursuing discharge home with hospice in place   Acute hypoxic respiratory failure Influenza A with MSSA pneumonia MSSA bacteremia Sepsis Repeat CT PE protocol 1/10 with multifocal pneumonia - tree in bud nodular opacities bilaterally, increased from prior exam.  Patchy airspace disease in mid to lower lung filed on L and RLL.  1/1 blood culture with staph aureus and abiotrophia defectiva (suspected contaminant, but follow follow up culture) 1/3 blood culture NGTDx5 Appreciate ID recommendations - continue ancef  while inpatient with goal of 7 days of IV abx and then transition to linezolid  600 mg PO BID with total of 4 weeks of treatment through 06/16/2024. Tamiflu  1/2-1/6 TTE without notable valvular abnormalities - no plan for TEE leukocytosis partially in setting of steroid use, but fever 1/10 and worsening leukocytosis 1/9.   She met criteria for sepsis 1/11 she was transitioned to cefepime  to cover for aspiration pneumonia/HAP.  Holding course with goc conversations. - now s/p GOC discussions, no further abx and focus will be comfort care   Acute Metabolic Encephalopathy Dementia VBG without  hypercarbia B12, ammonia, folate all normal Suspect delirium - waxing and waning recently Minimize deliriogenic meds Delirium precautions   Anemia Suspected Hematoma posterior to Sacrum Drop from 9 -> 7.0 on 1/10  S/p 1 unit pRBC - hb is 8.3 today, more consistent with what I would expect after 1 unit pRBC Hyperdense collection posterior to sacrum in subcutaneous soft tissues measuring 4.5 x 1.6 x 1.2 cm Normal b12, folate.  Elevated ferritin, low iron . Heparin  currently on hold   Left Lower Extremity DVT On 1/8 US  Heparin  gtt on hold with drop in hb and suspected hematoma noted above, plus now focus is comfort/hospice    Intermittent SVT Short intermittent runs increased metop to 37.5 mg BID - will monitor Appreciate cardiology assistance - they've signed off 1/7 (see note)   AKI  Hyperkalemia Received lasix  1/10.  Also pRBC.  Sepsis above as well Improved today with IVF   Hypertension BP on low side Hold amlodipine    Concern for Poor UOP Urinary Retention Now s/p foley placement 1/9 - continue foley for comfort    Possible UTI No urine culture collected CT with bladder wall thickening, perivesicular fat stranding - infectious/inflammatory UA not suggestive of UTI - negative nitrites, negative leukocytes, 0-5 RBC, 6-10 WBC, no bacteria UA at presentation with +LE/nitrites - watch for UTI symptoms - s/p abx    Concern for Possible Dysphagia Thrush SLP eval, appreciate assistance - continue pleasure feeding    HFpEF Echo 05/18/2024 revealed normal left ventricular ejection fraction, with grade 1 diastolic dysfunction Appears euvolemic to dry  Cardiology now signed of 1/7   AKI Aki from presentation has improved   Left 2nd Toe Injury Plain films unremarkable Supportive care   Dementia Delirium precautions   T2DM Steroid induced hyperglycemia Steroids have been d/c'd   Loose watery diarrhea: + c diff antigen, negative PCR.  C diff ruled out with ID  assistance, appreciate assistance - suspected abx associated diarrhea   Hypokalemia Hypomagnesemia Hypophosphatemia - repleted    Poor PO intake Severe Protein Calorie Malnutrition RD Hold megace   Body mass index is 17.22 kg/m. -Continue comfort care   Bilateral LE Pain Described as cramping, unclear cause on 1/9 Seems resolved today palpable bilateral pulses.  No notable masses or visible abnormalities.  Compartments soft.    Heat Rash Suspect this is rash on her bottom Will monitor, supportive care   Severe Protein Calorie Malnutrition Body mass index is 17.22 kg/m.   Concern regarding frequent blood draws.  S/p PICC placement 1/8  Interval History:   No events overnight.  Family present bedside this morning.  GOC are clear at this point. Patient is resting comfortably in bed. She is in no distress.  No pain today.  Needed 1 dose of Ativan  yesterday early in the morning and none since.   Consultants:  Palliative care  Procedures:    DVT prophylaxis:     Code Status:   Code Status: Do not attempt resuscitation (DNR) - Comfort care  Barriers to discharge: None Therapy evaluation: PT Orders:   PT Follow up Rec: Home Health Pt1/01/2025 1649  Disposition Plan: Home with hospice Status is: Inpatient  Mobility Assessment (Last 72 Hours)     Mobility Assessment     Row Name 05/30/24 1100 05/29/24 2130 05/29/24 1000 05/28/24 2000 05/28/24 0821   Does the patient have exclusion criteria? No- Perform mobility assessment No- Perform mobility assessment No- Perform mobility assessment No- Perform mobility assessment No- Perform mobility assessment   What is the highest level of mobility based on the mobility assessment? Level 1 (Bedfast) - Unable to balance while sitting on edge of bed Level 1 (Bedfast) - Unable to balance while sitting on edge of bed Level 1 (Bedfast) - Unable to balance while sitting on edge of bed Level 1 (Bedfast) - Unable to balance while  sitting on edge of bed Level 1 (Bedfast) - Unable to balance while sitting on edge of bed   Is the above level different from baseline mobility prior to current illness? Yes - Recommend PT order No - Consider discontinuing PT/OT Yes - Recommend PT order Yes - Recommend PT order Yes - Recommend PT order    Row Name 05/27/24 2005           Does the patient have exclusion criteria? No- Perform mobility assessment       What is the highest level of mobility based on the mobility assessment? Level 1 (Bedfast) - Unable to balance while sitting on edge of bed       Is the above level different from baseline mobility prior to current illness? Yes - Recommend PT order          Diet: Diet Orders (From admission, onward)     Start     Ordered   05/29/24 1051  Diet NPO time specified Except for: Other (See Comments), Sips with Meds  Diet effective now       Comments: As tolerated for comfort  Question Answer Comment  Except for Other (See Comments)   Except for  Sips with Meds      05/29/24 1050            Objective: Blood pressure (!) 153/84, pulse 97, temperature (!) 96.5 F (35.8 C), temperature source Axillary, resp. rate 16, height 5' (1.524 m), weight 40 kg, SpO2 98%.  Examination:  Physical Exam Constitutional:      Appearance: Normal appearance.     Comments: Pleasant elderly woman resting in bed in no distress  HENT:     Head: Normocephalic and atraumatic.     Mouth/Throat:     Mouth: Mucous membranes are moist.  Eyes:     Extraocular Movements: Extraocular movements intact.  Cardiovascular:     Rate and Rhythm: Normal rate and regular rhythm.  Pulmonary:     Effort: Pulmonary effort is normal. No respiratory distress.     Breath sounds: Rhonchi present. No wheezing.  Abdominal:     General: Bowel sounds are normal. There is no distension.     Palpations: Abdomen is soft.     Tenderness: There is no abdominal tenderness.  Musculoskeletal:        General: Normal  range of motion.     Cervical back: Normal range of motion and neck supple.  Skin:    General: Skin is warm and dry.  Neurological:     Comments: Exam deferred given comfort care      Data Reviewed: No results found for this or any previous visit (from the past 24 hours).  I have reviewed pertinent nursing notes, vitals, labs, and images as necessary. I have ordered labwork to follow up on as indicated.  I have reviewed the last notes from staff over past 24 hours. I have discussed patient's care plan and test results with nursing staff, CM/SW, and other staff as appropriate.  Old records reviewed in assessment of this patient  Time spent: Greater than 50% of the 55 minute visit was spent in counseling/coordination of care for the patient as laid out in the A&P.   LOS: 12 days   Alm Apo, MD [facility] 05/30/2024, 4:46 PM "

## 2024-05-31 ENCOUNTER — Other Ambulatory Visit (HOSPITAL_COMMUNITY): Payer: Self-pay

## 2024-05-31 DIAGNOSIS — J189 Pneumonia, unspecified organism: Secondary | ICD-10-CM | POA: Diagnosis not present

## 2024-05-31 DIAGNOSIS — Z7189 Other specified counseling: Secondary | ICD-10-CM | POA: Diagnosis not present

## 2024-05-31 DIAGNOSIS — J09X1 Influenza due to identified novel influenza A virus with pneumonia: Secondary | ICD-10-CM | POA: Diagnosis not present

## 2024-05-31 DIAGNOSIS — Z515 Encounter for palliative care: Secondary | ICD-10-CM | POA: Diagnosis not present

## 2024-05-31 NOTE — TOC Initial Note (Signed)
 Transition of Care (TOC) - Initial/Assessment Note  Rayfield Gobble RN, BSN Inpatient Care Management Unit 4E- RN Case Manager See Treatment Team for direct phone #   Patient Details  Name: Hannah [name] MRN: [id number] Date of Birth: 1932/09/06  Transition of Care [place] [facility]) CM/SW Contact:    Gobble Rayfield Hurst, RN Phone Number: 05/31/2024, 11:27 AM  Clinical Narrative:                 Pt from home with daughters, plan to return home w/ Hospice. Family has toured [employer] and decided to take pt home w/ hospice - [facility] following for home hospice needs and DME.   Family is arranging things at home in order to have DME delivered. Per MD family anticipate they will be ready for DME on 1/16.  [facility] will follow up with family and plan to order DME once they are ready.  Once DME in the home- plan to transition pt home w/ hospice.   Pt will need GOLD DNR for transport.   ICM will continue to follow for coordination of discharge.   Expected Discharge Plan: Home w Hospice Care Barriers to Discharge: Family Issues, Continued Medical Work up, Other (must enter comment) (pending DME delivery w/ Hospice and family getting home ready)   Patient Goals and CMS Choice Patient states their goals for this hospitalization and ongoing recovery are:: return home w/ hospce- comfort care   Choice offered to / list presented to : Adult Children      Expected Discharge Plan and Services   Discharge Planning Services: CM Consult Post Acute Care Choice: Hospice Living arrangements for the past 2 months: Single Family Home                 DME Arranged: Hospice Equipment Package Others DME Agency: Other - Comment Date DME Agency Contacted: 05/31/24   Representative spoke with at DME Agency: Amy with [facility]- to arrange and order DME for home [facility] Arranged: Disease Management HH Agency: [facility] Date [facility] Agency Contacted: 05/29/24   Representative  spoke with at [facility] Agency: [facility]  Prior Living Arrangements/Services Living arrangements for the past 2 months: Single Family Home Lives with:: Relatives Patient language and need for interpreter reviewed:: Yes Do you feel safe going back to the place where you live?: Yes      Need for Family Participation in Patient Care: Yes (Comment) Care giver support system in place?: Yes (comment)   Criminal Activity/Legal Involvement Pertinent to Current Situation/Hospitalization: No - Comment as needed  Activities of Daily Living   ADL Screening (condition at time of admission) Independently performs ADLs?: No Does the patient have a NEW difficulty with bathing/dressing/toileting/self-feeding that is expected to last >3 days?: Yes (Initiates electronic notice to provider for possible OT consult) Does the patient have a NEW difficulty with getting in/out of bed, walking, or climbing stairs that is expected to last >3 days?: Yes (Initiates electronic notice to provider for possible PT consult) Does the patient have a NEW difficulty with communication that is expected to last >3 days?: No Is the patient deaf or have difficulty hearing?: Yes Does the patient have difficulty seeing, even when wearing glasses/contacts?: No Does the patient have difficulty concentrating, remembering, or making decisions?: Yes  Permission Sought/Granted   Permission granted to share information with : Yes, Verbal Permission Granted     Permission granted to share info w AGENCY: [facility]        Emotional  Assessment         Alcohol  / Substance Use: Not Applicable Psych Involvement: No (comment)  Admission diagnosis:  Influenza A [J10.1] Sepsis due to pneumonia (HCC) [J18.9, A41.9] Pneumonia due to infectious organism, unspecified laterality, unspecified part of lung [J18.9] Sepsis, due to unspecified organism, unspecified whether acute organ dysfunction present (HCC) [A41.9] Acute hypoxic respiratory  failure (HCC) [J96.01] Patient Active Problem List   Diagnosis Date Noted   Goals of care, counseling/discussion 05/30/2024   End of life care 05/30/2024   Protein-calorie malnutrition, severe 05/24/2024   UTI (urinary tract infection) 05/18/2024   Influenza A with pneumonia 05/18/2024   Unilateral vestibular schwannoma (HCC) 02/12/2024   Sensorineural hearing loss, bilateral 02/12/2024   Iron  deficiency anemia 11/30/2023   Asthma, chronic obstructive, with acute exacerbation (HCC) 11/27/2023   CKD stage 3a, GFR 45-59 ml/min (HCC) 11/27/2023   Hypothyroidism    Hypertension    Insulin  dependent type 2 diabetes mellitus (HCC)    Chronic obstructive asthma (HCC) 04/19/2019   Nonrheumatic mitral valve regurgitation 10/30/2018   S/P lumbar laminectomy 04/12/2018   PCP:  Sun, Vyvyan, MD Pharmacy:   [id number] ([address]), [place] - [address] [place] ([place]) [place] [address] Phone: [phone] Fax: [phone]     Social Drivers of Health (SDOH) Social History: SDOH Screenings   Food Insecurity: Patient Declined (05/18/2024)  Housing: Low Risk (11/27/2023)  Transportation Needs: Unknown (05/18/2024)  Utilities: Not At Risk (11/27/2023)  Social Connections: Unknown (11/27/2023)  Tobacco Use: Low Risk (05/17/2024)   SDOH Interventions:     Readmission Risk Interventions    05/23/2024    1:31 PM  Readmission Risk Prevention Plan  Transportation Screening Complete  Medication Review (RN Care Manager) Complete  PCP or Specialist appointment within 3-5 days of discharge Complete  HRI or Home Care Consult Complete  SW Recovery Care/Counseling Consult Complete  Skilled Nursing Facility Not Applicable

## 2024-05-31 NOTE — Progress Notes (Signed)
 Spoke with daughter who stated my mom is comfort care. Was informed by daughter that family would do most care and if she needed me she would notify me. Will respect wishes of daughter and continue to check on her and pt.

## 2024-05-31 NOTE — Progress Notes (Signed)
 " Progress Note    Hannah [name]   FMW:[id number]  DOB: 1933-03-29  DOA: 05/17/2024     13 PCP: Sun, Vyvyan, MD  Initial CC: Shortness of breath  Hospital Course: Ms. Hannah [name] is an 89 year old female with past medical history significant for HFpEF, hypertension, COPD, hypothyroidism, chronic kidney disease stage III and type 2 diabetes mellitus who presented with worsening shortness of breath and edema.    She's been diagnosed with influenza, MSSA pneumonia, and MSSA bacteremia.     She's been seen by ID.  Cardiology was c/s for SVT.     She's declined over the past few days. CT with multifocal pneumonia.    Assessment & Plan:   Goals of Care - [name] and family have had good conversations at length especially with Dr. [name] previously.  Plan is now for transitioning to comfort care and pursuing discharge home with hospice in place   Acute hypoxic respiratory failure Influenza A with MSSA pneumonia MSSA bacteremia Sepsis Repeat CT PE protocol 1/10 with multifocal pneumonia - tree in bud nodular opacities bilaterally, increased from prior exam.  Patchy airspace disease in mid to lower lung filed on L and RLL.  1/1 blood culture with staph aureus and abiotrophia defectiva (suspected contaminant, but follow follow up culture) 1/3 blood culture NGTDx5 Appreciate ID recommendations - continue ancef  while inpatient with goal of 7 days of IV abx and then transition to linezolid  600 mg PO BID with total of 4 weeks of treatment through 06/16/2024. Tamiflu  1/2-1/6 TTE without notable valvular abnormalities - no plan for TEE leukocytosis partially in setting of steroid use, but fever 1/10 and worsening leukocytosis 1/9.   She met criteria for sepsis 1/11 she was transitioned to cefepime  to cover for aspiration pneumonia/HAP.  Holding course with goc conversations. - now s/p GOC discussions, no further abx and focus will be comfort care   Acute Metabolic Encephalopathy Dementia VBG without  hypercarbia B12, ammonia, folate all normal Suspect delirium - waxing and waning recently Minimize deliriogenic meds Delirium precautions   Anemia Suspected Hematoma posterior to Sacrum Drop from 9 -> 7.0 on 1/10  S/p 1 unit pRBC - hb is 8.3 today, more consistent with what I would expect after 1 unit pRBC Hyperdense collection posterior to sacrum in subcutaneous soft tissues measuring 4.5 x 1.6 x 1.2 cm Normal b12, folate.  Elevated ferritin, low iron . Heparin  currently on hold   Left Lower Extremity DVT On 1/8 US  Heparin  gtt on hold with drop in hb and suspected hematoma noted above, plus now focus is comfort/hospice    Intermittent SVT Short intermittent runs increased metop to 37.5 mg BID - will monitor Appreciate cardiology assistance - they've signed off 1/7 (see note)   AKI  Hyperkalemia Received lasix  1/10.  Also pRBC.  Sepsis above as well Improved today with IVF   Hypertension BP on low side Hold amlodipine    Concern for Poor UOP Urinary Retention Now s/p foley placement 1/9 - continue foley for comfort    Possible UTI No urine culture collected CT with bladder wall thickening, perivesicular fat stranding - infectious/inflammatory UA not suggestive of UTI - negative nitrites, negative leukocytes, 0-5 RBC, 6-10 WBC, no bacteria UA at presentation with +LE/nitrites - watch for UTI symptoms - s/p abx    Concern for Possible Dysphagia Thrush SLP eval, appreciate assistance - continue pleasure feeding    HFpEF Echo 05/18/2024 revealed normal left ventricular ejection fraction, with grade 1 diastolic dysfunction Appears euvolemic to dry  Cardiology now signed of 1/7   AKI Aki from presentation has improved   Left 2nd Toe Injury Plain films unremarkable Supportive care   Dementia Delirium precautions   T2DM Steroid induced hyperglycemia Steroids have been d/c'd   Loose watery diarrhea: + c diff antigen, negative PCR.  C diff ruled out with ID  assistance, appreciate assistance - suspected abx associated diarrhea   Hypokalemia Hypomagnesemia Hypophosphatemia - repleted    Poor PO intake Severe Protein Calorie Malnutrition RD Hold megace   Body mass index is 17.22 kg/m. -Continue comfort care   Bilateral LE Pain Described as cramping, unclear cause on 1/9 Seems resolved today palpable bilateral pulses.  No notable masses or visible abnormalities.  Compartments soft.    Heat Rash Suspect this is rash on her bottom Will monitor, supportive care   Severe Protein Calorie Malnutrition Body mass index is 17.22 kg/m.   Concern regarding frequent blood draws.  S/p PICC placement 1/8  Interval History:   No events overnight.  [name] present this morning. They are working on clearing out the house today to the storage facility.  If they accomplish everything today, they would be ready for DME by tomorrow, if not, things will be pushed back a day.   Consultants:  Palliative care  Procedures:    DVT prophylaxis:     Code Status:   Code Status: Do not attempt resuscitation (DNR) - Comfort care  Barriers to discharge: None Therapy evaluation: PT Orders:   PT Follow up Rec: Home Health Pt1/01/2025 1649  Disposition Plan: Home with hospice Status is: Inpatient  Mobility Assessment (Last 72 Hours)     Mobility Assessment     Row Name 05/31/24 0003 05/30/24 1100 05/29/24 2130 05/29/24 1000 05/28/24 2000   Does the patient have exclusion criteria? No- Perform mobility assessment No- Perform mobility assessment No- Perform mobility assessment No- Perform mobility assessment No- Perform mobility assessment   What is the highest level of mobility based on the mobility assessment? Level 1 (Bedfast) - Unable to balance while sitting on edge of bed Level 1 (Bedfast) - Unable to balance while sitting on edge of bed Level 1 (Bedfast) - Unable to balance while sitting on edge of bed Level 1 (Bedfast) - Unable to balance while  sitting on edge of bed Level 1 (Bedfast) - Unable to balance while sitting on edge of bed   Is the above level different from baseline mobility prior to current illness? No - Consider discontinuing PT/OT Yes - Recommend PT order No - Consider discontinuing PT/OT Yes - Recommend PT order Yes - Recommend PT order      Diet: Diet Orders (From admission, onward)     Start     Ordered   05/29/24 1051  Diet NPO time specified Except for: Other (See Comments), Sips with Meds  Diet effective now       Comments: As tolerated for comfort  Question Answer Comment  Except for Other (See Comments)   Except for Sips with Meds      05/29/24 1050            Objective: Blood pressure (!) 153/84, pulse 97, temperature (!) 96.5 F (35.8 C), temperature source Axillary, resp. rate 16, height 5' (1.524 m), weight 40 kg, SpO2 98%.  Examination:  Physical Exam Constitutional:      Appearance: Normal appearance.     Comments: Pleasant elderly woman resting in bed in no distress  HENT:     Head: Normocephalic and  atraumatic.     Mouth/Throat:     Mouth: Mucous membranes are moist.  Eyes:     Extraocular Movements: Extraocular movements intact.  Cardiovascular:     Rate and Rhythm: Normal rate and regular rhythm.  Pulmonary:     Effort: Pulmonary effort is normal. No respiratory distress.     Breath sounds: Rhonchi present. No wheezing.  Abdominal:     General: Bowel sounds are normal. There is no distension.     Palpations: Abdomen is soft.     Tenderness: There is no abdominal tenderness.  Musculoskeletal:        General: Normal range of motion.     Cervical back: Normal range of motion and neck supple.  Skin:    General: Skin is warm and dry.  Neurological:     Comments: Exam deferred given comfort care      Data Reviewed: Results for orders placed or performed during the hospital encounter of 05/17/24 (from the past 24 hours)  Glucose, capillary     Status: Abnormal   Collection  Time: 05/30/24  8:59 PM  Result Value Ref Range   Glucose-Capillary 213 (H) 70 - 99 mg/dL    I have reviewed pertinent nursing notes, vitals, labs, and images as necessary. I have ordered labwork to follow up on as indicated.  I have reviewed the last notes from staff over past 24 hours. I have discussed patient's care plan and test results with nursing staff, CM/SW, and other staff as appropriate.  Old records reviewed in assessment of this patient  Time spent: Greater than 50% of the 55 minute visit was spent in counseling/coordination of care for the patient as laid out in the A&P.   LOS: 13 days   Alm Apo, MD [facility] 05/31/2024, 2:46 PM "

## 2024-06-01 ENCOUNTER — Ambulatory Visit: Admitting: Cardiology

## 2024-06-01 MED ORDER — HYDROCOD POLI-CHLORPHE POLI ER 10-8 MG/5ML PO SUER: 5.0000 mL | Freq: Two times a day (BID) | ORAL | 0 refills | Status: AC | PRN

## 2024-06-01 MED ORDER — SODIUM CHLORIDE 0.9% FLUSH: 10.0000 mL | INTRAVENOUS | Status: AC | PRN

## 2024-06-01 NOTE — TOC Transition Note (Signed)
 Transition of Care (TOC) - Discharge Note Rayfield Gobble RN, BSN Inpatient Care Management Unit 4E- RN Case Manager See Treatment Team for direct phone #   Patient Details  Name: ANSHI JALLOH MRN: [id number] Date of Birth: December 27, 1932  Transition of Care [facility]) CM/SW Contact:  Gobble Rayfield Hurst, RN Phone Number: 06/01/2024, 2:36 PM   Clinical Narrative:    Notified by [facility] this am that DME has been ordered and planned delivery this afternoon by 3pm.   Family agreeable to have pt transition home today, [facility] has scheduled a nursing visit this evening around 5pm.   Pt will transport via [facility].   1425- [facility] called for transport. Per dispatch ETA for pickup is 1.5-2hr.   Pt will keep PICC line initially per family request.   Paperwork for [facility] and GOLD DNR on chart.   IP CM interventions have been completed no further needs noted.   Final next level of care: Home w Hospice Care Barriers to Discharge: Barriers Resolved   Patient Goals and CMS Choice Patient states their goals for this hospitalization and ongoing recovery are:: return home w/ hospce- comfort care   Choice offered to / list presented to : Adult Children      Discharge Placement                 Home w/ Hospice.       Discharge Plan and Services Additional resources added to the After Visit Summary for     Discharge Planning Services: CM Consult Post Acute Care Choice: Hospice          DME Arranged: Hospice Equipment Package Others DME Agency: Other - Comment Date DME Agency Contacted: 05/31/24   Representative spoke with at DME Agency: Amy with [facility]- to arrange and order DME for home [facility] Arranged: Disease Management HH Agency: [facility] Date [facility] Agency Contacted: 05/29/24   Representative spoke with at [facility] Agency: [facility]  Social Drivers of Health (SDOH) Interventions SDOH Screenings   Food Insecurity: Patient Declined (05/18/2024)   Housing: Low Risk (11/27/2023)  Transportation Needs: Unknown (05/18/2024)  Utilities: Not At Risk (11/27/2023)  Social Connections: Unknown (11/27/2023)  Tobacco Use: Low Risk (05/17/2024)     Readmission Risk Interventions    05/23/2024    1:31 PM  Readmission Risk Prevention Plan  Transportation Screening Complete  Medication Review (RN Care Manager) Complete  PCP or Specialist appointment within 3-5 days of discharge Complete  HRI or Home Care Consult Complete  SW Recovery Care/Counseling Consult Complete  Skilled Nursing Facility Not Applicable

## 2024-06-01 NOTE — Progress Notes (Signed)
 " Progress Note    LEVONNE CARRERAS   FMW:[id number]  DOB: [birth year]  DOA: 05/17/2024     14 PCP: Sun, Vyvyan, MD  Initial CC: Shortness of breath  Hospital Course: Ms. Dilger is an 89 year old female with past medical history significant for HFpEF, hypertension, COPD, hypothyroidism, chronic kidney disease stage III and type 2 diabetes mellitus who presented with worsening shortness of breath and edema.    She's been diagnosed with influenza, MSSA pneumonia, and MSSA bacteremia.     She's been seen by ID.  Cardiology was c/s for SVT.     She's declined over the past few days. CT with multifocal pneumonia.    Assessment & Plan:   Goals of Care - [name] and family have had good conversations at length especially with Dr. [name] previously.  Plan is now for transitioning to comfort care and pursuing discharge home with hospice in place   Acute hypoxic respiratory failure Influenza A with MSSA pneumonia MSSA bacteremia Sepsis Repeat CT PE protocol 1/10 with multifocal pneumonia - tree in bud nodular opacities bilaterally, increased from prior exam.  Patchy airspace disease in mid to lower lung filed on L and RLL.  1/1 blood culture with staph aureus and abiotrophia defectiva (suspected contaminant, but follow follow up culture) 1/3 blood culture NGTDx5 Appreciate ID recommendations - continue ancef  while inpatient with goal of 7 days of IV abx and then transition to linezolid  600 mg PO BID with total of 4 weeks of treatment through 06/16/2024. Tamiflu  1/2-1/6 TTE without notable valvular abnormalities - no plan for TEE leukocytosis partially in setting of steroid use, but fever 1/10 and worsening leukocytosis 1/9.   She met criteria for sepsis 1/11 she was transitioned to cefepime  to cover for aspiration pneumonia/HAP.  Holding course with goc conversations. - now s/p GOC discussions, no further abx and focus will be comfort care   Acute Metabolic Encephalopathy Dementia VBG without  hypercarbia B12, ammonia, folate all normal Suspect delirium - waxing and waning recently Minimize deliriogenic meds Delirium precautions   Anemia Suspected Hematoma posterior to Sacrum Drop from 9 -> 7.0 on 1/10  S/p 1 unit pRBC - hb is 8.3 today, more consistent with what I would expect after 1 unit pRBC Hyperdense collection posterior to sacrum in subcutaneous soft tissues measuring 4.5 x 1.6 x 1.2 cm Normal b12, folate.  Elevated ferritin, low iron . Heparin  currently on hold   Left Lower Extremity DVT On 1/8 US  Heparin  gtt on hold with drop in hb and suspected hematoma noted above, plus now focus is comfort/hospice    Intermittent SVT Short intermittent runs increased metop to 37.5 mg BID - will monitor Appreciate cardiology assistance - they've signed off 1/7 (see note)   AKI  Hyperkalemia Received lasix  1/10.  Also pRBC.  Sepsis above as well Improved today with IVF   Hypertension BP on low side Hold amlodipine    Concern for Poor UOP Urinary Retention Now s/p foley placement 1/9 - continue foley for comfort    Possible UTI No urine culture collected CT with bladder wall thickening, perivesicular fat stranding - infectious/inflammatory UA not suggestive of UTI - negative nitrites, negative leukocytes, 0-5 RBC, 6-10 WBC, no bacteria UA at presentation with +LE/nitrites - watch for UTI symptoms - s/p abx    Concern for Possible Dysphagia Thrush SLP eval, appreciate assistance - continue pleasure feeding    HFpEF Echo 05/18/2024 revealed normal left ventricular ejection fraction, with grade 1 diastolic dysfunction Appears euvolemic to dry  Cardiology now signed of 1/7   AKI Aki from presentation has improved   Left 2nd Toe Injury Plain films unremarkable Supportive care   Dementia Delirium precautions   T2DM Steroid induced hyperglycemia Steroids have been d/c'd   Loose watery diarrhea: + c diff antigen, negative PCR.  C diff ruled out with ID  assistance, appreciate assistance - suspected abx associated diarrhea   Hypokalemia Hypomagnesemia Hypophosphatemia - repleted    Poor PO intake Severe Protein Calorie Malnutrition RD Hold megace   Body mass index is 17.22 kg/m. -Continue comfort care   Bilateral LE Pain Described as cramping, unclear cause on 1/9 Seems resolved today palpable bilateral pulses.  No notable masses or visible abnormalities.  Compartments soft.    Heat Rash Suspect this is rash on her bottom Will monitor, supportive care   Severe Protein Calorie Malnutrition Body mass index is 17.22 kg/m.   Concern regarding frequent blood draws.  S/p PICC placement 1/8  Interval History:   No events overnight. They are saying should be done with arranging the home today and plan to reach out to hospice today to get DME delivered.  I'm assuming we can get her home either Saturday or Sunday if equipment arrives.    Consultants:  Palliative care  Procedures:    DVT prophylaxis:     Code Status:   Code Status: Do not attempt resuscitation (DNR) - Comfort care  Barriers to discharge: None Therapy evaluation: PT Orders:   PT Follow up Rec: Home Health Pt1/01/2025 1649  Disposition Plan: Home with hospice Status is: Inpatient  Mobility Assessment (Last 72 Hours)     Mobility Assessment     Row Name 06/01/24 1018 06/01/24 0100 05/31/24 2000 05/31/24 0003 05/30/24 1100   Does the patient have exclusion criteria? Yes- Hold (Level 0) - Assessment complete No- Perform mobility assessment No- Perform mobility assessment No- Perform mobility assessment No- Perform mobility assessment   What is the highest level of mobility based on the mobility assessment? Level 0 (Hold) - At risk for rapid decline or arrest with movement or actively dying Level 1 (Bedfast) - Unable to balance while sitting on edge of bed Level 1 (Bedfast) - Unable to balance while sitting on edge of bed Level 1 (Bedfast) - Unable to  balance while sitting on edge of bed Level 1 (Bedfast) - Unable to balance while sitting on edge of bed   Is the above level different from baseline mobility prior to current illness? No - Consider discontinuing PT/OT No - Consider discontinuing PT/OT  UTA=family refused- comfort care No - Consider discontinuing PT/OT  UTA=family refused- comfort care No - Consider discontinuing PT/OT Yes - Recommend PT order    Row Name 05/29/24 2130           Does the patient have exclusion criteria? No- Perform mobility assessment       What is the highest level of mobility based on the mobility assessment? Level 1 (Bedfast) - Unable to balance while sitting on edge of bed       Is the above level different from baseline mobility prior to current illness? No - Consider discontinuing PT/OT          Diet: Diet Orders (From admission, onward)     Start     Ordered   05/29/24 1051  Diet NPO time specified Except for: Other (See Comments), Sips with Meds  Diet effective now       Comments: As tolerated for comfort  Question Answer Comment  Except for Other (See Comments)   Except for Sips with Meds      05/29/24 1050            Objective: Blood pressure (!) 153/84, pulse 97, temperature (!) 96.5 F (35.8 C), temperature source Axillary, resp. rate 16, height 5' (1.524 m), weight 40 kg, SpO2 98%.  Examination:  Physical Exam Constitutional:      Appearance: Normal appearance.     Comments: Pleasant elderly woman resting in bed in no distress  HENT:     Head: Normocephalic and atraumatic.     Mouth/Throat:     Mouth: Mucous membranes are moist.  Eyes:     Extraocular Movements: Extraocular movements intact.  Cardiovascular:     Rate and Rhythm: Normal rate and regular rhythm.  Pulmonary:     Effort: Pulmonary effort is normal. No respiratory distress.     Breath sounds: Rhonchi present. No wheezing.  Abdominal:     General: Bowel sounds are normal. There is no distension.      Palpations: Abdomen is soft.     Tenderness: There is no abdominal tenderness.  Musculoskeletal:        General: Normal range of motion.     Cervical back: Normal range of motion and neck supple.  Skin:    General: Skin is warm and dry.  Neurological:     Comments: Exam deferred given comfort care      Data Reviewed: No results found for this or any previous visit (from the past 24 hours).   I have reviewed pertinent nursing notes, vitals, labs, and images as necessary. I have ordered labwork to follow up on as indicated.  I have reviewed the last notes from staff over past 24 hours. I have discussed patient's care plan and test results with nursing staff, CM/SW, and other staff as appropriate.  Old records reviewed in assessment of this patient  Time spent: Greater than 50% of the 55 minute visit was spent in counseling/coordination of care for the patient as laid out in the A&P.   LOS: 14 days   Alm Apo, MD [facility] 06/01/2024, 12:18 PM "

## 2024-06-01 NOTE — Discharge Summary (Addendum)
 " Physician Discharge Summary   Hannah [name] FMW:[id number] DOB: February 15, 1933 DOA: 05/17/2024  PCP: Sun, Vyvyan, MD  Admit date: 05/17/2024 Discharge date: 06/01/2024  Admitted From: Home Disposition:  Home with hospice Discharging physician: Alm Apo, MD Barriers to discharge: none  Recommendations at discharge: Continue end of life care with hospice Continue PICC flushes and line care with home hospice    Discharge Condition: fair CODE STATUS: DNR Diet recommendation:  Diet Orders (From admission, onward)     Start     Ordered   05/29/24 1051  Diet NPO time specified Except for: Other (See Comments), Sips with Meds  Diet effective now       Comments: As tolerated for comfort  Question Answer Comment  Except for Other (See Comments)   Except for Sips with Meds      05/29/24 1050            Hospital Course: Hannah [name] is an 89 year old female with past medical history significant for HFpEF, hypertension, COPD, hypothyroidism, chronic kidney disease stage III and type 2 diabetes mellitus who presented with worsening shortness of breath and edema.    She's been diagnosed with influenza, MSSA pneumonia, and MSSA bacteremia.     She's been seen by ID.  Cardiology was c/s for SVT.     She's declined over the past few days. CT with multifocal pneumonia.    Assessment & Plan:   Goals of Care - Hannah [name] and family have had good conversations at length especially with Dr. [name] previously.  Plan is now for transitioning to comfort care and pursuing discharge home with hospice in place   Acute hypoxic respiratory failure Influenza A with MSSA pneumonia MSSA bacteremia Sepsis Repeat CT PE protocol 1/10 with multifocal pneumonia - tree in bud nodular opacities bilaterally, increased from prior exam.  Patchy airspace disease in mid to lower lung filed on L and RLL.  1/1 blood culture with staph aureus and abiotrophia defectiva (suspected contaminant, but follow follow up  culture) 1/3 blood culture [id number] Appreciate ID recommendations - continue ancef  while inpatient with goal of 7 days of IV abx and then transition to linezolid  600 mg PO BID with total of 4 weeks of treatment through 06/16/2024. Tamiflu  1/2-1/6 TTE without notable valvular abnormalities - no plan for TEE leukocytosis partially in setting of steroid use, but fever 1/10 and worsening leukocytosis 1/9.   She met criteria for sepsis 1/11 she was transitioned to cefepime  to cover for aspiration pneumonia/HAP.  Holding course with goc conversations. - now s/p GOC discussions, no further abx and focus will be comfort care   Acute Metabolic Encephalopathy Dementia VBG without hypercarbia B12, ammonia, folate all normal Suspect delirium - waxing and waning recently Minimize deliriogenic meds Delirium precautions   Anemia Suspected Hematoma posterior to Sacrum Drop from 9 -> 7.0 on 1/10  S/p 1 unit pRBC - hb is 8.3 today, more consistent with what I would expect after 1 unit pRBC Hyperdense collection posterior to sacrum in subcutaneous soft tissues measuring 4.5 x 1.6 x 1.2 cm Normal b12, folate.  Elevated ferritin, low iron . Heparin  currently on hold   Left Lower Extremity DVT On 1/8 US  Heparin  gtt on hold with drop in hb and suspected hematoma noted above, plus now focus is comfort/hospice    Intermittent SVT Short intermittent runs increased metop to 37.5 mg BID - will monitor Appreciate cardiology assistance - they've signed off 1/7 (see note)   AKI  Hyperkalemia Received lasix  1/10.  Also pRBC.  Sepsis above as well Improved today with IVF   Hypertension BP on low side Hold amlodipine    Concern for Poor UOP Urinary Retention Now s/p foley placement 1/9 - continue foley for comfort    Possible UTI No urine culture collected CT with bladder wall thickening, perivesicular fat stranding - infectious/inflammatory UA not suggestive of UTI - negative nitrites, negative  leukocytes, 0-5 RBC, 6-10 WBC, no bacteria UA at presentation with +LE/nitrites - watch for UTI symptoms - s/p abx    Concern for Possible Dysphagia Thrush SLP eval, appreciate assistance - continue pleasure feeding    HFpEF Echo 05/18/2024 revealed normal left ventricular ejection fraction, with grade 1 diastolic dysfunction Appears euvolemic to dry Cardiology now signed of 1/7   AKI Aki from presentation has improved   Left 2nd Toe Injury Plain films unremarkable Supportive care   Dementia Delirium precautions   T2DM Steroid induced hyperglycemia Steroids have been d/c'd   Loose watery diarrhea: + c diff antigen, negative PCR.  C diff ruled out with ID assistance, appreciate assistance - suspected abx associated diarrhea   Hypokalemia Hypomagnesemia Hypophosphatemia - repleted    Poor PO intake Severe Protein Calorie Malnutrition RD Hold megace   Body mass index is 17.22 kg/m. -Continue comfort care   Bilateral LE Pain Described as cramping, unclear cause on 1/9 Seems resolved today palpable bilateral pulses.  No notable masses or visible abnormalities.  Compartments soft.    Heat Rash Suspect this is rash on her bottom Will monitor, supportive care   Severe Protein Calorie Malnutrition Body mass index is 17.22 kg/m.   Concern regarding frequent blood draws.  S/p PICC placement 1/8   Principal Diagnosis: Sepsis due to pneumonia [facility])  Discharge Diagnoses: Active Hospital Problems   Diagnosis Date Noted   Goals of care, counseling/discussion 05/30/2024    Priority: High   End of life care 05/30/2024    Priority: High   Influenza A with pneumonia 05/18/2024    Priority: High   UTI (urinary tract infection) 05/18/2024    Priority: Medium    Iron  deficiency anemia 11/30/2023    Priority: Medium    CKD stage 3a, GFR 45-59 ml/min (HCC) 11/27/2023    Priority: Medium    Hypertension     Priority: Medium    Insulin  dependent type 2 diabetes  mellitus (HCC)     Priority: Medium    Chronic obstructive asthma (HCC) 04/19/2019    Priority: Medium    Hypothyroidism     Priority: Low   Protein-calorie malnutrition, severe 05/24/2024    Resolved Hospital Problems   Diagnosis Date Noted Date Resolved   Sepsis due to pneumonia (HCC) 05/17/2024 05/30/2024    Priority: High   Acute hypoxic respiratory failure (HCC) 05/18/2024 05/30/2024    Priority: High      Allergies as of 06/01/2024       Reactions   Penicillins Rash, Other (See Comments)   1980's   Sulfa Antibiotics Rash        Medication List     STOP taking these medications    amLODipine  2.5 MG tablet Commonly known as: NORVASC    arformoterol  15 MCG/2ML Nebu Commonly known as: BROVANA    B-12 SL   Baqsimi One Pack 3 MG/DOSE Powd Generic drug: Glucagon   benzonatate 100 MG capsule Commonly known as: TESSALON   Ca Carbonate-Mag Hydroxide 1000-200 MG Chew   CO Q 10 PO   diclofenac  sodium 1 % Gel Commonly known as: VOLTAREN   donepezil  5 MG tablet Commonly known as: ARICEPT    doxycycline  100 MG capsule Commonly known as: VIBRAMYCIN    ezetimibe -simvastatin  10-20 MG tablet Commonly known as: VYTORIN    FreeStyle Libre 14 Day Sensor Misc   furosemide  20 MG tablet Commonly known as: LASIX    Gemtesa 75 MG Tabs Generic drug: Vibegron   HYDROcodone -acetaminophen  5-325 MG tablet Commonly known as: NORCO/VICODIN   insulin  lispro 100 UNIT/ML KwikPen Commonly known as: HUMALOG   Januvia 50 MG tablet Generic drug: sitaGLIPtin   Kerendia  10 MG Tabs Generic drug: Finerenone    Lantus  SoloStar 100 UNIT/ML Solostar Pen Generic drug: insulin  glargine   levothyroxine  50 MCG tablet Commonly known as: SYNTHROID    lidocaine  2 % solution Commonly known as: XYLOCAINE    magnesium  oxide 400 (240 Mg) MG tablet Commonly known as: MAG-OX   megestrol  400 MG/10ML suspension Commonly known as: MEGACE    metoprolol  tartrate 25 MG tablet Commonly known  as: LOPRESSOR    omeprazole  20 MG capsule Commonly known as: PRILOSEC   ondansetron  4 MG tablet Commonly known as: ZOFRAN    oxybutynin  10 MG 24 hr tablet Commonly known as: DITROPAN -XL   predniSONE  10 MG tablet Commonly known as: DELTASONE    simethicone  80 MG chewable tablet Commonly known as: MYLICON   Vitamin D -3 125 MCG (5000 UT) Tabs       TAKE these medications    albuterol  (2.5 MG/3ML) 0.083% nebulizer solution Commonly known as: PROVENTIL  Take 3 mLs (2.5 mg total) by nebulization every 4 (four) hours as needed for wheezing or shortness of breath. What changed: Another medication with the same name was removed. Continue taking this medication, and follow the directions you see here.   budesonide  0.5 MG/2ML nebulizer solution Commonly known as: Pulmicort  Take 2 mLs (0.5 mg total) by nebulization in the morning and at bedtime.   latanoprost  0.005 % ophthalmic solution Commonly known as: XALATAN  Place 1 drop into both eyes at bedtime.   sodium chloride  flush 0.9 % Soln Commonly known as: NS 10-40 mLs by Intracatheter route as needed (flush).        Follow-up Information     [facility] Follow up.   Specialty: Hospice and Palliative Medicine Why: Home Hospice referral- they will follow for home hospice and DME needs to coordinate return home with Hospice Contact information: [address] [facility] [place]  [address] [phone]               Allergies[1]  Consultations: Palliative care  Procedures:   Discharge Exam: BP (!) 153/84 (BP Location: Left Wrist)   Pulse 97   Temp (!) 96.5 F (35.8 C) (Axillary)   Resp 16   Ht 5' (1.524 m)   Wt 40 kg   SpO2 98%   BMI 17.22 kg/m  Physical Exam Constitutional:      Appearance: Normal appearance.     Comments: Pleasant elderly woman resting in bed in no distress  HENT:     Head: Normocephalic and atraumatic.     Mouth/Throat:     Mouth: Mucous membranes are moist.  Eyes:      Extraocular Movements: Extraocular movements intact.  Cardiovascular:     Rate and Rhythm: Normal rate and regular rhythm.  Pulmonary:     Effort: Pulmonary effort is normal. No respiratory distress.     Breath sounds: Rhonchi present. No wheezing.  Abdominal:     General: Bowel sounds are normal. There is no distension.     Palpations: Abdomen is soft.     Tenderness:  There is no abdominal tenderness.  Musculoskeletal:        General: Normal range of motion.     Cervical back: Normal range of motion and neck supple.  Skin:    General: Skin is warm and dry.  Neurological:     Comments: Exam deferred given comfort care      The results of significant diagnostics from this hospitalization (including imaging, microbiology, ancillary and laboratory) are listed below for reference.   Microbiology: Recent Results (from the past 240 hours)  Culture, OB Urine     Status: None   Collection Time: 05/25/24  1:19 AM   Specimen: Urine, Random  Result Value Ref Range Status   Specimen Description URINE, RANDOM  Final   Special Requests NONE  Final   Culture   Final    NO GROWTH NO GROUP B STREP (S.AGALACTIAE) ISOLATED Performed at [facility], [address] [place], [place] [address]    Report Status 05/26/2024 FINAL  Final  MRSA Next Gen by PCR, Nasal     Status: None   Collection Time: 05/26/24  8:00 PM   Specimen: Nasal Mucosa; Nasal Swab  Result Value Ref Range Status   MRSA by PCR Next Gen NOT DETECTED NOT DETECTED Final    Comment: (NOTE) The GeneXpert MRSA Assay (FDA approved for NASAL specimens only), is one component of a comprehensive MRSA colonization surveillance program. It is not intended to diagnose MRSA infection nor to guide or monitor treatment for MRSA infections. Test performance is not FDA approved in patients less than 81 years old. Performed at [facility], [address] [place], [place] [address]      Labs: BNP (last 3 results) No  results for input(s): BNP in the last 8760 hours. Basic Metabolic Panel: Recent Labs  Lab 05/26/24 0210 05/27/24 0325 05/27/24 1514 05/28/24 0637 05/29/24 0616  NA 133* 135 137 139 140  K 5.0 5.6* 5.3* 4.9 4.7  CL 100 100 105 107 109  CO2 28 26 24 24 23   GLUCOSE 123* 109* 319* 228* 172*  BUN 19 24* 30* 30* 20  CREATININE 0.78 1.29* 1.48* 1.25* 0.90  CALCIUM  8.2* 8.5* 8.0* 8.3* 8.6*  MG 1.9 2.0  --  2.5* 2.2  PHOS 2.2* 3.1  --  4.2 2.8   Liver Function Tests: Recent Labs  Lab 05/26/24 0210 05/27/24 0325 05/29/24 0616  AST 33 43* 37  ALT 10 7 12   ALKPHOS 189* 53 75  BILITOT 0.3 0.5 0.4  PROT 4.5* 5.2* 5.5*  ALBUMIN 2.5* 2.5* 2.4*   No results for input(s): LIPASE, AMYLASE in the last 168 hours. Recent Labs  Lab 05/26/24 0210  AMMONIA 24   CBC: Recent Labs  Lab 05/26/24 0210 05/26/24 0917 05/26/24 1605 05/27/24 0325 05/28/24 0637 05/29/24 0616  WBC 23.7*  --   --  23.9* 17.0* 18.4*  NEUTROABS  --   --   --   --   --  13.8*  HGB 7.4* 7.6* 7.0* 10.9* 8.3* 8.6*  HCT 22.9* 22.8* 21.8* 31.7* 25.2* 25.8*  MCV 89.8  --   --  86.6 88.7 89.9  PLT 240  --   --  217 170 176   Cardiac Enzymes: No results for input(s): CKTOTAL, CKMB, CKMBINDEX, TROPONINI in the last 168 hours. BNP: Invalid input(s): POCBNP CBG: Recent Labs  Lab 05/28/24 2109 05/29/24 0636 05/29/24 0809 05/29/24 1204 05/30/24 2059  GLUCAP 83 157* 171* 150* 213*   D-Dimer  No results for input(s): DDIMER in the last 72 hours. Hgb A1c No results for input(s): HGBA1C in the last 72 hours. Lipid Profile No results for input(s): CHOL, HDL, LDLCALC, TRIG, CHOLHDL, LDLDIRECT in the last 72 hours. Thyroid  function studies No results for input(s): TSH, T4TOTAL, T3FREE, THYROIDAB in the last 72 hours.  Invalid input(s): FREET3 Anemia work up No results for input(s): VITAMINB12, FOLATE, FERRITIN, TIBC, IRON , RETICCTPCT in the last 72  hours. Urinalysis    Component Value Date/Time   COLORURINE YELLOW 05/27/2024 0750   APPEARANCEUR HAZY (A) 05/27/2024 0750   LABSPEC 1.040 (H) 05/27/2024 0750   PHURINE 6.0 05/27/2024 0750   GLUCOSEU 50 (A) 05/27/2024 0750   HGBUR SMALL (A) 05/27/2024 0750   BILIRUBINUR NEGATIVE 05/27/2024 0750   KETONESUR NEGATIVE 05/27/2024 0750   PROTEINUR 30 (A) 05/27/2024 0750   UROBILINOGEN 0.2 09/27/2012 1638   NITRITE NEGATIVE 05/27/2024 0750   LEUKOCYTESUR NEGATIVE 05/27/2024 0750   Sepsis Labs Recent Labs  Lab 05/26/24 0210 05/27/24 0325 05/28/24 0637 05/29/24 0616  WBC 23.7* 23.9* 17.0* 18.4*   Microbiology Recent Results (from the past 240 hours)  Culture, OB Urine     Status: None   Collection Time: 05/25/24  1:19 AM   Specimen: Urine, Random  Result Value Ref Range Status   Specimen Description URINE, RANDOM  Final   Special Requests NONE  Final   Culture   Final    NO GROWTH NO GROUP B STREP (S.AGALACTIAE) ISOLATED Performed at [facility], [address] [place], [place] [address]    Report Status 05/26/2024 FINAL  Final  MRSA Next Gen by PCR, Nasal     Status: None   Collection Time: 05/26/24  8:00 PM   Specimen: Nasal Mucosa; Nasal Swab  Result Value Ref Range Status   MRSA by PCR Next Gen NOT DETECTED NOT DETECTED Final    Comment: (NOTE) The GeneXpert MRSA Assay (FDA approved for NASAL specimens only), is one component of a comprehensive MRSA colonization surveillance program. It is not intended to diagnose MRSA infection nor to guide or monitor treatment for MRSA infections. Test performance is not FDA approved in patients less than 46 years old. Performed at [facility], [address] [place], [place] [address]     Procedures/Studies: CT Angio Chest Pulmonary Embolism (PE) W or WO Contrast Result Date: 05/26/2024 CLINICAL DATA:  Abdominal pain, acute, nonlocalized. Chronic dyspnea, chest wall or pleural disease suspected. EXAM: CT  ANGIOGRAPHY CHEST CT ABDOMEN AND PELVIS WITH CONTRAST TECHNIQUE: Multidetector CT imaging of the chest was performed using the standard protocol during bolus administration of intravenous contrast. Multiplanar CT image reconstructions and MIPs were obtained to evaluate the vascular anatomy. Multidetector CT imaging of the abdomen and pelvis was performed using the standard protocol during bolus administration of intravenous contrast. RADIATION DOSE REDUCTION: This exam was performed according to the departmental dose-optimization program which includes automated exposure control, adjustment of the mA and/or kV according to patient size and/or use of iterative reconstruction technique. CONTRAST:  75mL OMNIPAQUE  IOHEXOL  350 MG/ML SOLN COMPARISON:  05/23/2024. FINDINGS: CTA CHEST FINDINGS Cardiovascular: The heart is mildly enlarged and there is a trace pericardial effusion. There is atherosclerotic calcification of the aorta without evidence of aneurysm. The pulmonary trunk is normal in caliber. No evidence of pulmonary embolism is seen. Evaluation of the pulmonary arteries is limited due to respiratory motion artifact. Mediastinum/Nodes: Prominent lymph nodes are present in the mediastinum measuring up to 1.1 cm  in the subcarinal space. No axillary or mediastinal lymphadenopathy is seen. The thyroid  gland and esophagus are within normal limits. There is a small hiatal hernia. Lungs/Pleura: Paraseptal and centrilobular emphysematous changes are noted in the lungs. Pleural and parenchymal scarring is present at the right lung apex. Scattered tree-in-bud nodular opacities are present in the lungs bilaterally and increased from the prior exam. There is patchy airspace disease in the mid to lower lung field on the left and in the right lower lobe. No effusion or pneumothorax is seen. Musculoskeletal: Degenerative changes are present in the thoracic spine. No acute osseous abnormality is seen. Review of the MIP images  confirms the above findings. CT ABDOMEN and PELVIS FINDINGS Hepatobiliary: No focal liver abnormality is seen. No gallstones, gallbladder wall thickening, or biliary dilatation. Pancreas: Unremarkable. No pancreatic ductal dilatation or surrounding inflammatory changes. Spleen: Normal in size without focal abnormality. Adrenals/Urinary Tract: The adrenal glands are within normal limits. The kidneys enhance symmetrically. Subcentimeter hypodensities are present in the kidneys bilaterally which are too small to further characterize. No renal calculus or hydronephrosis bilaterally. A Foley catheter is present in the urinary bladder. There is mild bladder wall thickening with perivesicular fat stranding. Stomach/Bowel: There is a small hiatal hernia. No bowel obstruction, free air, or pneumatosis is seen. Few scattered diverticula are present along the colon without evidence of diverticulitis. A moderate amount of retained stool is present in the colon. Appendix is not seen. Vascular/Lymphatic: Aortic atherosclerosis. No enlarged abdominal or pelvic lymph nodes. Reproductive: Uterus and bilateral adnexa are unremarkable. Other: No abdominopelvic ascites. Musculoskeletal: Subcutaneous fat stranding and edema are noted in the lumbar region and right flank extending into the right hip and over the left hip, suggesting anasarca. There is a focal hyperdense region in the subcutaneous fat posterior to the sacrum measuring 4.5 x 1.6 x 1.2 cm with attenuation of 65 Hounsfield units. Degenerative changes are present in the lumbar spine. No acute fracture is seen. Review of the MIP images confirms the above findings. IMPRESSION: 1. No evidence of pulmonary embolism. 2. Scattered tree-in-bud nodular densities and airspace opacities in the mid to lower lung field on the left and right lower lobe, suspicious for multifocal pneumonia. 3. Emphysema. 4. Foley catheter in place with bladder wall thickening and perivesicular fat  stranding, possible infectious or inflammatory cystitis. Moderate amount of retained stool in the colon suggesting constipation. 5. Focal hyperdense collection posterior to the sacrum in the subcutaneous soft tissues measuring 4.5 x 1.6 x 1.2 cm, query hematoma. 6. Moderate amount of retained stool in the colon suggesting constipation. 7. Diverticulosis without diverticulitis. 8. Small hiatal hernia. 9. Aortic atherosclerosis. Electronically Signed   By: Leita Birmingham M.D.   On: 05/26/2024 18:53   CT ABDOMEN PELVIS W CONTRAST Result Date: 05/26/2024 CLINICAL DATA:  Abdominal pain, acute, nonlocalized. Chronic dyspnea, chest wall or pleural disease suspected. EXAM: CT ANGIOGRAPHY CHEST CT ABDOMEN AND PELVIS WITH CONTRAST TECHNIQUE: Multidetector CT imaging of the chest was performed using the standard protocol during bolus administration of intravenous contrast. Multiplanar CT image reconstructions and MIPs were obtained to evaluate the vascular anatomy. Multidetector CT imaging of the abdomen and pelvis was performed using the standard protocol during bolus administration of intravenous contrast. RADIATION DOSE REDUCTION: This exam was performed according to the departmental dose-optimization program which includes automated exposure control, adjustment of the mA and/or kV according to patient size and/or use of iterative reconstruction technique. CONTRAST:  75mL OMNIPAQUE  IOHEXOL  350 MG/ML SOLN COMPARISON:  05/23/2024.  FINDINGS: CTA CHEST FINDINGS Cardiovascular: The heart is mildly enlarged and there is a trace pericardial effusion. There is atherosclerotic calcification of the aorta without evidence of aneurysm. The pulmonary trunk is normal in caliber. No evidence of pulmonary embolism is seen. Evaluation of the pulmonary arteries is limited due to respiratory motion artifact. Mediastinum/Nodes: Prominent lymph nodes are present in the mediastinum measuring up to 1.1 cm in the subcarinal space. No axillary or  mediastinal lymphadenopathy is seen. The thyroid  gland and esophagus are within normal limits. There is a small hiatal hernia. Lungs/Pleura: Paraseptal and centrilobular emphysematous changes are noted in the lungs. Pleural and parenchymal scarring is present at the right lung apex. Scattered tree-in-bud nodular opacities are present in the lungs bilaterally and increased from the prior exam. There is patchy airspace disease in the mid to lower lung field on the left and in the right lower lobe. No effusion or pneumothorax is seen. Musculoskeletal: Degenerative changes are present in the thoracic spine. No acute osseous abnormality is seen. Review of the MIP images confirms the above findings. CT ABDOMEN and PELVIS FINDINGS Hepatobiliary: No focal liver abnormality is seen. No gallstones, gallbladder wall thickening, or biliary dilatation. Pancreas: Unremarkable. No pancreatic ductal dilatation or surrounding inflammatory changes. Spleen: Normal in size without focal abnormality. Adrenals/Urinary Tract: The adrenal glands are within normal limits. The kidneys enhance symmetrically. Subcentimeter hypodensities are present in the kidneys bilaterally which are too small to further characterize. No renal calculus or hydronephrosis bilaterally. A Foley catheter is present in the urinary bladder. There is mild bladder wall thickening with perivesicular fat stranding. Stomach/Bowel: There is a small hiatal hernia. No bowel obstruction, free air, or pneumatosis is seen. Few scattered diverticula are present along the colon without evidence of diverticulitis. A moderate amount of retained stool is present in the colon. Appendix is not seen. Vascular/Lymphatic: Aortic atherosclerosis. No enlarged abdominal or pelvic lymph nodes. Reproductive: Uterus and bilateral adnexa are unremarkable. Other: No abdominopelvic ascites. Musculoskeletal: Subcutaneous fat stranding and edema are noted in the lumbar region and right flank  extending into the right hip and over the left hip, suggesting anasarca. There is a focal hyperdense region in the subcutaneous fat posterior to the sacrum measuring 4.5 x 1.6 x 1.2 cm with attenuation of 65 Hounsfield units. Degenerative changes are present in the lumbar spine. No acute fracture is seen. Review of the MIP images confirms the above findings. IMPRESSION: 1. No evidence of pulmonary embolism. 2. Scattered tree-in-bud nodular densities and airspace opacities in the mid to lower lung field on the left and right lower lobe, suspicious for multifocal pneumonia. 3. Emphysema. 4. Foley catheter in place with bladder wall thickening and perivesicular fat stranding, possible infectious or inflammatory cystitis. Moderate amount of retained stool in the colon suggesting constipation. 5. Focal hyperdense collection posterior to the sacrum in the subcutaneous soft tissues measuring 4.5 x 1.6 x 1.2 cm, query hematoma. 6. Moderate amount of retained stool in the colon suggesting constipation. 7. Diverticulosis without diverticulitis. 8. Small hiatal hernia. 9. Aortic atherosclerosis. Electronically Signed   By: Leita Birmingham M.D.   On: 05/26/2024 18:53   DG CHEST PORT 1 VIEW Result Date: 05/25/2024 EXAM: 1 VIEW(S) XRAY OF THE CHEST 05/25/2024 12:32:00 PM COMPARISON: 05/20/2024 CLINICAL HISTORY: Cough FINDINGS: LINES, TUBES AND DEVICES: Right PICC with tip overlying the expected region of the superior cavoatrial junction. LUNGS AND PLEURA: Developing bibasilar patchy airspace opacities. Trace pleural effusions. No pneumothorax. HEART AND MEDIASTINUM: Atherosclerotic plaque. No acute abnormality  of the cardiac and mediastinal silhouettes. BONES AND SOFT TISSUES: No acute osseous abnormality. IMPRESSION: 1. Developing bibasilar patchy airspace opacities and trace pleural effusions. 2. Right PICC tip overlies the expected region of the superior cavoatrial junction. 3. Atherosclerotic vascular calcifications.  Electronically signed by: Greig Pique MD MD 05/25/2024 08:22 PM EST RP Workstation: [id number]   VAS US  LOWER EXTREMITY VENOUS (DVT) Result Date: 05/24/2024  Lower Venous DVT Study Patient Name:  Hannah [name]  Date of Exam:   05/24/2024 Medical Rec #: [id number]        Accession #:    [id number] Date of Birth: 17-Nov-1932        Patient Gender: F Patient Age:   32 years Exam Location:  [facility] Procedure:      VAS US  LOWER EXTREMITY VENOUS (DVT) Referring Phys: A POWELL JR --------------------------------------------------------------------------------  Indications: Edema, and SOB. Other Indications: Fatigue, weakness. Comparison Study: No prior exam. Performing Technologist: Edilia Elden Appl  Examination Guidelines: A complete evaluation includes B-mode imaging, spectral Doppler, color Doppler, and power Doppler as needed of all accessible portions of each vessel. Bilateral testing is considered an integral part of a complete examination. Limited examinations for reoccurring indications may be performed as noted. The reflux portion of the exam is performed with the patient in reverse Trendelenburg.  +-----+---------------+---------+-----------+----------+--------------+ RIGHTCompressibilityPhasicitySpontaneityPropertiesThrombus Aging +-----+---------------+---------+-----------+----------+--------------+ CFV  Full           Yes      Yes                                 +-----+---------------+---------+-----------+----------+--------------+ SFJ  Full           Yes      Yes                                 +-----+---------------+---------+-----------+----------+--------------+   +---------+---------------+---------+-----------+----------+--------------+ LEFT     CompressibilityPhasicitySpontaneityPropertiesThrombus Aging +---------+---------------+---------+-----------+----------+--------------+ CFV      Partial        Yes      Yes                                  +---------+---------------+---------+-----------+----------+--------------+ SFJ      Full           Yes      Yes                                 +---------+---------------+---------+-----------+----------+--------------+ FV Prox  None           No       No                                  +---------+---------------+---------+-----------+----------+--------------+ FV Mid   None           No       No                                  +---------+---------------+---------+-----------+----------+--------------+ FV DistalNone           No       No                                  +---------+---------------+---------+-----------+----------+--------------+  PFV      Partial        Yes      Yes                                 +---------+---------------+---------+-----------+----------+--------------+ POP      Full           Yes      Yes                                 +---------+---------------+---------+-----------+----------+--------------+ PTV      Full                                                        +---------+---------------+---------+-----------+----------+--------------+ PERO     Full                                                        +---------+---------------+---------+-----------+----------+--------------+ Deep vein thrombosis noted in the distal common femoral at the bifurcation of the proximal femoral and profunda, the profunda vein, the proximal, middle and distal femoral vein. Cystic structure noted in the left popliteal fossa measuring 2.5 x06 x 1.7 cm.    Summary: RIGHT: - No evidence of common femoral vein obstruction.   LEFT: - Findings consistent with acute deep vein thrombosis involving the left common femoral vein, left femoral vein, and left proximal profunda vein.  - A cystic structure is found in the popliteal fossa.  *See table(s) above for measurements and observations. Electronically signed by Debby Robertson on 05/24/2024 at 8:14:01 PM.     Final    DG Toe 2nd Left Result Date: 05/24/2024 EXAM: 1 VIEW(S) XRAY OF THE LATERALITY TOES 05/24/2024 11:46:00 AM COMPARISON: None available. CLINICAL HISTORY: Pain 144615 FINDINGS: BONES AND JOINTS: No acute fracture. No malalignment. SOFT TISSUES: Unremarkable. No radiopaque foreign body. IMPRESSION: 1. No acute fracture or dislocation. Electronically signed by: Rogelia Myers MD MD 05/24/2024 06:05 PM EST RP Workstation: [id number]   US  EKG SITE RITE Result Date: 05/24/2024 If Site Rite image not attached, placement could not be confirmed due to current cardiac rhythm.  CT CHEST ABDOMEN PELVIS WO CONTRAST Result Date: 05/23/2024 EXAM: CT CHEST, ABDOMEN AND PELVIS WITHOUT CONTRAST 05/23/2024 05:09:22 PM TECHNIQUE: CT of the chest, abdomen and pelvis was performed without the administration of intravenous contrast. Multiplanar reformatted images are provided for review. Automated exposure control, iterative reconstruction, and/or weight based adjustment of the mA/kV was utilized to reduce the radiation dose to as low as reasonably achievable. COMPARISON: Chest CT 05/17/2024. CT angiogram abdomen and pelvis 11/29/2023. CLINICAL HISTORY: Pneumonia, complication suspected, xray done; abdomen pain. FINDINGS: CHEST: MEDIASTINUM AND LYMPH NODES: Heart and pericardium are unremarkable. Coronary and aortic atherosclerotic calcifications are noted. The central airways are clear. No mediastinal, hilar or axillary lymphadenopathy. LUNGS AND PLEURA: Stable scarring in the right lung apex. Tree in bud opacities in the lingula and ill defined ground glass/nodular opacities in the mid left upper lobe have mildly decreased, but have not completely resolved. Linear areas of scarring in the  inferior right upper lobe persist. No new focal lung infiltrate. No pleural effusion. No pneumothorax. ABDOMEN AND PELVIS: LIVER: Unremarkable. GALLBLADDER AND BILE DUCTS: Small gallstone is present. No biliary ductal dilatation.  SPLEEN: No acute abnormality. PANCREAS: No acute abnormality. ADRENAL GLANDS: No acute abnormality. KIDNEYS, URETERS AND BLADDER: No stones in the kidneys or ureters. No hydronephrosis. No perinephric or periureteral stranding. Urinary bladder is unremarkable. GI AND BOWEL: Stomach demonstrates no acute abnormality. There is a large amount of stool throughout the colon. There is sigmoid colon diverticulosis. The appendix is not visualized. There is no bowel obstruction. REPRODUCTIVE ORGANS: No acute abnormality. PERITONEUM AND RETROPERITONEUM: No ascites. No free air. VASCULATURE: Aorta is normal in caliber. There are atherosclerotic calcifications of the aorta. ABDOMINAL AND PELVIS LYMPH NODES: No lymphadenopathy. BONES AND SOFT TISSUES: The bones are diffusely osteopenic. Changes are seen throughout the lumbar spine. Mild subcutaneous edema lateral to both hips. Tiny focus of subcutaneous air in the lower anterior abdominal wall may represent medication injection site. IMPRESSION: 1. Tree-in-bud opacities in the lingula and ill-defined ground glass/nodular opacities in the mid left upper lobe have mildly decreased but have not completely resolved, with no new focal lung infiltrate. 2. No acute findings in abdomen or pelvis. 3. Sigmoid colon diverticulosis without evidence of diverticulitis. Electronically signed by: Greig Pique MD MD 05/23/2024 07:43 PM EST RP Workstation: [id number]   DG Abd 1 View Result Date: 05/22/2024 EXAM: 1 VIEW XRAY OF THE ABDOMEN 05/22/2024 11:26:00 AM COMPARISON: CT abdomen and pelvis 11/29/2023. CLINICAL HISTORY: Abdominal distention. FINDINGS: BOWEL: Gas and stool throughout the colon. No small or large bowel distention. This indicates a nonobstructive bowel gas pattern. SOFT TISSUES: No radiopaque stones. Soft tissue contours appear intact. BONES: Degenerative changes in the spine and hips. No acute fracture. LUNG BASES: Lung bases are clear. IMPRESSION: 1. Nonobstructive bowel gas  pattern with gas and stool throughout the colon. Electronically signed by: Elsie Gravely MD 05/22/2024 08:21 PM EST RP Workstation: [id number]   DG CHEST PORT 1 VIEW Result Date: 05/20/2024 EXAM: 1 VIEW(S) XRAY OF THE CHEST 05/20/2024 12:14:01 PM COMPARISON: 05/17/2024 CLINICAL HISTORY: SOB (shortness of breath) FINDINGS: LUNGS AND PLEURA: Chronic coarsened interstitial markings without pulmonary edema. Bronchial wall thickening and reticulonodular opacities in the lower lungs. No pleural effusion. No pneumothorax. HEART AND MEDIASTINUM: Atherosclerotic plaque noted. No acute abnormality of the cardiac and mediastinal silhouettes. BONES AND SOFT TISSUES: No acute osseous abnormality. IMPRESSION: 1. Findings compatible with small airway infection/inflammation similar to CT 1 / 1 / 26 . Electronically signed by: Norman Gatlin MD 05/20/2024 12:37 PM EST RP Workstation: [id number]   ECHOCARDIOGRAM COMPLETE Result Date: 05/18/2024    ECHOCARDIOGRAM REPORT   Patient Name:   Hannah [name] Date of Exam: 05/18/2024 Medical Rec #:  [id number]       Height:       60.0 in Accession #:    [id number]      Weight:       91.5 lb Date of Birth:  [birth year]       BSA:          1.339 m Patient Age:    89 years        BP:           152/93 mmHg Patient Gender: F               HR:           144 bpm. Exam Location:  Inpatient Procedure: 2D Echo, Cardiac Doppler  and Color Doppler (Both Spectral and Color            Flow Doppler were utilized during procedure). Indications:    CHF- Acute Systolic  History:        Patient has prior history of Echocardiogram examinations, most                 recent 05/04/2024. Risk Factors:Hypertension and Diabetes.  Sonographer:    Sherlean Dubin Referring Phys: [id number] SUBRINA SUNDIL  Sonographer Comments: Image acquisition challenging due to respiratory motion. IMPRESSIONS  1. Left ventricular ejection fraction, by estimation, is 60 to 65%. The left ventricle has normal function. The left ventricle  has no regional wall motion abnormalities. Left ventricular diastolic parameters are consistent with Grade I diastolic dysfunction (impaired relaxation).  2. Right ventricular systolic function is normal. The right ventricular size is normal.  3. The mitral valve is normal in structure. Mild mitral valve regurgitation. No evidence of mitral stenosis.  4. The aortic valve is tricuspid. Aortic valve regurgitation is not visualized. No aortic stenosis is present.  5. The inferior vena cava is normal in size with greater than 50% respiratory variability, suggesting right atrial pressure of 3 mmHg. FINDINGS  Left Ventricle: Left ventricular ejection fraction, by estimation, is 60 to 65%. The left ventricle has normal function. The left ventricle has no regional wall motion abnormalities. The left ventricular internal cavity size was normal in size. There is  no left ventricular hypertrophy. Left ventricular diastolic parameters are consistent with Grade I diastolic dysfunction (impaired relaxation). Right Ventricle: The right ventricular size is normal. No increase in right ventricular wall thickness. Right ventricular systolic function is normal. Left Atrium: Left atrial size was normal in size. Right Atrium: Right atrial size was normal in size. Pericardium: There is no evidence of pericardial effusion. Mitral Valve: The mitral valve is normal in structure. Mild mitral valve regurgitation. No evidence of mitral valve stenosis. Tricuspid Valve: The tricuspid valve is normal in structure. Tricuspid valve regurgitation is not demonstrated. No evidence of tricuspid stenosis. Aortic Valve: The aortic valve is tricuspid. Aortic valve regurgitation is not visualized. No aortic stenosis is present. Pulmonic Valve: The pulmonic valve was normal in structure. Pulmonic valve regurgitation is not visualized. No evidence of pulmonic stenosis. Aorta: The aortic root is normal in size and structure. Venous: The inferior vena cava is  normal in size with greater than 50% respiratory variability, suggesting right atrial pressure of 3 mmHg. IAS/Shunts: No atrial level shunt detected by color flow Doppler.  LEFT VENTRICLE PLAX 2D LVIDd:         3.70 cm     Diastology LVIDs:         2.70 cm     LV e' medial:    8.55 cm/s LV PW:         0.90 cm     LV E/e' medial:  7.4 LV IVS:        0.80 cm     LV e' lateral:   5.28 cm/s LVOT diam:     2.00 cm     LV E/e' lateral: 12.0 LV SV:         36 LV SV Index:   27 LVOT Area:     3.14 cm LV IVRT:       137 msec  LV Volumes (MOD) LV vol d, MOD A2C: 40.7 ml LV vol d, MOD A4C: 32.1 ml LV vol s, MOD A2C: 18.3 ml LV vol s, MOD  A4C: 11.4 ml LV SV MOD A2C:     22.4 ml LV SV MOD A4C:     32.1 ml LV SV MOD BP:      22.4 ml RIGHT VENTRICLE             IVC RV Basal diam:  2.70 cm     IVC diam: 0.90 cm RV Mid diam:    2.40 cm RV S prime:     12.60 cm/s TAPSE (M-mode): 1.4 cm LEFT ATRIUM             Index        RIGHT ATRIUM          Index LA diam:        2.10 cm 1.57 cm/m   RA Area:     6.18 cm LA Vol (A2C):   15.5 ml 11.58 ml/m  RA Volume:   10.80 ml 8.07 ml/m LA Vol (A4C):   13.0 ml 9.71 ml/m LA Biplane Vol: 15.6 ml 11.65 ml/m  AORTIC VALVE LVOT Vmax:   65.90 cm/s LVOT Vmean:  40.100 cm/s LVOT VTI:    0.115 m  AORTA Ao Root diam: 2.80 cm Ao Asc diam:  2.70 cm MITRAL VALVE               TRICUSPID VALVE MV Area (PHT): 4.80 cm    TR Peak grad:   6.4 mmHg MV Decel Time: 158 msec    TR Vmax:        126.00 cm/s MR Peak grad: 54.3 mmHg MR Vmax:      368.50 cm/s  SHUNTS MV E velocity: 63.10 cm/s  Systemic VTI:  0.12 m MV A velocity: 68.40 cm/s  Systemic Diam: 2.00 cm MV E/A ratio:  0.92 Morene Brownie Electronically signed by Morene Brownie Signature Date/Time: 05/18/2024/4:50:12 PM    Final    CT Chest Wo Contrast Result Date: 05/17/2024 CLINICAL DATA:  Shortness of breath and cough. EXAM: CT CHEST WITHOUT CONTRAST TECHNIQUE: Multidetector CT imaging of the chest was performed following the standard protocol without  IV contrast. RADIATION DOSE REDUCTION: This exam was performed according to the departmental dose-optimization program which includes automated exposure control, adjustment of the mA and/or kV according to patient size and/or use of iterative reconstruction technique. COMPARISON:  Chest CT dated 04/09/2024. FINDINGS: Evaluation of this exam is limited in the absence of intravenous contrast. Cardiovascular: There is no cardiomegaly. Trace pericardial effusion. There is mild atherosclerotic calcification of the thoracic aorta. No aneurysmal dilatation. The central pulmonary arteries are grossly unremarkable. Mediastinum/Nodes: No hilar or mediastinal adenopathy. The esophagus is grossly unremarkable. No mediastinal fluid collection. Lungs/Pleura: Linear scarring in the right middle lobe appear scattered clusters of ground-glass nodular densities with tree-in-bud appearance primarily in the lingula and to a lesser degree in the left lower lobe may be residual from prior infectious process or represent recurrent atypical infection. No consolidative changes. There is no pleural effusion or pneumothorax. Right apical scarring. 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 The central airways are patent. Upper Abdomen: Gallstones. Musculoskeletal: Osteopenia.  No acute osseous pathology. IMPRESSION: 1. Scattered clusters of ground-glass nodular densities with tree-in-bud appearance primarily in the lingula and to a lesser degree in the left lower lobe may be residual from prior infectious process or represent recurrent atypical infection. 2. Cholelithiasis. 3.  Aortic Atherosclerosis (ICD10-I70.0). Electronically Signed   By: Vanetta Chou M.D.   On: 05/17/2024 18:35   DG Chest Port 1 View Result Date: 05/17/2024 CLINICAL DATA:  Chest pain. EXAM: PORTABLE CHEST 1 VIEW COMPARISON:  04/09/2024 radiographs and CT FINDINGS: The heart is normal in size. Mediastinal contours are stable with aortic atherosclerosis. Chronic hyperinflation and  bronchial thickening. Improvement in bibasilar aeration from prior. No progressive airspace disease. Right costophrenic angle not included in the field of view. No pneumothorax or large pleural effusion. On limited assessment, no acute osseous findings. IMPRESSION: Chronic hyperinflation and bronchial thickening. Improvement in bibasilar aeration from prior. No progressive airspace disease. Electronically Signed   By: Andrea Gasman M.D.   On: 05/17/2024 17:53   ECHOCARDIOGRAM COMPLETE Result Date: 05/05/2024    ECHOCARDIOGRAM REPORT   Patient Name:   Hannah [name] Date of Exam: 05/04/2024 Medical Rec #:  [id number]       Height:       61.0 in Accession #:    [id number]      Weight:       99.0 lb Date of Birth:  1933-04-04       BSA:          1.401 m Patient Age:    89 years        BP:           139/67 mmHg Patient Gender: F               HR:           94 bpm. Exam Location:  [address] Procedure: 2D Echo, Cardiac Doppler and Color Doppler (Both Spectral and Color            Flow Doppler were utilized during procedure). Indications:    Dyspnea R06.00  History:        Patient has prior history of Echocardiogram examinations, most                 recent 03/01/2021. Risk Factors:Hypertension and Diabetes.  Sonographer:    Augustin Seals RDCS Referring Phys: [phone] JENNIFER C WOODY IMPRESSIONS  1. Left ventricular ejection fraction, by estimation, is 60 to 65%. Left ventricular ejection fraction by 3D volume is 60 %. The left ventricle has normal function. The left ventricle has no regional wall motion abnormalities. Left ventricular diastolic  parameters are consistent with Grade I diastolic dysfunction (impaired relaxation).  2. Right ventricular systolic function is normal. The right ventricular size is normal. Tricuspid regurgitation signal is inadequate for assessing PA pressure.  3. The mitral valve is degenerative. Mild to moderate mitral valve regurgitation. No evidence of mitral stenosis.  4. The  aortic valve is grossly normal. There is mild calcification of the aortic valve. Aortic valve regurgitation is trivial. No aortic stenosis is present.  5. The inferior vena cava is normal in size with greater than 50% respiratory variability, suggesting right atrial pressure of 3 mmHg. FINDINGS  Left Ventricle: Left ventricular ejection fraction, by estimation, is 60 to 65%. Left ventricular ejection fraction by 3D volume is 60 %. The left ventricle has normal function. The left ventricle has no regional wall motion abnormalities. The left ventricular internal cavity size was normal in size. There is no left ventricular hypertrophy. Left ventricular diastolic parameters are consistent with Grade I diastolic dysfunction (impaired relaxation). Right Ventricle: The right ventricular size is normal. No increase in right ventricular wall thickness. Right ventricular systolic function is normal. Tricuspid regurgitation signal is inadequate for assessing PA pressure. Left Atrium: Left atrial size was normal in size. Right Atrium: Right atrial size was normal in size. Pericardium: Trivial pericardial effusion is present. Presence  of epicardial fat layer. Mitral Valve: The mitral valve is degenerative in appearance. Mild to moderate mitral valve regurgitation. No evidence of mitral valve stenosis. Tricuspid Valve: The tricuspid valve is normal in structure. Tricuspid valve regurgitation is trivial. No evidence of tricuspid stenosis. Aortic Valve: The aortic valve is grossly normal. There is mild calcification of the aortic valve. Aortic valve regurgitation is trivial. No aortic stenosis is present. Pulmonic Valve: The pulmonic valve was normal in structure. Pulmonic valve regurgitation is mild. No evidence of pulmonic stenosis. Aorta: The aortic root is normal in size and structure. Venous: The inferior vena cava is normal in size with greater than 50% respiratory variability, suggesting right atrial pressure of 3 mmHg.  IAS/Shunts: No atrial level shunt detected by color flow Doppler. Additional Comments: 3D was performed not requiring image post processing on an independent workstation and was normal.  LEFT VENTRICLE PLAX 2D LVIDd:         3.80 cm         Diastology LVIDs:         2.20 cm         LV e' medial:    5.22 cm/s LV PW:         0.80 cm         LV E/e' medial:  12.2 LV IVS:        0.90 cm         LV e' lateral:   6.53 cm/s LVOT diam:     1.90 cm         LV E/e' lateral: 9.8 LV SV:         40 LV SV Index:   28 LVOT Area:     2.84 cm        3D Volume EF                                LV 3D EF:    Left                                             ventricul                                             ar                                             ejection                                             fraction                                             by 3D  volume is                                             60 %.                                 3D Volume EF:                                3D EF:        60 %                                LV EDV:       63 ml                                LV ESV:       25 ml                                LV SV:        38 ml RIGHT VENTRICLE             IVC RV Basal diam:  2.60 cm     IVC diam: 1.40 cm RV Mid diam:    1.90 cm RV S prime:     10.40 cm/s  PULMONARY VEINS TAPSE (M-mode): 1.3 cm      A Reversal Velocity: 36.20 cm/s                             Diastolic Velocity:  51.40 cm/s                             S/D Velocity:        1.10                             Systolic Velocity:   56.60 cm/s LEFT ATRIUM           Index       RIGHT ATRIUM          Index LA diam:      2.30 cm 1.64 cm/m  RA Area:     6.90 cm LA Vol (A2C): 10.4 ml 7.42 ml/m  RA Volume:   10.40 ml 7.42 ml/m LA Vol (A4C): 11.7 ml 8.35 ml/m  AORTIC VALVE LVOT Vmax:   75.50 cm/s LVOT Vmean:  49.800 cm/s LVOT VTI:    0.140 m  AORTA Ao Root diam: 2.70 cm Ao Asc diam:  2.40 cm  MITRAL VALVE MV Area (PHT): 4.57 cm    SHUNTS MV Decel Time: 166 msec    Systemic VTI:  0.14 m MR Peak grad: 174.2 mmHg   Systemic Diam: 1.90 cm MR Vmax:      660.00 cm/s MV E velocity: 63.90 cm/s MV A velocity: 95.40 cm/s MV E/A ratio:  0.67 Soyla Merck MD Electronically signed by Soyla Merck MD Signature Date/Time: 05/05/2024/3:05:52 PM  Final      Time coordinating discharge: Over 30 minutes    Alm Apo, MD  [facility] 06/01/2024, 2:19 PM     [1]  Allergies Allergen Reactions   Penicillins Rash and Other (See Comments)    1980's   Sulfa Antibiotics Rash   "

## 2024-06-01 NOTE — Progress Notes (Signed)
 Pt discharged home with PTAR, foley and PICC line in place per MD order.

## 2024-06-01 NOTE — Progress Notes (Signed)
 Hannah [name] [phone] [facility] Liaison Note  Received request from [facility] for hospice services at home after discharge. Met with her daughters to initiate education related to hospice philosophy, services, and team approach to care. Family verbalized understanding of information given. Per discussion, the plan is for discharge home by [facility] on [phone].   DME needs discussed. Family requests the following equipment for delivery: hospital bed and over bed table and oxygen.  The address has been verified and is correct in the chart. [place] is the family contact to arrange time of equipment delivery.  Please send signed and completed DNR home with patient/family. Please provide prescriptions at discharge as needed to ensure ongoing symptom management.  [facility] information and contact numbers given to [place]. Above information shared with ICM and MD. Please call with any questions or concerns.   Thank you for the opportunity to participate in this patient's care.   Amy Darien BSN, RN [facility] Liaison [phone]

## 2024-06-01 NOTE — Care Management Important Message (Signed)
 Important Message  Patient Details  Name: IVYONNA HOELZEL MRN: [id number] Date of Birth: 1932/08/05   Important Message Given:  Yes - Medicare IM     Vonzell Arrie Sharps 06/01/2024, 10:37 AM

## 2024-06-13 ENCOUNTER — Inpatient Hospital Stay: Admitting: Internal Medicine

## 2024-06-14 ENCOUNTER — Other Ambulatory Visit: Payer: Self-pay

## 2024-06-17 DEATH — deceased

## 2024-08-10 ENCOUNTER — Ambulatory Visit (INDEPENDENT_AMBULATORY_CARE_PROVIDER_SITE_OTHER): Admitting: Otolaryngology
